# Patient Record
Sex: Male | Born: 1935 | Race: White | Hispanic: No | Smoking: Former smoker
Health system: Southern US, Community
[De-identification: ages and names within clinical notes are randomized; demographics above are authoritative.]

## PROBLEM LIST (undated history)

## (undated) DIAGNOSIS — I10 Essential (primary) hypertension: Secondary | ICD-10-CM

## (undated) DIAGNOSIS — J189 Pneumonia, unspecified organism: Secondary | ICD-10-CM

## (undated) DIAGNOSIS — M47816 Spondylosis without myelopathy or radiculopathy, lumbar region: Secondary | ICD-10-CM

## (undated) DIAGNOSIS — R32 Unspecified urinary incontinence: Secondary | ICD-10-CM

## (undated) DIAGNOSIS — I714 Abdominal aortic aneurysm, without rupture, unspecified: Secondary | ICD-10-CM

## (undated) DIAGNOSIS — M1990 Unspecified osteoarthritis, unspecified site: Secondary | ICD-10-CM

## (undated) DIAGNOSIS — I701 Atherosclerosis of renal artery: Secondary | ICD-10-CM

## (undated) DIAGNOSIS — E785 Hyperlipidemia, unspecified: Secondary | ICD-10-CM

## (undated) DIAGNOSIS — I219 Acute myocardial infarction, unspecified: Secondary | ICD-10-CM

## (undated) DIAGNOSIS — J449 Chronic obstructive pulmonary disease, unspecified: Secondary | ICD-10-CM

## (undated) DIAGNOSIS — K219 Gastro-esophageal reflux disease without esophagitis: Secondary | ICD-10-CM

## (undated) DIAGNOSIS — I739 Peripheral vascular disease, unspecified: Secondary | ICD-10-CM

## (undated) DIAGNOSIS — I350 Nonrheumatic aortic (valve) stenosis: Secondary | ICD-10-CM

## (undated) DIAGNOSIS — I2510 Atherosclerotic heart disease of native coronary artery without angina pectoris: Secondary | ICD-10-CM

## (undated) HISTORY — DX: Nonrheumatic aortic (valve) stenosis: I35.0

## (undated) HISTORY — DX: Essential (primary) hypertension: I10

## (undated) HISTORY — DX: Atherosclerosis of renal artery: I70.1

## (undated) HISTORY — DX: Acute myocardial infarction, unspecified: I21.9

## (undated) HISTORY — DX: Abdominal aortic aneurysm, without rupture, unspecified: I71.40

## (undated) HISTORY — DX: Gastro-esophageal reflux disease without esophagitis: K21.9

## (undated) HISTORY — DX: Hyperlipidemia, unspecified: E78.5

## (undated) HISTORY — DX: Chronic obstructive pulmonary disease, unspecified: J44.9

## (undated) HISTORY — DX: Atherosclerotic heart disease of native coronary artery without angina pectoris: I25.10

## (undated) HISTORY — DX: Spondylosis without myelopathy or radiculopathy, lumbar region: M47.816

## (undated) HISTORY — PX: CATARACT EXTRACTION W/ INTRAOCULAR LENS  IMPLANT, BILATERAL: SHX1307

## (undated) HISTORY — DX: Peripheral vascular disease, unspecified: I73.9

## (undated) HISTORY — DX: Abdominal aortic aneurysm, without rupture: I71.4

## (undated) HISTORY — DX: Unspecified urinary incontinence: R32

---
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
## 1966-04-10 HISTORY — PX: VASECTOMY: SHX75

## 1996-04-10 DIAGNOSIS — I219 Acute myocardial infarction, unspecified: Secondary | ICD-10-CM

## 1996-04-10 HISTORY — DX: Acute myocardial infarction, unspecified: I21.9

## 1996-05-11 HISTORY — PX: CORONARY ANGIOPLASTY WITH STENT PLACEMENT: SHX49

## 2002-04-11 ENCOUNTER — Ambulatory Visit (HOSPITAL_COMMUNITY): Admission: RE | Admit: 2002-04-11 | Discharge: 2002-04-11 | Payer: Self-pay | Admitting: Cardiology

## 2002-12-16 ENCOUNTER — Encounter: Payer: Self-pay | Admitting: Emergency Medicine

## 2002-12-16 ENCOUNTER — Encounter: Admission: RE | Admit: 2002-12-16 | Discharge: 2002-12-16 | Payer: Self-pay | Admitting: Emergency Medicine

## 2004-03-11 ENCOUNTER — Ambulatory Visit: Payer: Self-pay | Admitting: Cardiology

## 2004-05-03 ENCOUNTER — Ambulatory Visit: Payer: Self-pay | Admitting: Cardiology

## 2005-04-12 ENCOUNTER — Ambulatory Visit: Payer: Self-pay | Admitting: Cardiology

## 2005-04-18 ENCOUNTER — Ambulatory Visit: Payer: Self-pay

## 2005-04-18 ENCOUNTER — Ambulatory Visit: Payer: Self-pay | Admitting: Cardiology

## 2006-04-13 ENCOUNTER — Ambulatory Visit: Payer: Self-pay | Admitting: Cardiology

## 2006-05-02 ENCOUNTER — Encounter: Payer: Self-pay | Admitting: Cardiovascular Disease

## 2006-05-02 ENCOUNTER — Ambulatory Visit: Payer: Self-pay

## 2007-04-22 ENCOUNTER — Ambulatory Visit: Payer: Self-pay | Admitting: Cardiology

## 2007-05-10 ENCOUNTER — Ambulatory Visit: Payer: Self-pay | Admitting: Cardiology

## 2007-05-10 ENCOUNTER — Ambulatory Visit: Payer: Self-pay

## 2007-05-10 LAB — CONVERTED CEMR LAB
ALT: 24 units/L (ref 0–53)
AST: 26 units/L (ref 0–37)
Albumin: 3.8 g/dL (ref 3.5–5.2)
Alkaline Phosphatase: 67 units/L (ref 39–117)
BUN: 23 mg/dL (ref 6–23)
Bilirubin, Direct: 0.1 mg/dL (ref 0.0–0.3)
CO2: 28 meq/L (ref 19–32)
Calcium: 9.4 mg/dL (ref 8.4–10.5)
Chloride: 110 meq/L (ref 96–112)
Cholesterol: 118 mg/dL (ref 0–200)
Creatinine, Ser: 1 mg/dL (ref 0.4–1.5)
GFR calc Af Amer: 94 mL/min
GFR calc non Af Amer: 78 mL/min
Glucose, Bld: 106 mg/dL — ABNORMAL HIGH (ref 70–99)
HDL: 40 mg/dL (ref >39.0)
LDL Cholesterol: 66 mg/dL (ref 0–99)
Potassium: 4.6 meq/L (ref 3.5–5.1)
Sodium: 142 meq/L (ref 135–145)
Total Bilirubin: 0.8 mg/dL (ref 0.3–1.2)
Total CHOL/HDL Ratio: 3
Total Protein: 6.8 g/dL (ref 6.0–8.3)
Triglycerides: 61 mg/dL (ref 0–149)
VLDL: 12 mg/dL (ref 0–40)

## 2008-05-25 ENCOUNTER — Ambulatory Visit: Payer: Self-pay

## 2008-05-25 ENCOUNTER — Encounter: Payer: Self-pay | Admitting: Cardiology

## 2008-05-25 ENCOUNTER — Ambulatory Visit: Payer: Self-pay | Admitting: Cardiology

## 2008-05-25 LAB — CONVERTED CEMR LAB
ALT: 29 units/L (ref 0–53)
AST: 31 units/L (ref 0–37)
Albumin: 3.8 g/dL (ref 3.5–5.2)
Alkaline Phosphatase: 57 units/L (ref 39–117)
BUN: 20 mg/dL (ref 6–23)
Bilirubin, Direct: 0.1 mg/dL (ref 0.0–0.3)
CO2: 27 meq/L (ref 19–32)
Calcium: 9.3 mg/dL (ref 8.4–10.5)
Chloride: 110 meq/L (ref 96–112)
Cholesterol: 114 mg/dL (ref 0–200)
Creatinine, Ser: 1 mg/dL (ref 0.4–1.5)
GFR calc Af Amer: 94 mL/min
GFR calc non Af Amer: 77 mL/min
Glucose, Bld: 108 mg/dL — ABNORMAL HIGH (ref 70–99)
HDL: 31.5 mg/dL — ABNORMAL LOW (ref >39.0)
LDL Cholesterol: 64 mg/dL (ref 0–99)
Potassium: 4.2 meq/L (ref 3.5–5.1)
Sodium: 144 meq/L (ref 135–145)
Total Bilirubin: 0.9 mg/dL (ref 0.3–1.2)
Total CHOL/HDL Ratio: 3.6
Total Protein: 6.7 g/dL (ref 6.0–8.3)
Triglycerides: 94 mg/dL (ref 0–149)
VLDL: 19 mg/dL (ref 0–40)

## 2008-06-01 ENCOUNTER — Encounter: Payer: Self-pay | Admitting: Cardiology

## 2008-06-01 ENCOUNTER — Ambulatory Visit: Payer: Self-pay

## 2008-08-31 ENCOUNTER — Emergency Department (HOSPITAL_COMMUNITY): Admission: EM | Admit: 2008-08-31 | Discharge: 2008-09-01 | Payer: Self-pay | Admitting: Emergency Medicine

## 2008-10-07 ENCOUNTER — Telehealth (INDEPENDENT_AMBULATORY_CARE_PROVIDER_SITE_OTHER): Payer: Self-pay | Admitting: *Deleted

## 2009-02-24 ENCOUNTER — Encounter (INDEPENDENT_AMBULATORY_CARE_PROVIDER_SITE_OTHER): Payer: Self-pay | Admitting: *Deleted

## 2009-05-18 DIAGNOSIS — I10 Essential (primary) hypertension: Secondary | ICD-10-CM | POA: Insufficient documentation

## 2009-05-18 DIAGNOSIS — I701 Atherosclerosis of renal artery: Secondary | ICD-10-CM | POA: Insufficient documentation

## 2009-05-18 DIAGNOSIS — R0609 Other forms of dyspnea: Secondary | ICD-10-CM | POA: Insufficient documentation

## 2009-05-18 DIAGNOSIS — E785 Hyperlipidemia, unspecified: Secondary | ICD-10-CM | POA: Insufficient documentation

## 2009-05-18 DIAGNOSIS — J40 Bronchitis, not specified as acute or chronic: Secondary | ICD-10-CM | POA: Insufficient documentation

## 2009-05-18 DIAGNOSIS — R0600 Dyspnea, unspecified: Secondary | ICD-10-CM | POA: Insufficient documentation

## 2009-05-18 DIAGNOSIS — K219 Gastro-esophageal reflux disease without esophagitis: Secondary | ICD-10-CM | POA: Insufficient documentation

## 2009-05-18 DIAGNOSIS — I2510 Atherosclerotic heart disease of native coronary artery without angina pectoris: Secondary | ICD-10-CM | POA: Insufficient documentation

## 2009-05-18 DIAGNOSIS — R0602 Shortness of breath: Secondary | ICD-10-CM

## 2009-05-18 DIAGNOSIS — I359 Nonrheumatic aortic valve disorder, unspecified: Secondary | ICD-10-CM | POA: Insufficient documentation

## 2009-05-18 DIAGNOSIS — Z9861 Coronary angioplasty status: Secondary | ICD-10-CM

## 2009-05-18 DIAGNOSIS — E7800 Pure hypercholesterolemia, unspecified: Secondary | ICD-10-CM | POA: Insufficient documentation

## 2009-05-18 DIAGNOSIS — I219 Acute myocardial infarction, unspecified: Secondary | ICD-10-CM | POA: Insufficient documentation

## 2009-05-18 DIAGNOSIS — I739 Peripheral vascular disease, unspecified: Secondary | ICD-10-CM | POA: Insufficient documentation

## 2009-05-19 ENCOUNTER — Encounter: Payer: Self-pay | Admitting: Cardiology

## 2009-05-19 ENCOUNTER — Encounter (INDEPENDENT_AMBULATORY_CARE_PROVIDER_SITE_OTHER): Payer: Self-pay | Admitting: *Deleted

## 2009-05-19 ENCOUNTER — Ambulatory Visit: Payer: Self-pay | Admitting: Cardiology

## 2009-05-20 ENCOUNTER — Encounter (INDEPENDENT_AMBULATORY_CARE_PROVIDER_SITE_OTHER): Payer: Self-pay | Admitting: *Deleted

## 2009-05-21 ENCOUNTER — Ambulatory Visit: Payer: Self-pay | Admitting: Cardiology

## 2009-05-21 ENCOUNTER — Inpatient Hospital Stay (HOSPITAL_BASED_OUTPATIENT_CLINIC_OR_DEPARTMENT_OTHER): Admission: RE | Admit: 2009-05-21 | Discharge: 2009-05-21 | Payer: Self-pay | Admitting: Cardiology

## 2009-05-21 ENCOUNTER — Ambulatory Visit: Payer: Self-pay | Admitting: Cardiovascular Disease

## 2009-05-21 ENCOUNTER — Inpatient Hospital Stay (HOSPITAL_COMMUNITY): Admission: AD | Admit: 2009-05-21 | Discharge: 2009-05-22 | Payer: Self-pay | Admitting: Cardiology

## 2009-05-26 ENCOUNTER — Encounter: Payer: Self-pay | Admitting: Cardiology

## 2009-06-15 ENCOUNTER — Telehealth: Payer: Self-pay | Admitting: Cardiology

## 2009-07-02 ENCOUNTER — Ambulatory Visit: Payer: Self-pay | Admitting: Cardiology

## 2009-07-16 ENCOUNTER — Ambulatory Visit: Payer: Self-pay

## 2009-07-16 ENCOUNTER — Encounter: Payer: Self-pay | Admitting: Cardiology

## 2010-05-08 LAB — CONVERTED CEMR LAB
ALT: 23 units/L (ref 0–53)
AST: 26 units/L (ref 0–37)
Albumin: 4 g/dL (ref 3.5–5.2)
Alkaline Phosphatase: 62 units/L (ref 39–117)
BUN: 24 mg/dL — ABNORMAL HIGH (ref 6–23)
Basophils Absolute: 0.1 10*3/uL (ref 0.0–0.1)
Basophils Relative: 0.6 % (ref 0.0–3.0)
Bilirubin, Direct: 0 mg/dL (ref 0.0–0.3)
CO2: 27 meq/L (ref 19–32)
Calcium: 9.4 mg/dL (ref 8.4–10.5)
Chloride: 108 meq/L (ref 96–112)
Cholesterol: 122 mg/dL (ref 0–200)
Creatinine, Ser: 1.2 mg/dL (ref 0.4–1.5)
Eosinophils Absolute: 0.3 10*3/uL (ref 0.0–0.7)
Eosinophils Relative: 2.8 % (ref 0.0–5.0)
GFR calc non Af Amer: 62.46 mL/min (ref >60)
Glucose, Bld: 98 mg/dL (ref 70–99)
HCT: 43.4 % (ref 39.0–52.0)
HDL: 46.8 mg/dL (ref >39.00)
Hemoglobin: 14.4 g/dL (ref 13.0–17.0)
INR: 1 (ref 0.8–1.0)
LDL Cholesterol: 49 mg/dL (ref 0–99)
Lymphocytes Relative: 33.8 % (ref 12.0–46.0)
Lymphs Abs: 3.1 10*3/uL (ref 0.7–4.0)
MCHC: 33.1 g/dL (ref 30.0–36.0)
MCV: 91.7 fL (ref 78.0–100.0)
Monocytes Absolute: 0.9 10*3/uL (ref 0.1–1.0)
Monocytes Relative: 9.6 % (ref 3.0–12.0)
Neutro Abs: 4.8 10*3/uL (ref 1.4–7.7)
Neutrophils Relative %: 53.2 % (ref 43.0–77.0)
Platelets: 207 10*3/uL (ref 150.0–400.0)
Potassium: 4 meq/L (ref 3.5–5.1)
Pro B Natriuretic peptide (BNP): 56 pg/mL (ref 0.0–100.0)
Prothrombin Time: 10.5 s (ref 9.1–11.7)
RBC: 4.73 M/uL (ref 4.22–5.81)
RDW: 13.7 % (ref 11.5–14.6)
Sodium: 141 meq/L (ref 135–145)
Total Bilirubin: 0.5 mg/dL (ref 0.3–1.2)
Total CHOL/HDL Ratio: 3
Total Protein: 6.9 g/dL (ref 6.0–8.3)
Triglycerides: 129 mg/dL (ref 0.0–149.0)
VLDL: 25.8 mg/dL (ref 0.0–40.0)
WBC: 9.2 10*3/uL (ref 4.5–10.5)

## 2010-05-12 NOTE — Progress Notes (Signed)
Summary: REFILL FOR PLAVIX  Medications Added PLAVIX 75 MG TABS (CLOPIDOGREL BISULFATE) Take one tablet by mouth daily       Phone Note Refill Request Call back at Home Phone [phone] Message from:  Patient on June 15, 2009 11:21 AM  Refills Requested: Medication #1:  PLAVIX 75MG   Medication #2:  [facility] OMEPRAZOLE 20 MG TBEC 1 tab by mouth once daily [facility] [phone]  Initial call taken by: Judie Grieve,  June 15, 2009 11:22 AM Caller: Patient    New/Updated Medications: PLAVIX 75 MG TABS (CLOPIDOGREL BISULFATE) Take one tablet by mouth daily Prescriptions: [facility] OMEPRAZOLE 20 MG TBEC (OMEPRAZOLE) 1 tab by mouth once daily  #90 x 3   Entered by:   Kem Parkinson   Authorized by:   Ferman Hamming, MD, [facility]   Signed by:   Kem Parkinson on 06/15/2009   Method used:   Faxed to ...       [facility] Environmental education officer)       [address] [address]       [place], [place]  [address]       Ph: [phone]       Fax: [phone]   RxID:   [id number] PLAVIX 75 MG TABS (CLOPIDOGREL BISULFATE) Take one tablet by mouth daily  #90 x 3   Entered by:   Kem Parkinson   Authorized by:   Ferman Hamming, MD, [facility]   Signed by:   Kem Parkinson on 06/15/2009   Method used:   Faxed to ...       [facility] Environmental education officer)       [address] [address]       [place], [place]  [address]       Ph: [phone]       Fax: [phone]   RxID:   [id number]

## 2010-05-12 NOTE — Assessment & Plan Note (Signed)
Summary: Cardiology Office Visit     Vital Signs:  Patient Profile:   77 Years Old Male Weight:      169 pounds BP sitting:   153 / 77  (right arm)  Vitals Entered By: Kem Parkinson (May 25, 2008 9:18 AM)                  Medications Added to Medication List This Visit: 1)  Isosorbide Mononitrate Cr 30 Mg Xr24h-tab (Isosorbide mononitrate) .Marland Kitchen.. 1 tab by mouth once daily 2)  Crestor 40 Mg Tabs (Rosuvastatin calcium) .Marland Kitchen.. 1 tab by mouth once daily 3)  Altace 10 Mg Tabs (Ramipril) .Marland Kitchen.. 1 tab by mouth once daily 4)  Flomax 0.4 Mg Xr24h-cap (Tamsulosin hcl) .Marland Kitchen.. 1 tab by mouth once daily 5)  Multivitamins Tabs (Multiple vitamin) .Marland Kitchen.. 1 tab by mouth once daily 6)  Fish Oil Oil (Fish oil) .Marland Kitchen.. 1 tab by mouth once daily 7)  [facility] Omeprazole 20 Mg Tbec (Omeprazole) .Marland Kitchen.. 1 tab by mouth once daily 8)  Zetia 10 Mg Tabs (Ezetimibe) .Marland Kitchen.. 1 tab by mouth once daily

## 2010-05-12 NOTE — Letter (Signed)
Summary: Custom - Lipid  [facility], Main Office  [address]   [place], [place] [address]   Phone: [phone]  Fax: [phone]     May 20, 2009 MRN: [id number]   [facility] [place] [address] [place], [place]  [address]   Dear Mr. Canton,  We have reviewed your cholesterol results.  They are as follows:     Total Cholesterol:    122 (Desirable: less than 200)       HDL  Cholesterol:     46.80  (Desirable: greater than 40 for men and 50 for women)       LDL Cholesterol:       49  (Desirable: less than 100 for low risk and less than 70 for moderate to high risk)       Triglycerides:       129.0  (Desirable: less than 150)  Our recommendations include:These numbers look good. Continue on the same medicine. Sodium,. potassium, blood count, kidney and Liver function are normal. Take care, Dr. Darel Hong.    Call our office at the number listed above if you have any questions.  Lowering your LDL cholesterol is important, but it is only one of a large number of "risk factors" that may indicate that you are at risk for heart disease, stroke or other complications of hardening of the arteries.  Other risk factors include:   A.  Cigarette Smoking* B.  High Blood Pressure* C.  Obesity* D.   Low HDL Cholesterol (see yours above)* E.   Diabetes Mellitus (higher risk if your is uncontrolled) F.  Family history of premature heart disease G.  Previous history of stroke or cardiovascular disease    *These are risk factors YOU HAVE CONTROL OVER.  For more information, visit .  There is now evidence that lowering the TOTAL CHOLESTEROL AND LDL CHOLESTEROL can reduce the risk of heart disease.  The American Heart Association recommends the following guidelines for the treatment of elevated cholesterol:  1.  If there is now current heart disease and less than two risk factors, TOTAL CHOLESTEROL should be less than 200 and LDL CHOLESTEROL should be less than 100. 2.   If there is current heart disease or two or more risk factors, TOTAL CHOLESTEROL should be less than 200 and LDL CHOLESTEROL should be less than 70.  A diet low in cholesterol, saturated fat, and calories is the cornerstone of treatment for elevated cholesterol.  Cessation of smoking and exercise are also important in the management of elevated cholesterol and preventing vascular disease.  Studies have shown that 30 to 60 minutes of physical activity most days can help lower blood pressure, lower cholesterol, and keep your weight at a healthy level.  Drug therapy is used when cholesterol levels do not respond to therapeutic lifestyle changes (smoking cessation, diet, and exercise) and remains unacceptably high.  If medication is started, it is important to have you levels checked periodically to evaluate the need for further treatment options.  Thank you,    [facility] Team

## 2010-05-12 NOTE — Assessment & Plan Note (Signed)
Summary: 6wks f/u sl    CC:  follow up.  History of Present Illness: [name] is a pleasant  gentleman who has a history of coronary artery disease, status post PCI of his LAD performed in February 1998. Last Myoview was performed in February of 2010. His ejection fraction of 55%. There is a small prior inferior basal infarct but no ischemia.  An echocardiogram at that time revealed normal LV function. There was mild aortic stenosis by report with a mean gradient of 7 mm of mercury. Abdominal ultrasound in February of 2010 revealed an aneurysm of 2.5 x 2.5 cm. 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 He had normal renal arteries bilaterally status post stents. Followup was recommended in one year.I last saw him in Feb 2011.  He was complaining of dyspnea at that time and we scheduled a cardiac catheterization. Note a BNP was normal. The patient's cardiac catheterization was scheduled in February and showed an ejection fraction of 60% and no renal artery stenosis. He had a 90% right coronary artery but no obstructive disease in the left system. He had PCI of his right coronary artery with a drug-eluting stent at that time. Since then the patient denies any dyspnea on exertion, orthopnea, PND, pedal edema, palpitations, syncope or chest pain.   Current Medications (verified): 1)  Crestor 40 Mg Tabs (Rosuvastatin Calcium) .Marland Kitchen.. 1 Tab By Mouth Once Daily 2)  Altace 10 Mg Tabs (Ramipril) .Marland Kitchen.. 1 Tab By Mouth Once Daily 3)  Multivitamins  Tabs (Multiple Vitamin) .Marland Kitchen.. 1 Tab By Mouth Once Daily 4)  Fish Oil  Oil (Fish Oil) .Marland Kitchen.. 1 Tab By Mouth Once Daily 5)  [facility] Omeprazole 20 Mg Tbec (Omeprazole) .Marland Kitchen.. 1 Tab By Mouth Once Daily 6)  Zetia 10 Mg Tabs (Ezetimibe) .Marland Kitchen.. 1 Tab By Mouth Once Daily 7)  Aspirin Ec 325 Mg Tbec (Aspirin) .... Take One Tablet By Mouth Daily 8)  Vitamin C 500 Mg Tabs (Ascorbic Acid) .... 2 Tab By Mouth Once Daily 9)  Vitamin E 400 Unit Caps (Vitamin E) .Marland Kitchen.. 1  Tab By Mouth Once Daily 10)  Vitamin D3 1000 Unit Caps (Cholecalciferol) .Marland Kitchen.. 1 Tab By Mouth Once Daily 11)  Ginkoba 40 Mg Tabs (Ginkgo Biloba) .Marland Kitchen.. 1 Tab By Mouth Once Daily 12)  Co Q-10 30 Mg  Caps (Coenzyme Q10) .... Tab By Mouth Once Daily 13)  Plavix 75 Mg Tabs (Clopidogrel Bisulfate) .... Take One Tablet By Mouth Daily  Past History:  Past Medical History: Reviewed history from 05/19/2009 and no changes required. Current Problems:  MI (ICD-410.90) HYPERTENSION (ICD-401.9) CAD (ICD-414.00) AORTIC STENOSIS (ICD-424.1) HYPERLIPIDEMIA (ICD-272.4) PVD (ICD-443.9) RENAL ARTERY STENOSIS (ICD-440.1) HYPERCHOLESTEROLEMIA (ICD-272.0) GERD (ICD-530.81)  Social History: Reviewed history from 05/18/2009 and no changes required. non-smoker, non-drinker, no drug abuse   Review of Systems       no fevers or chills, productive cough, hemoptysis, dysphasia, odynophagia, melena, hematochezia, dysuria, hematuria, rash, seizure activity, orthopnea, PND, pedal edema, claudication. Remaining systems are negative.   Vital Signs:  Patient profile:   77 year old male Height:      68 inches Weight:      174 pounds BMI:     26.55 Pulse rate:   65 / minute Resp:     12 per minute BP sitting:   120 / 70  (left arm)  Vitals Entered By: Kem Parkinson (July 02, 2009 8:06 AM)  Physical Exam  General:  Well-developed well-nourished in no acute distress.  Skin is warm and dry.  HEENT is normal.  Neck is supple. No thyromegaly.  Chest is clear to auscultation with normal expansion.  Cardiovascular exam is regular rate and rhythm. 2/6 systolic murmur left sternal border. Abdominal exam nontender or distended. No masses palpated. right groin with positive bruit. Extremities show no edema. neuro grossly intact    Impression & Recommendations:  Problem # 1:  CAD (ICD-414.00) Status post drug-eluting stent to the right coronary artery. Continue aspirin, Plavix, ACE inhibitor and statin.  Patient with right femoral bruit. Check Dopplers. His updated medication list for this problem includes:    Altace 10 Mg Tabs (Ramipril) .Marland Kitchen... 1 tab by mouth once daily    Aspirin Ec 325 Mg Tbec (Aspirin) .Marland Kitchen... Take one tablet by mouth daily    Plavix 75 Mg Tabs (Clopidogrel bisulfate) .Marland Kitchen... Take one tablet by mouth daily  Orders: Arterial Duplex Lower Extremity (Arterial Duplex Low) Abdominal Aorta Duplex (Abd Aorta Duplex)  Problem # 2:  AORTIC STENOSIS (ICD-424.1) Followup echocardiogram when he returns in 6 months. His updated medication list for this problem includes:    Altace 10 Mg Tabs (Ramipril) .Marland Kitchen... 1 tab by mouth once daily  Problem # 3:  HYPERTENSION (ICD-401.9) Blood pressure controlled on present medications. Will continue. His updated medication list for this problem includes:    Altace 10 Mg Tabs (Ramipril) .Marland Kitchen... 1 tab by mouth once daily    Aspirin Ec 325 Mg Tbec (Aspirin) .Marland Kitchen... Take one tablet by mouth daily  Problem # 4:  HYPERLIPIDEMIA (ICD-272.4) Continue present medications. His updated medication list for this problem includes:    Crestor 40 Mg Tabs (Rosuvastatin calcium) .Marland Kitchen... 1 tab by mouth once daily    Zetia 10 Mg Tabs (Ezetimibe) .Marland Kitchen... 1 tab by mouth once daily  Problem # 5:  PVD (ICD-443.9) Repeat abdominal ultrasound for aneurysm. Recent catheterization showed no renal artery stenosis.  Problem # 6:  RENAL ARTERY STENOSIS (ICD-440.1)  Problem # 7:  GERD (ICD-530.81)  His updated medication list for this problem includes:    [facility] Omeprazole 20 Mg Tbec (Omeprazole) .Marland Kitchen... 1 tab by mouth once daily  Patient Instructions: 1)  Your physician recommends that you schedule a follow-up appointment in: 6 months 2)  Your physician has requested that you have an abdominal aorta duplex. During this test, an ultrasound is used to evaluate the aorta. Allow 30 minutes for this exam. Do not eat after midnight the day before and avoid carbonated beverages. There are  no restrictions or special instructions. 3)  Your physician has requested that you have a lower or upper extremity arterial duplex.  This test is an ultrasound of the arteries in the legs or arms.  It looks at arterial blood flow in the legs and arms.  Allow one hour for Lower and Upper Arterial scans. There are no restrictions or special instructions.

## 2010-05-12 NOTE — Assessment & Plan Note (Signed)
Summary: 1 YR F/U  Medications Added ASPIRIN EC 325 MG TBEC (ASPIRIN) Take one tablet by mouth daily VITAMIN C 500 MG TABS (ASCORBIC ACID) 2 tab by mouth once daily VITAMIN E 400 UNIT CAPS (VITAMIN E) 1 tab by mouth once daily VITAMIN D3 1000 UNIT CAPS (CHOLECALCIFEROL) 1 tab by mouth once daily GINKOBA 40 MG TABS (GINKGO BILOBA) 1 tab by mouth once daily CO Q-10 30 MG  CAPS (COENZYME Q10) tab by mouth once daily PROSTAMEN  CAPS (NUTRITIONAL SUPPLEMENTS) 2 daily        CC:  sob.  History of Present Illness: Kristopher [name] is a pleasant  gentleman who has a history of coronary artery disease, status post PCI of his LAD performed in February 1998. Last Myoview was performed in February of 2010. His ejection fraction of 55%. There is a small prior inferior basal infarct but no ischemia.  An echocardiogram at that time revealed normal LV function. There was mild aortic stenosis by report with a mean gradient of 7 mm of mercury. Abdominal ultrasound in February of 2010 revealed an aneurysm of 2.5 x 2.5 cm. 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 He had normal renal arteries bilaterally status post stents. Followup was recommended in one year.I last saw him in February of 2010.  Since that time, he describes progressive dyspnea on exertion relieved with rest. There is no orthopnea, PND or pedal edema. He also has a vague pain with exertion under the left breast. He finds it difficult to describe. There is associated diaphoresis. There is no nausea. Resolves with rest. He has had no rest symptoms. He states he thinks that the arteries are "closing up". There is no productive cough or hemoptysis.  Current Medications (verified): 1)  Isosorbide Mononitrate Cr 30 Mg Xr24h-Tab (Isosorbide Mononitrate) .Marland Kitchen.. 1 Tab By Mouth Once Daily 2)  Crestor 40 Mg Tabs (Rosuvastatin Calcium) .Marland Kitchen.. 1 Tab By Mouth Once Daily 3)  Altace 10 Mg Tabs (Ramipril) .Marland Kitchen.. 1 Tab By Mouth Once Daily 4)   Multivitamins  Tabs (Multiple Vitamin) .Marland Kitchen.. 1 Tab By Mouth Once Daily 5)  Fish Oil  Oil (Fish Oil) .Marland Kitchen.. 1 Tab By Mouth Once Daily 6)  [facility]vs Omeprazole 20 Mg Tbec (Omeprazole) .Marland Kitchen.. 1 Tab By Mouth Once Daily 7)  Zetia 10 Mg Tabs (Ezetimibe) .Marland Kitchen.. 1 Tab By Mouth Once Daily 8)  Aspirin Ec 325 Mg Tbec (Aspirin) .... Take One Tablet By Mouth Daily 9)  Vitamin C 500 Mg Tabs (Ascorbic Acid) .... 2 Tab By Mouth Once Daily 10)  Vitamin E 400 Unit Caps (Vitamin E) .Marland Kitchen.. 1 Tab By Mouth Once Daily 11)  Vitamin D3 1000 Unit Caps (Cholecalciferol) .Marland Kitchen.. 1 Tab By Mouth Once Daily 12)  Ginkoba 40 Mg Tabs (Ginkgo Biloba) .Marland Kitchen.. 1 Tab By Mouth Once Daily 13)  Co Q-10 30 Mg  Caps (Coenzyme Q10) .... Tab By Mouth Once Daily 14)  Prostamen  Caps (Nutritional Supplements) .... 2 Daily  Past History:  Past Medical History: Current Problems:  MI (ICD-410.90) HYPERTENSION (ICD-401.9) CAD (ICD-414.00) AORTIC STENOSIS (ICD-424.1) HYPERLIPIDEMIA (ICD-272.4) PVD (ICD-443.9) RENAL ARTERY STENOSIS (ICD-440.1) HYPERCHOLESTEROLEMIA (ICD-272.0) GERD (ICD-530.81)  Social History: Reviewed history from 05/18/2009 and no changes required. non-smoker, non-drinker, no drug abuse   Review of Systems       no fevers or chills, productive cough, hemoptysis, dysphasia, odynophagia, melena, hematochezia, dysuria, hematuria, rash, seizure activity, orthopnea, PND, pedal edema, claudication. Remaining systems are negative.   Vital Signs:  Patient profile:  77 year old male Height:      68 inches Weight:      173 pounds BMI:     26.40 Pulse rate:   66 / minute Resp:     12 per minute BP sitting:   105 / 66  (left arm)  Vitals Entered By: Kristopher [name] (May 19, 2009 9:51 AM)  Physical Exam  General:  Well-developed well-nourished in no acute distress.  Skin is warm and dry.  HEENT is normal.  Neck is supple. No thyromegaly.  Chest is diminished breath sounds throughout Cardiovascular exam is regular rate and  rhythm.  Abdominal exam nontender or distended. No masses palpated. 1+ femoral pulses bilaterally. Extremities show no edema. 2+ posterior tibial pulse on the right and 1+ on the left. neuro grossly intact    EKG  Procedure date:  05/19/2009  Findings:      Normal sinus rhythm at a rate of 66. No ST changes.  Impression & Recommendations:  Problem # 1:  DYSPNEA (ICD-786.05) Continues to complain of dyspnea. Also complains of vague chest pain with exertion relieved with rest. His symptoms are concerning. I will schedule a right and left heart catheterization. The risks and benefits have been discussed and he agrees to proceed. I am concerned about his symptoms being angina. I will also check a BNP and a chest x-ray. Certainly COPD could be contributing to the above as well. His updated medication list for this problem includes:    Altace 10 Mg Tabs (Ramipril) .Marland Kitchen... 1 tab by mouth once daily    Aspirin Ec 325 Mg Tbec (Aspirin) .Marland Kitchen... Take one tablet by mouth daily  Orders: TLB-BNP (B-Natriuretic Peptide) (83880-BNPR) TLB-CBC Platelet - w/Differential (85025-CBCD) T-2 View CXR (71020TC)  Problem # 2:  HYPERTENSION (ICD-401.9) Blood pressure controlled on present medications. Will continue. Check bmet. His updated medication list for this problem includes:    Altace 10 Mg Tabs (Ramipril) .Marland Kitchen... 1 tab by mouth once daily    Aspirin Ec 325 Mg Tbec (Aspirin) .Marland Kitchen... Take one tablet by mouth daily  Orders: TLB-BMP (Basic Metabolic Panel-BMET) (80048-METABOL)  Problem # 3:  CAD (ICD-414.00) Continue aspirin, ACE inhibitor and statin. His updated medication list for this problem includes:    Isosorbide Mononitrate Cr 30 Mg Xr24h-tab (Isosorbide mononitrate) .Marland Kitchen... 1 tab by mouth once daily    Altace 10 Mg Tabs (Ramipril) .Marland Kitchen... 1 tab by mouth once daily    Aspirin Ec 325 Mg Tbec (Aspirin) .Marland Kitchen... Take one tablet by mouth daily  Orders: EKG w/ Interpretation (93000) TLB-PT (Protime)  (85610-PTP)  Problem # 4:  AORTIC STENOSIS (ICD-424.1) His aortic stenosis and does not sound as though it has progressed on examination. We will plan followup echocardiogram in one year. His updated medication list for this problem includes:    Isosorbide Mononitrate Cr 30 Mg Xr24h-tab (Isosorbide mononitrate) .Marland Kitchen... 1 tab by mouth once daily    Altace 10 Mg Tabs (Ramipril) .Marland Kitchen... 1 tab by mouth once daily  Problem # 5:  PVD (ICD-443.9) No significant claudication. Continue aspirin and statin.  Problem # 6:  RENAL ARTERY STENOSIS (ICD-440.1) Status post bilateral stents. Will arrange followup abdominal ultrasound to assess this as well as a small abdominal aortic aneurysm.  Problem # 7:  HYPERLIPIDEMIA (ICD-272.4) Continue present medications. Check lipids and liver. His updated medication list for this problem includes:    Crestor 40 Mg Tabs (Rosuvastatin calcium) .Marland Kitchen... 1 tab by mouth once daily    Zetia 10 Mg  Tabs (Ezetimibe) .Marland Kitchen... 1 tab by mouth once daily  Orders: TLB-Lipid Panel (80061-LIPID) TLB-Hepatic/Liver Function Pnl (80076-HEPATIC)  Other Orders: Cardiac Catheterization (Cardiac Cath)  Patient Instructions: 1)  Your physician recommends that you schedule a follow-up appointment in: 6 WEEKS 2)  Your physician has requested that you have an abdominal aorta duplex. During this test, an ultrasound is used to evaluate the aorta. Allow 30 minutes for this exam. Do not eat after midnight the day before and avoid carbonated beverages. There are no restrictions or special instructions.05/31/09 @9 :30AM 3)  Your physician has requested that you have a cardiac catheterization.  Cardiac catheterization is used to diagnose and/or treat various heart conditions. Doctors may recommend this procedure for a number of different reasons. The most common reason is to evaluate chest pain. Chest pain can be a symptom of coronary artery disease (CAD), and cardiac catheterization can show whether  plaque is narrowing or blocking your heart's arteries. This procedure is also used to evaluate the valves, as well as measure the blood flow and oxygen levels in different parts of your heart.  For further information please visit [email or web address]  Please follow instruction sheet, as given. Prescriptions: ZETIA 10 MG TABS (EZETIMIBE) 1 tab by mouth once daily  #90 x 3   Entered by:   Kristopher [name]   Authorized by:   Ferman Hamming, MD, [facility]   Signed by:   Kristopher [name] on 05/19/2009   Method used:   Faxed to ...       [facility] Environmental education officer)       [address] [address]       [place], [place]  [address]       Ph: [phone]       Fax: [phone]   RxID:   [id number] ALTACE 10 MG TABS (RAMIPRIL) 1 tab by mouth once daily  #90 x 3   Entered by:   Kristopher [name]   Authorized by:   Ferman Hamming, MD, [facility]   Signed by:   Kristopher [name] on 05/19/2009   Method used:   Faxed to ...       [facility] Environmental education officer)       [address] [address]       [place], [place]  [address]       Ph: [phone]       Fax: [phone]   RxID:   [id number] CRESTOR 40 MG TABS (ROSUVASTATIN CALCIUM) 1 tab by mouth once daily  #90 x 3   Entered by:   Kristopher [name]   Authorized by:   Ferman Hamming, MD, [facility]   Signed by:   Kristopher [name] on 05/19/2009   Method used:   Faxed to ...       [facility] Environmental education officer)       [address] [address]       [place], [place]  [address]       Ph: [phone]       Fax: [phone]   RxID:   [id number] ISOSORBIDE MONONITRATE CR 30 MG XR24H-TAB (ISOSORBIDE MONONITRATE) 1 tab by mouth once daily  #90 x 3   Entered by:   Kristopher [name]   Authorized by:   Ferman Hamming, MD, [facility]   Signed by:   Kristopher [name] on 05/19/2009   Method used:   Faxed to ...       [facility] Environmental education officer)       [address] [address]       [place], [place]  [address]  Ph: [phone]       Fax: [phone]   RxID:   [id number]

## 2010-05-12 NOTE — Letter (Signed)
Summary: Cardiac Catheterization Instructions- [facility]  [facility], Main Office  [address]   [place], [place] [address]   Phone: [phone]  Fax: [phone]     05/19/2009 MRN: [id number]  Kristopher [name] [address] [place], [place]  [address]  Dear Mr. Gadbois,   You are scheduled for a Cardiac Catheterization on FRIDAY 05-21-09 with Dr.BENSIMHON  Please arrive to the 1st floor of the Heart and Vascular Center at [facility] at    10:30 am   on the day of your procedure. Please do not arrive before 6:30 a.m. Call the Heart and Vascular Center at [phone] if you are unable to make your appointmnet. The Code to get into the parking garage under the building is 0900. Take the elevators to the 1st floor. You must have someone to drive you home. Someone must be with you for the first 24 hours after you arrive home. Please wear clothes that are easy to get on and off and wear slip-on shoes. Do not eat or drink after midnight except water with your medications that morning. Bring all your medications and current insurance cards with you.  ___ DO NOT take these medications before your procedure: ________________________________________________________________  ___ Make sure you take your aspirin.  ___ You may take ALL of your medications with water that morning. ________________________________________________________________________________________________________________________________  ___ DO NOT take ANY medications before your procedure.  ___ Pre-med instructions:  ________________________________________________________________________________________________________________________________  The usual length of stay after your procedure is 2 to 3 hours. This can vary.  If you have any questions, please call the office at the number listed above.   Deliah Goody, RN

## 2010-05-12 NOTE — Cardiovascular Report (Signed)
Summary: Pre Cath Orders  Pre Cath Orders   Imported By: Roderic Ovens 05/21/2009 16:21:27  _____________________________________________________________________  External Attachment:    Type:   Image     Comment:   External Document

## 2010-05-12 NOTE — Miscellaneous (Signed)
Summary: [facility]  [facility]   Imported By: Roderic Ovens 06/03/2009 13:03:38  _____________________________________________________________________  External Attachment:    Type:   Image     Comment:   External Document

## 2010-05-12 NOTE — Letter (Signed)
Summary: Appointment - Reminder 2  [facility]     [place], [place]    Phone:   Fax:      February 24, 2009 MRN: [id number]   [address] [facility] [place] [address] [place], [place]  [address]   Dear Kristopher [name],  Our records indicate that it is time to schedule a follow-up appointment.  Dr.Crenshaw recommended that you follow up with [place] in Feb 2011. It is very important that we reach you to schedule this appointment. We look forward to participating in your health care needs. Please contact us at the number listed above at your earliest convenience to schedule your appointment.  If you are unable to make an appointment at this time, give [place] a call so we can update our records.     Sincerely,    Lorne Skeens  [facility] Scheduling Team

## 2010-05-12 NOTE — Progress Notes (Signed)
   Phone Note Outgoing Call   Call placed by: Deliah Goody, RN,  October 07, 2008 10:49 AM Summary of Call: PT [facility] [employer] FORM SIGNED AND PLACED AT FRONT DESK FOR PICKUP/DEBRA MATHIS RN

## 2010-06-13 ENCOUNTER — Encounter (INDEPENDENT_AMBULATORY_CARE_PROVIDER_SITE_OTHER): Payer: Medicare Other

## 2010-06-13 ENCOUNTER — Encounter: Payer: Self-pay | Admitting: Cardiology

## 2010-06-13 DIAGNOSIS — I714 Abdominal aortic aneurysm, without rupture, unspecified: Secondary | ICD-10-CM

## 2010-06-13 DIAGNOSIS — I700 Atherosclerosis of aorta: Secondary | ICD-10-CM

## 2010-06-21 NOTE — Miscellaneous (Signed)
Summary: Orders Update  Clinical Lists Changes  Problems: Added new problem of AAA (ICD-441.4) Orders: Added new Test order of Abdominal Aorta Duplex (Abd Aorta Duplex) - Signed 

## 2010-06-29 LAB — CBC
HCT: 37 % — ABNORMAL LOW (ref 39.0–52.0)
Hemoglobin: 12.9 g/dL — ABNORMAL LOW (ref 13.0–17.0)
MCHC: 34.9 g/dL (ref 30.0–36.0)
MCV: 91.8 fL (ref 78.0–100.0)
Platelets: 159 10*3/uL (ref 150–400)
RBC: 4.03 MIL/uL — ABNORMAL LOW (ref 4.22–5.81)
RDW: 14.4 % (ref 11.5–15.5)
WBC: 9 10*3/uL (ref 4.0–10.5)

## 2010-06-29 LAB — BASIC METABOLIC PANEL
BUN: 18 mg/dL (ref 6–23)
CO2: 25 mEq/L (ref 19–32)
Calcium: 8.8 mg/dL (ref 8.4–10.5)
Chloride: 112 mEq/L (ref 96–112)
Creatinine, Ser: 1.08 mg/dL (ref 0.4–1.5)
GFR calc Af Amer: >60 mL/min (ref >60)
GFR calc non Af Amer: >60 mL/min (ref >60)
Glucose, Bld: 97 mg/dL (ref 70–99)
Potassium: 4.1 mEq/L (ref 3.5–5.1)
Sodium: 141 mEq/L (ref 135–145)

## 2010-06-29 LAB — POCT I-STAT 3, VENOUS BLOOD GAS (G3P V)
Acid-base deficit: 3 mmol/L — ABNORMAL HIGH (ref 0.0–2.0)
Bicarbonate: 22.4 mEq/L (ref 20.0–24.0)
O2 Saturation: 80 %
TCO2: 24 mmol/L (ref 0–100)
pCO2, Ven: 39.5 mmHg — ABNORMAL LOW (ref 45.0–50.0)
pH, Ven: 7.361 — ABNORMAL HIGH (ref 7.250–7.300)
pO2, Ven: 46 mmHg — ABNORMAL HIGH (ref 30.0–45.0)

## 2010-06-29 LAB — POCT I-STAT 3, ART BLOOD GAS (G3+)
Acid-base deficit: 3 mmol/L — ABNORMAL HIGH (ref 0.0–2.0)
Bicarbonate: 20.7 mEq/L (ref 20.0–24.0)
O2 Saturation: 91 %
TCO2: 22 mmol/L (ref 0–100)
pCO2 arterial: 33 mmHg — ABNORMAL LOW (ref 35.0–45.0)
pH, Arterial: 7.407 (ref 7.350–7.450)
pO2, Arterial: 60 mmHg — ABNORMAL LOW (ref 80.0–100.0)

## 2010-07-10 ENCOUNTER — Encounter: Payer: Self-pay | Admitting: Cardiology

## 2010-07-19 LAB — DIFFERENTIAL
Basophils Absolute: 0.1 10*3/uL (ref 0.0–0.1)
Basophils Relative: 1 % (ref 0–1)
Eosinophils Absolute: 0.1 10*3/uL (ref 0.0–0.7)
Eosinophils Relative: 1 % (ref 0–5)
Lymphocytes Relative: 29 % (ref 12–46)
Lymphs Abs: 2.3 10*3/uL (ref 0.7–4.0)
Monocytes Absolute: 1.1 10*3/uL — ABNORMAL HIGH (ref 0.1–1.0)
Monocytes Relative: 14 % — ABNORMAL HIGH (ref 3–12)
Neutro Abs: 4.6 10*3/uL (ref 1.7–7.7)
Neutrophils Relative %: 56 % (ref 43–77)

## 2010-07-19 LAB — POCT I-STAT, CHEM 8
BUN: 24 mg/dL — ABNORMAL HIGH (ref 6–23)
Calcium, Ion: 1.12 mmol/L (ref 1.12–1.32)
Chloride: 109 mEq/L (ref 96–112)
Creatinine, Ser: 1.4 mg/dL (ref 0.4–1.5)
Glucose, Bld: 121 mg/dL — ABNORMAL HIGH (ref 70–99)
HCT: 46 % (ref 39.0–52.0)
Hemoglobin: 15.6 g/dL (ref 13.0–17.0)
Potassium: 5.5 mEq/L — ABNORMAL HIGH (ref 3.5–5.1)
Sodium: 141 mEq/L (ref 135–145)
TCO2: 23 mmol/L (ref 0–100)

## 2010-07-19 LAB — CBC
HCT: 44.2 % (ref 39.0–52.0)
Hemoglobin: 15 g/dL (ref 13.0–17.0)
MCHC: 34 g/dL (ref 30.0–36.0)
MCV: 90.2 fL (ref 78.0–100.0)
Platelets: 153 10*3/uL (ref 150–400)
RBC: 4.9 MIL/uL (ref 4.22–5.81)
RDW: 15.2 % (ref 11.5–15.5)
WBC: 8.1 10*3/uL (ref 4.0–10.5)

## 2010-07-19 LAB — POCT CARDIAC MARKERS
CKMB, poc: <1 ng/mL — ABNORMAL LOW (ref 1.0–8.0)
Myoglobin, poc: 74.7 ng/mL (ref 12–200)
Troponin i, poc: <0.05 ng/mL (ref 0.00–0.09)

## 2010-07-21 ENCOUNTER — Telehealth: Payer: Self-pay | Admitting: Cardiology

## 2010-07-21 ENCOUNTER — Ambulatory Visit (INDEPENDENT_AMBULATORY_CARE_PROVIDER_SITE_OTHER): Payer: Medicare Other | Admitting: Cardiology

## 2010-07-21 ENCOUNTER — Encounter: Payer: Self-pay | Admitting: Cardiology

## 2010-07-21 DIAGNOSIS — I714 Abdominal aortic aneurysm, without rupture: Secondary | ICD-10-CM

## 2010-07-21 DIAGNOSIS — I2510 Atherosclerotic heart disease of native coronary artery without angina pectoris: Secondary | ICD-10-CM

## 2010-07-21 DIAGNOSIS — I739 Peripheral vascular disease, unspecified: Secondary | ICD-10-CM

## 2010-07-21 DIAGNOSIS — I359 Nonrheumatic aortic valve disorder, unspecified: Secondary | ICD-10-CM

## 2010-07-21 DIAGNOSIS — E7800 Pure hypercholesterolemia, unspecified: Secondary | ICD-10-CM

## 2010-07-21 DIAGNOSIS — I701 Atherosclerosis of renal artery: Secondary | ICD-10-CM

## 2010-07-21 DIAGNOSIS — I10 Essential (primary) hypertension: Secondary | ICD-10-CM

## 2010-07-21 MED ORDER — OMEPRAZOLE 20 MG PO CPDR: 20.0000 mg | DELAYED_RELEASE_CAPSULE | Freq: Every day | ORAL | Status: DC

## 2010-07-21 MED ORDER — CLOPIDOGREL BISULFATE 75 MG PO TABS: 75.0000 mg | ORAL_TABLET | Freq: Every day | ORAL | Status: DC

## 2010-07-21 MED ORDER — EZETIMIBE 10 MG PO TABS: 10.0000 mg | ORAL_TABLET | Freq: Every day | ORAL | Status: DC

## 2010-07-21 MED ORDER — ROSUVASTATIN CALCIUM 40 MG PO TABS: 40.0000 mg | ORAL_TABLET | Freq: Every day | ORAL | Status: DC

## 2010-07-21 MED ORDER — RAMIPRIL 10 MG PO CAPS: 10.0000 mg | ORAL_CAPSULE | Freq: Every day | ORAL | Status: DC

## 2010-07-21 NOTE — Progress Notes (Signed)
HPI: Kristopher [name] is a pleasant  gentleman who has a history of coronary artery disease, status post PCI of his LAD performed in February 1998. A previous echocardiogram revealed normal LV function. There was mild aortic stenosis by report with a mean gradient of 7 mm of mercury. 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 Followup was recommended in one year. Cardiac catheterization in February 2011 and showed an ejection fraction of 60% and no renal artery stenosis. He had a 90% right coronary artery but no obstructive disease in the left system. He had PCI of his right coronary artery with a drug-eluting stent at that time. I last saw him in March of 2011. Since then the the patient has dyspnea with more extreme activities but not with routine activities. It is relieved with rest. It is not associated with chest pain. There is no orthopnea, PND or pedal edema. There is no syncope or palpitations. There is no exertional chest pain.   Current Outpatient Prescriptions  Medication Sig Dispense Refill  . Ascorbic Acid (VITAMIN C) 500 MG tablet Take 500 mg by mouth daily.        Marland Kitchen aspirin 325 MG EC tablet Take 325 mg by mouth daily.        . Cholecalciferol (VITAMIN D3) 1000 UNITS tablet Take 1,000 Units by mouth as directed.        . clopidogrel (PLAVIX) 75 MG tablet Take 75 mg by mouth daily.        Marland Kitchen co-enzyme Q-10 30 MG capsule Take 30 mg by mouth daily.        Marland Kitchen ezetimibe (ZETIA) 10 MG tablet Take 10 mg by mouth daily.        . fish oil-omega-3 fatty acids 1000 MG capsule Take 2 g by mouth daily.        . Flaxseed, Linseed, (FLAX SEED OIL PO) 1 tab po qd       . Ginkgo Biloba (GINKOBA) 40 MG TABS Take by mouth daily.        . Multiple Vitamin (MULTIVITAMIN) capsule Take 1 capsule by mouth daily.        Marland Kitchen omeprazole (PRILOSEC) 20 MG capsule Take 20 mg by mouth daily.        . ramipril (ALTACE)  10 MG capsule Take 10 mg by mouth daily.        . rosuvastatin (CRESTOR) 40 MG tablet Take 40 mg by mouth daily.        . TESTOSTERONE BU As directed       . vitamin B-12 (CYANOCOBALAMIN) 1000 MCG tablet Take 1,000 mcg by mouth daily.        . vitamin E 400 UNIT capsule Take 400 Units by mouth daily.           Past Medical History  Diagnosis Date  . Acute myocardial infarction, unspecified site, episode of care unspecified   . Unspecified essential hypertension   . Coronary atherosclerosis of unspecified type of vessel, native or graft   . Aortic stenosis   . Other and unspecified hyperlipidemia   . Peripheral vascular disease, unspecified   . Atherosclerosis of renal artery   . Pure hypercholesterolemia   . Esophageal reflux     Past Surgical History  Procedure Date  . Pci of lad 05/1996    History   Social History  . Marital Status: Married  Spouse Name: N/A    Number of Children: N/A  . Years of Education: N/A   Occupational History  . Not on file.   Social History Main Topics  . Smoking status: Never Smoker   . Smokeless tobacco: Not on file  . Alcohol Use: No  . Drug Use: No  . Sexually Active:    Other Topics Concern  . Not on file   Social History Narrative  . No narrative on file    ROS: Hip arthralgias but no fevers or chills, productive cough, hemoptysis, dysphasia, odynophagia, melena, hematochezia, dysuria, hematuria, rash, seizure activity, orthopnea, PND, pedal edema, claudication. Remaining systems are negative.  Physical Exam: Well-developed well-nourished in no acute distress.  Skin is warm and dry.  HEENT is normal.  Neck is supple. No thyromegaly.  Chest is clear to auscultation with normal expansion.  Cardiovascular exam is regular rate and rhythm.  Abdominal exam nontender or distended. No masses palpated. Extremities show no edema. neuro grossly intact  ECG Sinus rhythm, left axis deviation, no ST changes.

## 2010-07-21 NOTE — Telephone Encounter (Signed)
Pt has question re where debra sent refills

## 2010-07-21 NOTE — Assessment & Plan Note (Signed)
Repeat echocardiogram. 

## 2010-07-21 NOTE — Patient Instructions (Signed)
Your physician has requested that you have an echocardiogram. Echocardiography is a painless test that uses sound waves to create images of your heart. It provides your doctor with information about the size and shape of your heart and how well your heart's chambers and valves are working. This procedure takes approximately one hour. There are no restrictions for this procedure. Your physician wants you to follow-up in: 1 year. You will receive a reminder letter in the mail two months in advance. If you don't receive a letter, please call our office to schedule the follow-up appointment. Your physician recommends that you return for a FASTING lipid profile: lipid, liver and bmet with echo

## 2010-07-21 NOTE — Telephone Encounter (Signed)
Spoke with pt, he gave [place] the wrong pharm. [facility][phone]) Deliah Goody

## 2010-07-21 NOTE — Assessment & Plan Note (Signed)
Continue aspirin and statin. 

## 2010-07-21 NOTE — Assessment & Plan Note (Signed)
Blood pressure controlled. Continue present medications. Check potassium and renal function. 

## 2010-07-21 NOTE — Assessment & Plan Note (Signed)
Continue aspirin, Plavix and statin. 

## 2010-07-21 NOTE — Assessment & Plan Note (Signed)
Continue present medications. Check lipids and liver. 

## 2010-07-21 NOTE — Assessment & Plan Note (Signed)
Followup abdominal ultrasound March 2013.

## 2010-07-28 ENCOUNTER — Ambulatory Visit (HOSPITAL_COMMUNITY): Payer: Medicare Other | Attending: Cardiology | Admitting: Radiology

## 2010-07-28 ENCOUNTER — Encounter: Payer: Self-pay | Admitting: *Deleted

## 2010-07-28 ENCOUNTER — Other Ambulatory Visit (INDEPENDENT_AMBULATORY_CARE_PROVIDER_SITE_OTHER): Payer: Medicare Other | Admitting: *Deleted

## 2010-07-28 DIAGNOSIS — E7800 Pure hypercholesterolemia, unspecified: Secondary | ICD-10-CM | POA: Insufficient documentation

## 2010-07-28 DIAGNOSIS — I359 Nonrheumatic aortic valve disorder, unspecified: Secondary | ICD-10-CM

## 2010-07-28 DIAGNOSIS — I079 Rheumatic tricuspid valve disease, unspecified: Secondary | ICD-10-CM | POA: Insufficient documentation

## 2010-07-28 DIAGNOSIS — I10 Essential (primary) hypertension: Secondary | ICD-10-CM | POA: Insufficient documentation

## 2010-07-28 DIAGNOSIS — R0989 Other specified symptoms and signs involving the circulatory and respiratory systems: Secondary | ICD-10-CM | POA: Insufficient documentation

## 2010-07-28 DIAGNOSIS — I2510 Atherosclerotic heart disease of native coronary artery without angina pectoris: Secondary | ICD-10-CM

## 2010-07-28 DIAGNOSIS — R0609 Other forms of dyspnea: Secondary | ICD-10-CM | POA: Insufficient documentation

## 2010-07-28 LAB — LIPID PANEL
Cholesterol: 124 mg/dL (ref 0–200)
HDL: 45.9 mg/dL (ref >39.00)
LDL Cholesterol: 65 mg/dL (ref 0–99)
Total CHOL/HDL Ratio: 3
Triglycerides: 64 mg/dL (ref 0.0–149.0)
VLDL: 12.8 mg/dL (ref 0.0–40.0)

## 2010-07-28 LAB — HEPATIC FUNCTION PANEL
ALT: 19 U/L (ref 0–53)
AST: 25 U/L (ref 0–37)
Albumin: 3.9 g/dL (ref 3.5–5.2)
Alkaline Phosphatase: 56 U/L (ref 39–117)
Bilirubin, Direct: 0.1 mg/dL (ref 0.0–0.3)
Total Bilirubin: 0.6 mg/dL (ref 0.3–1.2)
Total Protein: 6.5 g/dL (ref 6.0–8.3)

## 2010-07-28 LAB — BASIC METABOLIC PANEL
BUN: 25 mg/dL — ABNORMAL HIGH (ref 6–23)
CO2: 27 mEq/L (ref 19–32)
Calcium: 9.5 mg/dL (ref 8.4–10.5)
Chloride: 107 mEq/L (ref 96–112)
Creatinine, Ser: 1.1 mg/dL (ref 0.4–1.5)
GFR: 67.42 mL/min (ref >60.00)
Glucose, Bld: 85 mg/dL (ref 70–99)
Potassium: 4.4 mEq/L (ref 3.5–5.1)
Sodium: 142 mEq/L (ref 135–145)

## 2010-08-23 NOTE — Assessment & Plan Note (Signed)
[facility]                            CARDIOLOGY OFFICE NOTE   JOSEDANIEL, HAYE                       MRN:          [id number]  DATE:04/22/2007                            DOB:          1930/07/08    Kristopher [name] is a very pleasant 77 year old gentleman who has a history of  coronary disease, status post PCI of his LAD.  He had this performed in  February 1998.  He also has a history of renal artery stenosis, status  post intervention.  His most recent nuclear study was performed on  April 18, 2005.  At that time, his ejection fraction was 55%.  There  was a prior inferior infarct, but no ischemia.  His most recent  echocardiogram on May 02, 2006 showed normal LV function.  There was  a very mild aortic stenosis, with a mean gradient of 5 mmHg, per Dr.  Eden Emms.  Since I last saw him, he is doing well.  There is some dyspnea  on exertion which has been a chronic issue, but there is no orthopnea,  PND, pedal edema, palpitations, presyncope, or exertional chest pain.  He has had a pain in the left chest area, but it increases with lying on  it, and it has been continuous for the past one week.  He thinks he may  have pulled a muscle.   MEDICATIONS:  1. Multivitamin daily.  2. Vitamin C.  3. Fish oil.  4. Coenzyme Q.  5. Omeprazole 20 mg daily.  6. Aspirin 325 mg p.o. daily.  7. Altace 10 mg p.o. daily.  8. Imdur 30 mg p.o. daily.  9. Crestor 40 mg p.o. daily.  10.Zetia 10 mg p.o. daily.  11.Flomax.  12.Vitamin E.  13.Vitamin D3.   PHYSICAL EXAMINATION:  VITAL SIGNS:  Blood pressure 127/81, pulse of 65.  He weighs 169 pounds.  HEENT:  Normal.  NECK:  Supple, with no bruits.  CHEST:  Clear.  CARDIOVASCULAR:  Regular rhythm.  I could appreciate a murmur today.  ABDOMEN:  Shows no tenderness.  EXTREMITIES:  Show no edema.   Electrocardiogram shows a sinus rhythm at a rate of 66.  There are no ST  changes noted.   DIAGNOSES:  1.  Coronary disease - Mr. Cumbie has had no chest pain.  We will      continue with medical therapy, including his ACE inhibitor,      aspirin, and statin.  2. History of renal artery stenosis - We will continue with medical      therapy, and we will also plan to repeat his renal Dopplers.  I      will also check a BMET and follow  his potassium and renal      function.  3. History of peripheral vascular disease.  4. History of hyperlipidemia - We will continue his statin.  I will      check lipids and liver and adjust as indicated.  5. History of mild aortic stenosis - We will plan to repeat his      echocardiogram in one year.  I will also most likely repeat his      Myoview at that point.   He will continue with risk factor modification, including diet and  exercise.  He does not smoke.  He will see [place] back in one year.     Madolyn Frieze Jens Som, MD, [facility]  Electronically Signed    BSC/MedQ  DD: 04/22/2007  DT: 04/23/2007  Job #: [id number]   cc:   Reuben Likes, M.D.

## 2010-08-23 NOTE — Assessment & Plan Note (Signed)
[facility]                            CARDIOLOGY OFFICE NOTE   ARYON, NHAM                       MRN:          [id number]  DATE:05/25/2008                            DOB:          1931/02/22    Mr. Gobert is a pleasant 77 year old gentleman who has a history of  coronary artery disease, status post PCI of his LAD performed in  February 1998.  I last saw him on April 22, 2007.  Since that time, he  does note dyspnea on exertion, which is worse compared to previous.  However, it is not associated with chest pain.  It is relieved promptly  with rest.  There is no orthopnea, PND, or pedal edema.  There is no  associated cough.  He has not had presyncope or syncope.   MEDICATIONS:  1. Multivitamin daily.  2. Vitamin C.  3. Fish oil.  4. Coenzyme Q.  5. Omeprazole 20 mg p.o. daily.  6. Aspirin 325 mg p.o. daily.  7. Altace 10 mg p.o. daily.  8. Imdur 30 mg p.o. daily.  9. Crestor 40 mg daily p.o. daily.  10.Zetia 10 mg p.o. daily.  11.Vitamin E.  12.Vitamin D3.  13.Ginkgo.   PHYSICAL EXAMINATION:  VITAL SIGNS:  Blood pressure 153/77.  His pulse  is 56.  He weighs 169 pounds.  HEENT:  Normal.  NECK:  Supple.  No bruits.  CHEST:  Clear.  CARDIOVASCULAR:  Regular rate and rhythm.  ABDOMEN:  No tenderness.  EXTREMITIES:  No edema.   His electrocardiogram shows a sinus rhythm at a rate of 55.  There are  no ST changes noted.   DIAGNOSES:  1. Coronary artery disease - Mr. Ruppe has had no chest pain, but he      does note increased dyspnea on exertion.  We will plan to risk      stratify with an adenosine Myoview.  If it shows no ischemia, then      we will continue medical therapy including his aspirin, angiotensin-      converting enzyme inhibitor, and statin.  2. Dyspnea - there may be a component of chronic obstructive pulmonary      disease contributing to his dyspnea.  I do not think his aortic      stenosis has progressed on  examination, but we will proceed with      repeat echocardiogram to reassess this.  We are also planning to      Myoview.  3. History of renal artery stenosis - he had followup renal Dopplers      today and we will await those records.  We will also check a BMET      to follow his potassium and renal function.  4. History of peripheral vascular disease.  5. Hyperlipidemia - he will continue on his statin and Zetia.  We will      check lipids and liver to adjust as indicated.  6. History of mild aortic stenosis - as per above.  7. Hypertension - his blood pressure is mildly elevated today, but he  states his systolic blood pressure typically runs in the 120s.  We      will track this and adjust his regimen in the future as indicated.   We will see him back in 12 months.     Madolyn Frieze Jens Som, MD, [facility]  Electronically Signed    BSC/MedQ  DD: 05/25/2008  DT: 05/25/2008  Job #: [id number]   cc:   Reuben Likes, M.D.

## 2010-08-26 NOTE — Assessment & Plan Note (Signed)
[facility]                            CARDIOLOGY OFFICE NOTE   Kristopher [name], Kristopher [name]                       MRN:          [id number]  DATE:04/13/2006                            DOB:          December 31, 1930    Kristopher [name] returns for followup today. Please refer to my previous notes  for details. Since I last saw him, he has mild dyspnea on exertion which  is unchanged. There is no orthopnea, PND, pedal edema, presyncope,  syncope or exertional chest pain. There is no claudication.   MEDICATIONS:  1. Multivitamin.  2. Fish oil 100 mg p.o. b.i.d.  3. Vitamin C.  4. Coenzyme Q.  5. Omeprazole.  6. Aspirin 325 mg p.o. daily.  7. Altace 10 mg p.o. daily.  8. Isosorbide 30 mg p.o. daily.  9. Crestor 40 mg p.o. daily.  10.Zetia 10 mg p.o. daily.   PHYSICAL EXAMINATION:  VITAL SIGNS:  Blood pressure 126/82 and his pulse  is 63.  NECK:  Supple with no bruits.  CHEST:  Clear.  CARDIOVASCULAR:  Reveals a regular rate and rhythm.  EXTREMITIES:  Show no edema.   His electrocardiogram  shows a sinus rhythm at a rate of 63. There are  no ST changes noted.   DIAGNOSES:  1. Coronary artery disease.  2. History of renal artery stenosis.  3. History of peripheral vascular disease.  4. History of hyperlipidemia.  5. History of mild aortic stenosis.   PLAN:  Kristopher [name] is doing well from a symptomatic standpoint. His most  recent nuclear study approximately 1 year ago showed a question of an  inferior infarct but there was no ischemia. His LV function was  preserved. We will plan to repeat his echocardiogram to reassess his  aortic valve. Will also replete his renal Dopplers. We will check a CMET  and lipids today and adjust his  medications as indicated. He will continue with risk factor modification  including diet and exercise. Note he does not smoke. He will return to  see [place] in one year.     Kristopher Frieze Jens Som, MD, [facility]  Electronically Signed    BSC/MedQ  DD: 04/13/2006  DT: 04/13/2006  Job #: [id number]   cc:   Kristopher [name], M.D.

## 2011-05-24 ENCOUNTER — Encounter (HOSPITAL_BASED_OUTPATIENT_CLINIC_OR_DEPARTMENT_OTHER): Payer: Self-pay | Admitting: Emergency Medicine

## 2011-05-24 ENCOUNTER — Other Ambulatory Visit: Payer: Self-pay

## 2011-05-24 ENCOUNTER — Inpatient Hospital Stay (HOSPITAL_BASED_OUTPATIENT_CLINIC_OR_DEPARTMENT_OTHER)
Admission: EM | Admit: 2011-05-24 | Discharge: 2011-05-26 | DRG: 689 | Disposition: A | Discharge status: Home / Self Care | Payer: Medicare Other | Attending: Internal Medicine | Admitting: Internal Medicine

## 2011-05-24 ENCOUNTER — Emergency Department (INDEPENDENT_AMBULATORY_CARE_PROVIDER_SITE_OTHER): Payer: Medicare Other

## 2011-05-24 DIAGNOSIS — K219 Gastro-esophageal reflux disease without esophagitis: Secondary | ICD-10-CM | POA: Diagnosis present

## 2011-05-24 DIAGNOSIS — Z79899 Other long term (current) drug therapy: Secondary | ICD-10-CM

## 2011-05-24 DIAGNOSIS — I739 Peripheral vascular disease, unspecified: Secondary | ICD-10-CM | POA: Diagnosis present

## 2011-05-24 DIAGNOSIS — Z882 Allergy status to sulfonamides status: Secondary | ICD-10-CM | POA: Diagnosis not present

## 2011-05-24 DIAGNOSIS — Z7902 Long term (current) use of antithrombotics/antiplatelets: Secondary | ICD-10-CM

## 2011-05-24 DIAGNOSIS — E785 Hyperlipidemia, unspecified: Secondary | ICD-10-CM | POA: Diagnosis present

## 2011-05-24 DIAGNOSIS — N390 Urinary tract infection, site not specified: Secondary | ICD-10-CM | POA: Diagnosis present

## 2011-05-24 DIAGNOSIS — I252 Old myocardial infarction: Secondary | ICD-10-CM

## 2011-05-24 DIAGNOSIS — N400 Enlarged prostate without lower urinary tract symptoms: Secondary | ICD-10-CM | POA: Diagnosis present

## 2011-05-24 DIAGNOSIS — I10 Essential (primary) hypertension: Secondary | ICD-10-CM | POA: Insufficient documentation

## 2011-05-24 DIAGNOSIS — I70229 Atherosclerosis of native arteries of extremities with rest pain, unspecified extremity: Secondary | ICD-10-CM | POA: Diagnosis not present

## 2011-05-24 DIAGNOSIS — Z7982 Long term (current) use of aspirin: Secondary | ICD-10-CM

## 2011-05-24 DIAGNOSIS — R109 Unspecified abdominal pain: Secondary | ICD-10-CM | POA: Diagnosis present

## 2011-05-24 DIAGNOSIS — I2510 Atherosclerotic heart disease of native coronary artery without angina pectoris: Secondary | ICD-10-CM | POA: Diagnosis not present

## 2011-05-24 DIAGNOSIS — R112 Nausea with vomiting, unspecified: Secondary | ICD-10-CM | POA: Diagnosis not present

## 2011-05-24 DIAGNOSIS — Z9861 Coronary angioplasty status: Secondary | ICD-10-CM

## 2011-05-24 DIAGNOSIS — K449 Diaphragmatic hernia without obstruction or gangrene: Secondary | ICD-10-CM

## 2011-05-24 DIAGNOSIS — E782 Mixed hyperlipidemia: Secondary | ICD-10-CM | POA: Diagnosis not present

## 2011-05-24 DIAGNOSIS — K859 Acute pancreatitis without necrosis or infection, unspecified: Secondary | ICD-10-CM | POA: Diagnosis not present

## 2011-05-24 DIAGNOSIS — I359 Nonrheumatic aortic valve disorder, unspecified: Secondary | ICD-10-CM | POA: Diagnosis present

## 2011-05-24 DIAGNOSIS — I701 Atherosclerosis of renal artery: Secondary | ICD-10-CM | POA: Diagnosis present

## 2011-05-24 LAB — CARDIAC PANEL(CRET KIN+CKTOT+MB+TROPI)
CK, MB: 2.9 ng/mL (ref 0.3–4.0)
Relative Index: 2.8 — ABNORMAL HIGH (ref 0.0–2.5)
Total CK: 104 U/L (ref 7–232)
Troponin I: <0.3 ng/mL (ref <0.30)

## 2011-05-24 LAB — LIPASE, BLOOD: Lipase: 80 U/L — ABNORMAL HIGH (ref 11–59)

## 2011-05-24 LAB — CBC
HCT: 44.1 % (ref 39.0–52.0)
Hemoglobin: 15.2 g/dL (ref 13.0–17.0)
MCH: 30.4 pg (ref 26.0–34.0)
MCHC: 34.5 g/dL (ref 30.0–36.0)
MCV: 88.2 fL (ref 78.0–100.0)
Platelets: 238 10*3/uL (ref 150–400)
RBC: 5 MIL/uL (ref 4.22–5.81)
RDW: 14.8 % (ref 11.5–15.5)
WBC: 14.9 10*3/uL — ABNORMAL HIGH (ref 4.0–10.5)

## 2011-05-24 LAB — COMPREHENSIVE METABOLIC PANEL
ALT: 28 U/L (ref 0–53)
AST: 40 U/L — ABNORMAL HIGH (ref 0–37)
Albumin: 4.6 g/dL (ref 3.5–5.2)
Alkaline Phosphatase: 72 U/L (ref 39–117)
BUN: 32 mg/dL — ABNORMAL HIGH (ref 6–23)
CO2: 24 mEq/L (ref 19–32)
Calcium: 10 mg/dL (ref 8.4–10.5)
Chloride: 104 mEq/L (ref 96–112)
Creatinine, Ser: 1.2 mg/dL (ref 0.50–1.35)
GFR calc Af Amer: 65 mL/min — ABNORMAL LOW (ref >90)
GFR calc non Af Amer: 56 mL/min — ABNORMAL LOW (ref >90)
Glucose, Bld: 161 mg/dL — ABNORMAL HIGH (ref 70–99)
Potassium: 4.7 mEq/L (ref 3.5–5.1)
Sodium: 141 mEq/L (ref 135–145)
Total Bilirubin: 0.6 mg/dL (ref 0.3–1.2)
Total Protein: 7.8 g/dL (ref 6.0–8.3)

## 2011-05-24 LAB — OCCULT BLOOD X 1 CARD TO LAB, STOOL: Fecal Occult Bld: NEGATIVE

## 2011-05-24 LAB — URINALYSIS, ROUTINE W REFLEX MICROSCOPIC
Bilirubin Urine: NEGATIVE
Glucose, UA: NEGATIVE mg/dL
Hgb urine dipstick: NEGATIVE
Ketones, ur: 15 mg/dL — AB
Leukocytes, UA: NEGATIVE
Nitrite: NEGATIVE
Protein, ur: 30 mg/dL — AB
Specific Gravity, Urine: 1.028 (ref 1.005–1.030)
Urobilinogen, UA: 1 mg/dL (ref 0.0–1.0)
pH: 5 (ref 5.0–8.0)

## 2011-05-24 LAB — DIFFERENTIAL
Basophils Absolute: 0 10*3/uL (ref 0.0–0.1)
Basophils Relative: 0 % (ref 0–1)
Eosinophils Absolute: 0.1 10*3/uL (ref 0.0–0.7)
Eosinophils Relative: 0 % (ref 0–5)
Lymphocytes Relative: 10 % — ABNORMAL LOW (ref 12–46)
Lymphs Abs: 1.5 10*3/uL (ref 0.7–4.0)
Monocytes Absolute: 0.7 10*3/uL (ref 0.1–1.0)
Monocytes Relative: 5 % (ref 3–12)
Neutro Abs: 12.2 10*3/uL — ABNORMAL HIGH (ref 1.7–7.7)
Neutrophils Relative %: 84 % — ABNORMAL HIGH (ref 43–77)

## 2011-05-24 LAB — URINE MICROSCOPIC-ADD ON: WBC, UA: NONE SEEN WBC/hpf (ref <3)

## 2011-05-24 MED ORDER — ONDANSETRON HCL 4 MG/2ML IJ SOLN
4.0000 mg | Freq: Once | INTRAMUSCULAR | Status: AC
  Administered 2011-05-24: 4 mg via INTRAVENOUS
  Filled 2011-05-24: qty 2

## 2011-05-24 MED ORDER — SODIUM CHLORIDE 0.9 % IV BOLUS (SEPSIS)
1000.0000 mL | Freq: Once | INTRAVENOUS | Status: AC
  Administered 2011-05-24: 1000 mL via INTRAVENOUS

## 2011-05-24 MED ORDER — IOHEXOL 300 MG/ML  SOLN
20.0000 mL | Freq: Once | INTRAMUSCULAR | Status: AC | PRN
  Administered 2011-05-24: 20 mL via ORAL

## 2011-05-24 MED ORDER — IOHEXOL 300 MG/ML  SOLN
100.0000 mL | Freq: Once | INTRAMUSCULAR | Status: AC | PRN
  Administered 2011-05-24: 100 mL via INTRAVENOUS

## 2011-05-24 MED ORDER — HYDROMORPHONE HCL PF 1 MG/ML IJ SOLN
1.0000 mg | Freq: Once | INTRAMUSCULAR | Status: AC
  Administered 2011-05-24: 1 mg via INTRAVENOUS
  Filled 2011-05-24: qty 1

## 2011-05-24 NOTE — ED Notes (Signed)
Attempt to call report 2nd time  to donna rn On [address], nurse still unavailable

## 2011-05-24 NOTE — ED Notes (Signed)
Attempt to call report to [phone], nurse not available

## 2011-05-24 NOTE — ED Notes (Signed)
Pt c/o NV with intermittent lower middle abd pain since this afternoon

## 2011-05-24 NOTE — ED Provider Notes (Signed)
History     CSN: [id number]  Arrival date & time 05/24/11  1825   First MD Initiated Contact with Patient 05/24/11 1933      Chief Complaint  Patient presents with  . Abdominal Pain  . Emesis    (Consider location/radiation/quality/duration/timing/severity/associated sxs/prior treatment) HPI Pain began at 0800 with suprapubic pressure comes and goes.  Patient gets increased pressure, vomits then pain resolves.  Pain returns after about an hours and lasts about an hour until vomits again.  No diarrhea, last bm today.  No similar symptoms in past.  No fever, cough, chest pain, increased frequency, or pain with urination.  PMD -none, cardiologist- Crenshaw.  Past Medical History  Diagnosis Date  . Acute myocardial infarction, unspecified site, episode of care unspecified   . Unspecified essential hypertension   . Coronary atherosclerosis of unspecified type of vessel, native or graft   . Aortic stenosis   . Other and unspecified hyperlipidemia   . Peripheral vascular disease, unspecified   . Atherosclerosis of renal artery   . Pure hypercholesterolemia   . Esophageal reflux     Past Surgical History  Procedure Date  . Pci of lad 05/1996    No family history on file.  History  Substance Use Topics  . Smoking status: Former Games developer  . Smokeless tobacco: Not on file  . Alcohol Use: No      Review of Systems  All other systems reviewed and are negative.    Allergies  Sulfa antibiotics  Home Medications   Current Outpatient Rx  Name Route Sig Dispense Refill  . VITAMIN C 500 MG PO TABS Oral Take 500 mg by mouth 2 (two) times daily.     . ASPIRIN 325 MG PO TBEC Oral Take 325 mg by mouth daily.      Marland Kitchen VITAMIN D3 1000 UNITS PO TABS Oral Take 1,000 Units by mouth daily.     Marland Kitchen CLOPIDOGREL BISULFATE 75 MG PO TABS Oral Take 1 tablet (75 mg total) by mouth daily. 30 tablet 1  . COENZYME Q10 30 MG PO CAPS Oral Take 30 mg by mouth daily.      Marland Kitchen EZETIMIBE 10 MG PO TABS Oral  Take 1 tablet (10 mg total) by mouth daily. 90 tablet 3  . OMEGA-3 FATTY ACIDS 1000 MG PO CAPS Oral Take 1 g by mouth 2 (two) times daily.     Marland Kitchen FLAX SEED OIL PO Oral Take 1 tablet by mouth daily.     Marland Kitchen GINKGO BILOBA 40 MG PO TABS Oral Take 1 tablet by mouth daily.     . MULTIVITAMINS PO CAPS Oral Take 1 capsule by mouth daily.      Marland Kitchen OMEPRAZOLE 20 MG PO CPDR Oral Take 1 capsule (20 mg total) by mouth daily. 90 capsule 3  . RAMIPRIL 10 MG PO CAPS Oral Take 1 capsule (10 mg total) by mouth daily. 90 capsule 3  . ROSUVASTATIN CALCIUM 40 MG PO TABS Oral Take 1 tablet (40 mg total) by mouth daily. 90 tablet 3  . VITAMIN E 400 UNITS PO CAPS Oral Take 400 Units by mouth daily.        BP 118/73  Pulse 101  Resp 16  SpO2 93%  Physical Exam  Nursing note and vitals reviewed. Constitutional: He is oriented to person, place, and time. He appears well-developed and well-nourished.  HENT:  Head: Normocephalic and atraumatic.  Eyes: Conjunctivae and EOM are normal. Pupils are equal, round, and reactive to  light.  Neck: Normal range of motion. Neck supple.  Cardiovascular: Normal rate and regular rhythm.   Murmur heard. Pulmonary/Chest: Effort normal and breath sounds normal.  Abdominal: Soft. There is tenderness. There is no rebound.       Tender epigastrium, ruq, and suprapubic  Genitourinary: Rectum normal, prostate normal and penis normal.  Musculoskeletal: Normal range of motion.  Neurological: He is alert and oriented to person, place, and time. He has normal reflexes.  Skin: Skin is warm and dry.  Psychiatric: He has a normal mood and affect.    ED Course  Procedures (including critical care time)  Labs Reviewed  CBC - Abnormal; Notable for the following:    WBC 14.9 (*)    All other components within normal limits  URINALYSIS, ROUTINE W REFLEX MICROSCOPIC  COMPREHENSIVE METABOLIC PANEL   No results found.   No diagnosis found.   Results for orders placed during the  hospital encounter of 05/24/11  URINALYSIS, ROUTINE W REFLEX MICROSCOPIC      Component Value Range   Color, Urine AMBER (*) YELLOW    APPearance CLEAR  CLEAR    Specific Gravity, Urine 1.028  1.005 - 1.030    pH 5.0  5.0 - 8.0    Glucose, UA NEGATIVE  NEGATIVE (mg/dL)   Hgb urine dipstick NEGATIVE  NEGATIVE    Bilirubin Urine NEGATIVE  NEGATIVE    Ketones, ur 15 (*) NEGATIVE (mg/dL)   Protein, ur 30 (*) NEGATIVE (mg/dL)   Urobilinogen, UA 1.0  0.0 - 1.0 (mg/dL)   Nitrite NEGATIVE  NEGATIVE    Leukocytes, UA NEGATIVE  NEGATIVE   CBC      Component Value Range   WBC 14.9 (*) 4.0 - 10.5 (K/uL)   RBC 5.00  4.22 - 5.81 (MIL/uL)   Hemoglobin 15.2  13.0 - 17.0 (g/dL)   HCT 04.5  40.9 - 81.1 (%)   MCV 88.2  78.0 - 100.0 (fL)   MCH 30.4  26.0 - 34.0 (pg)   MCHC 34.5  30.0 - 36.0 (g/dL)   RDW 91.4  78.2 - 95.6 (%)   Platelets 238  150 - 400 (K/uL)  COMPREHENSIVE METABOLIC PANEL      Component Value Range   Sodium 141  135 - 145 (mEq/L)   Potassium 4.7  3.5 - 5.1 (mEq/L)   Chloride 104  96 - 112 (mEq/L)   CO2 24  19 - 32 (mEq/L)   Glucose, Bld 161 (*) 70 - 99 (mg/dL)   BUN 32 (*) 6 - 23 (mg/dL)   Creatinine, Ser 2.13  0.50 - 1.35 (mg/dL)   Calcium 08.6  8.4 - 10.5 (mg/dL)   Total Protein 7.8  6.0 - 8.3 (g/dL)   Albumin 4.6  3.5 - 5.2 (g/dL)   AST 40 (*) 0 - 37 (U/L)   ALT 28  0 - 53 (U/L)   Alkaline Phosphatase 72  39 - 117 (U/L)   Total Bilirubin 0.6  0.3 - 1.2 (mg/dL)   GFR calc non Af Amer 56 (*) >90 (mL/min)   GFR calc Af Amer 65 (*) >90 (mL/min)  URINE MICROSCOPIC-ADD ON      Component Value Range   Squamous Epithelial / LPF RARE  RARE    WBC, UA    <3 (WBC/hpf)   Value: NO FORMED ELEMENTS SEEN ON URINE MICROSCOPIC EXAMINATION   RBC / HPF 0-2  <3 (RBC/hpf)   Bacteria, UA RARE  RARE   LIPASE, BLOOD  Component Value Range   Lipase 80 (*) 11 - 59 (U/L)  DIFFERENTIAL      Component Value Range   Neutrophils Relative 84 (*) 43 - 77 (%)   Neutro Abs 12.2 (*) 1.7 -  7.7 (K/uL)   Lymphocytes Relative 10 (*) 12 - 46 (%)   Lymphs Abs 1.5  0.7 - 4.0 (K/uL)   Monocytes Relative 5  3 - 12 (%)   Monocytes Absolute 0.7  0.1 - 1.0 (K/uL)   Eosinophils Relative 0  0 - 5 (%)   Eosinophils Absolute 0.1  0.0 - 0.7 (K/uL)   Basophils Relative 0  0 - 1 (%)   Basophils Absolute 0.0  0.0 - 0.1 (K/uL)  OCCULT BLOOD X 1 CARD TO LAB, STOOL      Component Value Range   Fecal Occult Bld NEGATIVE     Ct Abdomen Pelvis W Contrast  05/24/2011  *RADIOLOGY REPORT*  Clinical Data: Abdominal pain with nausea and vomiting  CT ABDOMEN AND PELVIS WITH CONTRAST  Technique:  Multidetector CT imaging of the abdomen and pelvis was performed following the standard protocol during bolus administration of intravenous contrast.  Contrast: OMNIPAQUE IOHEXOL 300 MG/ML IV SOLN, 20mL OMNIPAQUE IOHEXOL 300 MG/ML IV SOLN  Comparison: None.  Findings: Large hiatal hernia.  Lung bases are clear.  Calcified granulomata in the liver.  No focal liver mass. Gallbladder and bile ducts are normal.  Pancreatic duct is slightly prominent but probably within normal limits.  No pancreatic mass or edema is present.  Calcified granuloma in the spleen.  4 cm left renal cyst.  No hydronephrosis or solid renal mass.  Atherosclerotic aorta without aneurysm.  No mass or adenopathy.  Negative for bowel obstruction.  Appendix is normal.  Prostate enlargement.  Bladder wall thickening.  No acute bony abnormality.  IMPRESSION: Large hiatal hernia.  No acute abnormality in the abdomen.  Prostate enlargement.  Original Report Authenticated By: Camelia Phenes, M.D.   MDM  Patient with good pain relief with dilaudid 1 mg.  Results reviewed and elevated lipase,ast,  and wbc noted.  Patient will require ultrasound in a.m. To better assess gallbladder, continued iv fluids and pain medication.   Discussed with Dr. Adela Glimpse and patient to be transferred to tele bed.        Hilario Quarry, MD 05/24/11 2150

## 2011-05-25 ENCOUNTER — Inpatient Hospital Stay (HOSPITAL_COMMUNITY): Payer: Medicare Other

## 2011-05-25 ENCOUNTER — Encounter (HOSPITAL_COMMUNITY): Payer: Self-pay | Admitting: Internal Medicine

## 2011-05-25 DIAGNOSIS — R112 Nausea with vomiting, unspecified: Secondary | ICD-10-CM | POA: Diagnosis not present

## 2011-05-25 DIAGNOSIS — R109 Unspecified abdominal pain: Secondary | ICD-10-CM | POA: Diagnosis present

## 2011-05-25 DIAGNOSIS — I70229 Atherosclerosis of native arteries of extremities with rest pain, unspecified extremity: Secondary | ICD-10-CM | POA: Diagnosis not present

## 2011-05-25 DIAGNOSIS — K859 Acute pancreatitis without necrosis or infection, unspecified: Secondary | ICD-10-CM | POA: Diagnosis not present

## 2011-05-25 DIAGNOSIS — I2510 Atherosclerotic heart disease of native coronary artery without angina pectoris: Secondary | ICD-10-CM | POA: Diagnosis not present

## 2011-05-25 DIAGNOSIS — E782 Mixed hyperlipidemia: Secondary | ICD-10-CM | POA: Diagnosis not present

## 2011-05-25 LAB — GLUCOSE, CAPILLARY
Glucose-Capillary: 91 mg/dL (ref 70–99)
Glucose-Capillary: 97 mg/dL (ref 70–99)
Glucose-Capillary: 100 mg/dL — ABNORMAL HIGH (ref 70–99)

## 2011-05-25 LAB — LIPASE, BLOOD: Lipase: 31 U/L (ref 11–59)

## 2011-05-25 LAB — CARDIAC PANEL(CRET KIN+CKTOT+MB+TROPI)
CK, MB: 2.8 ng/mL (ref 0.3–4.0)
CK, MB: 2.9 ng/mL (ref 0.3–4.0)
CK, MB: 3.1 ng/mL (ref 0.3–4.0)
Relative Index: 2.7 — ABNORMAL HIGH (ref 0.0–2.5)
Relative Index: 2.4 (ref 0.0–2.5)
Relative Index: 1.7 (ref 0.0–2.5)
Total CK: 102 U/L (ref 7–232)
Total CK: 119 U/L (ref 7–232)
Total CK: 185 U/L (ref 7–232)
Troponin I: <0.3 ng/mL (ref <0.30)
Troponin I: <0.3 ng/mL (ref <0.30)
Troponin I: <0.3 ng/mL (ref <0.30)

## 2011-05-25 LAB — LIPID PANEL
Cholesterol: 115 mg/dL (ref 0–200)
HDL: 45 mg/dL (ref >39)
LDL Cholesterol: 53 mg/dL (ref 0–99)
Total CHOL/HDL Ratio: 2.6 RATIO
Triglycerides: 83 mg/dL (ref <150)
VLDL: 17 mg/dL (ref 0–40)

## 2011-05-25 LAB — COMPREHENSIVE METABOLIC PANEL
ALT: 47 U/L (ref 0–53)
AST: 80 U/L — ABNORMAL HIGH (ref 0–37)
Albumin: 3.5 g/dL (ref 3.5–5.2)
Alkaline Phosphatase: 57 U/L (ref 39–117)
BUN: 26 mg/dL — ABNORMAL HIGH (ref 6–23)
CO2: 26 mEq/L (ref 19–32)
Calcium: 9 mg/dL (ref 8.4–10.5)
Chloride: 110 mEq/L (ref 96–112)
Creatinine, Ser: 1.07 mg/dL (ref 0.50–1.35)
GFR calc Af Amer: 75 mL/min — ABNORMAL LOW (ref >90)
GFR calc non Af Amer: 64 mL/min — ABNORMAL LOW (ref >90)
Glucose, Bld: 103 mg/dL — ABNORMAL HIGH (ref 70–99)
Potassium: 4.7 mEq/L (ref 3.5–5.1)
Sodium: 142 mEq/L (ref 135–145)
Total Bilirubin: 0.6 mg/dL (ref 0.3–1.2)
Total Protein: 6.3 g/dL (ref 6.0–8.3)

## 2011-05-25 LAB — CBC
HCT: 38.9 % — ABNORMAL LOW (ref 39.0–52.0)
Hemoglobin: 13 g/dL (ref 13.0–17.0)
MCH: 30 pg (ref 26.0–34.0)
MCHC: 33.4 g/dL (ref 30.0–36.0)
MCV: 89.6 fL (ref 78.0–100.0)
Platelets: 203 10*3/uL (ref 150–400)
RBC: 4.34 MIL/uL (ref 4.22–5.81)
RDW: 14.8 % (ref 11.5–15.5)
WBC: 7.6 10*3/uL (ref 4.0–10.5)

## 2011-05-25 MED ORDER — EZETIMIBE 10 MG PO TABS
10.0000 mg | ORAL_TABLET | Freq: Every day | ORAL | Status: DC
  Administered 2011-05-25 – 2011-05-26 (×2): 10 mg via ORAL
  Filled 2011-05-25 (×2): qty 1

## 2011-05-25 MED ORDER — SODIUM CHLORIDE 0.9 % IJ SOLN
3.0000 mL | Freq: Two times a day (BID) | INTRAMUSCULAR | Status: DC
  Administered 2011-05-25 – 2011-05-26 (×3): 3 mL via INTRAVENOUS

## 2011-05-25 MED ORDER — ACETAMINOPHEN 325 MG PO TABS: 650.0000 mg | ORAL_TABLET | Freq: Four times a day (QID) | ORAL | Status: DC | PRN

## 2011-05-25 MED ORDER — CLOPIDOGREL BISULFATE 75 MG PO TABS
75.0000 mg | ORAL_TABLET | Freq: Every day | ORAL | Status: DC
  Administered 2011-05-25 – 2011-05-26 (×2): 75 mg via ORAL
  Filled 2011-05-25 (×3): qty 1

## 2011-05-25 MED ORDER — ONDANSETRON HCL 4 MG PO TABS: 4.0000 mg | ORAL_TABLET | Freq: Four times a day (QID) | ORAL | Status: DC | PRN

## 2011-05-25 MED ORDER — ROSUVASTATIN CALCIUM 40 MG PO TABS
40.0000 mg | ORAL_TABLET | Freq: Every day | ORAL | Status: DC
  Administered 2011-05-25 – 2011-05-26 (×2): 40 mg via ORAL
  Filled 2011-05-25 (×2): qty 1

## 2011-05-25 MED ORDER — HYDROCODONE-ACETAMINOPHEN 5-325 MG PO TABS: 1.0000 | ORAL_TABLET | Freq: Four times a day (QID) | ORAL | Status: DC | PRN

## 2011-05-25 MED ORDER — PANTOPRAZOLE SODIUM 40 MG PO TBEC
40.0000 mg | DELAYED_RELEASE_TABLET | Freq: Every day | ORAL | Status: DC
  Administered 2011-05-25 – 2011-05-26 (×2): 40 mg via ORAL
  Filled 2011-05-25 (×2): qty 1

## 2011-05-25 MED ORDER — OMEGA-3 FATTY ACIDS 1000 MG PO CAPS: 1.0000 g | ORAL_CAPSULE | Freq: Two times a day (BID) | ORAL | Status: DC

## 2011-05-25 MED ORDER — CIPROFLOXACIN HCL 500 MG PO TABS
500.0000 mg | ORAL_TABLET | Freq: Two times a day (BID) | ORAL | Status: DC
  Administered 2011-05-25 – 2011-05-26 (×3): 500 mg via ORAL
  Filled 2011-05-25 (×5): qty 1

## 2011-05-25 MED ORDER — ASPIRIN EC 325 MG PO TBEC
325.0000 mg | DELAYED_RELEASE_TABLET | Freq: Every day | ORAL | Status: DC
  Administered 2011-05-25 – 2011-05-26 (×2): 325 mg via ORAL
  Filled 2011-05-25 (×2): qty 1

## 2011-05-25 MED ORDER — ACETAMINOPHEN 650 MG RE SUPP: 650.0000 mg | Freq: Four times a day (QID) | RECTAL | Status: DC | PRN

## 2011-05-25 MED ORDER — TAMSULOSIN HCL 0.4 MG PO CAPS
0.4000 mg | ORAL_CAPSULE | Freq: Every day | ORAL | Status: DC
  Filled 2011-05-25 (×2): qty 1

## 2011-05-25 MED ORDER — SODIUM CHLORIDE 0.9 % IV SOLN
INTRAVENOUS | Status: DC
  Administered 2011-05-25: 03:00:00 via INTRAVENOUS

## 2011-05-25 MED ORDER — RAMIPRIL 10 MG PO CAPS
10.0000 mg | ORAL_CAPSULE | Freq: Every day | ORAL | Status: DC
  Administered 2011-05-25 – 2011-05-26 (×2): 10 mg via ORAL
  Filled 2011-05-25 (×2): qty 1

## 2011-05-25 MED ORDER — OMEGA-3-ACID ETHYL ESTERS 1 G PO CAPS
1.0000 g | ORAL_CAPSULE | Freq: Two times a day (BID) | ORAL | Status: DC
  Administered 2011-05-25 – 2011-05-26 (×3): 1 g via ORAL
  Filled 2011-05-25 (×5): qty 1

## 2011-05-25 MED ORDER — HYDROMORPHONE HCL PF 1 MG/ML IJ SOLN
0.5000 mg | INTRAMUSCULAR | Status: DC | PRN
  Administered 2011-05-25: 0.5 mg via INTRAVENOUS
  Filled 2011-05-25 (×2): qty 1

## 2011-05-25 MED ORDER — ONDANSETRON HCL 4 MG/2ML IJ SOLN: 4.0000 mg | Freq: Four times a day (QID) | INTRAMUSCULAR | Status: DC | PRN

## 2011-05-25 NOTE — Clinical Documentation Improvement (Signed)
GENERIC DOCUMENTATION CLARIFICATION QUERY  THIS DOCUMENT IS NOT A PERMANENT PART OF THE MEDICAL RECORD  TO RESPOND TO THE THIS QUERY, FOLLOW THE INSTRUCTIONS BELOW:  1. If needed, update documentation for the patient's encounter via the notes activity.  2. Access this query again and click edit on the In [employer].  3. After updating, or not, click F2 to complete all highlighted (required) fields concerning your review. Select "additional documentation in the medical record" OR "no additional documentation provided".  4. Click Sign note button.  5. The deficiency will fall out of your In Basket *Please let us know if you are not able to complete this workflow by phone or e-mail (listed below).  Please update your documentation within the medical record to reflect your response to this query.                                                                                         05/25/11   Dear Dr. [facility],  In a better effort to capture your patient's severity of illness, reflect appropriate length of stay and utilization of resources, a review of the patient medical record has revealed the following indicators.    Based on your clinical judgment, please clarify and document in a progress note and/or discharge summary the clinical condition associated with the following supporting information:  In responding to this query please exercise your independent judgment.  The fact that a query is asked, does not imply that any particular answer is desired or expected.  In a better effort to capture your patient's severity of illness,  please clarify and document the possible, probable or suspected conditon that is being treated with Cipro 500mg  po twice a day & Flomax po daily in the Progress Notes and Discharge Summary.    .   Possible Clinical Conditions   _______Other Condition  _______Cannot Clinically Determine    Supporting Information: Bladder on ct  with increase thickening per notes Prostate gland enlarge per notes  Signs & Symptoms: Suprapubic pain some urinary frequency   You may use possible, probable, or suspect with inpatient documentation. possible, probable, suspected diagnoses MUST be documented at the time of discharge   Reviewed: additional documentation in the medical record   Thank You,    Rossie Muskrat RN, BSN Clinical Documentation Specialist Pager:  [phone]  [email or web address]  Health Information Management [place]  I updated my note. Treating for presume UTI, BPH.

## 2011-05-25 NOTE — Progress Notes (Addendum)
Subjective: Feeling well. Relates suprapubic pain. On and off. No more nausea. Feels he can eat.   Objective: Filed Vitals:   05/24/11 1832 05/24/11 2224 05/24/11 2357 05/25/11 0450  BP: 118/73 132/88 113/73 122/69  Pulse: 101 89 74 72  Temp:  98.4 F (36.9 C) 97.6 F (36.4 C) 97.6 F (36.4 C)  TempSrc:   Oral Oral  Resp: 16  22 18   Weight:   72.4 kg (159 lb 9.8 oz) 72.3 kg (159 lb 6.3 oz)  SpO2: 93% 98% 93% 93%   Weight change:   Intake/Output Summary (Last 24 hours) at 05/25/11 1136 Last data filed at 05/25/11 0420  Gross per 24 hour  Intake      0 ml  Output    100 ml  Net   -100 ml    General: Alert, awake, oriented x3, in no acute distress.  HEENT: No bruits, no goiter.  Heart: Regular rate and rhythm, without murmurs, rubs, gallops.  Lungs: CTA, bilateral air movement.  Abdomen: Soft, nontender, nondistended, positive bowel sounds.  Neuro: Grossly intact, nonfocal. Extremities; no edema.   Lab Results:  [facility] 05/25/11 0557 05/24/11 1925  NA 142 141  K 4.7 4.7  CL 110 104  CO2 26 24  GLUCOSE 103* 161*  BUN 26* 32*  CREATININE 1.07 1.20  CALCIUM 9.0 10.0  MG -- --  PHOS -- --    Basename 05/25/11 0557 05/24/11 1925  AST 80* 40*  ALT 47 28  ALKPHOS 57 72  BILITOT 0.6 0.6  PROT 6.3 7.8  ALBUMIN 3.5 4.6    Basename 05/25/11 0557 05/24/11 1925  LIPASE 31 80*  AMYLASE -- --    Basename 05/25/11 0557 05/24/11 1925  WBC 7.6 14.9*  NEUTROABS -- 12.2*  HGB 13.0 15.2  HCT 38.9* 44.1  MCV 89.6 88.2  PLT 203 238    Basename 05/25/11 0912 05/25/11 0107 05/24/11 2159  CKTOTAL 119 102 104  CKMB 2.9 2.8 2.9  CKMBINDEX -- -- --  TROPONINI <0.30 <0.30 <0.30   Basename 05/25/11 0557  CHOL 115  HDL 45  LDLCALC 53  TRIG 83  CHOLHDL 2.6  LDLDIRECT --    Studies/Results: US Abdomen Complete  05/25/2011  *RADIOLOGY REPORT*  Clinical Data:  78 year old male with abdominal pain nausea and vomiting.  COMPLETE ABDOMINAL ULTRASOUND  Comparison:  CT  abdomen pelvis 05/24/2011.  Findings:  Gallbladder:  No gallstones, gallbladder wall thickening, or pericholecystic fluid. No sonographic Murphy's sign elicited.  Common bile duct:  Normal measuring 5 mm in diameter.  Liver:  Coarse calcification in the left lobe re-identified. Echotexture elsewhere within normal limits.  IVC:  Appears normal.  Pancreas:  No focal abnormality seen.  Spleen:  5 mm calcified granuloma re-identified, otherwise normal measuring 6.4 cm in length.  Right Kidney:  Normal measuring 10.9 cm in length.  Tiny lower pole cyst.  Left Kidney:  4-5 cm mid pole partially exophytic cyst re- identified and appears simple by ultrasound.  Otherwise normal measuring 11.8 cm in length.  Abdominal aorta:  No aneurysm identified.  IMPRESSION: 1.  Normal gallbladder. 2.  No acute findings in the abdomen.  Original Report Authenticated By: Harley Hallmark, M.D.   Ct Abdomen Pelvis W Contrast  05/24/2011  *RADIOLOGY REPORT*  Clinical Data: Abdominal pain with nausea and vomiting  CT ABDOMEN AND PELVIS WITH CONTRAST  Technique:  Multidetector CT imaging of the abdomen and pelvis was performed following the standard protocol during bolus administration of intravenous  contrast.  Contrast: OMNIPAQUE IOHEXOL 300 MG/ML IV SOLN, 20mL OMNIPAQUE IOHEXOL 300 MG/ML IV SOLN  Comparison: None.  Findings: Large hiatal hernia.  Lung bases are clear.  Calcified granulomata in the liver.  No focal liver mass. Gallbladder and bile ducts are normal.  Pancreatic duct is slightly prominent but probably within normal limits.  No pancreatic mass or edema is present.  Calcified granuloma in the spleen.  4 cm left renal cyst.  No hydronephrosis or solid renal mass.  Atherosclerotic aorta without aneurysm.  No mass or adenopathy.  Negative for bowel obstruction.  Appendix is normal.  Prostate enlargement.  Bladder wall thickening.  No acute bony abnormality.  IMPRESSION: Large hiatal hernia.  No acute abnormality in the  abdomen.  Prostate enlargement.  Original Report Authenticated By: Camelia Phenes, M.D.    Medications: I have reviewed the patient's current medications.   Patient Active Hospital Problem List:  Abdominal pain (05/25/2011) Lower quadrant, suprapubic. Bladder on ct with increase thickening. Prostate gland enlarge. He is able to urinate. I will start ciprofloxacin and Tamsulosin. Less likely pancreatitis. I will start clears diet and advance as tolerated. I called [facility] they will call him to arrange outpatient follow up. [place] negative.   HYPERLIPIDEMIA (05/18/2009) Continue with Zetia.   HYPERTENSION (05/18/2009)  Continue with home medications.  CAD (05/18/2009)  Pancreatitis (05/24/2011) Start clears diet , advance as tolerated.         LOS: 1 day   Django Nguyen M.D.  [facility] 05/25/2011, 11:36 AM  Presume UTI: Ciprofloxacin BID.  Presume BPH: I will start Tamsulosin.

## 2011-05-25 NOTE — H&P (Signed)
Kristopher [name] is an 78 y.o. male.   PCP - Usually follows cardiologist at [facility]. Chief Complaint: Abdominal pain. HPI: 78 year-old male with history of CAD status post stenting, mild aortic stenosis, peripheral vascular disease and hyperlipidemia has been experiencing suprapubic pain since yesterday morning. The pain slowly builds up and gets relieved only after he throws up. As it is been persistent so he came to the ER and blood work shows mildly elevated lipase but CAT scan does not show anything acute. Patient's pain is relieved by pain relief medications. Patient also and he has been having some urinary frequency but has been ongoing for last 5-6 years. Denies any dysphagia fever chills denies any chest pain shortness of breath cough or phlegm denies any focal deficits.  Past Medical History  Diagnosis Date  . Acute myocardial infarction, unspecified site, episode of care unspecified   . Unspecified essential hypertension   . Coronary atherosclerosis of unspecified type of vessel, native or graft   . Aortic stenosis   . Other and unspecified hyperlipidemia   . Peripheral vascular disease, unspecified   . Atherosclerosis of renal artery   . Pure hypercholesterolemia   . Esophageal reflux     Past Surgical History  Procedure Date  . Pci of lad 05/1996    History reviewed. No pertinent family history. Social History:  reports that he quit smoking about 20 years ago. He does not have any smokeless tobacco history on file. He reports that he does not drink alcohol or use illicit drugs.  Allergies:  Allergies  Allergen Reactions  . Sulfa Antibiotics Rash    Medications Prior to Admission  Medication Dose Route Frequency Provider Last Rate Last Dose  . 0.9 %  sodium chloride infusion   Intravenous Continuous Eduard Clos, MD      . acetaminophen (TYLENOL) tablet 650 mg  650 mg Oral Q6H PRN Eduard Clos, MD       Or  . acetaminophen (TYLENOL) suppository 650 mg   650 mg Rectal Q6H PRN Eduard Clos, MD      . aspirin EC tablet 325 mg  325 mg Oral Daily Eduard Clos, MD      . clopidogrel (PLAVIX) tablet 75 mg  75 mg Oral Q breakfast Eduard Clos, MD      . ezetimibe (ZETIA) tablet 10 mg  10 mg Oral Daily Eduard Clos, MD      . HYDROmorphone (DILAUDID) injection 0.5 mg  0.5 mg Intravenous Q4H PRN Eduard Clos, MD      . HYDROmorphone (DILAUDID) injection 1 mg  1 mg Intravenous Once Hilario Quarry, MD   1 mg at 05/24/11 2002  . iohexol (OMNIPAQUE) 300 MG/ML solution 100 mL  100 mL Intravenous Once PRN Medication Radiologist, MD   100 mL at 05/24/11 2056  . iohexol (OMNIPAQUE) 300 MG/ML solution 20 mL  20 mL Oral Once PRN Medication Radiologist, MD   20 mL at 05/24/11 2056  . omega-3 acid ethyl esters (LOVAZA) capsule 1 g  1 g Oral BID Belkys Regalado, MD      . ondansetron (ZOFRAN) injection 4 mg  4 mg Intravenous Once Hilario Quarry, MD   4 mg at 05/24/11 2002  . ondansetron (ZOFRAN) tablet 4 mg  4 mg Oral Q6H PRN Eduard Clos, MD       Or  . ondansetron [facility]) injection 4 mg  4 mg Intravenous Q6H PRN Eduard Clos, MD      .  pantoprazole (PROTONIX) EC tablet 40 mg  40 mg Oral Q1200 Eduard Clos, MD      . ramipril (ALTACE) capsule 10 mg  10 mg Oral Daily Eduard Clos, MD      . rosuvastatin (CRESTOR) tablet 40 mg  40 mg Oral Daily Eduard Clos, MD      . sodium chloride 0.9 % bolus 1,000 mL  1,000 mL Intravenous Once Hilario Quarry, MD   1,000 mL at 05/24/11 2003  . sodium chloride 0.9 % injection 3 mL  3 mL Intravenous Q12H Eduard Clos, MD      . DISCONTD: fish oil-omega-3 fatty acids capsule 1 g  1 g Oral BID Eduard Clos, MD       Medications Prior to Admission  Medication Sig Dispense Refill  . Ascorbic Acid (VITAMIN C) 500 MG tablet Take 500 mg by mouth 2 (two) times daily.       Marland Kitchen aspirin 325 MG EC tablet Take 325 mg by mouth daily.        .  Cholecalciferol (VITAMIN D3) 1000 UNITS tablet Take 1,000 Units by mouth daily.       . clopidogrel (PLAVIX) 75 MG tablet Take 1 tablet (75 mg total) by mouth daily.  30 tablet  1  . co-enzyme Q-10 30 MG capsule Take 30 mg by mouth daily.        Marland Kitchen ezetimibe (ZETIA) 10 MG tablet Take 1 tablet (10 mg total) by mouth daily.  90 tablet  3  . fish oil-omega-3 fatty acids 1000 MG capsule Take 1 g by mouth 2 (two) times daily.       . Flaxseed, Linseed, (FLAX SEED OIL PO) Take 1 tablet by mouth daily.       . Ginkgo Biloba (GINKOBA) 40 MG TABS Take 1 tablet by mouth daily.       . Multiple Vitamin (MULTIVITAMIN) capsule Take 1 capsule by mouth daily.        Marland Kitchen omeprazole (PRILOSEC) 20 MG capsule Take 1 capsule (20 mg total) by mouth daily.  90 capsule  3  . ramipril (ALTACE) 10 MG capsule Take 1 capsule (10 mg total) by mouth daily.  90 capsule  3  . rosuvastatin (CRESTOR) 40 MG tablet Take 1 tablet (40 mg total) by mouth daily.  90 tablet  3  . vitamin E 400 UNIT capsule Take 400 Units by mouth daily.          Results for orders placed during the hospital encounter of 05/24/11 (from the past 48 hour(s))  URINALYSIS, ROUTINE W REFLEX MICROSCOPIC     Status: Abnormal   Collection Time   05/24/11  7:23 PM      Component Value Range Comment   Color, Urine AMBER (*) YELLOW  BIOCHEMICALS MAY BE AFFECTED BY COLOR   APPearance CLEAR  CLEAR     Specific Gravity, Urine 1.028  1.005 - 1.030     pH 5.0  5.0 - 8.0     Glucose, UA NEGATIVE  NEGATIVE (mg/dL)    Hgb urine dipstick NEGATIVE  NEGATIVE     Bilirubin Urine NEGATIVE  NEGATIVE     Ketones, ur 15 (*) NEGATIVE (mg/dL)    Protein, ur 30 (*) NEGATIVE (mg/dL)    Urobilinogen, UA 1.0  0.0 - 1.0 (mg/dL)    Nitrite NEGATIVE  NEGATIVE     Leukocytes, UA NEGATIVE  NEGATIVE    URINE MICROSCOPIC-ADD ON     Status:  Normal   Collection Time   05/24/11  7:23 PM      Component Value Range Comment   Squamous Epithelial / LPF RARE  RARE     WBC, UA    <3  (WBC/hpf)    Value: NO FORMED ELEMENTS SEEN ON URINE MICROSCOPIC EXAMINATION   RBC / HPF 0-2  <3 (RBC/hpf)    Bacteria, UA RARE  RARE    CBC     Status: Abnormal   Collection Time   05/24/11  7:25 PM      Component Value Range Comment   WBC 14.9 (*) 4.0 - 10.5 (K/uL)    RBC 5.00  4.22 - 5.81 (MIL/uL)    Hemoglobin 15.2  13.0 - 17.0 (g/dL)    HCT 29.5  28.4 - 13.2 (%)    MCV 88.2  78.0 - 100.0 (fL)    MCH 30.4  26.0 - 34.0 (pg)    MCHC 34.5  30.0 - 36.0 (g/dL)    RDW 44.0  10.2 - 72.5 (%)    Platelets 238  150 - 400 (K/uL)   COMPREHENSIVE METABOLIC PANEL     Status: Abnormal   Collection Time   05/24/11  7:25 PM      Component Value Range Comment   Sodium 141  135 - 145 (mEq/L)    Potassium 4.7  3.5 - 5.1 (mEq/L)    Chloride 104  96 - 112 (mEq/L)    CO2 24  19 - 32 (mEq/L)    Glucose, Bld 161 (*) 70 - 99 (mg/dL)    BUN 32 (*) 6 - 23 (mg/dL)    Creatinine, Ser 3.66  0.50 - 1.35 (mg/dL)    Calcium 44.0  8.4 - 10.5 (mg/dL)    Total Protein 7.8  6.0 - 8.3 (g/dL)    Albumin 4.6  3.5 - 5.2 (g/dL)    AST 40 (*) 0 - 37 (U/L)    ALT 28  0 - 53 (U/L)    Alkaline Phosphatase 72  39 - 117 (U/L)    Total Bilirubin 0.6  0.3 - 1.2 (mg/dL)    GFR calc non Af Amer 56 (*) >90 (mL/min)    GFR calc Af Amer 65 (*) >90 (mL/min)   LIPASE, BLOOD     Status: Abnormal   Collection Time   05/24/11  7:25 PM      Component Value Range Comment   Lipase 80 (*) 11 - 59 (U/L)   DIFFERENTIAL     Status: Abnormal   Collection Time   05/24/11  7:25 PM      Component Value Range Comment   Neutrophils Relative 84 (*) 43 - 77 (%)    Neutro Abs 12.2 (*) 1.7 - 7.7 (K/uL)    Lymphocytes Relative 10 (*) 12 - 46 (%)    Lymphs Abs 1.5  0.7 - 4.0 (K/uL)    Monocytes Relative 5  3 - 12 (%)    Monocytes Absolute 0.7  0.1 - 1.0 (K/uL)    Eosinophils Relative 0  0 - 5 (%)    Eosinophils Absolute 0.1  0.0 - 0.7 (K/uL)    Basophils Relative 0  0 - 1 (%)    Basophils Absolute 0.0  0.0 - 0.1 (K/uL)   OCCULT BLOOD X 1  CARD TO LAB, STOOL     Status: Normal   Collection Time   05/24/11  8:02 PM      Component Value Range Comment   Fecal Occult  Bld NEGATIVE     CARDIAC PANEL(CRET KIN+CKTOT+MB+TROPI)     Status: Abnormal   Collection Time   05/24/11  9:59 PM      Component Value Range Comment   Total CK 104  7 - 232 (U/L)    CK, MB 2.9  0.3 - 4.0 (ng/mL)    Troponin I <0.30  <0.30 (ng/mL)    Relative Index 2.8 (*) 0.0 - 2.5     Ct Abdomen Pelvis W Contrast  05/24/2011  *RADIOLOGY REPORT*  Clinical Data: Abdominal pain with nausea and vomiting  CT ABDOMEN AND PELVIS WITH CONTRAST  Technique:  Multidetector CT imaging of the abdomen and pelvis was performed following the standard protocol during bolus administration of intravenous contrast.  Contrast: OMNIPAQUE IOHEXOL 300 MG/ML IV SOLN, 20mL OMNIPAQUE IOHEXOL 300 MG/ML IV SOLN  Comparison: None.  Findings: Large hiatal hernia.  Lung bases are clear.  Calcified granulomata in the liver.  No focal liver mass. Gallbladder and bile ducts are normal.  Pancreatic duct is slightly prominent but probably within normal limits.  No pancreatic mass or edema is present.  Calcified granuloma in the spleen.  4 cm left renal cyst.  No hydronephrosis or solid renal mass.  Atherosclerotic aorta without aneurysm.  No mass or adenopathy.  Negative for bowel obstruction.  Appendix is normal.  Prostate enlargement.  Bladder wall thickening.  No acute bony abnormality.  IMPRESSION: Large hiatal hernia.  No acute abnormality in the abdomen.  Prostate enlargement.  Original Report Authenticated By: Camelia Phenes, M.D.    Review of Systems  Constitutional: Negative.   HENT: Negative.   Eyes: Negative.   Respiratory: Negative.   Cardiovascular: Negative.   Gastrointestinal: Positive for nausea, vomiting and abdominal pain.  Genitourinary: Negative.   Musculoskeletal: Negative.   Skin: Negative.   Neurological: Negative.   Endo/Heme/Allergies: Negative.     Psychiatric/Behavioral: Negative.     Blood pressure 113/73, pulse 74, temperature 97.6 F (36.4 C), temperature source Oral, resp. rate 22, weight 72.4 kg (159 lb 9.8 oz), SpO2 93.00%. Physical Exam  Constitutional: He is oriented to person, place, and time. He appears well-developed and well-nourished. No distress.  HENT:  Head: Normocephalic and atraumatic.  Right Ear: External ear normal.  Left Ear: External ear normal.  Nose: Nose normal.  Mouth/Throat: Oropharynx is clear and moist. No oropharyngeal exudate.  Eyes: Conjunctivae and EOM are normal. Pupils are equal, round, and reactive to light. Right eye exhibits no discharge. Left eye exhibits no discharge. No scleral icterus.  Neck: Normal range of motion. Neck supple.  Cardiovascular: Normal rate and regular rhythm.   Respiratory: Effort normal and breath sounds normal. No respiratory distress. He has no wheezes. He has no rales.  GI: Soft. Bowel sounds are normal. He exhibits no distension. There is no tenderness. There is no rebound and no guarding.  Musculoskeletal: Normal range of motion. He exhibits no edema and no tenderness.  Neurological: He is alert and oriented to person, place, and time.       Moves upper and lower extremities.  Skin: Skin is warm and dry. No rash noted. He is not diaphoretic. No erythema.  Psychiatric: His behavior is normal.     Assessment/Plan #1. Abdominal pain with mildly elevated lipase most likely could be mild pancreatitis - location of patient's pain is not very characteristic for pancreatitis. At this time we will keep him n.p.o. as patient still had some nausea and feels like vomiting. We'll recheck his  lipase again in a.m. along with LFTs. He does not drink alcohol. We'll get a sonogram of the abdomen to check for gallbladder and CBD and also check fasting lipid panel. At this time patient will be placed on pain relief medications and also gentle hydration. #2. History of CAD status post  stenting - patient denies any chest pain, his EKG does not show any acute. We will cycle cardiac markers. #3. History of hyperlipidemia and peripheral vascular disease and mild aortic stenosis - continue present medications. #4. There was mention of abdominal aortic aneurysm and previous cardiac followup notes. Tonight's CAT scan does not show anything acute in the aorta.  CODE STATUS - full code.   Vir Whetstine N. 05/25/2011, 1:00 AM

## 2011-05-26 DIAGNOSIS — K859 Acute pancreatitis without necrosis or infection, unspecified: Secondary | ICD-10-CM | POA: Diagnosis not present

## 2011-05-26 DIAGNOSIS — I2510 Atherosclerotic heart disease of native coronary artery without angina pectoris: Secondary | ICD-10-CM | POA: Diagnosis not present

## 2011-05-26 DIAGNOSIS — E782 Mixed hyperlipidemia: Secondary | ICD-10-CM | POA: Diagnosis not present

## 2011-05-26 DIAGNOSIS — I70229 Atherosclerosis of native arteries of extremities with rest pain, unspecified extremity: Secondary | ICD-10-CM | POA: Diagnosis not present

## 2011-05-26 LAB — GLUCOSE, CAPILLARY
Glucose-Capillary: 100 mg/dL — ABNORMAL HIGH (ref 70–99)
Glucose-Capillary: 90 mg/dL (ref 70–99)
Glucose-Capillary: 110 mg/dL — ABNORMAL HIGH (ref 70–99)
Glucose-Capillary: 100 mg/dL — ABNORMAL HIGH (ref 70–99)
Glucose-Capillary: 99 mg/dL (ref 70–99)

## 2011-05-26 MED ORDER — HYDROCODONE-ACETAMINOPHEN 5-325 MG PO TABS: 1.0000 | ORAL_TABLET | Freq: Three times a day (TID) | ORAL | Status: AC | PRN

## 2011-05-26 MED ORDER — CIPROFLOXACIN HCL 500 MG PO TABS: 500.0000 mg | ORAL_TABLET | Freq: Two times a day (BID) | ORAL | Status: AC

## 2011-05-26 MED ORDER — TAMSULOSIN HCL 0.4 MG PO CAPS: 0.4000 mg | ORAL_CAPSULE | Freq: Every day | ORAL | Status: DC

## 2011-05-26 NOTE — Discharge Summary (Signed)
Admit date: 05/24/2011 Discharge date: 05/26/2011  Primary Care Physician:  No primary provider on file.   Discharge Diagnoses:   No resolved problems to display.  Active Hospital Problems  Diagnoses Date Noted   . Abdominal pain, presume UTI.  05/25/2011    Prostate gland Enlargement 05/24/2011   . HYPERLIPIDEMIA 05/18/2009   . HYPERTENSION 05/18/2009   . CAD 05/18/2009              DISCHARGE MEDICATION: Medication List  As of 05/26/2011  9:29 AM   STOP taking these medications         GINKOBA 40 MG Tabs         TAKE these medications         aspirin 325 MG EC tablet   Take 325 mg by mouth daily.      cholecalciferol 1000 UNITS tablet   Commonly known as: VITAMIN D   Take 1,000 Units by mouth daily.      ciprofloxacin 500 MG tablet   Commonly known as: CIPRO   Take 1 tablet (500 mg total) by mouth 2 (two) times daily.      clopidogrel 75 MG tablet   Commonly known as: PLAVIX   Take 1 tablet (75 mg total) by mouth daily.      co-enzyme Q-10 30 MG capsule   Take 30 mg by mouth daily.      ezetimibe 10 MG tablet   Commonly known as: ZETIA   Take 1 tablet (10 mg total) by mouth daily.      fish oil-omega-3 fatty acids 1000 MG capsule   Take 1 g by mouth 2 (two) times daily.      FLAX SEED OIL PO   Take 1 tablet by mouth daily.      HYDROcodone-acetaminophen 5-325 MG per tablet   Commonly known as: NORCO   Take 1 tablet by mouth every 8 (eight) hours as needed.      multivitamin capsule   Take 1 capsule by mouth daily.      omeprazole 20 MG capsule   Commonly known as: PRILOSEC   Take 1 capsule (20 mg total) by mouth daily.      ramipril 10 MG capsule   Commonly known as: ALTACE   Take 1 capsule (10 mg total) by mouth daily.      rosuvastatin 40 MG tablet   Commonly known as: CRESTOR   Take 1 tablet (40 mg total) by mouth daily.      Tamsulosin HCl 0.4 MG Caps   Commonly known as: FLOMAX   Take 1 capsule (0.4 mg total) by mouth daily after  supper.      vitamin C 500 MG tablet   Commonly known as: ASCORBIC ACID   Take 500 mg by mouth 2 (two) times daily.      vitamin E 400 UNIT capsule   Take 400 Units by mouth daily.              Consults:     SIGNIFICANT DIAGNOSTIC STUDIES:  US Abdomen Complete  05/25/2011  *RADIOLOGY REPORT*  Clinical Data:  78 year old male with abdominal pain nausea and vomiting.  COMPLETE ABDOMINAL ULTRASOUND  Comparison:  CT abdomen pelvis 05/24/2011.  Findings:  Gallbladder:  No gallstones, gallbladder wall thickening, or pericholecystic fluid. No sonographic Murphy's sign elicited.  Common bile duct:  Normal measuring 5 mm in diameter.  Liver:  Coarse calcification in the left lobe re-identified. Echotexture elsewhere within normal limits.  IVC:  Appears normal.  Pancreas:  No focal abnormality seen.  Spleen:  5 mm calcified granuloma re-identified, otherwise normal measuring 6.4 cm in length.  Right Kidney:  Normal measuring 10.9 cm in length.  Tiny lower pole cyst.  Left Kidney:  4-5 cm mid pole partially exophytic cyst re- identified and appears simple by ultrasound.  Otherwise normal measuring 11.8 cm in length.  Abdominal aorta:  No aneurysm identified.  IMPRESSION: 1.  Normal gallbladder. 2.  No acute findings in the abdomen.  Original Report Authenticated By: Harley Hallmark, M.D.   Ct Abdomen Pelvis W Contrast  05/24/2011  *RADIOLOGY REPORT*  Clinical Data: Abdominal pain with nausea and vomiting  CT ABDOMEN AND PELVIS WITH CONTRAST  Technique:  Multidetector CT imaging of the abdomen and pelvis was performed following the standard protocol during bolus administration of intravenous contrast.  Contrast: OMNIPAQUE IOHEXOL 300 MG/ML IV SOLN, 20mL OMNIPAQUE IOHEXOL 300 MG/ML IV SOLN  Comparison: None.  Findings: Large hiatal hernia.  Lung bases are clear.  Calcified granulomata in the liver.  No focal liver mass. Gallbladder and bile ducts are normal.  Pancreatic duct is slightly prominent but  probably within normal limits.  No pancreatic mass or edema is present.  Calcified granuloma in the spleen.  4 cm left renal cyst.  No hydronephrosis or solid renal mass.  Atherosclerotic aorta without aneurysm.  No mass or adenopathy.  Negative for bowel obstruction.  Appendix is normal.  Prostate enlargement.  Bladder wall thickening.  No acute bony abnormality.  IMPRESSION: Large hiatal hernia.  No acute abnormality in the abdomen.  Prostate enlargement.  Original Report Authenticated By: Camelia Phenes, M.D.     BRIEF ADMITTING H & P:  78 year-old male with history of CAD status post stenting, mild aortic stenosis, peripheral vascular disease and hyperlipidemia has been experiencing suprapubic pain since yesterday morning. The pain slowly builds up and gets relieved only after he throws up. As it is been persistent so he came to the ER and blood work shows mildly elevated lipase but CAT scan does not show anything acute. Patient's pain is relieved by pain relief medications. Patient also and he has been having some urinary frequency but has been ongoing for last 5-6 years. Denies any dysphagia fever chills denies any chest pain shortness of breath cough or phlegm denies any focal deficits.   Hospital course   Abdominal pain (05/25/2011) Lower quadrant, suprapubic. Bladder on ct with increase thickening. Prostate gland enlarge. He is able to urinate. I will start ciprofloxacin and Tamsulosin. Less likely pancreatitis. I will start clears diet and advance as tolerated. I called [facility] they will call him to arrange outpatient follow up. [place] negative. I will treat for presume UTI and BPH.Less likely pancreatitis. Patient tolerating diet.   Presume UTI/ BPH: Patient with bladder thickening, suprapubic pain, Prostate enlargement by ct, urinary urgency. I started him on ciprofloxacin and Tamsulosin. He will need to follow with urologist.   HYPERLIPIDEMIA (05/18/2009) Continue with Zetia.    HYPERTENSION (05/18/2009) Continue with home medications.   CAD (05/18/2009) Continue with plavix and aspirin.      Disposition and Follow-up:  Discharge Orders    Future Orders Please Complete By Expires   Diet - low sodium heart healthy      Increase activity slowly        Follow-up Information    Please follow up. (Please follow with PCP.)       Please follow up. (Please call  [facility] to arrange appointment,  phone:  [phone])           DISCHARGE EXAM:  General: Alert, awake, oriented x3, in no acute distress.  HEENT: No bruits, no goiter.  Heart: Regular rate and rhythm, without murmurs, rubs, gallops.  Lungs: CTA, bilateral air movement.  Abdomen: Soft, nontender, nondistended, positive bowel sounds.  Neuro: Grossly intact, nonfocal.  Extremities; no edema.    Blood pressure 140/87, pulse 72, temperature 98.9 F (37.2 C), temperature source Oral, resp. rate 20, height 5\' 9"  (1.753 m), weight 72.2 kg (159 lb 2.8 oz), SpO2 94.00%.   Basename 05/25/11 0557 05/24/11 1925  NA 142 141  K 4.7 4.7  CL 110 104  CO2 26 24  GLUCOSE 103* 161*  BUN 26* 32*  CREATININE 1.07 1.20  CALCIUM 9.0 10.0  MG -- --  PHOS -- --    Basename 05/25/11 0557 05/24/11 1925  AST 80* 40*  ALT 47 28  ALKPHOS 57 72  BILITOT 0.6 0.6  PROT 6.3 7.8  ALBUMIN 3.5 4.6    Basename 05/25/11 0557 05/24/11 1925  LIPASE 31 80*  AMYLASE -- --    Basename 05/25/11 0557 05/24/11 1925  WBC 7.6 14.9*  NEUTROABS -- 12.2*  HGB 13.0 15.2  HCT 38.9* 44.1  MCV 89.6 88.2  PLT 203 238    Signed: Amiaya Mcneeley M.D. 05/26/2011, 9:29 AM

## 2011-05-26 NOTE — Progress Notes (Signed)
05/26/11 NSG 1415 Patient discharged to home with family, discharged instructions given and reviewed with patient.  Patient verbalized understanding, care notes given for new meds and pertinent education. Skin intact at discharge, IV discharged and intact. Patient ambulated down to car with nursing students.  Forbes Cellar, RN

## 2011-06-28 DIAGNOSIS — R3915 Urgency of urination: Secondary | ICD-10-CM | POA: Diagnosis not present

## 2011-06-28 DIAGNOSIS — R351 Nocturia: Secondary | ICD-10-CM | POA: Diagnosis not present

## 2011-07-06 ENCOUNTER — Encounter: Payer: Self-pay | Admitting: Internal Medicine

## 2011-07-06 DIAGNOSIS — M47816 Spondylosis without myelopathy or radiculopathy, lumbar region: Secondary | ICD-10-CM

## 2011-07-06 DIAGNOSIS — Z0001 Encounter for general adult medical examination with abnormal findings: Secondary | ICD-10-CM | POA: Insufficient documentation

## 2011-07-06 DIAGNOSIS — Z0000 Encounter for general adult medical examination without abnormal findings: Secondary | ICD-10-CM | POA: Insufficient documentation

## 2011-07-06 DIAGNOSIS — J449 Chronic obstructive pulmonary disease, unspecified: Secondary | ICD-10-CM | POA: Insufficient documentation

## 2011-07-06 HISTORY — DX: Spondylosis without myelopathy or radiculopathy, lumbar region: M47.816

## 2011-07-11 ENCOUNTER — Encounter: Payer: Self-pay | Admitting: Internal Medicine

## 2011-07-11 ENCOUNTER — Ambulatory Visit (INDEPENDENT_AMBULATORY_CARE_PROVIDER_SITE_OTHER): Payer: Medicare Other | Admitting: Internal Medicine

## 2011-07-11 VITALS — BP 138/70 | HR 66 | Temp 97.0°F | Ht 68.5 in | Wt 163.4 lb

## 2011-07-11 DIAGNOSIS — K219 Gastro-esophageal reflux disease without esophagitis: Secondary | ICD-10-CM | POA: Diagnosis not present

## 2011-07-11 DIAGNOSIS — M5137 Other intervertebral disc degeneration, lumbosacral region: Secondary | ICD-10-CM | POA: Diagnosis not present

## 2011-07-11 DIAGNOSIS — E785 Hyperlipidemia, unspecified: Secondary | ICD-10-CM | POA: Diagnosis not present

## 2011-07-11 DIAGNOSIS — M47816 Spondylosis without myelopathy or radiculopathy, lumbar region: Secondary | ICD-10-CM

## 2011-07-11 DIAGNOSIS — I10 Essential (primary) hypertension: Secondary | ICD-10-CM | POA: Diagnosis not present

## 2011-07-11 MED ORDER — OMEPRAZOLE 40 MG PO CPDR: 40.0000 mg | DELAYED_RELEASE_CAPSULE | Freq: Every day | ORAL | Status: DC

## 2011-07-11 MED ORDER — TRAMADOL HCL 50 MG PO TABS: 50.0000 mg | ORAL_TABLET | Freq: Four times a day (QID) | ORAL | Status: DC | PRN

## 2011-07-11 NOTE — Progress Notes (Signed)
Subjective:    Patient ID: Kristopher [name], male    DOB: 1930/04/13, 78 y.o.   MRN: [id number]  HPI  Here to establish as new pt;  C/o some right knee pain and stiffness worse in the AM, better to keep straight leg during the day, but also with recurring back pain, only really worse to mow the grass and bending at the waist frequently such as pulling weeds, across the low mid back, but no  bowel or bladder change, fever, wt loss,  worsening LE pain/numbness/weakness, gait change or falls.  Rest makes it better. Sitting in a chair too long such as a game on the computer approx 30 min lead to LBP .  Legs will go numb is sits in a car to drive 2-3 hrs.   Has seen urology, flomax not helping for urianry difficulty, trying now the [place] Likely for cystoscopy soon per pt.  Also to see card later this month - apr 16.  Also for Apr 11 vascular u/s.  Denies worsening reflux, dysphagia, abd pain, n/v, bowel change or blood, but needs med refill.  Pt denies chest pain, increased sob or doe, wheezing, orthopnea, PND, increased LE swelling, palpitations, dizziness or syncope.   Pt denies polydipsia, polyuria.  Recent feb 2013 labs reivewed with pt - cbc, lipids,  Cmet, lipase Past Medical History  Diagnosis Date  . Acute myocardial infarction, unspecified site, episode of care unspecified   . Unspecified essential hypertension   . Coronary atherosclerosis of unspecified type of vessel, native or graft   . Aortic stenosis   . Other and unspecified hyperlipidemia   . Peripheral vascular disease, unspecified   . Atherosclerosis of renal artery   . Pure hypercholesterolemia   . Esophageal reflux   . COPD (chronic obstructive pulmonary disease) 07/06/2011    By cxr  . DJD (degenerative joint disease), lumbar 07/06/2011  . Urine incontinence    Past Surgical History  Procedure Date  . Pci of lad 05/1996  . Hemorrhoid surgery 1954    reports that he quit smoking about 20 years ago. He has never used smokeless  tobacco. He reports that he does not drink alcohol or use illicit drugs. family history includes Heart disease in his father and mother. Allergies  Allergen Reactions  . Sulfa Antibiotics Rash   Current Outpatient Prescriptions on File Prior to Visit  Medication Sig Dispense Refill  . Ascorbic Acid (VITAMIN C) 500 MG tablet Take 500 mg by mouth 2 (two) times daily.       Marland Kitchen aspirin 325 MG EC tablet Take 325 mg by mouth daily.        . Cholecalciferol (VITAMIN D3) 1000 UNITS tablet Take 1,000 Units by mouth daily.       . clopidogrel (PLAVIX) 75 MG tablet Take 1 tablet (75 mg total) by mouth daily.  30 tablet  1  . co-enzyme Q-10 30 MG capsule Take 30 mg by mouth daily.        Marland Kitchen ezetimibe (ZETIA) 10 MG tablet Take 1 tablet (10 mg total) by mouth daily.  90 tablet  3  . fesoterodine (TOVIAZ) 4 MG TB24 Take 4 mg by mouth daily.      . fish oil-omega-3 fatty acids 1000 MG capsule Take 1 g by mouth 2 (two) times daily.       . Multiple Vitamin (MULTIVITAMIN) capsule Take 1 capsule by mouth daily.        Marland Kitchen omeprazole (PRILOSEC) 20 MG capsule Take  1 capsule (20 mg total) by mouth daily.  90 capsule  3  . ramipril (ALTACE) 10 MG capsule Take 1 capsule (10 mg total) by mouth daily.  90 capsule  3  . rosuvastatin (CRESTOR) 40 MG tablet Take 1 tablet (40 mg total) by mouth daily.  90 tablet  3  . vitamin E 400 UNIT capsule Take 400 Units by mouth daily.        . Flaxseed, Linseed, (FLAX SEED OIL PO) Take 1 tablet by mouth daily.       . Tamsulosin HCl (FLOMAX) 0.4 MG CAPS Take 1 capsule (0.4 mg total) by mouth daily after supper.  30 capsule  0   Review of Systems Review of Systems  Constitutional: Negative for diaphoresis, activity change, appetite change and unexpected weight change.  HENT: Negative for hearing loss, ear pain, facial swelling, mouth sores and neck stiffness.   Eyes: Negative for pain, redness and visual disturbance.  Respiratory: Negative for shortness of breath and wheezing.     Cardiovascular: Negative for chest pain and palpitations.  Gastrointestinal: Negative for diarrhea, blood in stool, abdominal distention and rectal pain.  Genitourinary: Negative for hematuria, flank pain and decreased urine volume.  Musculoskeletal: Negative for myalgias and joint swelling. other than above Skin: Negative for color change and wound.  Neurological: Negative for syncope and numbness.  Hematological: Negative for adenopathy.  Psychiatric/Behavioral: Negative for hallucinations, self-injury, decreased concentration and agitation.      Objective:   Physical Exam BP 138/70  Pulse 66  Temp(Src) 97 F (36.1 C) (Oral)  Ht 5' 8.5" (1.74 m)  Wt 163 lb 6 oz (74.106 kg)  BMI 24.48 kg/m2  SpO2 96% Physical Exam  VS noted Constitutional: Pt is oriented to person, place, and time. Appears well-developed and well-nourished.  HENT:  Head: Normocephalic and atraumatic.  Right Ear: External ear normal.  Left Ear: External ear normal.  Nose: Nose normal.  Mouth/Throat: Oropharynx is clear and moist.  Eyes: Conjunctivae and EOM are normal. Pupils are equal, round, and reactive to light.  Neck: Normal range of motion. Neck supple. No JVD present. No tracheal deviation present.  Cardiovascular: Normal rate, regular rhythm, normal heart sounds and intact distal pulses.   Pulmonary/Chest: Effort normal and breath sounds normal.  Abdominal: Soft. Bowel sounds are normal. There is no tenderness.  Musculoskeletal: Normal range of motion. Exhibits no edema.  Lymphadenopathy:  Has no cervical adenopathy.  Neurological: Pt is alert and oriented to person, place, and time. Pt has normal reflexes. No cranial nerve deficit.  Skin: Skin is warm and dry. No rash noted.  Psychiatric:  Has  normal mood and affect. Behavior is normal.     Assessment & Plan:

## 2011-07-11 NOTE — Assessment & Plan Note (Signed)
Current ongoing pain stable, tylenol no longer working, exam benign, will hold on further imaging, for tramadol prn,  to f/u any worsening symptoms or concerns

## 2011-07-11 NOTE — Assessment & Plan Note (Signed)
stable overall by hx and exam, most recent data reviewed with pt, and pt to continue medical treatment as before  Lab Results  Component Value Date   LDLCALC 53 05/25/2011

## 2011-07-11 NOTE — Patient Instructions (Signed)
Take all new medications as prescribed - the tramadol for pain as needed only You are given the omeprazole refill as you requested Your recent lab work and prevention measures are up to date Continue all other medications as before Please have the pharmacy call with any refills you may need. Please keep your appointments with your specialists as you have planned - ultrasound, cardiology and urology Please return in 6 months, or sooner if needed

## 2011-07-11 NOTE — Assessment & Plan Note (Signed)
stable overall by hx and exam, , and pt to continue medical treatment as before, for med refill  

## 2011-07-11 NOTE — Assessment & Plan Note (Signed)
stable overall by hx and exam, most recent data reviewed with pt, and pt to continue medical treatment as before  BP Readings from Last 3 Encounters:  07/11/11 138/70  05/26/11 110/64  07/21/10 110/52

## 2011-07-19 ENCOUNTER — Other Ambulatory Visit: Payer: Self-pay | Admitting: Cardiology

## 2011-07-19 DIAGNOSIS — I714 Abdominal aortic aneurysm, without rupture: Secondary | ICD-10-CM

## 2011-07-20 ENCOUNTER — Encounter (INDEPENDENT_AMBULATORY_CARE_PROVIDER_SITE_OTHER): Payer: Medicare Other

## 2011-07-20 DIAGNOSIS — I700 Atherosclerosis of aorta: Secondary | ICD-10-CM

## 2011-07-20 DIAGNOSIS — I714 Abdominal aortic aneurysm, without rupture: Secondary | ICD-10-CM | POA: Diagnosis not present

## 2011-07-25 ENCOUNTER — Encounter: Payer: Self-pay | Admitting: Cardiology

## 2011-07-25 ENCOUNTER — Ambulatory Visit (INDEPENDENT_AMBULATORY_CARE_PROVIDER_SITE_OTHER): Payer: Medicare Other | Admitting: Cardiology

## 2011-07-25 VITALS — BP 110/50 | HR 73 | Ht 68.5 in | Wt 163.4 lb

## 2011-07-25 DIAGNOSIS — E785 Hyperlipidemia, unspecified: Secondary | ICD-10-CM

## 2011-07-25 DIAGNOSIS — I701 Atherosclerosis of renal artery: Secondary | ICD-10-CM

## 2011-07-25 DIAGNOSIS — I10 Essential (primary) hypertension: Secondary | ICD-10-CM

## 2011-07-25 DIAGNOSIS — E7800 Pure hypercholesterolemia, unspecified: Secondary | ICD-10-CM

## 2011-07-25 DIAGNOSIS — I2510 Atherosclerotic heart disease of native coronary artery without angina pectoris: Secondary | ICD-10-CM

## 2011-07-25 DIAGNOSIS — I219 Acute myocardial infarction, unspecified: Secondary | ICD-10-CM

## 2011-07-25 DIAGNOSIS — I739 Peripheral vascular disease, unspecified: Secondary | ICD-10-CM

## 2011-07-25 MED ORDER — ROSUVASTATIN CALCIUM 40 MG PO TABS: 40.0000 mg | ORAL_TABLET | Freq: Every day | ORAL | Status: DC

## 2011-07-25 MED ORDER — EZETIMIBE 10 MG PO TABS: 10.0000 mg | ORAL_TABLET | Freq: Every day | ORAL | Status: DC

## 2011-07-25 MED ORDER — RAMIPRIL 10 MG PO CAPS: 10.0000 mg | ORAL_CAPSULE | Freq: Every day | ORAL | Status: DC

## 2011-07-25 NOTE — Patient Instructions (Signed)
Your physician wants you to follow-up in: 6 MONTHS WITH DR Jens Som You will receive a reminder letter in the mail two months in advance. If you don't receive a letter, please call our office to schedule the follow-up appointment.   Your physician has requested that you have a lexiscan myoview. For further information please visit [email or web address] Please follow instruction sheet, as given.   Your physician recommends that you return for lab work WITH STRESS TEST  STOP PLAVIX  REFERRAL TO DR Kirke Corin FOR PAD

## 2011-07-25 NOTE — Progress Notes (Signed)
HPI: Mr. Borman is a pleasant gentleman who has a history of coronary artery disease, status post PCI of his LAD performed in February 1998. A previous echocardiogram in April of 2012 revealed normal LV function. There was mild LAE. No significant AS. 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 Followup was recommended in one year. FU CT in Feb 2013 showed no aneurysm. Cardiac catheterization in February 2011 and showed an ejection fraction of 60% and no renal artery stenosis. He had a 90% right coronary artery but no obstructive disease in the left system. He had PCI of his right coronary artery with a drug-eluting stent at that time. I last saw him in April of 2012. Since then he has dyspnea on exertion but no orthopnea, PND, pedal edema. He does note some chest tightness with pushing his lawnmower but not routine activities. He also has bilateral lower extremity claudication with walking to the mailbox. This is worse compared to previous.  Current Outpatient Prescriptions  Medication Sig Dispense Refill  . Ascorbic Acid (VITAMIN C) 500 MG tablet Take 500 mg by mouth 2 (two) times daily.       Marland Kitchen aspirin 325 MG EC tablet Take 325 mg by mouth daily.        . clopidogrel (PLAVIX) 75 MG tablet Take 1 tablet (75 mg total) by mouth daily.  30 tablet  1  . co-enzyme Q-10 30 MG capsule Take 30 mg by mouth daily.        Marland Kitchen ezetimibe (ZETIA) 10 MG tablet Take 1 tablet (10 mg total) by mouth daily.  90 tablet  3  . fesoterodine (TOVIAZ) 4 MG TB24 Take 4 mg by mouth daily.      . fish oil-omega-3 fatty acids 1000 MG capsule Take 1 g by mouth 2 (two) times daily.       . Multiple Vitamin (MULTIVITAMIN) capsule Take 1 capsule by mouth daily.        Marland Kitchen omeprazole (PRILOSEC) 40 MG capsule Take 1 capsule (40 mg total) by mouth daily.  90 capsule  3  . ramipril (ALTACE) 10 MG capsule Take 1 capsule (10 mg  total) by mouth daily.  90 capsule  3  . rosuvastatin (CRESTOR) 40 MG tablet Take 1 tablet (40 mg total) by mouth daily.  90 tablet  3  . vitamin E 400 UNIT capsule Take 400 Units by mouth daily.           Past Medical History  Diagnosis Date  . Acute myocardial infarction, unspecified site, episode of care unspecified   . Unspecified essential hypertension   . Coronary atherosclerosis of unspecified type of vessel, native or graft   . Aortic stenosis   . Other and unspecified hyperlipidemia   . Peripheral vascular disease, unspecified   . Atherosclerosis of renal artery   . Pure hypercholesterolemia   . Esophageal reflux   . COPD (chronic obstructive pulmonary disease) 07/06/2011    By cxr  . DJD (degenerative joint disease), lumbar 07/06/2011  . Urine incontinence     Past Surgical History  Procedure Date  . Pci of lad 05/1996  . Hemorrhoid surgery 1954    History   Social History  . Marital Status: Married    Spouse Name: N/A    Number of Children: N/A  . Years of Education: GED   Occupational History  . retired  Social History Main Topics  . Smoking status: Former Smoker    Quit date: 04/11/1991  . Smokeless tobacco: Never Used  . Alcohol Use: No  . Drug Use: No  . Sexually Active: Not on file   Other Topics Concern  . Not on file   Social History Narrative  . No narrative on file    ROS: no fevers or chills, productive cough, hemoptysis, dysphasia, odynophagia, melena, hematochezia, dysuria, hematuria, rash, seizure activity, orthopnea, PND, pedal edema. Remaining systems are negative.  Physical Exam: Well-developed well-nourished in no acute distress.  Skin is warm and dry.  HEENT is normal.  Neck is supple. No thyromegaly.  Chest is clear to auscultation with normal expansion.  Cardiovascular exam is regular rate and rhythm.  Abdominal exam nontender or distended. No masses palpated. Extremities show no edema. neuro grossly intact  ECG NSR  with no ST changes.

## 2011-07-25 NOTE — Assessment & Plan Note (Signed)
Most recent echocardiogram showed no aortic stenosis.

## 2011-07-25 NOTE — Assessment & Plan Note (Signed)
Blood pressure controlled. Continue present medications. Check potassium and renal function. 

## 2011-07-25 NOTE — Assessment & Plan Note (Signed)
Recent CT showed no aneurysm.

## 2011-07-25 NOTE — Assessment & Plan Note (Signed)
Await followup renal Dopplers.

## 2011-07-25 NOTE — Assessment & Plan Note (Signed)
Continue aspirin and statin. Discontinue Plavix. He is complaining of some chest tightness with pushing lawnmower. Plan lexiscan Myoview for risk stratification.

## 2011-07-25 NOTE — Assessment & Plan Note (Signed)
Continue statin. Check lipids and liver. 

## 2011-07-25 NOTE — Assessment & Plan Note (Signed)
Continue aspirin and statin. He has known peripheral vascular disease. Will refer to Dr. Kirke Corin. He may require peripheral arteriogram and intervention.

## 2011-07-28 DIAGNOSIS — D4959 Neoplasm of unspecified behavior of other genitourinary organ: Secondary | ICD-10-CM | POA: Diagnosis not present

## 2011-07-28 DIAGNOSIS — R351 Nocturia: Secondary | ICD-10-CM | POA: Diagnosis not present

## 2011-07-28 DIAGNOSIS — R350 Frequency of micturition: Secondary | ICD-10-CM | POA: Diagnosis not present

## 2011-07-28 DIAGNOSIS — R3915 Urgency of urination: Secondary | ICD-10-CM | POA: Diagnosis not present

## 2011-07-31 ENCOUNTER — Other Ambulatory Visit (INDEPENDENT_AMBULATORY_CARE_PROVIDER_SITE_OTHER): Payer: Medicare Other

## 2011-07-31 ENCOUNTER — Ambulatory Visit (HOSPITAL_COMMUNITY): Payer: Medicare Other | Attending: Internal Medicine | Admitting: Radiology

## 2011-07-31 VITALS — BP 113/66 | Ht 68.0 in | Wt 157.0 lb

## 2011-07-31 DIAGNOSIS — I2510 Atherosclerotic heart disease of native coronary artery without angina pectoris: Secondary | ICD-10-CM | POA: Diagnosis not present

## 2011-07-31 DIAGNOSIS — E785 Hyperlipidemia, unspecified: Secondary | ICD-10-CM | POA: Insufficient documentation

## 2011-07-31 DIAGNOSIS — I10 Essential (primary) hypertension: Secondary | ICD-10-CM | POA: Diagnosis not present

## 2011-07-31 DIAGNOSIS — R079 Chest pain, unspecified: Secondary | ICD-10-CM | POA: Diagnosis not present

## 2011-07-31 DIAGNOSIS — E7800 Pure hypercholesterolemia, unspecified: Secondary | ICD-10-CM

## 2011-07-31 DIAGNOSIS — Z87891 Personal history of nicotine dependence: Secondary | ICD-10-CM | POA: Diagnosis not present

## 2011-07-31 DIAGNOSIS — I252 Old myocardial infarction: Secondary | ICD-10-CM | POA: Insufficient documentation

## 2011-07-31 DIAGNOSIS — R0989 Other specified symptoms and signs involving the circulatory and respiratory systems: Secondary | ICD-10-CM | POA: Insufficient documentation

## 2011-07-31 DIAGNOSIS — J449 Chronic obstructive pulmonary disease, unspecified: Secondary | ICD-10-CM | POA: Diagnosis not present

## 2011-07-31 DIAGNOSIS — R0609 Other forms of dyspnea: Secondary | ICD-10-CM | POA: Insufficient documentation

## 2011-07-31 DIAGNOSIS — I739 Peripheral vascular disease, unspecified: Secondary | ICD-10-CM | POA: Diagnosis not present

## 2011-07-31 DIAGNOSIS — R0789 Other chest pain: Secondary | ICD-10-CM | POA: Insufficient documentation

## 2011-07-31 DIAGNOSIS — J4489 Other specified chronic obstructive pulmonary disease: Secondary | ICD-10-CM | POA: Insufficient documentation

## 2011-07-31 MED ORDER — TECHNETIUM TC 99M TETROFOSMIN IV KIT
33.0000 | PACK | Freq: Once | INTRAVENOUS | Status: AC | PRN
  Administered 2011-07-31: 33 via INTRAVENOUS

## 2011-07-31 MED ORDER — TECHNETIUM TC 99M TETROFOSMIN IV KIT
11.0000 | PACK | Freq: Once | INTRAVENOUS | Status: AC | PRN
  Administered 2011-07-31: 11 via INTRAVENOUS

## 2011-07-31 MED ORDER — REGADENOSON 0.4 MG/5ML IV SOLN
0.4000 mg | Freq: Once | INTRAVENOUS | Status: AC
  Administered 2011-07-31: 0.4 mg via INTRAVENOUS

## 2011-07-31 NOTE — Progress Notes (Signed)
[facility] NUCLEAR MED [address] [place] [address] [phone]  Cardiology Nuclear Med Study  Kristopher [name] is a 78 y.o. male     MRN : [id number]     DOB: 27-Jun-1930  Procedure Date: 07/31/2011  Nuclear Med Background Indication for Stress Test:  Evaluation for Ischemia and Stent Patency History:  COPD,CT/MRI,4/12' ECHO EF:55-60%,2/11' Heart Catheterization RCA 90%-stent, non obs.dz EF 60%,Myocardial Infarction, 07' Myocardial Perfusion Study infer infart no ischemia EF:55%,98' Stents LAD-2011 RCA , CAD,AAA,AS Cardiac Risk Factors: Claudication, History of Smoking, Hypertension, Lipids and PVD  Symptoms:  Chest Pain with Exertion (last date of chest discomfort weeks ago), Chest Tightness with Exertion (last date of chest discomfort weeks ago) and DOE   Nuclear Pre-Procedure Caffeine/Decaff Intake:  7:30pm NPO After: 7:00pm   Lungs:  clear O2 Sat: 97% on room air. IV 0.9% NS with Angio Cath:  20g  IV Site: R Hand  IV Started by:  Cathlyn Parsons, RN  Chest Size (in):  40 Cup Size: n/a  Height: 5\' 8"  (1.727 m)  Weight:  157 lb (71.215 kg)  BMI:  Body mass index is 23.87 kg/(m^2). Tech Comments:  n/a    Nuclear Med Study 1 or 2 day study: 1 day  Stress Test Type:  Lexiscan  Reading MD: Dietrich Pates, MD  Order Authorizing Provider:  Ripley Fraise  Resting Radionuclide: Technetium 45m Tetrofosmin  Resting Radionuclide Dose: 11.0 mCi   Stress Radionuclide:  Technetium 46m Tetrofosmin  Stress Radionuclide Dose: 33.0 mCi           Stress Protocol Rest HR: 57 Stress HR: 81  Rest BP: 113/66 Stress BP: 128/70  Exercise Time (min): n/a METS: n/a   Predicted Max HR: 142 bpm % Max HR: 57.04 bpm Rate Pressure Product: 21308   Dose of Adenosine (mg):  n/a Dose of Lexiscan: 0.4 mg  Dose of Atropine (mg): n/a Dose of Dobutamine: n/a mcg/kg/min (at max HR)  Stress Test Technologist: Frederick Peers, EMT-P  Nuclear Technologist:  Domenic Polite,  CNMT     Rest Procedure:  Myocardial perfusion imaging was performed at rest 45 minutes following the intravenous administration of Technetium 84m Tetrofosmin. Rest ECG: SB  Stress Procedure:  The patient received IV Lexiscan 0.4 mg over 15-seconds.  Technetium 39m Tetrofosmin injected at 30-seconds.  There were no significant changes with Lexiscan.  Quantitative spect images were obtained after a 45 minute delay. Stress ECG: No significant change from baseline ECG  QPS Raw Data Images:  Images were motion corrected.  Soft tissue (diaphragm, bowel activity) surround heart. Stress Images:  Inferior thinning (base, mid)  Otherwise normal perfusion. Rest Images:  Comparison with the stress images reveals no significant change. Subtraction (SDS):  No evidence of ischemia. Transient Ischemic Dilatation (Normal <1.22): 0.91 Lung/Heart Ratio (Normal <0.45):  0.27  Quantitative Gated Spect Images QGS EDV:  75 ml QGS ESV:  33 ml  Impression Exercise Capacity:  Lexiscan with no exercise. BP Response:  Normal blood pressure response. Clinical Symptoms:  No chest pain. ECG Impression:  No significant ST segment change suggestive of ischemia. Comparison with Prior Nuclear Study: Report of inferior infarct not seen. Images not available to compare.  Overall Impression:  Probable normal perfusion with minimal soft tissue attenuation (diaphragm)  Overall low risk scan.  LV Ejection Fraction: 56%.  LV Wall Motion:  NL LV Function; NL Wall Motion  Dietrich Pates

## 2011-08-01 ENCOUNTER — Encounter: Payer: Self-pay | Admitting: *Deleted

## 2011-08-01 LAB — LIPID PANEL
Cholesterol: 114 mg/dL (ref 0–200)
HDL: 51.5 mg/dL (ref >39.00)
LDL Cholesterol: 46 mg/dL (ref 0–99)
Total CHOL/HDL Ratio: 2
Triglycerides: 82 mg/dL (ref 0.0–149.0)
VLDL: 16.4 mg/dL (ref 0.0–40.0)

## 2011-08-01 LAB — HEPATIC FUNCTION PANEL
ALT: 25 U/L (ref 0–53)
AST: 34 U/L (ref 0–37)
Albumin: 4.2 g/dL (ref 3.5–5.2)
Alkaline Phosphatase: 63 U/L (ref 39–117)
Bilirubin, Direct: 0 mg/dL (ref 0.0–0.3)
Total Bilirubin: 0.5 mg/dL (ref 0.3–1.2)
Total Protein: 7.2 g/dL (ref 6.0–8.3)

## 2011-08-01 LAB — BASIC METABOLIC PANEL
BUN: 22 mg/dL (ref 6–23)
CO2: 25 mEq/L (ref 19–32)
Calcium: 9.4 mg/dL (ref 8.4–10.5)
Chloride: 109 mEq/L (ref 96–112)
Creatinine, Ser: 1.3 mg/dL (ref 0.4–1.5)
GFR: 58.7 mL/min — ABNORMAL LOW (ref >60.00)
Glucose, Bld: 111 mg/dL — ABNORMAL HIGH (ref 70–99)
Potassium: 4.6 mEq/L (ref 3.5–5.1)
Sodium: 142 mEq/L (ref 135–145)

## 2011-08-02 ENCOUNTER — Encounter: Payer: Self-pay | Admitting: *Deleted

## 2011-08-03 ENCOUNTER — Other Ambulatory Visit: Payer: Self-pay | Admitting: Urology

## 2011-08-07 ENCOUNTER — Other Ambulatory Visit: Payer: Self-pay | Admitting: Urology

## 2011-08-07 ENCOUNTER — Encounter (HOSPITAL_COMMUNITY): Payer: Self-pay | Admitting: Pharmacy Technician

## 2011-08-09 ENCOUNTER — Encounter (HOSPITAL_COMMUNITY): Payer: Self-pay

## 2011-08-09 ENCOUNTER — Ambulatory Visit (HOSPITAL_COMMUNITY)
Admission: RE | Admit: 2011-08-09 | Discharge: 2011-08-09 | Disposition: A | Discharge status: Home / Self Care | Payer: Medicare Other | Source: Ambulatory Visit | Attending: Urology | Admitting: Urology

## 2011-08-09 ENCOUNTER — Encounter (HOSPITAL_COMMUNITY)
Admission: RE | Admit: 2011-08-09 | Discharge: 2011-08-09 | Disposition: A | Discharge status: Home / Self Care | Payer: Medicare Other | Source: Ambulatory Visit | Attending: Urology | Admitting: Urology

## 2011-08-09 DIAGNOSIS — J4489 Other specified chronic obstructive pulmonary disease: Secondary | ICD-10-CM | POA: Insufficient documentation

## 2011-08-09 DIAGNOSIS — I2510 Atherosclerotic heart disease of native coronary artery without angina pectoris: Secondary | ICD-10-CM | POA: Diagnosis not present

## 2011-08-09 DIAGNOSIS — K219 Gastro-esophageal reflux disease without esophagitis: Secondary | ICD-10-CM | POA: Diagnosis not present

## 2011-08-09 DIAGNOSIS — Z01812 Encounter for preprocedural laboratory examination: Secondary | ICD-10-CM | POA: Insufficient documentation

## 2011-08-09 DIAGNOSIS — N138 Other obstructive and reflux uropathy: Secondary | ICD-10-CM | POA: Diagnosis not present

## 2011-08-09 DIAGNOSIS — J449 Chronic obstructive pulmonary disease, unspecified: Secondary | ICD-10-CM | POA: Diagnosis not present

## 2011-08-09 DIAGNOSIS — R918 Other nonspecific abnormal finding of lung field: Secondary | ICD-10-CM | POA: Diagnosis not present

## 2011-08-09 DIAGNOSIS — R3911 Hesitancy of micturition: Secondary | ICD-10-CM | POA: Diagnosis not present

## 2011-08-09 DIAGNOSIS — Z01818 Encounter for other preprocedural examination: Secondary | ICD-10-CM | POA: Insufficient documentation

## 2011-08-09 DIAGNOSIS — N401 Enlarged prostate with lower urinary tract symptoms: Secondary | ICD-10-CM | POA: Insufficient documentation

## 2011-08-09 DIAGNOSIS — I10 Essential (primary) hypertension: Secondary | ICD-10-CM | POA: Insufficient documentation

## 2011-08-09 DIAGNOSIS — I252 Old myocardial infarction: Secondary | ICD-10-CM | POA: Insufficient documentation

## 2011-08-09 DIAGNOSIS — Z01811 Encounter for preprocedural respiratory examination: Secondary | ICD-10-CM | POA: Diagnosis not present

## 2011-08-09 HISTORY — DX: Pneumonia, unspecified organism: J18.9

## 2011-08-09 LAB — CBC
HCT: 42.4 % (ref 39.0–52.0)
Hemoglobin: 14.1 g/dL (ref 13.0–17.0)
MCH: 30 pg (ref 26.0–34.0)
MCHC: 33.3 g/dL (ref 30.0–36.0)
MCV: 90.2 fL (ref 78.0–100.0)
Platelets: 222 10*3/uL (ref 150–400)
RBC: 4.7 MIL/uL (ref 4.22–5.81)
RDW: 14.6 % (ref 11.5–15.5)
WBC: 7.3 10*3/uL (ref 4.0–10.5)

## 2011-08-09 LAB — SURGICAL PCR SCREEN
MRSA, PCR: NEGATIVE
Staphylococcus aureus: NEGATIVE

## 2011-08-09 NOTE — Patient Instructions (Addendum)
22 Kristopher [name]  08/09/2011   Your procedure is scheduled on:  08-11-11 FRIDAY  Report to [facility] at  0630 AM.  Call this number if you have problems the morning of surgery: [phone]   Remember:   Do not eat food or drink liquids:After Midnight.  .  Take these medicines the morning of surgery with A SIP OF WATER: OMEPRAZOLE, ZETIA, CRESTOR   Do not wear jewelry or make up.  Do not wear lotions, powders, or perfumes.Do not wear deodorant.    Do not bring valuables to the hospital.  Contacts, dentures or bridgework may not be worn into surgery.  Leave suitcase in the car. After surgery it may be brought to your room.  For patients admitted to the hospital, checkout time is 11:00 AM the day of discharge.     Special Instructions: bETASEPT Shower Use Special Wash: 1/2 bottle night before surgery and 1/2 bottle morning of surgery.neck down avoid private area   Please read over the following fact sheets that you were given: MRSA Information  [name] pre op nurse phone number [phone], call if needed

## 2011-08-09 NOTE — Pre-Procedure Instructions (Addendum)
BMET AND HEPATIC FUNCTION PANEL 07-31-2011 EPIC STRESS TEST 07-31-2011 EPIC EKG 07-25-2011 EPIC CARDIAC CATH 05-22-2009 EPIC 07-25-2011 LOV NOTE DR [facility] CARDIAC CLEARANCE NOTE 08-02-2011 DR CRENSHAW ON CHART

## 2011-08-11 ENCOUNTER — Encounter (HOSPITAL_COMMUNITY): Payer: Self-pay | Admitting: *Deleted

## 2011-08-11 ENCOUNTER — Encounter (HOSPITAL_COMMUNITY): Payer: Self-pay | Admitting: Anesthesiology

## 2011-08-11 ENCOUNTER — Encounter (HOSPITAL_COMMUNITY): Payer: Self-pay

## 2011-08-11 ENCOUNTER — Ambulatory Visit (HOSPITAL_COMMUNITY): Payer: Medicare Other | Admitting: Anesthesiology

## 2011-08-11 ENCOUNTER — Ambulatory Visit (HOSPITAL_COMMUNITY)
Admission: RE | Admit: 2011-08-11 | Discharge: 2011-08-12 | Disposition: A | Discharge status: Home / Self Care | Payer: Medicare Other | Source: Ambulatory Visit | Attending: Urology | Admitting: Urology

## 2011-08-11 ENCOUNTER — Encounter (HOSPITAL_COMMUNITY)
Admission: RE | Disposition: A | Discharge status: Home / Self Care | Payer: Self-pay | Source: Ambulatory Visit | Attending: Urology

## 2011-08-11 DIAGNOSIS — K219 Gastro-esophageal reflux disease without esophagitis: Secondary | ICD-10-CM | POA: Insufficient documentation

## 2011-08-11 DIAGNOSIS — Z79899 Other long term (current) drug therapy: Secondary | ICD-10-CM | POA: Insufficient documentation

## 2011-08-11 DIAGNOSIS — R3911 Hesitancy of micturition: Secondary | ICD-10-CM | POA: Insufficient documentation

## 2011-08-11 DIAGNOSIS — I252 Old myocardial infarction: Secondary | ICD-10-CM | POA: Diagnosis not present

## 2011-08-11 DIAGNOSIS — D4959 Neoplasm of unspecified behavior of other genitourinary organ: Secondary | ICD-10-CM | POA: Diagnosis not present

## 2011-08-11 DIAGNOSIS — Z7982 Long term (current) use of aspirin: Secondary | ICD-10-CM | POA: Insufficient documentation

## 2011-08-11 DIAGNOSIS — R39198 Other difficulties with micturition: Secondary | ICD-10-CM | POA: Insufficient documentation

## 2011-08-11 DIAGNOSIS — E7800 Pure hypercholesterolemia, unspecified: Secondary | ICD-10-CM | POA: Insufficient documentation

## 2011-08-11 DIAGNOSIS — N401 Enlarged prostate with lower urinary tract symptoms: Secondary | ICD-10-CM | POA: Diagnosis not present

## 2011-08-11 DIAGNOSIS — N400 Enlarged prostate without lower urinary tract symptoms: Secondary | ICD-10-CM | POA: Diagnosis not present

## 2011-08-11 DIAGNOSIS — I10 Essential (primary) hypertension: Secondary | ICD-10-CM | POA: Insufficient documentation

## 2011-08-11 DIAGNOSIS — N138 Other obstructive and reflux uropathy: Secondary | ICD-10-CM | POA: Diagnosis not present

## 2011-08-11 DIAGNOSIS — N3289 Other specified disorders of bladder: Secondary | ICD-10-CM | POA: Diagnosis not present

## 2011-08-11 HISTORY — PX: TRANSURETHRAL RESECTION OF PROSTATE: SHX73

## 2011-08-11 SURGERY — TRANSURETHRAL RESECTION OF THE PROSTATE WITH GYRUS INSTRUMENTS
Anesthesia: General | Wound class: Clean Contaminated

## 2011-08-11 MED ORDER — KETAMINE HCL 50 MG/ML IJ SOLN
INTRAMUSCULAR | Status: DC | PRN
  Administered 2011-08-11 (×3): 10 mg via INTRAMUSCULAR

## 2011-08-11 MED ORDER — SODIUM CHLORIDE 0.9 % IV SOLN: 250.0000 mL | INTRAVENOUS | Status: DC

## 2011-08-11 MED ORDER — LACTATED RINGERS IV SOLN
INTRAVENOUS | Status: DC | PRN
  Administered 2011-08-11 (×2): via INTRAVENOUS

## 2011-08-11 MED ORDER — GLYCOPYRROLATE 0.2 MG/ML IJ SOLN
INTRAMUSCULAR | Status: DC | PRN
  Administered 2011-08-11: .05 mg via INTRAVENOUS

## 2011-08-11 MED ORDER — MORPHINE SULFATE 2 MG/ML IJ SOLN: 1.0000 mg | INTRAMUSCULAR | Status: DC | PRN

## 2011-08-11 MED ORDER — PANTOPRAZOLE SODIUM 40 MG PO TBEC
40.0000 mg | DELAYED_RELEASE_TABLET | Freq: Every day | ORAL | Status: DC
  Filled 2011-08-11 (×2): qty 1

## 2011-08-11 MED ORDER — TERAZOSIN HCL 2 MG PO CAPS: 2.0000 mg | ORAL_CAPSULE | Freq: Every day | ORAL | Status: DC

## 2011-08-11 MED ORDER — HYDROCODONE-ACETAMINOPHEN 5-325 MG PO TABS: 1.0000 | ORAL_TABLET | Freq: Four times a day (QID) | ORAL | Status: AC | PRN

## 2011-08-11 MED ORDER — PROMETHAZINE HCL 25 MG/ML IJ SOLN: 6.2500 mg | INTRAMUSCULAR | Status: DC | PRN

## 2011-08-11 MED ORDER — DOCUSATE SODIUM 100 MG PO CAPS
100.0000 mg | ORAL_CAPSULE | Freq: Two times a day (BID) | ORAL | Status: DC
  Administered 2011-08-11 – 2011-08-12 (×2): 100 mg via ORAL
  Filled 2011-08-11 (×4): qty 1

## 2011-08-11 MED ORDER — BELLADONNA ALKALOIDS-OPIUM 16.2-60 MG RE SUPP
RECTAL | Status: AC
  Filled 2011-08-11: qty 1

## 2011-08-11 MED ORDER — PHENYLEPHRINE HCL 10 MG/ML IJ SOLN
INTRAMUSCULAR | Status: DC | PRN
  Administered 2011-08-11: 40 ug via INTRAVENOUS
  Administered 2011-08-11: 80 ug via INTRAVENOUS
  Administered 2011-08-11: 40 ug via INTRAVENOUS

## 2011-08-11 MED ORDER — DEXAMETHASONE SODIUM PHOSPHATE 10 MG/ML IJ SOLN
INTRAMUSCULAR | Status: DC | PRN
  Administered 2011-08-11: 10 mg via INTRAVENOUS

## 2011-08-11 MED ORDER — CEFAZOLIN SODIUM 1-5 GM-% IV SOLN
INTRAVENOUS | Status: AC
  Filled 2011-08-11: qty 50

## 2011-08-11 MED ORDER — CIPROFLOXACIN HCL 500 MG PO TABS
500.0000 mg | ORAL_TABLET | Freq: Two times a day (BID) | ORAL | Status: DC
  Administered 2011-08-11 – 2011-08-12 (×2): 500 mg via ORAL
  Filled 2011-08-11 (×4): qty 1

## 2011-08-11 MED ORDER — HYOSCYAMINE SULFATE 0.125 MG SL SUBL
0.1250 mg | SUBLINGUAL_TABLET | SUBLINGUAL | Status: DC | PRN
  Filled 2011-08-11: qty 1

## 2011-08-11 MED ORDER — LACTATED RINGERS IV SOLN: INTRAVENOUS | Status: DC

## 2011-08-11 MED ORDER — BACITRACIN-NEOMYCIN-POLYMYXIN 400-5-5000 EX OINT: 1.0000 "application " | TOPICAL_OINTMENT | Freq: Three times a day (TID) | CUTANEOUS | Status: DC | PRN

## 2011-08-11 MED ORDER — CIPROFLOXACIN HCL 250 MG PO TABS: 250.0000 mg | ORAL_TABLET | Freq: Two times a day (BID) | ORAL | Status: AC

## 2011-08-11 MED ORDER — DIPHENHYDRAMINE HCL 50 MG/ML IJ SOLN
INTRAMUSCULAR | Status: DC | PRN
  Administered 2011-08-11: 12.5 mg via INTRAVENOUS

## 2011-08-11 MED ORDER — HYDROCODONE-ACETAMINOPHEN 5-325 MG PO TABS
1.0000 | ORAL_TABLET | Freq: Four times a day (QID) | ORAL | Status: DC | PRN
  Administered 2011-08-11: 1 via ORAL
  Filled 2011-08-11: qty 1

## 2011-08-11 MED ORDER — BELLADONNA ALKALOIDS-OPIUM 16.2-60 MG RE SUPP
RECTAL | Status: DC | PRN
  Administered 2011-08-11: 1 via RECTAL

## 2011-08-11 MED ORDER — PROPOFOL 10 MG/ML IV BOLUS
INTRAVENOUS | Status: DC | PRN
  Administered 2011-08-11: 200 mg via INTRAVENOUS

## 2011-08-11 MED ORDER — SODIUM CHLORIDE 0.9 % IJ SOLN: 3.0000 mL | INTRAMUSCULAR | Status: DC | PRN

## 2011-08-11 MED ORDER — SODIUM CHLORIDE 0.9 % IR SOLN
Status: DC | PRN
  Administered 2011-08-11: 15000 mL

## 2011-08-11 MED ORDER — ATORVASTATIN CALCIUM 80 MG PO TABS
80.0000 mg | ORAL_TABLET | Freq: Every day | ORAL | Status: DC
  Filled 2011-08-11: qty 1

## 2011-08-11 MED ORDER — FENTANYL CITRATE 0.05 MG/ML IJ SOLN
INTRAMUSCULAR | Status: DC | PRN
  Administered 2011-08-11: 50 ug via INTRAVENOUS
  Administered 2011-08-11: 25 ug via INTRAVENOUS
  Administered 2011-08-11: 50 ug via INTRAVENOUS
  Administered 2011-08-11: 25 ug via INTRAVENOUS
  Administered 2011-08-11: 50 ug via INTRAVENOUS

## 2011-08-11 MED ORDER — ACETAMINOPHEN 10 MG/ML IV SOLN
INTRAVENOUS | Status: DC | PRN
  Administered 2011-08-11: 1000 mg via INTRAVENOUS

## 2011-08-11 MED ORDER — ONDANSETRON HCL 4 MG/2ML IJ SOLN: 4.0000 mg | INTRAMUSCULAR | Status: DC | PRN

## 2011-08-11 MED ORDER — SODIUM CHLORIDE 0.9 % IJ SOLN: 3.0000 mL | Freq: Two times a day (BID) | INTRAMUSCULAR | Status: DC

## 2011-08-11 MED ORDER — EPHEDRINE SULFATE 50 MG/ML IJ SOLN
INTRAMUSCULAR | Status: DC | PRN
  Administered 2011-08-11: 5 mg via INTRAVENOUS
  Administered 2011-08-11: 10 mg via INTRAVENOUS

## 2011-08-11 MED ORDER — ACETAMINOPHEN 325 MG PO TABS: 650.0000 mg | ORAL_TABLET | ORAL | Status: DC | PRN

## 2011-08-11 MED ORDER — FENTANYL CITRATE 0.05 MG/ML IJ SOLN: 25.0000 ug | INTRAMUSCULAR | Status: DC | PRN

## 2011-08-11 MED ORDER — LACTATED RINGERS IV SOLN
INTRAVENOUS | Status: DC
  Administered 2011-08-11: 1000 mL via INTRAVENOUS

## 2011-08-11 MED ORDER — LIDOCAINE HCL 2 % EX GEL
CUTANEOUS | Status: AC
  Filled 2011-08-11: qty 10

## 2011-08-11 MED ORDER — METOCLOPRAMIDE HCL 5 MG/ML IJ SOLN
INTRAMUSCULAR | Status: DC | PRN
  Administered 2011-08-11: 10 mg via INTRAVENOUS

## 2011-08-11 MED ORDER — LIDOCAINE HCL 2 % EX GEL
CUTANEOUS | Status: DC | PRN
  Administered 2011-08-11: 1 via URETHRAL

## 2011-08-11 MED ORDER — ESMOLOL HCL 10 MG/ML IV SOLN
INTRAVENOUS | Status: DC | PRN
  Administered 2011-08-11 (×4): 10 mg via INTRAVENOUS

## 2011-08-11 MED ORDER — LIDOCAINE HCL (CARDIAC) 20 MG/ML IV SOLN
INTRAVENOUS | Status: DC | PRN
  Administered 2011-08-11: 50 mg via INTRAVENOUS

## 2011-08-11 MED ORDER — DOCUSATE SODIUM 100 MG PO CAPS: 100.0000 mg | ORAL_CAPSULE | Freq: Two times a day (BID) | ORAL | Status: AC

## 2011-08-11 MED ORDER — EZETIMIBE 10 MG PO TABS
10.0000 mg | ORAL_TABLET | Freq: Every day | ORAL | Status: DC
  Administered 2011-08-12: 10 mg via ORAL
  Filled 2011-08-11 (×2): qty 1

## 2011-08-11 MED ORDER — CEFAZOLIN SODIUM 1-5 GM-% IV SOLN
1.0000 g | INTRAVENOUS | Status: AC
  Administered 2011-08-11: 1 g via INTRAVENOUS

## 2011-08-11 MED ORDER — SODIUM CHLORIDE 0.9 % IR SOLN
3000.0000 mL | Status: DC
  Administered 2011-08-11: 3000 mL

## 2011-08-11 MED ORDER — RAMIPRIL 10 MG PO CAPS
10.0000 mg | ORAL_CAPSULE | Freq: Every day | ORAL | Status: DC
  Administered 2011-08-12: 10 mg via ORAL
  Filled 2011-08-11 (×2): qty 1

## 2011-08-11 MED ORDER — ACETAMINOPHEN 10 MG/ML IV SOLN
INTRAVENOUS | Status: AC
  Filled 2011-08-11: qty 100

## 2011-08-11 MED ORDER — ONDANSETRON HCL 4 MG/2ML IJ SOLN
INTRAMUSCULAR | Status: DC | PRN
  Administered 2011-08-11: 4 mg via INTRAVENOUS

## 2011-08-11 SURGICAL SUPPLY — 24 items
BAG URINE DRAINAGE (UROLOGICAL SUPPLIES) ×2 IMPLANT
BAG URO CATCHER STRL LF (DRAPE) ×2 IMPLANT
BLADE SURG 15 STRL LF DISP TIS (BLADE) IMPLANT
BLADE SURG 15 STRL SS (BLADE)
CATH FOLEY 3WAY 30CC 22FR (CATHETERS) ×3 IMPLANT
CLOTH BEACON ORANGE TIMEOUT ST (SAFETY) ×2 IMPLANT
DRAPE CAMERA CLOSED 9X96 (DRAPES) ×2 IMPLANT
ELECT BUTTON HF 24-28F 2 30DE (ELECTRODE) ×1 IMPLANT
ELECT HF RESECT BIPO 24F 45 ND (CUTTING LOOP) ×1 IMPLANT
ELECT LOOP MED HF 24F 12D (CUTTING LOOP) ×2 IMPLANT
ELECT RESECT VAPORIZE 12D CBL (ELECTRODE) ×1 IMPLANT
EVACUATOR MICROVAS BLADDER (UROLOGICAL SUPPLIES) ×2 IMPLANT
GLOVE BIOGEL M STRL SZ7.5 (GLOVE) ×2 IMPLANT
GOWN STRL NON-REIN LRG LVL3 (GOWN DISPOSABLE) ×2 IMPLANT
GOWN STRL REIN XL XLG (GOWN DISPOSABLE) ×2 IMPLANT
HOLDER FOLEY CATH W/STRAP (MISCELLANEOUS) IMPLANT
IV NS IRRIG 3000ML ARTHROMATIC (IV SOLUTION) ×8 IMPLANT
KIT ASPIRATION TUBING (SET/KITS/TRAYS/PACK) ×2 IMPLANT
MANIFOLD NEPTUNE II (INSTRUMENTS) ×2 IMPLANT
PACK CYSTO (CUSTOM PROCEDURE TRAY) ×2 IMPLANT
SUT ETHILON 3 0 PS 1 (SUTURE) IMPLANT
SYR 30ML LL (SYRINGE) IMPLANT
SYRINGE IRR TOOMEY STRL 70CC (SYRINGE) ×1 IMPLANT
TUBING CONNECTING 10 (TUBING) ×2 IMPLANT

## 2011-08-11 NOTE — Preoperative (Signed)
Beta Blockers   Reason not to administer Beta Blockers:Not Applicable 

## 2011-08-11 NOTE — Anesthesia Postprocedure Evaluation (Signed)
Anesthesia Post Note  Patient: Kristopher [name]  Procedure(s) Performed: Procedure(s) (LRB): TRANSURETHRAL RESECTION OF THE PROSTATE WITH GYRUS INSTRUMENTS (N/A)  Anesthesia type: General  Patient location: PACU  Post pain: Pain level controlled  Post assessment: Post-op Vital signs reviewed  Last Vitals:  Filed Vitals:   08/11/11 1115  BP: 152/66  Pulse: 88  Temp: 36.1 C  Resp: 12    Post vital signs: Reviewed  Level of consciousness: sedated  Complications: No apparent anesthesia complications

## 2011-08-11 NOTE — H&P (Signed)
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 Patient has had severe frequency and urgency associated with straining and weak stream for several years.   Cardiac clearance given as Low Risk by Dr. Jens Som.   Patient was put on Rapaflo and he took it for 7 days. He reports better flow and less frequency and urgency.    Past Medical History Problems  1. History of  Acute Myocardial Infarction V12.59 2. History of  Esophageal Reflux 530.81 3. History of  Hypercholesterolemia 272.0 4. History of  Hypertension 401.9  Surgical History Problems  1. History of  Heart Surgery 2. History of  Hemorrhoidectomy 3. History of  Surgery Vas Deferens Vasovasostomy  Current Meds 1. Altace 10 MG Oral Capsule; Therapy: (Recorded:20Mar2013) to 2. Aspirin 325 MG Oral Tablet; Therapy: (Recorded:20Mar2013) to 3. Co Q 10 CAPS; Therapy: (Recorded:20Mar2013) to 4. Crestor 40 MG Oral Tablet; Therapy: (Recorded:20Mar2013) to 5. Fish Oil CAPS; Therapy: (Recorded:20Mar2013) to 6. Multi-Vitamin TABS; Therapy: (Recorded:20Mar2013) to 7. Omeprazole 20 MG Oral Tablet Delayed Release; Therapy: (Recorded:20Mar2013) to 8. Toviaz 4 MG Oral Tablet Extended Release 24 Hour; TAKE 1 TABLET DAILY; Therapy: 08Apr2013  to (Evaluate:03Apr2014)  Requested for: 08Apr2013; Last Rx:08Apr2013 9. Vitamin C TABS; Therapy: (Recorded:20Mar2013) to 10. Vitamin E TABS; Therapy: (Recorded:20Mar2013) to 11. Zetia TABS; Therapy: (Recorded:20Mar2013) to  Allergies Medication  1. Sulfa Drugs  Family History Problems  1. Family history of  Death In The Family Father 2. Family history of  Death In The Family Mother 3. Family history of  Family Health Status Number Of Children 2 sons 2 daughters  Social History Problems  1. Caffeine Use 5 coffee per day 2. Former Smoker V15.82 smoked 2ppd for 14yrs quit 46yrs ago 3. Marital History - Widowed 4. Retired From Work Denied  5.  History of  Alcohol Use  Review of Systems Constitutional, skin, eye, otolaryngeal, hematologic/lymphatic, cardiovascular, pulmonary, endocrine, musculoskeletal, gastrointestinal, neurological and psychiatric system(s) were reviewed and pertinent findings if present are noted.  Constitutional: feeling tired (fatigue).  Cardiovascular: chest pain.  Respiratory: shortness of breath.  Musculoskeletal: back pain and joint pain.        Results/Data Urine [Data Includes: Last 1 Day]   19Apr2013  COLOR YELLOW   APPEARANCE CLEAR   SPECIFIC GRAVITY 1.020   pH 6.0   GLUCOSE NEG mg/dL  BILIRUBIN NEG   KETONE NEG mg/dL  BLOOD NEG   PROTEIN NEG mg/dL  UROBILINOGEN 1 mg/dL  NITRITE NEG   LEUKOCYTE ESTERASE TRACE   SQUAMOUS EPITHELIAL/HPF NONE SEEN   WBC 4-6 WBC/hpf  RBC NONE SEEN RBC/hpf  BACTERIA NONE SEEN   CRYSTALS NONE SEEN   CASTS NONE SEEN        Assessment Assessed  1. 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 Feelings Of Urinary Urgency 788.63  Plan Health Maintenance (V70.0)  1. UA With REFLEX  Done: 19Apr2013 02:08PM  Discussion/Summary Labs, vitals reviewed.   PE: NAD, A&O x 3 CV - RRR Resp - nl effort, depth Abd - soft, NT Ext - no edema       I discussed the cystoscopic findings with the patient and his wife. There is concern for prostatic or bladder neoplasm/cancer although we discussed he may have BPH with reactive urothelium (benign process).   Using the understanding TURP handout we went over the nature risks benefits and alternatives to transurethral resection of the prostate. Discussed doing nothing, medication such as blocker and 5 for about 2 cm. Also  discussed alternative procedures such as microwave or green light laser. All questions answered he elects to proceed with transurethral resection of the prostate to fluid give him some relief from his lower urinary tract symptoms as he does have bladder outlet obstruction. I discussed my main concern with  him as worsening urgency and frequency and possible incontinence given his irritative symptoms now. We discussed we may need to continue to address these if they continue or worsen after TURP. We also discussed risks such as bleeding and retrograde ejaculation among others. All questions answered and he elects to proceed.

## 2011-08-11 NOTE — Brief Op Note (Signed)
08/11/2011  10:21 AM  PATIENT:  Kristopher [name]  78 y.o. male  PRE-OPERATIVE DIAGNOSIS:  Benign prostatic hypertrophy, urinary hesitancy/weak stream  POST-OPERATIVE DIAGNOSIS:  Prostate neoplasm, bladder neoplasm,  urinary hesitancy/weak stream  PROCEDURE:  Procedure(s) (LRB): TRANSURETHRAL RESECTION OF THE PROSTATE and bladder tumor WITH GYRUS INSTRUMENTS (N/A)  SURGEON:  Surgeon(s) and Role:    * Antony Haste, MD - Primary  ANESTHESIA:   general  Findings - EUA,    EBL: minimal  DRAINS: Urinary Catheter (Foley)  - 22Fr 3 way foley with CBI for wake up/transfer  LOCAL MEDICATIONS USED:  B&O PR  SPECIMEN:  Biopsy / Limited Resection  DISPOSITION OF SPECIMEN:  PATHOLOGY  DICTATION: [id number]  PLAN OF CARE: Admit for overnight observation  PATIENT DISPOSITION:  PACU - hemodynamically stable.   Delay start of Pharmacological VTE agent (>24hrs) due to surgical blood loss or risk of bleeding: SCD's , high bleed risk

## 2011-08-11 NOTE — Anesthesia Preprocedure Evaluation (Addendum)
Anesthesia Evaluation  Patient identified by MRN, date of birth, ID band Patient awake    Reviewed: Allergy & Precautions, H&P , NPO status , Patient's Chart, lab work & pertinent test results  History of Anesthesia Complications Negative for: history of anesthetic complications  Airway Mallampati: I TM Distance: >3 FB Neck ROM: Full    Dental  (+) Edentulous Upper, Edentulous Lower and Dental Advisory Given   Pulmonary shortness of breath and with exertion, pneumonia , COPDformer smoker (114 pac years) breath sounds clear to auscultation  Pulmonary exam normal       Cardiovascular hypertension, - angina+ CAD, + Past MI (Approx. 10 years ago) and + Cardiac Stents (PCI of LAD 1998: caridac stent right CA 05/2009) Rhythm:Regular Rate:Normal  Aortic stenosis; ASPVD with renal artery stenosis   Neuro/Psych Peripheral neuropathy negative psych ROS   GI/Hepatic Neg liver ROS, GERD-  Medicated,  Endo/Other  negative endocrine ROS  Renal/GU Renal artery stenosis  negative genitourinary   Musculoskeletal negative musculoskeletal ROS (+)   Abdominal (+) + scaphoid   Peds negative pediatric ROS (+)  Hematology negative hematology ROS (+)   Anesthesia Other Findings   Reproductive/Obstetrics negative OB ROS                         Anesthesia Physical Anesthesia Plan  ASA: III  Anesthesia Plan: General   Post-op Pain Management:    Induction:   Airway Management Planned: LMA  Additional Equipment:   Intra-op Plan:   Post-operative Plan: Extubation in OR  Informed Consent: I have reviewed the patients History and Physical, chart, labs and discussed the procedure including the risks, benefits and alternatives for the proposed anesthesia with the patient or authorized representative who has indicated his/her understanding and acceptance.   Dental advisory given  Plan Discussed with:  CRNA  Anesthesia Plan Comments:         Anesthesia Quick Evaluation

## 2011-08-11 NOTE — Discharge Instructions (Signed)
Transurethral Resection of the Prostate/Bladder Care After Refer to this sheet in the next few weeks. These discharge instructions provide you with general information on caring for yourself after you leave the hospital. Your caregiver may also give you specific instructions. Your treatment has been planned according to the most current medical practices available, but unavoidable complications sometimes occur. If you have any problems or questions after discharge, please call your caregiver. HOME CARE INSTRUCTIONS  Medications  You will receive medicine for pain management. As your level of discomfort decreases, adjustments in your pain medicines may be made.   Since it is important that you do deep breathing and coughing exercises and increase your activity, it may be helpful to take pain medicines prior to these activities. Take all medicines as directed.   You may be given a medicine (antibiotic) to kill germs following surgery. Finish all medicines. Let your caregiver know if you have any side effects or problems from the medicine.  Hygiene  You can take a shower after surgery.   You should not take a bath while you still have the urethral catheter.  Activity  You will be encouraged to get out of bed as much as possible and increase your activity level as tolerated.   Spend the first week in and around your home. For 10 days, avoid the following:   Straining.   Running.   Strenuous work.   Walks longer than a few blocks.   Riding for extended periods.   Sexual relations.   Do not lift heavy objects (more than 5 pounds/2.25 kilograms) for at least 1 month. When lifting, use your arms instead of your abdominal muscles.   You will be encouraged to walk as tolerated. Do not exert yourself. Increase your activity level slowly. Remember that it is important to keep moving after an operation of any type. This cuts down on the possibility of developing blood clots.   Your caregiver  will tell you when you can resume driving and light housework. Discuss this at your first office visit after discharge.  Diet  No special diet is ordered after a TURP. However, if you are on a special diet for another medical problem, it should be continued.   Normal fluid intake is usually recommended.   Avoid alcohol and caffeinated drinks for 2 weeks. They irritate the bladder. Decaffeinated drinks are okay.   Avoid spicy foods.  Bladder Function  For the first 10 days, empty the bladder whenever you feel a definite desire. Do not try to hold the urine for long periods of time.   Urinating once or twice a night even after you are healed is not uncommon.   You may see some recurrence of blood in the urine after discharge from the hospital. If this occurs, force fluids again as you did in the hospital and reduce your activity.  Bowel Function  You may experience some constipation after surgery. This can be minimized by increasing fluids and fiber in your diet. Drink enough water and fluids to keep your urine clear or pale yellow.   A stool softener may be prescribed for use at home. Do not strain to move your bowels.   If you are requiring increased pain medicine, it is important that you take stool softeners to prevent constipation. This will help to promote proper healing by reducing the need to strain to move your bowels.  Sexual Activity  Semen movement in the opposite direction and into the bladder (retrograde ejaculation) may  occur. Since the semen passes into the bladder, cloudy urine can occur the first time you urinate after intercourse. Or, you may not have an ejaculation during erection. Ask your caregiver when you can resume sexual activity. Retrograde ejaculation and reduced semen discharge should not reduce one's pleasure of intercourse.  Postoperative Visit  Arrange the date and time of your after surgery visit with your caregiver.  Return to Work  After your recovery  is complete, you will be able to return to work and resume all activities. Your caregiver will inform you when you can return to work.  Foley Catheter Care A soft, flexible tube (Foley catheter) has been placed in your bladder to drain urine and fluid. Follow these instructions: Taking Care of the Catheter  Keep the area where the catheter leaves your body clean.   Attach the catheter to the leg so there is no tension on the catheter.   Keep the drainage bag below the level of the bladder, but keep it OFF the floor.   Do not take long soaking baths. Your caregiver will give instructions about showering.   Wash your hands before touching ANYTHING related to the catheter or bag.   Using mild soap and warm water on a washcloth:   Clean the area closest to the catheter insertion site using a circular motion around the catheter.   Clean the catheter itself by wiping AWAY from the insertion site for several inches down the tube.   NEVER wipe upward as this could sweep bacteria up into the urethra (tube in your body that normally drains the bladder) and cause infection.   Place a small amount of sterile lubricant at the tip of the penis where the catheter is entering.  Taking Care of the Drainage Bags  Two drainage bags will be taken home: a large overnight drainage bag, and a smaller leg bag which fits underneath clothing.   It is okay to wear the overnight bag at any time, but NEVER wear the smaller leg bag at night.   Keep the drainage bag well below the level of your bladder. This prevents backflow of urine into the bladder and allows the urine to drain freely.   Anchor the tubing to your leg to prevent pulling or tension on the catheter. Use tape or a leg strap provided by the hospital.   Empty the drainage bag when it is 1/2 to 3/4 full. Wash your hands before and after touching the bag.   Periodically check the tubing for kinks to make sure there is no pressure on the tubing which  could restrict the flow of urine.  Changing the Drainage Bags  Cleanse both ends of the clean bag with alcohol before changing.   Pinch off the rubber catheter to avoid urine spillage during the disconnection.   Disconnect the dirty bag and connect the clean one.   Empty the dirty bag carefully to avoid a urine spill.   Attach the new bag to the leg with tape or a leg strap.  Cleaning the Drainage Bags  Whenever a drainage bag is disconnected, it must be cleaned quickly so it is ready for the next use.   Wash the bag in warm, soapy water.   Rinse the bag thoroughly with warm water.   Soak the bag for 30 minutes in a solution of white vinegar and water (1 cup vinegar to 1 quart warm water).   Rinse with warm water.  SEEK MEDICAL CARE IF:  You have chills or night sweats.   You are leaking around your catheter or have problems with your catheter. It is not uncommon to have sporadic leakage around your catheter as a result of bladder spasms. If the leakage stops, there is not much need for concern. If you are uncertain, call your caregiver.   You develop side effects that you think are coming from your medicines.  SEEK IMMEDIATE MEDICAL CARE IF:   You are suddenly unable to urinate. Check to see if there are any kinks in the drainage tubing that may cause this. If you cannot find any kinks, call your caregiver immediately. This is an emergency.   You develop shortness of breath or chest pains.   Bleeding persists or clots develop in your urine.   You have a fever.   You develop pain in your back or over your lower belly (abdomen).   You develop pain or swelling in your legs.   Any problems you are having get worse rather than better.  MAKE SURE YOU:   Understand these instructions.   Will watch your condition.   Will get help right away if you are not doing well or get worse.  Document Released: 03/27/2005 Document Revised: 03/16/2011 Document Reviewed:  11/18/2008 [employer] Patient Information 2012 [employer]. 

## 2011-08-11 NOTE — Transfer of Care (Signed)
Immediate Anesthesia Transfer of Care Note  Patient: Kristopher [name]  Procedure(s) Performed: Procedure(s) (LRB): TRANSURETHRAL RESECTION OF THE PROSTATE WITH GYRUS INSTRUMENTS (N/A)  Patient Location: PACU  Anesthesia Type: General  Level of Consciousness: sedated, patient cooperative and responds to stimulation  Airway & Oxygen Therapy: Patient Spontanous Breathing and Patient connected to face mask oxygen  Post-op Assessment: Report given to PACU RN, Post -op Vital signs reviewed and stable and Patient moving all extremities  Post vital signs: Reviewed and stable  Complications: No apparent anesthesia complications

## 2011-08-11 NOTE — Anesthesia Procedure Notes (Signed)
Procedure Name: LMA Insertion Date/Time: 08/11/2011 8:50 AM Performed by: Randon Goldsmith Kristopher [name] Pre-anesthesia Checklist: Patient identified, Emergency Drugs available, Suction available and Patient being monitored Patient Re-evaluated:Patient Re-evaluated prior to inductionOxygen Delivery Method: Circle system utilized Preoxygenation: Pre-oxygenation with 100% oxygen Intubation Type: IV induction Ventilation: Mask ventilation without difficulty LMA: LMA inserted LMA Size: 5.0 Tube type: Oral Number of attempts: 1 Placement Confirmation: positive ETCO2,  CO2 detector and breath sounds checked- equal and bilateral Tube secured with: Tape Dental Injury: Teeth and Oropharynx as per pre-operative assessment

## 2011-08-12 MED ORDER — HYDROCODONE-ACETAMINOPHEN 5-325 MG PO TABS: 1.0000 | ORAL_TABLET | Freq: Four times a day (QID) | ORAL | Status: DC | PRN

## 2011-08-12 MED ORDER — CIPROFLOXACIN HCL 500 MG PO TABS: 500.0000 mg | ORAL_TABLET | Freq: Two times a day (BID) | ORAL | Status: AC

## 2011-08-12 MED ORDER — SODIUM CHLORIDE 0.9 % IV SOLN
INTRAVENOUS | Status: DC
  Administered 2011-08-12: 01:00:00 via INTRAVENOUS

## 2011-08-12 MED ORDER — HYOSCYAMINE SULFATE 0.125 MG SL SUBL: 0.1250 mg | SUBLINGUAL_TABLET | SUBLINGUAL | Status: DC | PRN

## 2011-08-12 NOTE — Progress Notes (Signed)
D/C'd pt's foley catheter and instructed to call us when he has urge to urinate. Three bottles placed in pt's room to evaluate urine.

## 2011-08-12 NOTE — Op Note (Signed)
NAMESPENSER, HARREN NO.:  [id number]  MEDICAL RECORD NO.:  [id number]  LOCATION:  1435                         FACILITY:  [facility]  PHYSICIAN:  Jerilee Field, MD   DATE OF BIRTH:  09-10-1930  DATE OF PROCEDURE: DATE OF DISCHARGE:                              OPERATIVE REPORT   PREOPERATIVE DIAGNOSES: 1. Urinary hesitancy and weak stream. 2. Benign prostatic hypertrophy.  POSTOPERATIVE DIAGNOSES: 1. Prostate neoplasm. 2. Bladder neoplasm. 3. Urinary hesitancy and weak stream.  PROCEDURE:  Transurethral resection of prostate and bladder tumor with Gyrus bipolar.  SURGEON:  Jerilee Field, MD  TYPE OF ANESTHESIA:  General.  INDICATION FOR PROCEDURE:  Mr. Hermann is a 78 year old male who has had severe lower urinary tract symptoms consisting of urinary hesitancy, intermittent slow weak stream, frequency, and urgency.  On cystoscopy in the office, there was a large tumor at the anterior bladder neck with papillary extension into the bladder.  It was difficult to tell if this was bladder or prostatic in origin.  I discussed these findings with the patient.  We discussed the nature, risks, and benefits of transurethral resection.  He was having quite a bit of frequency and urgency, and we discussed this could persist or could be worse after surgery as well as he could have incontinence among other risks.  All questions were answered, and he elected to proceed with transurethral resection.  FINDINGS:  On exam under anesthesia, the abdomen was soft, nontender, without masses.  The penis and testicles are normal without mass or lesion.  On digital rectal exam and bimanual exam, the prostate was not enlarged, but was hard and indurated.  It was difficult to tell if the palpable abnormality was in the peripheral zone or the anterior tumor being palpable through the rectum.  I suspect that it was anterior tumor because on cystoscopy, this nodule was quite  defined as it was palpably and it was very mobile.  The mass was mobile both cystoscopically and on bimanual exam.  On cystoscopy the verumontanum was normal.  The median lobe and bladder neck did not appear enlarged.  The right lateral lobe did not appear enlarged and visibly was a large mass extending from the 11 o'clock on the bladder neck down to the 5 o'clock on the patient's left side.  Initially, I had trouble even getting the scope around this mass defined its bladder neck and this could only be done along the right side of the mass due to its filling and obstructing the prostatic urethra.  Photographs were taken.  As the mass extended up toward the bladder neck, there were papillary extensions going into the bladder, either reactive or neoplastic.  The bladder itself appeared normal apart from severe trabeculation, and there were no bladder masses, stones, or other lesions.  The trigone and ureteral orifices were normal.  The bladder was inspected with the 12 and 70-degree lens.  The mass extending from the bladder neck into the prostate was quite defined and easy to trace, although very large.  It was resected to create a good channel through the prostatic urethra with careful attention around the left side of the mass toward  the apex.  Resection and visualization were excellent.  The mass was quite firm and mobile and well defined nodular mass.  I felt rather hard and solid as it did on the bimanual exam and was not a soft and fluffy as typical BPH tissue.  DESCRIPTION OF PROCEDURE:  After consent was obtained, the patient was brought to the operating room, time-out was performed to confirm the patient and procedure.  After adequate anesthesia, he was placed in lithotomy position.  Exam under anesthesia was performed.  He was then prepped and draped in the usual fashion.  A cystoscope was passed per urethra, and the bladder and urethra were examined with 12 and  70-degree lenses and several photographs were taken.  After cystoscopic inspection, the scope was removed.  The urethra was carefully dilated to 28-French and the 26-French resectoscope was passed with the visual obturator.  The resectoscope handles then placed using the loop and began to resect the mass at the bladder neck on the right side at the 11 o'clock position.  The initial resection revealed a very solid well- defined mass.  I took the mass down from top to bottom, bladder neck down to verumontanum.  The prostatic urethra and bladder neck were completely spared on the floor and right side of the prostate and bladder neck.  These areas also appeared normal.  The mass extended again from 11 down around to 5 o'clock complete from bladder neck to verumontanum.  The mass was carefully resected and once adequate resection was performed to provide an unobstructed bladder outlet, resection was terminated.  All the chips were removed.  Adequate hemostasis was ensured.  There were some residual extensions into the bladder neck at 2 o'clock and these were resected without difficulty, and appeared shallow.  Again adequate hemostasis was ensured.  The mass extended around down toward the apex from 12 o'clock around the 5 o'clock on the patient's left side, and again, this area was very careful to resect tissue for diagnosis and to create an open channel, but not remove all the tissue in this area.  After adequate hemostasis was ensured, the bladder was inspected and noted to be free of chips. The trigone and ureteral orifices were noted to be normal.  Again, the bladder neck itself was not resected and a typical curved fashion as it did not need to be.  It was widely patent.  The scope was removed and a 22-French catheter was placed without difficulty, draining clear urine. The balloon was inflated and seated at the bladder neck.  He was connected to CBI just for wake up and  transfer.  COMPLICATIONS:  None.  DRAINS:  A 22-French 3-way Foley.  SPECIMENS:  TURP and TURBT chips.  DISPOSITION:  The patient stable to PACU.          ______________________________ Jerilee Field, MD     ME/MEDQ  D:  08/11/2011  T:  08/12/2011  Job:  [id number]

## 2011-08-12 NOTE — Discharge Summary (Signed)
Physician Discharge Summary  Patient ID: Kristopher [name] MRN: [id number] DOB/AGE: December 17, 1930 78 y.o.  Admit date: 08/11/2011 Discharge date: 08/12/2011  Admission Diagnoses: BPH Discharge Diagnoses:  Active Problems:  * No active hospital problems. *    Discharged Condition: good Stable Hospital Course: 23 hr OBS status post TURP/TURBT by Dr. Jerilee Field 08/11/11. Pain has been controlled with po hydrocodone. VSS. Foley removed this am @ 0600. Has now voided X 3 with hematuria w/o clots.   Consults: None  Significant Diagnostic Studies: labs:   Treatments: surgery: TURP/TURBT 08/11/11  Discharge Exam: Blood pressure 167/4, pulse 56, temperature 97.6 F (36.4 C), temperature source Oral, resp. rate 20, height 5\' 8"  (1.727 m), weight 73.029 kg (161 lb), SpO2 99.00%. Abdomen soft NT. No flank pain. Lungs clear to PA. Heart RRR.  Disposition: 01-Home or Self Care  Discharge Orders    Future Appointments: Provider: Department: Dept Phone: Center:   08/23/2011 1:30 PM Iran Ouch, MD [facility] [phone] [facility]   01/10/2012 1:30 PM Corwin Levins, MD [facility] [phone] [facility]     Medication List  As of 08/12/2011  8:16 AM   TAKE these medications         ciprofloxacin 250 MG tablet   Commonly known as: CIPRO   Take 1 tablet (250 mg total) by mouth 2 (two) times daily.      docusate sodium 100 MG capsule   Commonly known as: COLACE   Take 1 capsule (100 mg total) by mouth 2 (two) times daily.      HYDROcodone-acetaminophen 5-325 MG per tablet   Commonly known as: NORCO   Take 1 tablet by mouth every 6 (six) hours as needed for pain.      terazosin 2 MG capsule   Commonly known as: HYTRIN   Take 1 capsule (2 mg total) by mouth at bedtime.         ASK your doctor about these medications         aspirin 325 MG EC tablet   Take 325 mg by mouth daily with breakfast.      co-enzyme Q-10 30 MG capsule   Take 30 mg by mouth daily.      ezetimibe 10 MG  tablet   Commonly known as: ZETIA   Take 10 mg by mouth daily with breakfast.      fish oil-omega-3 fatty acids 1000 MG capsule   Take 1 g by mouth 2 (two) times daily.      multivitamin capsule   Take 1 capsule by mouth daily.      omeprazole 40 MG capsule   Commonly known as: PRILOSEC   Take 40 mg by mouth daily with breakfast.      ramipril 10 MG capsule   Commonly known as: ALTACE   Take 10 mg by mouth daily with breakfast.      rosuvastatin 40 MG tablet   Commonly known as: CRESTOR   Take 40 mg by mouth daily with breakfast.      vitamin C 500 MG tablet   Commonly known as: ASCORBIC ACID   Take 500 mg by mouth 2 (two) times daily.      vitamin E 400 UNIT capsule   Take 400 Units by mouth daily.           Follow-up Information    Follow up with Mena Goes Lowella Petties, MD in 2 weeks.   Contact information:   [address] [facility] [place] [address] [phone]          Signed: Jetta Lout 08/12/2011, 8:16 AM

## 2011-08-14 ENCOUNTER — Encounter (HOSPITAL_COMMUNITY): Payer: Self-pay | Admitting: Urology

## 2011-08-23 ENCOUNTER — Encounter: Payer: Self-pay | Admitting: Cardiovascular Disease

## 2011-08-23 ENCOUNTER — Ambulatory Visit (INDEPENDENT_AMBULATORY_CARE_PROVIDER_SITE_OTHER): Payer: Medicare Other | Admitting: Cardiovascular Disease

## 2011-08-23 VITALS — BP 100/60 | HR 66 | Ht 68.0 in | Wt 158.0 lb

## 2011-08-23 DIAGNOSIS — Z01812 Encounter for preprocedural laboratory examination: Secondary | ICD-10-CM | POA: Diagnosis not present

## 2011-08-23 DIAGNOSIS — I739 Peripheral vascular disease, unspecified: Secondary | ICD-10-CM | POA: Diagnosis not present

## 2011-08-23 DIAGNOSIS — I2510 Atherosclerotic heart disease of native coronary artery without angina pectoris: Secondary | ICD-10-CM

## 2011-08-23 MED ORDER — HYDROCODONE-ACETAMINOPHEN 5-325 MG PO TABS: 1.0000 | ORAL_TABLET | Freq: Four times a day (QID) | ORAL | Status: AC | PRN

## 2011-08-23 NOTE — Assessment & Plan Note (Addendum)
The patient has significant lifestyle limiting claudication in the hip and thigh area bilaterally. Based on physical exam, and his noninvasive imaging , he has evidence of significant aortoiliac disease. Ultrasound duplex imaging showed heavy calcifications in both common iliac arteries with severe distal stenosis bilaterally and severe left proximal external iliac stenosis. Due to severity of his symptoms and noninvasive findings, I recommend proceeding with abdominal aortogram, lower extremity runoff and possible PTA. Risks, benefits and alternatives were discussed with the patient. He does seem to have other neuropathic discomfort which is not expected to improve with the procedure. This was explained to the patient. Continue other medical therapy. Due to his significant discomfort, I gave him a prescription for hydrocodone for 2 weeks. The patient is still having hematuria from recent prostate surgery. He is off aspirin and Plavix. I will schedule the procedure in one month from now to ensure resolution of his hematuria. I also asked him to check with his urologist if it's safe to be on an antiplatelet agent. He has an appointment later this week.

## 2011-08-23 NOTE — Progress Notes (Signed)
  HPI  [name] is a pleasant gentleman who has a history of coronary artery disease, status post PCI of his LAD performed in February 1998. A previous echocardiogram in April of 2012 revealed normal LV function. There was mild LAE. No significant AS. 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 Followup was recommended in one year. FU CT in Feb 2013 showed no aneurysm. Cardiac catheterization in February 2011 and showed an ejection fraction of 60% and no renal artery stenosis. He had a 90% right coronary artery but no obstructive disease in the left system. He had PCI of his right coronary artery with a drug-eluting stent at that time. He is referred by Dr. Crenshaw for evaluation and management of peripheral arterial disease. The patient has known history of a small infrarenal aortic aneurysm which has been monitored and has been stable in size. He had an ultrasound evaluation done recently which showed severe calcifications in the common iliac arteries bilaterally with severe stenosis in the distal common iliac arteries on both sides and severe ostial left external iliac stenosis. The patient complains of significant lower extremity discomfort as well as back and hip pain with minimal walking even going to his mailbox. He does describe another kind of discomfort which is an aching sensation in the sitting position which worsens with certain movements and improves with standing up and leaning forward. He had a recent prostate surgery done recently and he is still recovering from this. He was taken off all antiplatelet agents. He continues to have mild hematuria. He is going to see his urologist later this week.  Allergies  Allergen Reactions  . Sulfa Antibiotics Rash     Current Outpatient Prescriptions on File Prior to Visit  Medication Sig Dispense Refill  . Ascorbic Acid (VITAMIN C) 500  MG tablet Take 500 mg by mouth daily.       . co-enzyme Q-10 30 MG capsule Take 400 mg by mouth daily.       . ezetimibe (ZETIA) 10 MG tablet Take 10 mg by mouth daily with breakfast.      . fish oil-omega-3 fatty acids 1000 MG capsule Take 1 g by mouth daily.       . Multiple Vitamin (MULTIVITAMIN) capsule Take 1 capsule by mouth daily.       . omeprazole (PRILOSEC) 40 MG capsule Take 40 mg by mouth daily with breakfast.      . ramipril (ALTACE) 10 MG capsule Take 10 mg by mouth daily with breakfast.      . rosuvastatin (CRESTOR) 40 MG tablet Take 40 mg by mouth daily with breakfast.      . vitamin E 400 UNIT capsule Take 400 Units by mouth daily.        . ciprofloxacin (CIPRO) 500 MG tablet Take 1 tablet (500 mg total) by mouth every 12 (twelve) hours.  10 tablet  0  . HYDROcodone-acetaminophen (NORCO) 5-325 MG per tablet Take 1 tablet by mouth every 6 (six) hours as needed.  30 tablet  0  . hyoscyamine (LEVSIN SL) 0.125 MG SL tablet Take 1 tablet (0.125 mg total) by mouth every 4 (four) hours as needed.  30 tablet  0     Past Medical History  Diagnosis Date  . Unspecified essential hypertension   . Coronary atherosclerosis of unspecified type of vessel, native or graft   . Aortic   stenosis   . Other and unspecified hyperlipidemia   . Peripheral vascular disease, unspecified   . Atherosclerosis of renal artery   . Pure hypercholesterolemia   . Esophageal reflux   . DJD (degenerative joint disease), lumbar 07/06/2011  . Urine incontinence   . Acute myocardial infarction, unspecified site, episode of care unspecified 10 YRS AGO  . COPD (chronic obstructive pulmonary disease)   . Shortness of breath   . Pneumonia YRS AGO  . Numbness and tingling     FEET AND LEGS  . Frequent urination at night      Past Surgical History  Procedure Date  . Pci of lad 05/1996    STENT X 4 TOTAL  . Hemorrhoid surgery 1954  . Cataract extraction w/ intraocular lens  implant, bilateral YRS AGO  .  Vasectomy 1968  . Transurethral resection of prostate 08/11/2011    Procedure: TRANSURETHRAL RESECTION OF THE PROSTATE WITH GYRUS INSTRUMENTS;  Surgeon: Matthew Ramsey Eskridge, MD;  Location: [facility];  Service: Urology;  Laterality: N/A;        Family History  Problem Relation Age of Onset  . Heart disease Mother   . Heart disease Father      History   Social History  . Marital Status: Widowed    Spouse Name: N/A    Number of Children: N/A  . Years of Education: GED   Occupational History  . retired    Social History Main Topics  . Smoking status: Former Smoker -- 3.0 packs/day for 38 years    Quit date: 04/10/1988  . Smokeless tobacco: Never Used  . Alcohol Use: No  . Drug Use: No  . Sexually Active: Not on file   Other Topics Concern  . Not on file   Social History Narrative  . No narrative on file     ROS Constitutional: Negative for fever, chills, diaphoresis, activity change, appetite change and fatigue.  HENT: Negative for hearing loss, nosebleeds, congestion, sore throat, facial swelling, drooling, trouble swallowing, neck pain, voice change, sinus pressure and tinnitus.  Eyes: Negative for photophobia, pain, discharge and visual disturbance.  Respiratory: Negative for apnea, cough, chest tightness, shortness of breath and wheezing.  Cardiovascular: Negative for chest pain, palpitations and leg swelling.  Gastrointestinal: Negative for nausea, vomiting, abdominal pain, diarrhea, constipation, blood in stool and abdominal distention.  Genitourinary: Negative for dysuria, urgency, frequency, and decreased urine volume. Positive for hematuria. Skin: Negative for color change, pallor, rash and wound.  Neurological: Negative for dizziness, tremors, seizures, syncope, speech difficulty, weakness, light-headedness, numbness and headaches.  Psychiatric/Behavioral: Negative for suicidal ideas, hallucinations, behavioral problems and agitation. The patient is not  nervous/anxious.     PHYSICAL EXAM   BP 100/60  Pulse 66  Ht 5' 8" (1.727 m)  Wt 158 lb (71.668 kg)  BMI 24.02 kg/m2  Constitutional: He is oriented to person, place, and time. He appears well-developed and well-nourished. No distress.  HENT: No nasal discharge.  Head: Normocephalic and atraumatic.  Eyes: Pupils are equal and round. Right eye exhibits no discharge. Left eye exhibits no discharge.  Neck: Normal range of motion. Neck supple. No JVD present. No thyromegaly present.  Cardiovascular: Normal rate, regular rhythm, normal heart sounds and. Exam reveals no gallop and no friction rub. No murmur heard.  Pulmonary/Chest: Effort normal and breath sounds normal. No stridor. No respiratory distress. He has no wheezes. He has no rales. He exhibits no tenderness.  Abdominal: Soft. Bowel sounds are normal. He exhibits   no distension. There is no tenderness. There is no rebound and no guarding.  Musculoskeletal: Normal range of motion. He exhibits no edema and no tenderness.  Neurological: He is alert and oriented to person, place, and time. Coordination normal.  Skin: Skin is warm and dry. No rash noted. He is not diaphoretic. No erythema. No pallor.  Psychiatric: He has a normal mood and affect. His behavior is normal. Judgment and thought content normal.  Vascular: Femoral pulses are +1 on both sides.PT: Normal on both sides.      ASSESSMENT AND PLAN   

## 2011-08-23 NOTE — Patient Instructions (Signed)
Discuss with Urologist use of Plavix and Aspirin  For further hydrocodone refills, contact original ordering doctor

## 2011-08-25 ENCOUNTER — Telehealth: Payer: Self-pay | Admitting: *Deleted

## 2011-08-25 MED ORDER — CLOPIDOGREL BISULFATE 75 MG PO TABS: 75.0000 mg | ORAL_TABLET | Freq: Every day | ORAL | Status: DC

## 2011-08-25 NOTE — Telephone Encounter (Signed)
Pt comes in today regarding restarting Plavix. Plavix was stopped prior to prostate surgery. Pt saw Dr. Kirke Corin on 08/23/11.  According to note restart plavix when okay with Urologist.  Pt has seen urologist and cleared to restart Plavix. Prescription called in for 30 day supply of Plavix. Pt also needs 90 day sent in to [place]. He says Carletta Feasel Dr. Ludwig Clarks nurse knows where to send this. I will forward to [facility] for the 90 day refill.

## 2011-09-08 ENCOUNTER — Encounter (HOSPITAL_COMMUNITY): Payer: Self-pay | Admitting: Pharmacy Technician

## 2011-09-15 ENCOUNTER — Other Ambulatory Visit (INDEPENDENT_AMBULATORY_CARE_PROVIDER_SITE_OTHER): Payer: Medicare Other

## 2011-09-15 DIAGNOSIS — I739 Peripheral vascular disease, unspecified: Secondary | ICD-10-CM | POA: Diagnosis not present

## 2011-09-15 DIAGNOSIS — I2510 Atherosclerotic heart disease of native coronary artery without angina pectoris: Secondary | ICD-10-CM

## 2011-09-15 DIAGNOSIS — Z01812 Encounter for preprocedural laboratory examination: Secondary | ICD-10-CM

## 2011-09-15 LAB — BASIC METABOLIC PANEL
BUN: 26 mg/dL — ABNORMAL HIGH (ref 6–23)
CO2: 26 mEq/L (ref 19–32)
Calcium: 9.1 mg/dL (ref 8.4–10.5)
Chloride: 111 mEq/L (ref 96–112)
Creatinine, Ser: 1.2 mg/dL (ref 0.4–1.5)
GFR: 63.92 mL/min (ref >60.00)
Glucose, Bld: 114 mg/dL — ABNORMAL HIGH (ref 70–99)
Potassium: 5.3 mEq/L — ABNORMAL HIGH (ref 3.5–5.1)
Sodium: 141 mEq/L (ref 135–145)

## 2011-09-15 LAB — CBC WITH DIFFERENTIAL/PLATELET
Basophils Absolute: 0.1 10*3/uL (ref 0.0–0.1)
Basophils Relative: 0.7 % (ref 0.0–3.0)
Eosinophils Absolute: 0.4 10*3/uL (ref 0.0–0.7)
Eosinophils Relative: 4.6 % (ref 0.0–5.0)
HCT: 38.1 % — ABNORMAL LOW (ref 39.0–52.0)
Hemoglobin: 12.5 g/dL — ABNORMAL LOW (ref 13.0–17.0)
Lymphocytes Relative: 23.7 % (ref 12.0–46.0)
Lymphs Abs: 2 10*3/uL (ref 0.7–4.0)
MCHC: 32.8 g/dL (ref 30.0–36.0)
MCV: 91.3 fl (ref 78.0–100.0)
Monocytes Absolute: 0.6 10*3/uL (ref 0.1–1.0)
Monocytes Relative: 7.2 % (ref 3.0–12.0)
Neutro Abs: 5.3 10*3/uL (ref 1.4–7.7)
Neutrophils Relative %: 63.8 % (ref 43.0–77.0)
Platelets: 185 10*3/uL (ref 150.0–400.0)
RBC: 4.18 Mil/uL — ABNORMAL LOW (ref 4.22–5.81)
RDW: 15.3 % — ABNORMAL HIGH (ref 11.5–14.6)
WBC: 8.3 10*3/uL (ref 4.5–10.5)

## 2011-09-15 LAB — PROTIME-INR
INR: 1 ratio (ref 0.8–1.0)
Prothrombin Time: 11 s (ref 10.2–12.4)

## 2011-09-18 ENCOUNTER — Telehealth: Payer: Self-pay | Admitting: Cardiology

## 2011-09-18 ENCOUNTER — Other Ambulatory Visit: Payer: Self-pay | Admitting: Cardiovascular Disease

## 2011-09-18 DIAGNOSIS — I739 Peripheral vascular disease, unspecified: Secondary | ICD-10-CM

## 2011-09-18 NOTE — Telephone Encounter (Signed)
F/u   Patient calling back for status update for issue, he can be reached at [phone]

## 2011-09-18 NOTE — Telephone Encounter (Signed)
New Problem: ° ° ° °Patient called in wanting to speak with you about his medications.  Please call back. °

## 2011-09-19 MED ORDER — RAMIPRIL 10 MG PO CAPS: 10.0000 mg | ORAL_CAPSULE | Freq: Every day | ORAL | Status: DC

## 2011-09-19 MED ORDER — CLOPIDOGREL BISULFATE 75 MG PO TABS: 75.0000 mg | ORAL_TABLET | Freq: Every day | ORAL | Status: DC

## 2011-09-19 MED ORDER — EZETIMIBE 10 MG PO TABS: 10.0000 mg | ORAL_TABLET | Freq: Every day | ORAL | Status: DC

## 2011-09-19 MED ORDER — ROSUVASTATIN CALCIUM 40 MG PO TABS: 40.0000 mg | ORAL_TABLET | Freq: Every day | ORAL | Status: DC

## 2011-09-19 NOTE — Telephone Encounter (Signed)
Spoke with pt, scripts faxed to [facility] @ [phone]

## 2011-09-19 NOTE — Telephone Encounter (Signed)
Follow-up:    Patient returned your call.  Please call back. 

## 2011-09-20 ENCOUNTER — Encounter (HOSPITAL_COMMUNITY)
Admission: RE | Disposition: A | Discharge status: Home / Self Care | Payer: Self-pay | Source: Ambulatory Visit | Attending: Cardiovascular Disease

## 2011-09-20 ENCOUNTER — Ambulatory Visit (HOSPITAL_COMMUNITY)
Admission: RE | Admit: 2011-09-20 | Discharge: 2011-09-20 | Disposition: A | Discharge status: Home / Self Care | Payer: Medicare Other | Source: Ambulatory Visit | Attending: Cardiovascular Disease | Admitting: Cardiovascular Disease

## 2011-09-20 DIAGNOSIS — J4489 Other specified chronic obstructive pulmonary disease: Secondary | ICD-10-CM | POA: Insufficient documentation

## 2011-09-20 DIAGNOSIS — M51379 Other intervertebral disc degeneration, lumbosacral region without mention of lumbar back pain or lower extremity pain: Secondary | ICD-10-CM | POA: Insufficient documentation

## 2011-09-20 DIAGNOSIS — I359 Nonrheumatic aortic valve disorder, unspecified: Secondary | ICD-10-CM | POA: Diagnosis not present

## 2011-09-20 DIAGNOSIS — Z9861 Coronary angioplasty status: Secondary | ICD-10-CM | POA: Diagnosis not present

## 2011-09-20 DIAGNOSIS — J449 Chronic obstructive pulmonary disease, unspecified: Secondary | ICD-10-CM | POA: Insufficient documentation

## 2011-09-20 DIAGNOSIS — I2510 Atherosclerotic heart disease of native coronary artery without angina pectoris: Secondary | ICD-10-CM | POA: Insufficient documentation

## 2011-09-20 DIAGNOSIS — I714 Abdominal aortic aneurysm, without rupture, unspecified: Secondary | ICD-10-CM | POA: Diagnosis not present

## 2011-09-20 DIAGNOSIS — M5137 Other intervertebral disc degeneration, lumbosacral region: Secondary | ICD-10-CM | POA: Diagnosis not present

## 2011-09-20 DIAGNOSIS — I739 Peripheral vascular disease, unspecified: Secondary | ICD-10-CM

## 2011-09-20 DIAGNOSIS — I252 Old myocardial infarction: Secondary | ICD-10-CM | POA: Insufficient documentation

## 2011-09-20 DIAGNOSIS — I708 Atherosclerosis of other arteries: Secondary | ICD-10-CM | POA: Diagnosis not present

## 2011-09-20 DIAGNOSIS — E785 Hyperlipidemia, unspecified: Secondary | ICD-10-CM | POA: Diagnosis not present

## 2011-09-20 DIAGNOSIS — I10 Essential (primary) hypertension: Secondary | ICD-10-CM | POA: Insufficient documentation

## 2011-09-20 DIAGNOSIS — I70219 Atherosclerosis of native arteries of extremities with intermittent claudication, unspecified extremity: Secondary | ICD-10-CM | POA: Diagnosis not present

## 2011-09-20 HISTORY — PX: LOWER EXTREMITY ANGIOGRAM: SHX5508

## 2011-09-20 LAB — POCT ACTIVATED CLOTTING TIME
Activated Clotting Time: 199 seconds
Activated Clotting Time: 215 seconds

## 2011-09-20 SURGERY — Surgical Case
Anesthesia: *Unknown

## 2011-09-20 SURGERY — ANGIOGRAM, LOWER EXTREMITY
Anesthesia: LOCAL | Laterality: Bilateral

## 2011-09-20 MED ORDER — FENTANYL CITRATE 0.05 MG/ML IJ SOLN
INTRAMUSCULAR | Status: AC
  Filled 2011-09-20: qty 2

## 2011-09-20 MED ORDER — MIDAZOLAM HCL 2 MG/2ML IJ SOLN
INTRAMUSCULAR | Status: AC
  Filled 2011-09-20: qty 2

## 2011-09-20 MED ORDER — LIDOCAINE HCL (PF) 1 % IJ SOLN
INTRAMUSCULAR | Status: AC
  Filled 2011-09-20: qty 30

## 2011-09-20 MED ORDER — HEPARIN (PORCINE) IN NACL 2-0.9 UNIT/ML-% IJ SOLN
INTRAMUSCULAR | Status: AC
  Filled 2011-09-20: qty 1000

## 2011-09-20 MED ORDER — NITROGLYCERIN 0.2 MG/ML ON CALL CATH LAB
INTRAVENOUS | Status: AC
  Filled 2011-09-20: qty 1

## 2011-09-20 MED ORDER — HEPARIN SODIUM (PORCINE) 1000 UNIT/ML IJ SOLN
INTRAMUSCULAR | Status: AC
  Filled 2011-09-20: qty 1

## 2011-09-20 MED ORDER — SODIUM CHLORIDE 0.9 % IV SOLN
INTRAVENOUS | Status: DC
  Administered 2011-09-20: 08:00:00 via INTRAVENOUS

## 2011-09-20 NOTE — CV Procedure (Addendum)
PERIPHERAL VASCULAR PROCEDURE  NAME:  Kristopher [name]   MRN: [id number] DOB:  Jun 19, 1930   ADMIT DATE: 09/20/2011  Performing Cardiologist: Lorine Bears Primary Physician: Oliver Barre, MD   Procedures Performed:  Abdominal Aortic Angiogram with Bi-Iliofemoral Runoff  Bilateral Lower Extremity Angiography with Runoff  Left common iliac artery angioplasty.  Left common iliac artery stent placement.   Indication(s):   Claudication   Consent: The procedure with Risks/Benefits/Alternatives and Indications was reviewed with the patient and family).  All questions were answered.  Medications:  Sedation:  1 mg IV Versed, 25 mcg IV Fentanyl  Contrast:  175 mL Omnipaque  Procedural details: The right groin was prepped, draped, and anesthetized with 1% lidocaine. Using modified Seldinger technique, a 5 French sheath was introduced into the right common femoral artery. A 5 Fr Short Pigtail Catheter was advanced of over a  Versicore wire into the descending Aorta to a level just above the renal arteries. A power injection of 57ml/sec contrast over 1 sec was performed for Abdominal Aortic Angiography.  The catheter was then pulled back to a level just above the Aortic bifurcation, and a second power injection was performed to evaluate bi-ileiofemoral arteries with runnoff.  Interventional Procedure:  The pigtail catheter was a change over the versicolor wire for A crossover catheter which was then pulled back the aortic bifurcation and the wire was advanced down the contralateral left common iliac artery. The lesion was crossed with a Glidewire. A straight tip catheter was then advanced to the left external iliac artery. The versicolor wire was used but did not provide enough support to exchange the sheath into a 7 [place] crossover sheath. Thus, a straight tip catheter was advanced again and a Rosen wire was advanced. A 7 French sheath was then advanced and placed in the left common iliac  artery.  5000 units of unfractionated heparin was given and during the procedure an additional 2000 mg were also given. ACT was therapeutic. The lesion in the left common iliac artery was predilated with a power flex balloon 6 x 40 mm to 8 atmospheres. Angiography showed a significant dissection at the lesion site as well as residual stenosis. I thus, decided to place a stent. Self-expanding Smart stent 8 x 40 was placed. This was post dilated with a 7 mm balloon to 8 atmospheres distally. In the midsegment, I had to go up to 14 atmospheres in order to resolve the residual stenosis. Proximally I went up to 8 atmospheres. Angiography showed excellent results with residual dissection just proximal to the stent which is partially covered by the stent and is definitely not flow-limiting.  The sheath was then carefully pulled back to the right external iliac artery. I decided to do a gradient check on the stenosis in the right common iliac artery. 200 mcg of nitroglycerin was given. A pullback with a straight tip catheter was performed. The peak to peak gradient was only 7 mm of mercury. Thus, the lesion was not treated. The sheath was removed and a Perclose device was used to close the side. The patient tolerated the procedure well with no immediate complications.   Hemodynamics:  Central Aortic Pressure / Mean Aortic Pressure: 154/59  Findings:  Abdominal aorta: There is mild atherosclerosis and small infrarenal localized aortic aneurysm.  Left renal artery: Patent without significant stenosis.  Right renal artery: Patent with mild atherosclerosis.  Celiac artery: Patent  Superior mesenteric artery: Patent  Right common iliac artery: 60% lesion close  to the ostium. The peak to peak gradient was 7 mm mercury even after given nitroglycerin.  Right internal iliac artery: Mild atherosclerosis.  Right external iliac artery: Mild atherosclerosis without obstructive disease.  Right common femoral  artery: Minor irregularities.  Right profunda femoral artery: Normal  Right superficial femoral artery: Minor irregularities without obstructive disease.  Right popliteal artery: Minor irregularities without obstructive disease.  Right tibial peroneal trunk: Normal  Right anterior tibial artery: Normal  Right peroneal artery: Normal  Right posterior tibial artery: Normal  Left common iliac artery:  Severely calcified in the proximal segment. In the midsegment, there is an 80-90 percent eccentric stenosis.  Left internal iliac artery: Appears to be occluded and fills via collaterals.  Left external iliac artery: Mild atherosclerosis.  Left common femoral artery: Minor irregularities.  Left profunda femoral artery: Normal  Left superficial femoral artery:  Minor irregularities.  Left popliteal artery: Minor irregularities  Left tibial peroneal trunk: Minor irregularities  Left anterior tibial artery: Patent  Left peroneal artery:  patent  Left posterior tibial artery: Patent  Conclusions: 1. Small infrarenal aortic aneurysm. No significant renal artery stenosis. 2. Significant left common iliac artery stenosis (80-90%) extending into the left external iliac artery. 3. Moderate right common iliac artery stenosis with no significant gradient by pullback. 4. No significant infrainguinal disease. 5. Successful angioplasty and self-expanding stent placement to the left common iliac artery.  Recommendations:  Recommend aggressive medical therapy. Continue dual antiplatelet therapy.   Lorine Bears, MD, [facility] 09/20/2011 11:57 AM

## 2011-09-20 NOTE — Discharge Instructions (Signed)

## 2011-09-20 NOTE — H&P (View-Only) (Signed)
HPI  Kristopher [name] is a pleasant gentleman who has a history of coronary artery disease, status post PCI of his LAD performed in February 1998. A previous echocardiogram in April of 2012 revealed normal LV function. There was mild LAE. No significant AS. 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 Followup was recommended in one year. FU CT in Feb 2013 showed no aneurysm. Cardiac catheterization in February 2011 and showed an ejection fraction of 60% and no renal artery stenosis. He had a 90% right coronary artery but no obstructive disease in the left system. He had PCI of his right coronary artery with a drug-eluting stent at that time. He is referred by Dr. Jens Som for evaluation and management of peripheral arterial disease. The patient has known history of a small infrarenal aortic aneurysm which has been monitored and has been stable in size. He had an ultrasound evaluation done recently which showed severe calcifications in the common iliac arteries bilaterally with severe stenosis in the distal common iliac arteries on both sides and severe ostial left external iliac stenosis. The patient complains of significant lower extremity discomfort as well as back and hip pain with minimal walking even going to his mailbox. He does describe another kind of discomfort which is an aching sensation in the sitting position which worsens with certain movements and improves with standing up and leaning forward. He had a recent prostate surgery done recently and he is still recovering from this. He was taken off all antiplatelet agents. He continues to have mild hematuria. He is going to see his urologist later this week.  Allergies  Allergen Reactions  . Sulfa Antibiotics Rash     Current Outpatient Prescriptions on File Prior to Visit  Medication Sig Dispense Refill  . Ascorbic Acid (VITAMIN C) 500  MG tablet Take 500 mg by mouth daily.       Marland Kitchen co-enzyme Q-10 30 MG capsule Take 400 mg by mouth daily.       Marland Kitchen ezetimibe (ZETIA) 10 MG tablet Take 10 mg by mouth daily with breakfast.      . fish oil-omega-3 fatty acids 1000 MG capsule Take 1 g by mouth daily.       . Multiple Vitamin (MULTIVITAMIN) capsule Take 1 capsule by mouth daily.       Marland Kitchen omeprazole (PRILOSEC) 40 MG capsule Take 40 mg by mouth daily with breakfast.      . ramipril (ALTACE) 10 MG capsule Take 10 mg by mouth daily with breakfast.      . rosuvastatin (CRESTOR) 40 MG tablet Take 40 mg by mouth daily with breakfast.      . vitamin E 400 UNIT capsule Take 400 Units by mouth daily.        . ciprofloxacin (CIPRO) 500 MG tablet Take 1 tablet (500 mg total) by mouth every 12 (twelve) hours.  10 tablet  0  . HYDROcodone-acetaminophen (NORCO) 5-325 MG per tablet Take 1 tablet by mouth every 6 (six) hours as needed.  30 tablet  0  . hyoscyamine (LEVSIN SL) 0.125 MG SL tablet Take 1 tablet (0.125 mg total) by mouth every 4 (four) hours as needed.  30 tablet  0     Past Medical History  Diagnosis Date  . Unspecified essential hypertension   . Coronary atherosclerosis of unspecified type of vessel, native or graft   . Aortic  stenosis   . Other and unspecified hyperlipidemia   . Peripheral vascular disease, unspecified   . Atherosclerosis of renal artery   . Pure hypercholesterolemia   . Esophageal reflux   . DJD (degenerative joint disease), lumbar 07/06/2011  . Urine incontinence   . Acute myocardial infarction, unspecified site, episode of care unspecified 10 YRS AGO  . COPD (chronic obstructive pulmonary disease)   . Shortness of breath   . Pneumonia YRS AGO  . Numbness and tingling     FEET AND LEGS  . Frequent urination at night      Past Surgical History  Procedure Date  . Pci of lad 05/1996    STENT X 4 TOTAL  . Hemorrhoid surgery 1954  . Cataract extraction w/ intraocular lens  implant, bilateral YRS AGO  .  Vasectomy 1968  . Transurethral resection of prostate 08/11/2011    Procedure: TRANSURETHRAL RESECTION OF THE PROSTATE WITH GYRUS INSTRUMENTS;  Surgeon: Antony Haste, MD;  Location: [facility];  Service: Urology;  Laterality: N/A;        Family History  Problem Relation Age of Onset  . Heart disease Mother   . Heart disease Father      History   Social History  . Marital Status: Widowed    Spouse Name: N/A    Number of Children: N/A  . Years of Education: GED   Occupational History  . retired    Social History Main Topics  . Smoking status: Former Smoker -- 3.0 packs/day for 38 years    Quit date: 04/10/1988  . Smokeless tobacco: Never Used  . Alcohol Use: No  . Drug Use: No  . Sexually Active: Not on file   Other Topics Concern  . Not on file   Social History Narrative  . No narrative on file     ROS Constitutional: Negative for fever, chills, diaphoresis, activity change, appetite change and fatigue.  HENT: Negative for hearing loss, nosebleeds, congestion, sore throat, facial swelling, drooling, trouble swallowing, neck pain, voice change, sinus pressure and tinnitus.  Eyes: Negative for photophobia, pain, discharge and visual disturbance.  Respiratory: Negative for apnea, cough, chest tightness, shortness of breath and wheezing.  Cardiovascular: Negative for chest pain, palpitations and leg swelling.  Gastrointestinal: Negative for nausea, vomiting, abdominal pain, diarrhea, constipation, blood in stool and abdominal distention.  Genitourinary: Negative for dysuria, urgency, frequency, and decreased urine volume. Positive for hematuria. Skin: Negative for color change, pallor, rash and wound.  Neurological: Negative for dizziness, tremors, seizures, syncope, speech difficulty, weakness, light-headedness, numbness and headaches.  Psychiatric/Behavioral: Negative for suicidal ideas, hallucinations, behavioral problems and agitation. The patient is not  nervous/anxious.     PHYSICAL EXAM   BP 100/60  Pulse 66  Ht 5\' 8"  (1.727 m)  Wt 158 lb (71.668 kg)  BMI 24.02 kg/m2  Constitutional: He is oriented to person, place, and time. He appears well-developed and well-nourished. No distress.  HENT: No nasal discharge.  Head: Normocephalic and atraumatic.  Eyes: Pupils are equal and round. Right eye exhibits no discharge. Left eye exhibits no discharge.  Neck: Normal range of motion. Neck supple. No JVD present. No thyromegaly present.  Cardiovascular: Normal rate, regular rhythm, normal heart sounds and. Exam reveals no gallop and no friction rub. No murmur heard.  Pulmonary/Chest: Effort normal and breath sounds normal. No stridor. No respiratory distress. He has no wheezes. He has no rales. He exhibits no tenderness.  Abdominal: Soft. Bowel sounds are normal. He exhibits  no distension. There is no tenderness. There is no rebound and no guarding.  Musculoskeletal: Normal range of motion. He exhibits no edema and no tenderness.  Neurological: He is alert and oriented to person, place, and time. Coordination normal.  Skin: Skin is warm and dry. No rash noted. He is not diaphoretic. No erythema. No pallor.  Psychiatric: He has a normal mood and affect. His behavior is normal. Judgment and thought content normal.  Vascular: Femoral pulses are +1 on both sides.PT: Normal on both sides.      ASSESSMENT AND PLAN

## 2011-09-20 NOTE — Interval H&P Note (Signed)
History and Physical Interval Note:  09/20/2011 10:20 AM  Kristopher [name]  has presented today for surgery, with the diagnosis of pad  The various methods of treatment have been discussed with the patient and family. After consideration of risks, benefits and other options for treatment, the patient has consented to  Procedure(s) (LRB): LOWER EXTREMITY ANGIOGRAM (Bilateral) as a surgical intervention .  The patients' history has been reviewed, patient examined, no change in status, stable for surgery.  I have reviewed the patients' chart and labs.  Questions were answered to the patient's satisfaction.     Lorine Bears

## 2011-10-02 ENCOUNTER — Other Ambulatory Visit: Payer: Self-pay | Admitting: *Deleted

## 2011-10-02 MED ORDER — EZETIMIBE 10 MG PO TABS: 10.0000 mg | ORAL_TABLET | Freq: Every day | ORAL | Status: DC

## 2011-10-02 MED ORDER — ROSUVASTATIN CALCIUM 40 MG PO TABS: 40.0000 mg | ORAL_TABLET | Freq: Every day | ORAL | Status: DC

## 2011-10-02 MED ORDER — RAMIPRIL 10 MG PO CAPS: 10.0000 mg | ORAL_CAPSULE | Freq: Every day | ORAL | Status: DC

## 2011-10-04 ENCOUNTER — Encounter: Payer: Self-pay | Admitting: Physician Assistant

## 2011-10-04 ENCOUNTER — Ambulatory Visit (INDEPENDENT_AMBULATORY_CARE_PROVIDER_SITE_OTHER): Payer: Medicare Other | Admitting: Physician Assistant

## 2011-10-04 VITALS — BP 100/56 | HR 60 | Ht 68.5 in | Wt 154.0 lb

## 2011-10-04 DIAGNOSIS — I2510 Atherosclerotic heart disease of native coronary artery without angina pectoris: Secondary | ICD-10-CM

## 2011-10-04 DIAGNOSIS — I10 Essential (primary) hypertension: Secondary | ICD-10-CM | POA: Diagnosis not present

## 2011-10-04 DIAGNOSIS — I739 Peripheral vascular disease, unspecified: Secondary | ICD-10-CM

## 2011-10-04 DIAGNOSIS — R1031 Right lower quadrant pain: Secondary | ICD-10-CM

## 2011-10-04 NOTE — Patient Instructions (Addendum)
Your physician has requested that you have a lower extremity arterial duplex WITH ABI'S DX F/U ON POST ANGIO WITH STENT; . This test is an ultrasound of the arteries in the legs or arms. It looks at arterial blood flow in the legs and arms. Allow one hour for Lower and Upper Arterial scans. There are no restrictions or special instructions   Your physician has requested that you have a lower extremity arterial duplex FOR THE RIGHT GROIN DX RIGHT GROIN PAIN AND BRUIT AND TO R/O PSEUDOANEURYSM PER SCOTT WEAVER, PAC THIS NEEDS TO BE DONE WITHIN THE NEXT WEEK . This test is an ultrasound of the arteries in the legs or arms. It looks at arterial blood flow in the legs and arms. Allow one hour for Lower and Upper Arterial scans. There are no restrictions or special instructions   Your physician wants you to follow-up in: 6 MONTHS WITH DR. Kirke Corin. You will receive a reminder letter in the mail two months in advance. If you don't receive a letter, please call our office to schedule the follow-up appointment.   KEEP UPCOMING APPOINTMENT WITH DR. CRENSHAW AS ALREADY SCHEDULED 01/2012

## 2011-10-04 NOTE — Progress Notes (Signed)
[address] [place], [place]  [address] Phone: [phone] Fax:  [phone]  Date:  10/04/2011   Name:  Kristopher [name]   DOB:  11-Apr-1930   MRN:  [id number]  PCP:  Oliver Barre, MD  Primary Cardiologist:  Dr. Olga Millers  PV Physician:  Dr. Lorine Bears    History of Present Illness: Kristopher [name] is a 78 y.o. male who returns for follow up.  He has a h/o CAD, s/p PCI of his LAD performed in February 1998.  He also has PVD and has had prior bilateral renal artery stenting.  Echocardiogram in April of 2012 revealed normal LV function, mild LAE, no significant AS.  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  Followup was recommended in one year.  FU CT in Feb 2013 showed no aneurysm.  Cardiac catheterization in February 2011:  EF 60% and no renal artery stenosis, RCA 90% but no obstructive disease in the left system.  He had PCI of his right coronary artery with a drug-eluting stent at that time.  Seen by Dr. Olga Millers 07/2011.  He complained of chest pain.  Myoview 07/31/11: low risk, no ischemia, EF 56%.  He also noted intermittent claudication.  He saw Dr. Lorine Bears 5/15.  Patient had recent hematuria from prostate surgery.  Procedure was planned one month later.  LE angiogram 09/20/11:  Small infrarenal aortic aneurysm, ostial right CIA 60%, proximal left CIA severely calcified with 80-90%, left EIA occluded and filled by collats.  He underwent intervention with angioplasty and stenting to the left CIA.  Continued dual antiplatelet Rx was recommended.  He is doing well.  He denies any significant lower extremity pain with exertion.  He denies any nonhealing ulcers.  He has occasional chest pains.  This is fairly stable without change.  He denies significant dyspnea.  He denies syncope.  Wt Readings from Last 3 Encounters:  10/04/11 154 lb (69.854  kg)  09/20/11 155 lb (70.308 kg)  09/20/11 155 lb (70.308 kg)     Potassium  Date/Time Value Range Status  09/15/2011  9:41 AM 5.3* 3.5 - 5.1 mEq/L Final     Creatinine, Ser  Date/Time Value Range Status  09/15/2011  9:41 AM 1.2  0.4 - 1.5 mg/dL Final     ALT  Date/Time Value Range Status  07/31/2011  8:43 AM 25  0 - 53 U/L Final     Hemoglobin  Date/Time Value Range Status  09/15/2011  9:41 AM 12.5* 13.0 - 17.0 g/dL Final    Past Medical History  Diagnosis Date  . HTN (hypertension)   . CAD (coronary artery disease)     s/p PCI of LAD 1998;  s/p DES to RCA 05/2009;  Myoview 4/13: low risk, no ischemia, EF 56%  . Aortic stenosis     echo 4/12: normal LVF, mild LAE, no significant AS  . HLD (hyperlipidemia)   . PAD (peripheral artery disease)     s/p L CIA stent 09/2011 ([facility])  . Renal artery stenosis     s/p bilat RA stents  . Esophageal reflux   . DJD (degenerative joint disease), lumbar 07/06/2011  . Urine incontinence   . Acute myocardial infarction, unspecified site, episode of care unspecified 1998  . COPD (chronic obstructive pulmonary disease)   . AAA (abdominal aortic aneurysm)  ultrasound 07/2011:  2.7 x 2.8 cm    Current Outpatient Prescriptions  Medication Sig Dispense Refill  . Ascorbic Acid (VITAMIN C) 500 MG tablet Take 500 mg by mouth daily.       Marland Kitchen aspirin 325 MG EC tablet Take 325 mg by mouth daily.      . clopidogrel (PLAVIX) 75 MG tablet Take 1 tablet (75 mg total) by mouth daily.  90 tablet  4  . Co-Enzyme Q-10 100 MG CAPS Take 1 capsule by mouth daily.      Marland Kitchen ezetimibe (ZETIA) 10 MG tablet Take 1 tablet (10 mg total) by mouth daily with breakfast.  90 tablet  4  . fish oil-omega-3 fatty acids 1000 MG capsule Take 1 g by mouth daily.       . hyoscyamine (LEVSIN SL) 0.125 MG SL tablet Take 1 tablet (0.125 mg total) by mouth every 4 (four) hours as needed.  30 tablet  0  . Multiple Vitamin (MULTIVITAMIN) capsule Take 1 capsule by mouth daily.       .  Omega-3 Fatty Acids (FISH OIL) 600 MG CAPS Take 1 capsule by mouth daily.      Marland Kitchen omeprazole (PRILOSEC) 40 MG capsule Take 40 mg by mouth daily with breakfast.      . ramipril (ALTACE) 10 MG capsule Take 1 capsule (10 mg total) by mouth daily with breakfast.  90 capsule  4  . rosuvastatin (CRESTOR) 40 MG tablet Take 1 tablet (40 mg total) by mouth daily with breakfast.  90 tablet  4  . vitamin E 400 UNIT capsule Take 400 Units by mouth daily.          Allergies: Allergies  Allergen Reactions  . Sulfa Antibiotics Rash    History  Substance Use Topics  . Smoking status: Former Smoker -- 3.0 packs/day for 38 years    Quit date: 04/10/1988  . Smokeless tobacco: Never Used  . Alcohol Use: No     ROS:  Please see the history of present illness.   Denies hematuria.  All other systems reviewed and negative.   PHYSICAL EXAM: VS:  BP 100/56  Pulse 60  Ht 5' 8.5" (1.74 m)  Wt 154 lb (69.854 kg)  BMI 23.08 kg/m2 Well nourished, well developed, in no acute distress HEENT: normal Neck: no JVD Cardiac:  normal S1, S2; RRR; no murmur Lungs:  clear to auscultation bilaterally, no wheezing, rhonchi or rales Abd: soft, nontender, no hepatomegaly Ext: no edema; Right groin with positive bruit over FA, small pulsatile mass that is tender to palpation Vascular: DP/PT 2+ bilaterally Skin: warm and dry Neuro:  CNs 2-12 intact, no focal abnormalities noted  EKG:  Sinus rhythm, rate 60, normal axis, no acute changes   ASSESSMENT AND PLAN:   1.  Peripheral Arterial Disease Doing well post left common iliac artery stenting.  Distal pulses are intact.  Continue dual antiplatelet therapy.  Arrange routine follow up post procedure ABIs.  Follow up with Dr. Lorine Bears 6 mos.   2.  Right Groin Pain His bruit is likely from his known PAD.  However, there is a very small pulsatile mass that is painful with palpation.  Obtain right groin ultrasound to rule out pseudoaneurysm.  3.  Coronary Artery  Disease Stable.  Continue current Rx. Follow up with Dr. Olga Millers as planned.  4.  Hypertension Controlled.  Continue current therapy.   Luna Glasgow, PA-C  11:33 AM 10/04/2011

## 2011-10-09 ENCOUNTER — Telehealth: Payer: Self-pay | Admitting: Cardiology

## 2011-10-09 DIAGNOSIS — I2510 Atherosclerotic heart disease of native coronary artery without angina pectoris: Secondary | ICD-10-CM

## 2011-10-09 MED ORDER — ATORVASTATIN CALCIUM 80 MG PO TABS: 80.0000 mg | ORAL_TABLET | Freq: Every day | ORAL | Status: DC

## 2011-10-09 NOTE — Telephone Encounter (Signed)
Spoke with pt, aware of med change and need for repeat labs 

## 2011-10-09 NOTE — Telephone Encounter (Signed)
Spoke with pt, he needs prior auth to use crestor or he can change to lovastatin, pravastatin, simvastatin or lipitor. He is currently taking crestor 40 mg once daily. Will forward for dr Jens Som review

## 2011-10-09 NOTE — Telephone Encounter (Signed)
DC crestor; lipitor 80 mg po daily; lipids and liver in six weeks Olga Millers

## 2011-10-09 NOTE — Telephone Encounter (Signed)
Please return call to patient at [phone] to discuss medications

## 2011-10-10 ENCOUNTER — Encounter (INDEPENDENT_AMBULATORY_CARE_PROVIDER_SITE_OTHER): Payer: Medicare Other

## 2011-10-10 DIAGNOSIS — I739 Peripheral vascular disease, unspecified: Secondary | ICD-10-CM

## 2011-10-10 DIAGNOSIS — I10 Essential (primary) hypertension: Secondary | ICD-10-CM

## 2011-10-10 DIAGNOSIS — I2510 Atherosclerotic heart disease of native coronary artery without angina pectoris: Secondary | ICD-10-CM

## 2011-10-10 DIAGNOSIS — R1031 Right lower quadrant pain: Secondary | ICD-10-CM

## 2011-10-18 ENCOUNTER — Other Ambulatory Visit: Payer: Self-pay

## 2011-10-18 ENCOUNTER — Telehealth: Payer: Self-pay

## 2011-10-18 DIAGNOSIS — I739 Peripheral vascular disease, unspecified: Secondary | ICD-10-CM

## 2011-10-18 NOTE — Telephone Encounter (Signed)
Message received from Dr Kirke Corin: "Normal ABI. He needs to be scheduled for Aortoiliac Duplex ultrasound and repeat ABI in 6 months before his visit with me."  No answer at either number.  No voicemail.  Aortoiliac duplex was ordered.  Message sent to scheduling dept to schedule.

## 2011-10-19 NOTE — Telephone Encounter (Signed)
NA. No voicemail.  

## 2011-11-21 NOTE — Telephone Encounter (Signed)
Pt was notified.  

## 2011-11-22 ENCOUNTER — Other Ambulatory Visit (INDEPENDENT_AMBULATORY_CARE_PROVIDER_SITE_OTHER): Payer: Medicare Other

## 2011-11-22 ENCOUNTER — Other Ambulatory Visit: Payer: Self-pay

## 2011-11-22 DIAGNOSIS — I2510 Atherosclerotic heart disease of native coronary artery without angina pectoris: Secondary | ICD-10-CM

## 2011-11-22 LAB — HEPATIC FUNCTION PANEL
ALT: 22 U/L (ref 0–53)
AST: 28 U/L (ref 0–37)
Albumin: 3.9 g/dL (ref 3.5–5.2)
Alkaline Phosphatase: 68 U/L (ref 39–117)
Bilirubin, Direct: 0 mg/dL (ref 0.0–0.3)
Total Bilirubin: 0.5 mg/dL (ref 0.3–1.2)
Total Protein: 6.7 g/dL (ref 6.0–8.3)

## 2011-11-22 LAB — LIPID PANEL
Cholesterol: 112 mg/dL (ref 0–200)
HDL: 49.5 mg/dL (ref >39.00)
LDL Cholesterol: 52 mg/dL (ref 0–99)
Total CHOL/HDL Ratio: 2
Triglycerides: 53 mg/dL (ref 0.0–149.0)
VLDL: 10.6 mg/dL (ref 0.0–40.0)

## 2011-11-22 MED ORDER — TRAMADOL HCL 50 MG PO TABS: 50.0000 mg | ORAL_TABLET | Freq: Four times a day (QID) | ORAL | Status: AC | PRN

## 2011-11-22 NOTE — Telephone Encounter (Signed)
Done erx 

## 2011-11-24 ENCOUNTER — Telehealth: Payer: Self-pay | Admitting: *Deleted

## 2011-11-24 NOTE — Telephone Encounter (Signed)
Message copied by Burnell Blanks on Fri Nov 24, 2011 12:38 PM ------      Message from: Lewayne Bunting      Created: Wed Nov 22, 2011  4:51 PM       Leonidas Romberg Leon

## 2011-11-24 NOTE — Telephone Encounter (Signed)
Unable to reach, mailed copy

## 2011-12-04 DIAGNOSIS — R3915 Urgency of urination: Secondary | ICD-10-CM | POA: Diagnosis not present

## 2011-12-04 DIAGNOSIS — R351 Nocturia: Secondary | ICD-10-CM | POA: Diagnosis not present

## 2012-01-10 ENCOUNTER — Ambulatory Visit (INDEPENDENT_AMBULATORY_CARE_PROVIDER_SITE_OTHER): Payer: Medicare Other | Admitting: Internal Medicine

## 2012-01-10 ENCOUNTER — Encounter: Payer: Self-pay | Admitting: Internal Medicine

## 2012-01-10 VITALS — BP 112/70 | HR 72 | Temp 97.0°F | Ht 68.5 in | Wt 151.2 lb

## 2012-01-10 DIAGNOSIS — I10 Essential (primary) hypertension: Secondary | ICD-10-CM

## 2012-01-10 DIAGNOSIS — M47816 Spondylosis without myelopathy or radiculopathy, lumbar region: Secondary | ICD-10-CM

## 2012-01-10 DIAGNOSIS — M5137 Other intervertebral disc degeneration, lumbosacral region: Secondary | ICD-10-CM | POA: Diagnosis not present

## 2012-01-10 DIAGNOSIS — E785 Hyperlipidemia, unspecified: Secondary | ICD-10-CM | POA: Diagnosis not present

## 2012-01-10 MED ORDER — HYDROCODONE-ACETAMINOPHEN 5-500 MG PO TABS: 1.0000 | ORAL_TABLET | Freq: Every evening | ORAL | Status: DC | PRN

## 2012-01-10 MED ORDER — CYCLOBENZAPRINE HCL 10 MG PO TABS: 10.0000 mg | ORAL_TABLET | Freq: Every evening | ORAL | Status: DC | PRN

## 2012-01-10 NOTE — Patient Instructions (Addendum)
Take all new medications as prescribed  - the bedtime pain medication and muscle relaxer Please return for a Nurse Visit if you change your mind about the flu shot, and other shot like the pneumonia shot Continue all other medications as before Please have the pharmacy call with any refills you may need. Please remember to sign up for My Chart at your earliest convenience, as this will be important to you in the future with finding out test results. Please return in 6 months, or sooner if needed

## 2012-01-10 NOTE — Progress Notes (Signed)
Subjective:    Patient ID: Kristopher [name], male    DOB: 05-Jun-1930, 78 y.o.   MRN: [id number]  HPI  Here to f/u; overall doing ok,  Pt denies chest pain, increased sob or doe, wheezing, orthopnea, PND, increased LE swelling, palpitations, dizziness or syncope.  Pt denies new neurological symptoms such as new headache, or facial or extremity weakness or numbness   Pt denies polydipsia, polyuria,   Pt states overall good compliance with meds, trying to follow lower cholesterol diet, wt overall stable but little exercise however.  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 Tramadol not working well, only needs a pain med at night before bed, has not tried flexeril, also with nighttime leg cramps, though only ocasional problem with getting to sleep.   Past Medical History  Diagnosis Date  . HTN (hypertension)   . CAD (coronary artery disease)     s/p PCI of LAD 1998;  s/p DES to RCA 05/2009;  Myoview 4/13: low risk, no ischemia, EF 56%  . Aortic stenosis     echo 4/12: normal LVF, mild LAE, no significant AS  . HLD (hyperlipidemia)   . PAD (peripheral artery disease)     s/p L CIA stent 09/2011 ([facility])  . Renal artery stenosis     s/p bilat RA stents  . Esophageal reflux   . DJD (degenerative joint disease), lumbar 07/06/2011  . Urine incontinence   . Acute myocardial infarction, unspecified site, episode of care unspecified 1998  . COPD (chronic obstructive pulmonary disease)   . AAA (abdominal aortic aneurysm)     ultrasound 07/2011:  2.7 x 2.8 cm   Past Surgical History  Procedure Date  . Pci of lad 05/1996    STENT X 4 TOTAL  . Hemorrhoid surgery 1954  . Cataract extraction w/ intraocular lens  implant, bilateral YRS AGO    . Vasectomy 1968  . Transurethral resection of prostate 08/11/2011    Procedure: TRANSURETHRAL RESECTION OF THE PROSTATE WITH GYRUS INSTRUMENTS;  Surgeon: Antony Haste, MD;  Location: [facility];  Service: Urology;  Laterality: N/A;       reports that he quit smoking about 23 years ago. He has never used smokeless tobacco. He reports that he does not drink alcohol or use illicit drugs. family history includes Heart disease in his father and mother. Allergies  Allergen Reactions  . Sulfa Antibiotics Rash   Current Outpatient Prescriptions on File Prior to Visit  Medication Sig Dispense Refill  . Ascorbic Acid (VITAMIN C) 500 MG tablet Take 500 mg by mouth daily.       Marland Kitchen aspirin 325 MG EC tablet Take 325 mg by mouth daily.      Marland Kitchen atorvastatin (LIPITOR) 80 MG tablet Take 1 tablet (80 mg total) by mouth daily.  90 tablet  3  . clopidogrel (PLAVIX) 75 MG tablet Take 1 tablet (75 mg total) by mouth daily.  90 tablet  4  . Co-Enzyme Q-10 100 MG CAPS Take 1 capsule by mouth daily.      Marland Kitchen ezetimibe (ZETIA) 10 MG tablet Take 1 tablet (10 mg total) by mouth daily with breakfast.  90 tablet  4  . fish oil-omega-3 fatty acids 1000 MG  capsule Take 1 g by mouth daily.       . Multiple Vitamin (MULTIVITAMIN) capsule Take 1 capsule by mouth daily.       . Omega-3 Fatty Acids (FISH OIL) 600 MG CAPS Take 1 capsule by mouth daily.      Marland Kitchen omeprazole (PRILOSEC) 40 MG capsule Take 40 mg by mouth daily with breakfast.      . ramipril (ALTACE) 10 MG capsule Take 1 capsule (10 mg total) by mouth daily with breakfast.  90 capsule  4  . vitamin E 400 UNIT capsule Take 400 Units by mouth daily.        . hyoscyamine (LEVSIN SL) 0.125 MG SL tablet Take 1 tablet (0.125 mg total) by mouth every 4 (four) hours as needed.  30 tablet  0   Review of Systems  Constitutional: Negative for diaphoresis and unexpected weight change.  HENT: Negative for tinnitus.   Eyes: Negative for photophobia and visual disturbance.   Respiratory: Negative for choking and stridor.   Gastrointestinal: Negative for vomiting and blood in stool.  Genitourinary: Negative for hematuria and decreased urine volume.  Musculoskeletal: Negative for gait problem.  Skin: Negative for color change and wound.  Neurological: Negative for tremors and numbness.  Psychiatric/Behavioral: Negative for decreased concentration. The patient is not hyperactive.       Objective:   Physical Exam BP 112/70  Pulse 72  Temp 97 F (36.1 C) (Oral)  Ht 5' 8.5" (1.74 m)  Wt 151 lb 4 oz (68.607 kg)  BMI 22.66 kg/m2  SpO2 97% Physical Exam  VS noted Constitutional: Pt appears well-developed and well-nourished.  HENT: Head: Normocephalic.  Right Ear: External ear normal.  Left Ear: External ear normal.  Eyes: Conjunctivae and EOM are normal. Pupils are equal, round, and reactive to light.  Neck: Normal range of motion. Neck supple.  Cardiovascular: Normal rate and regular rhythm.   Pulmonary/Chest: Effort normal and breath sounds decreased bilat,  Spine nontender, no swelling/ red/rash Neurological: Pt is alert. Not confused  Skin: Skin is warm. No erythema.  Psychiatric: Pt behavior is normal. Thought content normal.     Assessment & Plan:

## 2012-01-13 ENCOUNTER — Encounter: Payer: Self-pay | Admitting: Internal Medicine

## 2012-01-13 NOTE — Assessment & Plan Note (Signed)
stable overall by hx and exam, most recent data reviewed with pt, and pt to continue medical treatment as before BP Readings from Last 3 Encounters:  01/10/12 112/70  10/04/11 100/56  09/20/11 136/72

## 2012-01-13 NOTE — Assessment & Plan Note (Signed)
For pain med/muscle relaxer qhs prn,  to f/u any worsening symptoms or concerns

## 2012-01-13 NOTE — Assessment & Plan Note (Signed)
stable overall by hx and exam, most recent data reviewed with pt, and pt to continue medical treatment as before Lab Results  Component Value Date   LDLCALC 52 11/22/2011

## 2012-05-30 ENCOUNTER — Other Ambulatory Visit: Payer: Self-pay | Admitting: Internal Medicine

## 2012-07-11 ENCOUNTER — Encounter: Payer: Self-pay | Admitting: Internal Medicine

## 2012-07-11 ENCOUNTER — Other Ambulatory Visit (INDEPENDENT_AMBULATORY_CARE_PROVIDER_SITE_OTHER): Payer: Medicare Other

## 2012-07-11 ENCOUNTER — Ambulatory Visit (INDEPENDENT_AMBULATORY_CARE_PROVIDER_SITE_OTHER): Payer: Medicare Other | Admitting: Internal Medicine

## 2012-07-11 VITALS — BP 120/70 | HR 70 | Temp 97.4°F | Ht 68.5 in | Wt 160.0 lb

## 2012-07-11 DIAGNOSIS — M79641 Pain in right hand: Secondary | ICD-10-CM

## 2012-07-11 DIAGNOSIS — G8929 Other chronic pain: Secondary | ICD-10-CM

## 2012-07-11 DIAGNOSIS — E785 Hyperlipidemia, unspecified: Secondary | ICD-10-CM

## 2012-07-11 DIAGNOSIS — I10 Essential (primary) hypertension: Secondary | ICD-10-CM | POA: Diagnosis not present

## 2012-07-11 DIAGNOSIS — M79609 Pain in unspecified limb: Secondary | ICD-10-CM

## 2012-07-11 DIAGNOSIS — K219 Gastro-esophageal reflux disease without esophagitis: Secondary | ICD-10-CM

## 2012-07-11 LAB — CBC WITH DIFFERENTIAL/PLATELET
Basophils Absolute: 0 10*3/uL (ref 0.0–0.1)
Basophils Relative: 0.6 % (ref 0.0–3.0)
Eosinophils Absolute: 0.5 10*3/uL (ref 0.0–0.7)
Eosinophils Relative: 6.7 % — ABNORMAL HIGH (ref 0.0–5.0)
HCT: 41.5 % (ref 39.0–52.0)
Hemoglobin: 14 g/dL (ref 13.0–17.0)
Lymphocytes Relative: 34.4 % (ref 12.0–46.0)
Lymphs Abs: 2.7 10*3/uL (ref 0.7–4.0)
MCHC: 33.7 g/dL (ref 30.0–36.0)
MCV: 90.3 fl (ref 78.0–100.0)
Monocytes Absolute: 0.8 10*3/uL (ref 0.1–1.0)
Monocytes Relative: 10.8 % (ref 3.0–12.0)
Neutro Abs: 3.7 10*3/uL (ref 1.4–7.7)
Neutrophils Relative %: 47.5 % (ref 43.0–77.0)
Platelets: 219 10*3/uL (ref 150.0–400.0)
RBC: 4.6 Mil/uL (ref 4.22–5.81)
RDW: 16.4 % — ABNORMAL HIGH (ref 11.5–14.6)
WBC: 7.8 10*3/uL (ref 4.5–10.5)

## 2012-07-11 LAB — URINALYSIS, ROUTINE W REFLEX MICROSCOPIC
Bilirubin Urine: NEGATIVE
Hgb urine dipstick: NEGATIVE
Ketones, ur: NEGATIVE
Leukocytes, UA: NEGATIVE
Nitrite: NEGATIVE
Specific Gravity, Urine: <=1.005 (ref 1.000–1.030)
Total Protein, Urine: NEGATIVE
Urine Glucose: NEGATIVE
Urobilinogen, UA: 0.2 (ref 0.0–1.0)
pH: 6 (ref 5.0–8.0)

## 2012-07-11 LAB — BASIC METABOLIC PANEL
BUN: 26 mg/dL — ABNORMAL HIGH (ref 6–23)
CO2: 25 mEq/L (ref 19–32)
Calcium: 9 mg/dL (ref 8.4–10.5)
Chloride: 108 mEq/L (ref 96–112)
Creatinine, Ser: 1.2 mg/dL (ref 0.4–1.5)
GFR: 60.21 mL/min (ref >60.00)
Glucose, Bld: 87 mg/dL (ref 70–99)
Potassium: 4.4 mEq/L (ref 3.5–5.1)
Sodium: 139 mEq/L (ref 135–145)

## 2012-07-11 LAB — HEPATIC FUNCTION PANEL
ALT: 20 U/L (ref 0–53)
AST: 28 U/L (ref 0–37)
Albumin: 3.9 g/dL (ref 3.5–5.2)
Alkaline Phosphatase: 77 U/L (ref 39–117)
Bilirubin, Direct: 0.1 mg/dL (ref 0.0–0.3)
Total Bilirubin: 0.7 mg/dL (ref 0.3–1.2)
Total Protein: 6.8 g/dL (ref 6.0–8.3)

## 2012-07-11 LAB — LIPID PANEL
Cholesterol: 115 mg/dL (ref 0–200)
HDL: 42.7 mg/dL (ref >39.00)
LDL Cholesterol: 53 mg/dL (ref 0–99)
Total CHOL/HDL Ratio: 3
Triglycerides: 98 mg/dL (ref 0.0–149.0)
VLDL: 19.6 mg/dL (ref 0.0–40.0)

## 2012-07-11 LAB — TSH: TSH: 3.08 u[IU]/mL (ref 0.35–5.50)

## 2012-07-11 MED ORDER — OMEPRAZOLE 40 MG PO CPDR: DELAYED_RELEASE_CAPSULE | ORAL | Status: DC

## 2012-07-11 MED ORDER — CYCLOBENZAPRINE HCL 10 MG PO TABS: 10.0000 mg | ORAL_TABLET | Freq: Two times a day (BID) | ORAL | Status: DC | PRN

## 2012-07-11 MED ORDER — HYDROCODONE-ACETAMINOPHEN 5-325 MG PO TABS: 1.0000 | ORAL_TABLET | Freq: Two times a day (BID) | ORAL | Status: DC | PRN

## 2012-07-11 NOTE — Progress Notes (Signed)
Subjective:    Patient ID: Kristopher [name], male    DOB: July 27, 1930, 79 y.o.   MRN: [id number]  HPI  Here to f/u; overall doing ok,  Pt denies chest pain, increased sob or doe, wheezing, orthopnea, PND, increased LE swelling, palpitations, dizziness or syncope.  Pt denies polydipsia, polyuria, or low sugar symptoms such as weakness or confusion improved with po intake.  Pt denies new neurological symptoms such as new headache, or facial or extremity weakness or numbness.   Pt states overall good compliance with meds, has been trying to follow lower cholesterol, diabetic diet, with wt overall stable,  but little exercise however.  Denies worsening reflux, abd pain, dysphagia, n/v, bowel change or blood, but needs PPI refill.  Chronic pain ok on current meds.  Saw urology per pt last in aug 2014 (no record on chart here) with advice to come back prn.  Does have recurring unilateral right hand MCP pain and swelling that correllates with use of the hand, worse recently after putting in a stone walkway.  Past Medical History  Diagnosis Date  . HTN (hypertension)   . CAD (coronary artery disease)     s/p PCI of LAD 1998;  s/p DES to RCA 05/2009;  Myoview 4/13: low risk, no ischemia, EF 56%  . Aortic stenosis     echo 4/12: normal LVF, mild LAE, no significant AS  . HLD (hyperlipidemia)   . PAD (peripheral artery disease)     s/p L CIA stent 09/2011 ([facility])  . Renal artery stenosis     s/p bilat RA stents  . Esophageal reflux   . DJD (degenerative joint disease), lumbar 07/06/2011  . Urine incontinence   . Acute myocardial infarction, unspecified site, episode of care unspecified 1998  . COPD (chronic obstructive pulmonary disease)   . AAA (abdominal aortic aneurysm)     ultrasound 07/2011:  2.7 x 2.8 cm   Past Surgical History  Procedure Laterality Date  . Pci of lad  05/1996    STENT X 4 TOTAL  . Hemorrhoid surgery  1954  . Cataract extraction w/ intraocular lens  implant, bilateral  YRS AGO  .  Vasectomy  1968  . Transurethral resection of prostate  08/11/2011    Procedure: TRANSURETHRAL RESECTION OF THE PROSTATE WITH GYRUS INSTRUMENTS;  Surgeon: Antony Haste, MD;  Location: [facility];  Service: Urology;  Laterality: N/A;       reports that he quit smoking about 24 years ago. He has never used smokeless tobacco. He reports that he does not drink alcohol or use illicit drugs. family history includes Heart disease in his father and mother. Allergies  Allergen Reactions  . Sulfa Antibiotics Rash   Current Outpatient Prescriptions on File Prior to Visit  Medication Sig Dispense Refill  . Ascorbic Acid (VITAMIN C) 500 MG tablet Take 500 mg by mouth daily.       Marland Kitchen aspirin 325 MG EC tablet Take 325 mg by mouth daily.      Marland Kitchen atorvastatin (LIPITOR) 80 MG tablet Take 1 tablet (80 mg total) by mouth daily.  90 tablet  3  . clopidogrel (PLAVIX) 75 MG tablet Take 1 tablet (75 mg total) by mouth daily.  90 tablet  4  . Co-Enzyme Q-10 100 MG CAPS Take 1 capsule by mouth daily.      Marland Kitchen ezetimibe (ZETIA) 10 MG tablet Take 1 tablet (10 mg total) by mouth daily with breakfast.  90 tablet  4  .  fish oil-omega-3 fatty acids 1000 MG capsule Take 1 g by mouth daily.       . Multiple Vitamin (MULTIVITAMIN) capsule Take 1 capsule by mouth daily.       . Omega-3 Fatty Acids (FISH OIL) 600 MG CAPS Take 1 capsule by mouth daily.      . ramipril (ALTACE) 10 MG capsule Take 1 capsule (10 mg total) by mouth daily with breakfast.  90 capsule  4  . vitamin E 400 UNIT capsule Take 400 Units by mouth daily.         No current facility-administered medications on file prior to visit.   Review of Systems  Constitutional: Negative for unexpected weight change, or unusual diaphoresis  HENT: Negative for tinnitus.   Eyes: Negative for photophobia and visual disturbance.  Respiratory: Negative for choking and stridor.   Gastrointestinal: Negative for vomiting and blood in stool.  Genitourinary: Negative for  hematuria and decreased urine volume.  Musculoskeletal: Negative for acute joint swelling Skin: Negative for color change and wound.  Neurological: Negative for tremors and numbness other than noted  Psychiatric/Behavioral: Negative for decreased concentration or  hyperactivity.       Objective:   Physical Exam BP 120/70  Pulse 70  Temp(Src) 97.4 F (36.3 C) (Oral)  Ht 5' 8.5" (1.74 m)  Wt 160 lb (72.576 kg)  BMI 23.97 kg/m2  SpO2 94% VS noted,  Constitutional: Pt appears well-developed and well-nourished.  HENT: Head: NCAT.  Right Ear: External ear normal.  Left Ear: External ear normal.  Eyes: Conjunctivae and EOM are normal. Pupils are equal, round, and reactive to light.  Neck: Normal range of motion. Neck supple.  Cardiovascular: Normal rate and regular rhythm.   Pulmonary/Chest: Effort normal and breath sounds normal.  Abd:  Soft, NT, non-distended, + BS Neurological: Pt is alert. Not confused  Skin: Skin is warm. No erythema.  Psychiatric: Pt behavior is normal. Thought content normal.  Has mild diffuse tender swelling at palmar MCP's only    Assessment & Plan:

## 2012-07-11 NOTE — Assessment & Plan Note (Signed)
stable overall by history and exam, recent data reviewed with pt, and pt to continue medical treatment as before,  to f/u any worsening symptoms or concerns BP Readings from Last 3 Encounters:  07/11/12 120/70  01/10/12 112/70  10/04/11 100/56

## 2012-07-11 NOTE — Assessment & Plan Note (Signed)
stable overall by history and exam, recent data reviewed with pt, and pt to continue medical treatment as before,  to f/u any worsening symptoms or concerns Lab Results  Component Value Date   LDLCALC 53 07/11/2012

## 2012-07-11 NOTE — Assessment & Plan Note (Signed)
stable overall by history and exam, recent data reviewed with pt, and pt to continue medical treatment as before,  to f/u any worsening symptoms or concerns Lab Results  Component Value Date   WBC 7.8 07/11/2012   HGB 14.0 07/11/2012   HCT 41.5 07/11/2012   PLT 219.0 07/11/2012   GLUCOSE 87 07/11/2012   CHOL 115 07/11/2012   TRIG 98.0 07/11/2012   HDL 42.70 07/11/2012   LDLCALC 53 07/11/2012   ALT 20 07/11/2012   AST 28 07/11/2012   NA 139 07/11/2012   K 4.4 07/11/2012   CL 108 07/11/2012   CREATININE 1.2 07/11/2012   BUN 26* 07/11/2012   CO2 25 07/11/2012   TSH 3.08 07/11/2012   INR 1.0 09/15/2011

## 2012-07-11 NOTE — Patient Instructions (Addendum)
Please continue all other medications as before, and refills have been done if requested, including the pain meds and prilosec. Please go to the LAB in the Basement (turn left off the elevator) for the tests to be done today You will be contacted by phone if any changes need to be made immediately.  Otherwise, you will receive a letter about your results with an explanation Please remember to sign up for My Chart if you have not done so, as this will be important to you in the future with finding out test results, communicating by private email, and scheduling acute appointments online when needed. I think you are due for Dr Crenshaw/card f/u in August 2014 Please return in 6 months, or sooner if needed, with Lab testing done 3-5 days before

## 2012-07-11 NOTE — Assessment & Plan Note (Signed)
Hx and exam most c/w overuse,  to f/u any worsening symptoms or concerns

## 2012-07-11 NOTE — Assessment & Plan Note (Signed)
stable overall by history and exam, and pt to continue medical treatment as before,  to f/u any worsening symptoms or concerns 

## 2012-08-10 ENCOUNTER — Other Ambulatory Visit: Payer: Self-pay | Admitting: Cardiology

## 2012-10-08 ENCOUNTER — Telehealth: Payer: Self-pay | Admitting: Cardiology

## 2012-10-08 NOTE — Telephone Encounter (Signed)
New Problem:    Patient called in because he claims that he took two nitro tablets within the past two days and would like to be seen this week.  Please call back.

## 2012-10-08 NOTE — Telephone Encounter (Signed)
Spoke with pt, last week he had an episode after working outside in the heat of chest pain. He took one NTG and got relief. He has done fine until yesterday when again after working outside in the heat he got nauseated, developed stomach pain and sl chest pain. He again took one NTG and got relief. It has been one year since his follow up appt. He reports being SOB from the heat when walking from the mailbox to the house. He does not think this is similar to the symptoms prior to previous stenting. He thinks it is from getting over heated. He was offered the next opening with the pa but he declined to wait to see dr Jens Som. He will call back with increase in symptoms or change.

## 2012-10-16 ENCOUNTER — Encounter: Payer: Self-pay | Admitting: Cardiology

## 2012-10-31 ENCOUNTER — Ambulatory Visit: Payer: Medicare Other | Admitting: Cardiology

## 2012-12-19 ENCOUNTER — Encounter: Payer: Self-pay | Admitting: Cardiology

## 2012-12-19 ENCOUNTER — Ambulatory Visit (INDEPENDENT_AMBULATORY_CARE_PROVIDER_SITE_OTHER): Payer: Medicare Other | Admitting: Cardiology

## 2012-12-19 VITALS — BP 110/70 | HR 60 | Wt 153.0 lb

## 2012-12-19 DIAGNOSIS — I714 Abdominal aortic aneurysm, without rupture: Secondary | ICD-10-CM | POA: Diagnosis not present

## 2012-12-19 DIAGNOSIS — I2510 Atherosclerotic heart disease of native coronary artery without angina pectoris: Secondary | ICD-10-CM | POA: Diagnosis not present

## 2012-12-19 MED ORDER — NITROGLYCERIN 0.4 MG SL SUBL: 0.4000 mg | SUBLINGUAL_TABLET | SUBLINGUAL | Status: DC | PRN

## 2012-12-19 MED ORDER — RAMIPRIL 10 MG PO CAPS: 10.0000 mg | ORAL_CAPSULE | Freq: Every day | ORAL | Status: DC

## 2012-12-19 MED ORDER — ATORVASTATIN CALCIUM 80 MG PO TABS: 80.0000 mg | ORAL_TABLET | Freq: Every day | ORAL | Status: DC

## 2012-12-19 NOTE — Assessment & Plan Note (Signed)
Continue statin. Discontinue zetia.

## 2012-12-19 NOTE — Assessment & Plan Note (Signed)
Continue aspirin and statin. 

## 2012-12-19 NOTE — Assessment & Plan Note (Signed)
Plan repeat ultrasound.

## 2012-12-19 NOTE — Patient Instructions (Addendum)
Your physician has requested that you have an abdominal aorta duplex. During this test, an ultrasound is used to evaluate the aorta. Allow 30 minutes for this exam. Do not eat after midnight the day before and avoid carbonated beverages  Your physician has recommended you make the following change in your medication: stop taking Plavix and Zetia and start using Nitroglycerin for chest pain. Please follow directions given to you in office and on medication bottle.  Your physician wants you to follow-up in: 1 year. You will receive a reminder letter in the mail two months in advance. If you don't receive a letter, please call our office to schedule the follow-up appointment.

## 2012-12-19 NOTE — Assessment & Plan Note (Signed)
Continue aspirin and statin. Discontinue Plavix. 

## 2012-12-19 NOTE — Assessment & Plan Note (Signed)
Blood pressure controlled. Continue present medications. 

## 2012-12-19 NOTE — Progress Notes (Signed)
HPI: FU CAD, s/p PCI of his LAD performed in February 1998. He also has PVD and has had prior bilateral renal artery stenting. Cardiac catheterization in February 2011: EF 60% and no renal artery stenosis, RCA 90% but no obstructive disease in the left system. He had PCI of his right coronary artery with a drug-eluting stent at that time. Echocardiogram in April of 2012 revealed normal LV function, mild LAE, no significant AS. Myoview 07/31/11: low risk, no ischemia, EF 56%. LE angiogram 09/20/11: Small infrarenal aortic aneurysm, no renal artery stenosis, ostial right CIA 60%, proximal left CIA severely calcified with 80-90%, left EIA occluded and filled by collats. He underwent intervention with angioplasty and stenting to the left CIA. Since he was last seen, he has mild dyspnea on exertion which is unchanged. He has mild chest tightness if he pushes his lawnmower but otherwise no chest pain. This is also unchanged. No pedal edema or syncope.   Current Outpatient Prescriptions  Medication Sig Dispense Refill  . Ascorbic Acid (VITAMIN C) 500 MG tablet Take 500 mg by mouth daily.       Marland Kitchen aspirin 325 MG EC tablet Take 325 mg by mouth daily.      Marland Kitchen atorvastatin (LIPITOR) 80 MG tablet TAKE 1 TABLET BY MOUTH DAILY  90 tablet  1  . BEE POLLEN PO Take by mouth.      . clopidogrel (PLAVIX) 75 MG tablet Take 1 tablet (75 mg total) by mouth daily.  90 tablet  4  . Co-Enzyme Q-10 100 MG CAPS Take 1 capsule by mouth daily.      . cyclobenzaprine (FLEXERIL) 10 MG tablet Take 1 tablet (10 mg total) by mouth 2 (two) times daily as needed for muscle spasms.  60 tablet  5  . ezetimibe (ZETIA) 10 MG tablet Take 1 tablet (10 mg total) by mouth daily with breakfast.  90 tablet  4  . fish oil-omega-3 fatty acids 1000 MG capsule Take 1 g by mouth daily.       Marland Kitchen HYDROcodone-acetaminophen (NORCO/VICODIN) 5-325 MG per tablet Take 1 tablet by mouth 2 (two) times daily as needed for pain.  60 tablet  5  . Multiple Vitamin  (MULTIVITAMIN) capsule Take 1 capsule by mouth daily.       . Omega-3 Fatty Acids (FISH OIL) 600 MG CAPS Take 1 capsule by mouth daily.      Marland Kitchen omeprazole (PRILOSEC) 40 MG capsule TAKE 1 CAPSULE BY MOUTH DAILY  90 capsule  3  . ramipril (ALTACE) 10 MG capsule Take 1 capsule (10 mg total) by mouth daily with breakfast.  90 capsule  4  . vitamin E 400 UNIT capsule Take 400 Units by mouth daily.         No current facility-administered medications for this visit.     Past Medical History  Diagnosis Date  . HTN (hypertension)   . CAD (coronary artery disease)     s/p PCI of LAD 1998;  s/p DES to RCA 05/2009;  Myoview 4/13: low risk, no ischemia, EF 56%  . Aortic stenosis     echo 4/12: normal LVF, mild LAE, no significant AS  . HLD (hyperlipidemia)   . PAD (peripheral artery disease)     s/p L CIA stent 09/2011 ([facility])  . Renal artery stenosis     s/p bilat RA stents  . Esophageal reflux   . DJD (degenerative joint disease), lumbar 07/06/2011  . Urine incontinence   . Acute myocardial  infarction, unspecified site, episode of care unspecified 1998  . COPD (chronic obstructive pulmonary disease)   . AAA (abdominal aortic aneurysm)     ultrasound 07/2011:  2.7 x 2.8 cm    Past Surgical History  Procedure Laterality Date  . Pci of lad  05/1996    STENT X 4 TOTAL  . Hemorrhoid surgery  1954  . Cataract extraction w/ intraocular lens  implant, bilateral  YRS AGO  . Vasectomy  1968  . Transurethral resection of prostate  08/11/2011    Procedure: TRANSURETHRAL RESECTION OF THE PROSTATE WITH GYRUS INSTRUMENTS;  Surgeon: Antony Haste, MD;  Location: [facility];  Service: Urology;  Laterality: N/A;       History   Social History  . Marital Status: Widowed    Spouse Name: N/A    Number of Children: N/A  . Years of Education: GED   Occupational History  . retired    Social History Main Topics  . Smoking status: Former Smoker -- 3.00 packs/day for 38 years    Quit date:  04/10/1988  . Smokeless tobacco: Never Used  . Alcohol Use: No  . Drug Use: No  . Sexual Activity: Not on file   Other Topics Concern  . Not on file   Social History Narrative  . No narrative on file    ROS: no fevers or chills, productive cough, hemoptysis, dysphasia, odynophagia, melena, hematochezia, dysuria, hematuria, rash, seizure activity, orthopnea, PND, pedal edema, claudication. Remaining systems are negative.  Physical Exam: Well-developed well-nourished in no acute distress.  Skin is warm and dry.  HEENT is normal.  Neck is supple.  Chest is clear to auscultation with normal expansion.  Cardiovascular exam is regular rate and rhythm.  Abdominal exam nontender or distended. No masses palpated. Extremities show no edema. neuro grossly intact  ECG sinus rhythm at a rate of 60. Right bundle branch block.

## 2012-12-26 ENCOUNTER — Encounter (INDEPENDENT_AMBULATORY_CARE_PROVIDER_SITE_OTHER): Payer: Medicare Other

## 2012-12-26 DIAGNOSIS — I739 Peripheral vascular disease, unspecified: Secondary | ICD-10-CM

## 2012-12-26 DIAGNOSIS — I714 Abdominal aortic aneurysm, without rupture, unspecified: Secondary | ICD-10-CM

## 2013-01-16 ENCOUNTER — Encounter: Payer: Self-pay | Admitting: Internal Medicine

## 2013-01-16 ENCOUNTER — Ambulatory Visit (INDEPENDENT_AMBULATORY_CARE_PROVIDER_SITE_OTHER): Payer: Medicare Other | Admitting: Internal Medicine

## 2013-01-16 VITALS — BP 108/80 | HR 66 | Temp 96.6°F | Ht 68.5 in | Wt 153.5 lb

## 2013-01-16 DIAGNOSIS — G8929 Other chronic pain: Secondary | ICD-10-CM | POA: Diagnosis not present

## 2013-01-16 DIAGNOSIS — I10 Essential (primary) hypertension: Secondary | ICD-10-CM

## 2013-01-16 DIAGNOSIS — E785 Hyperlipidemia, unspecified: Secondary | ICD-10-CM

## 2013-01-16 MED ORDER — HYDROCODONE-ACETAMINOPHEN 5-325 MG PO TABS: 1.0000 | ORAL_TABLET | Freq: Two times a day (BID) | ORAL | Status: DC | PRN

## 2013-01-16 MED ORDER — OMEPRAZOLE 40 MG PO CPDR: DELAYED_RELEASE_CAPSULE | ORAL | Status: DC

## 2013-01-16 MED ORDER — CYCLOBENZAPRINE HCL 10 MG PO TABS: 10.0000 mg | ORAL_TABLET | Freq: Two times a day (BID) | ORAL | Status: DC | PRN

## 2013-01-16 NOTE — Assessment & Plan Note (Signed)
stable overall by history and exam, recent data reviewed with pt, and pt to continue medical treatment as before,  to f/u any worsening symptoms or concerns Lab Results  Component Value Date   LDLCALC 53 07/11/2012

## 2013-01-16 NOTE — Assessment & Plan Note (Signed)
stable overall by history and exam, , and pt to continue medical treatment as before,  to f/u any worsening symptoms or concerns  

## 2013-01-16 NOTE — Progress Notes (Addendum)
Subjective:    Patient ID: Kristopher [name], male    DOB: [birth year], 79 y.o.   MRN: [id number]  HPI  Here to f/u; overall doing ok,  Pt denies chest pain, increased sob or doe, wheezing, orthopnea, PND, increased LE swelling, palpitations, dizziness or syncope.  Pt denies polydipsia, polyuria, or low sugar symptoms such as weakness or confusion improved with po intake.  Pt denies new neurological symptoms such as new headache, or facial or extremity weakness or numbness.   Pt states overall good compliance with meds, has been trying to follow lower cholesterol diet, with wt overall stable. Also with 3-4 days onset bilat ankle soreness and effusions with more standing recetnly.  His 52yo girlfriend has several health issues and pacemaker, with more stress recently for him.  They cant marry since she would lose her disability income. Chornic pain ok, has mutli-joint DJD.  Past Medical History  Diagnosis Date  . HTN (hypertension)   . CAD (coronary artery disease)     s/p PCI of LAD 1998;  s/p DES to RCA 05/2009;  Myoview 4/13: low risk, no ischemia, EF 56%  . Aortic stenosis     echo 4/12: normal LVF, mild LAE, no significant AS  . HLD (hyperlipidemia)   . PAD (peripheral artery disease)     s/p L CIA stent 09/2011 ([facility])  . Renal artery stenosis     s/p bilat RA stents  . Esophageal reflux   . DJD (degenerative joint disease), lumbar 07/06/2011  . Urine incontinence   . Acute myocardial infarction, unspecified site, episode of care unspecified 1998  . COPD (chronic obstructive pulmonary disease)   . AAA (abdominal aortic aneurysm)     ultrasound 07/2011:  2.7 x 2.8 cm   Past Surgical History  Procedure Laterality Date  . Pci of lad  05/1996    STENT X 4 TOTAL  . Hemorrhoid surgery  1954  . Cataract extraction w/ intraocular lens  implant, bilateral  YRS AGO  . Vasectomy  1968  . Transurethral resection of prostate  08/11/2011    Procedure: TRANSURETHRAL RESECTION OF THE PROSTATE WITH GYRUS  INSTRUMENTS;  Surgeon: Kristopher Haste, MD;  Location: [facility];  Service: Urology;  Laterality: N/A;       reports that he quit smoking about 24 years ago. He has never used smokeless tobacco. He reports that he does not drink alcohol or use illicit drugs. family history includes Heart disease in his father and mother. Allergies  Allergen Reactions  . Sulfa Antibiotics Rash   Current Outpatient Prescriptions on File Prior to Visit  Medication Sig Dispense Refill  . Ascorbic Acid (VITAMIN C) 500 MG tablet Take 500 mg by mouth daily.       Marland Kitchen aspirin 325 MG EC tablet Take 325 mg by mouth daily.      Marland Kitchen atorvastatin (LIPITOR) 80 MG tablet Take 1 tablet (80 mg total) by mouth daily.  90 tablet  3  . BEE POLLEN PO Take by mouth.      Marland Kitchen Co-Enzyme Q-10 100 MG CAPS Take 1 capsule by mouth daily.      . fish oil-omega-3 fatty acids 1000 MG capsule Take 1 g by mouth daily.       . Multiple Vitamin (MULTIVITAMIN) capsule Take 1 capsule by mouth daily.       . nitroGLYCERIN (NITROSTAT) 0.4 MG SL tablet Place 1 tablet (0.4 mg total) under the tongue every 5 (five) minutes as needed for chest  pain.  25 tablet  3  . Omega-3 Fatty Acids (FISH OIL) 600 MG CAPS Take 1 capsule by mouth daily.      . ramipril (ALTACE) 10 MG capsule Take 1 capsule (10 mg total) by mouth daily with breakfast.  90 capsule  3  . vitamin E 400 UNIT capsule Take 400 Units by mouth daily.         No current facility-administered medications on file prior to visit.   Review of Systems  Constitutional: Negative for unexpected weight change, or unusual diaphoresis  HENT: Negative for tinnitus.   Eyes: Negative for photophobia and visual disturbance.  Respiratory: Negative for choking and stridor.   Gastrointestinal: Negative for vomiting and blood in stool.  Genitourinary: Negative for hematuria and decreased urine volume.  Musculoskeletal: Negative for acute joint swelling Skin: Negative for color change and wound.   Neurological: Negative for tremors and numbness other than noted  Psychiatric/Behavioral: Negative for decreased concentration or  hyperactivity.       Objective:   Physical Exam BP 108/80  Pulse 66  Temp(Src) 96.6 F (35.9 C) (Oral)  Ht 5' 8.5" (1.74 m)  Wt 153 lb 8 oz (69.627 kg)  BMI 23 kg/m2  SpO2 95% VS noted, not il lapperaing Constitutional: Pt appears well-developed and well-nourished.  HENT: Head: NCAT.  Right Ear: External ear normal.  Left Ear: External ear normal.  Eyes: Conjunctivae and EOM are normal. Pupils are equal, round, and reactive to light.  Neck: Normal range of motion. Neck supple.  Cardiovascular: Normal rate and regular rhythm.   Pulmonary/Chest: Effort normal and breath sounds normal.  Abd:  Soft, NT, non-distended, + BS Neurological: Pt is alert. Not confused  Skin: Skin is warm. No erythema.  Bilat ankle effusions, mild tender to palpate to both ankles Psychiatric: Pt behavior is normal. Thought content normal.     Assessment & Plan:  Quality Measures addressed:  Pneumonia Vaccine: pt declines, may self-refer to local pharmacy

## 2013-01-16 NOTE — Patient Instructions (Signed)
Please continue all other medications as before, and refills have been done if requested. Please have the pharmacy call with any other refills you may need. No change today or lab work needed  Please remember to sign up for My Chart if you have not done so, as this will be important to you in the future with finding out test results, communicating by private email, and scheduling acute appointments online when needed.  Please return in 3 months, or sooner if needed

## 2013-03-26 ENCOUNTER — Telehealth: Payer: Self-pay | Admitting: Cardiology

## 2013-03-26 NOTE — Telephone Encounter (Signed)
New problem    Mr Kristopher [name] @ [facility]  Called ref # [id number] .   They would like to cancel the CRESTOR.    ATORVASTATIN is back in stock.   Please call if any question.

## 2013-03-27 ENCOUNTER — Other Ambulatory Visit: Payer: Self-pay | Admitting: *Deleted

## 2013-03-27 MED ORDER — ATORVASTATIN CALCIUM 80 MG PO TABS: 80.0000 mg | ORAL_TABLET | Freq: Every day | ORAL | Status: DC

## 2013-04-01 ENCOUNTER — Telehealth: Payer: Self-pay

## 2013-04-01 MED ORDER — OMEPRAZOLE 20 MG PO CPDR: DELAYED_RELEASE_CAPSULE | ORAL | Status: DC

## 2013-04-01 NOTE — Telephone Encounter (Signed)
Omeprazole 40 mg caps is temporarily out of stock.  Alternative is Omeprazole caps 20 mg.  Advise

## 2013-04-01 NOTE — Telephone Encounter (Signed)
Done erx 

## 2013-04-17 ENCOUNTER — Encounter: Payer: Self-pay | Admitting: Internal Medicine

## 2013-04-17 ENCOUNTER — Ambulatory Visit (INDEPENDENT_AMBULATORY_CARE_PROVIDER_SITE_OTHER): Payer: Medicare Other | Admitting: Internal Medicine

## 2013-04-17 VITALS — BP 110/68 | HR 83 | Temp 97.0°F | Wt 156.0 lb

## 2013-04-17 DIAGNOSIS — J449 Chronic obstructive pulmonary disease, unspecified: Secondary | ICD-10-CM | POA: Diagnosis not present

## 2013-04-17 DIAGNOSIS — I10 Essential (primary) hypertension: Secondary | ICD-10-CM

## 2013-04-17 DIAGNOSIS — J019 Acute sinusitis, unspecified: Secondary | ICD-10-CM

## 2013-04-17 DIAGNOSIS — G894 Chronic pain syndrome: Secondary | ICD-10-CM | POA: Diagnosis not present

## 2013-04-17 MED ORDER — LEVOFLOXACIN 250 MG PO TABS: 250.0000 mg | ORAL_TABLET | Freq: Every day | ORAL | Status: DC

## 2013-04-17 MED ORDER — HYDROCODONE-ACETAMINOPHEN 5-325 MG PO TABS: 1.0000 | ORAL_TABLET | Freq: Two times a day (BID) | ORAL | Status: DC | PRN

## 2013-04-17 MED ORDER — BENZONATATE 100 MG PO CAPS: ORAL_CAPSULE | ORAL | Status: DC

## 2013-04-17 NOTE — Progress Notes (Signed)
Subjective:    Patient ID: Kristopher [name], male    DOB: 1930-10-03, 80 y.o.   MRN: [id number]  HPI   Here with 2-3 wks acute onset fever, facial pain, pressure, headache, general weakness and malaise, and greenish d/c, with mild ST and cough, but pt denies chest pain, wheezing, increased sob or doe, orthopnea, PND, increased LE swelling, palpitations, dizziness or syncope.  Chronic pain stable, needs med refills.  Pt denies new neurological symptoms such as new headache, or facial or extremity weakness or numbness   Pt denies polydipsia, polyuria, Past Medical History  Diagnosis Date  . HTN (hypertension)   . CAD (coronary artery disease)     s/p PCI of LAD 1998;  s/p DES to RCA 05/2009;  Myoview 4/13: low risk, no ischemia, EF 56%  . Aortic stenosis     echo 4/12: normal LVF, mild LAE, no significant AS  . HLD (hyperlipidemia)   . PAD (peripheral artery disease)     s/p L CIA stent 09/2011 ([facility])  . Renal artery stenosis     s/p bilat RA stents  . Esophageal reflux   . DJD (degenerative joint disease), lumbar 07/06/2011  . Urine incontinence   . Acute myocardial infarction, unspecified site, episode of care unspecified 1998  . COPD (chronic obstructive pulmonary disease)   . AAA (abdominal aortic aneurysm)     ultrasound 07/2011:  2.7 x 2.8 cm   Past Surgical History  Procedure Laterality Date  . Pci of lad  05/1996    STENT X 4 TOTAL  . Hemorrhoid surgery  1954  . Cataract extraction w/ intraocular lens  implant, bilateral  YRS AGO  . Vasectomy  1968  . Transurethral resection of prostate  08/11/2011    Procedure: TRANSURETHRAL RESECTION OF THE PROSTATE WITH GYRUS INSTRUMENTS;  Surgeon: Fredricka Bonine, MD;  Location: [facility];  Service: Urology;  Laterality: N/A;       reports that he quit smoking about 25 years ago. He has never used smokeless tobacco. He reports that he does not drink alcohol or use illicit drugs. family history includes Heart disease in his father and  mother. Allergies  Allergen Reactions  . Influenza Vaccines   . Sulfa Antibiotics Rash   Current Outpatient Prescriptions on File Prior to Visit  Medication Sig Dispense Refill  . Ascorbic Acid (VITAMIN C) 500 MG tablet Take 500 mg by mouth daily.       Marland Kitchen aspirin 325 MG EC tablet Take 325 mg by mouth daily.      Marland Kitchen atorvastatin (LIPITOR) 80 MG tablet Take 1 tablet (80 mg total) by mouth daily.  90 tablet  3  . BEE POLLEN PO Take by mouth.      Marland Kitchen Co-Enzyme Q-10 100 MG CAPS Take 1 capsule by mouth daily.      . cyclobenzaprine (FLEXERIL) 10 MG tablet Take 1 tablet (10 mg total) by mouth 2 (two) times daily as needed for muscle spasms.  60 tablet  5  . fish oil-omega-3 fatty acids 1000 MG capsule Take 1 g by mouth daily.       Marland Kitchen HYDROcodone-acetaminophen (NORCO/VICODIN) 5-325 MG per tablet Take 1 tablet by mouth 2 (two) times daily as needed for pain. To fill Mar 17, 2013  60 tablet  0  . Multiple Vitamin (MULTIVITAMIN) capsule Take 1 capsule by mouth daily.       . nitroGLYCERIN (NITROSTAT) 0.4 MG SL tablet Place 1 tablet (0.4 mg total) under  the tongue every 5 (five) minutes as needed for chest pain.  25 tablet  3  . Omega-3 Fatty Acids (FISH OIL) 600 MG CAPS Take 1 capsule by mouth daily.      Marland Kitchen omeprazole (PRILOSEC) 20 MG capsule 2 tabs by mouth per day  180 capsule  3  . ramipril (ALTACE) 10 MG capsule Take 1 capsule (10 mg total) by mouth daily with breakfast.  90 capsule  3  . vitamin E 400 UNIT capsule Take 400 Units by mouth daily.         No current facility-administered medications on file prior to visit.    Review of Systems  Constitutional: Negative for unexpected weight change, or unusual diaphoresis  HENT: Negative for tinnitus.   Eyes: Negative for photophobia and visual disturbance.  Respiratory: Negative for choking and stridor.   Gastrointestinal: Negative for vomiting and blood in stool.  Genitourinary: Negative for hematuria and decreased urine volume.    Musculoskeletal: Negative for acute joint swelling Skin: Negative for color change and wound.  Neurological: Negative for tremors and numbness other than noted  Psychiatric/Behavioral: Negative for decreased concentration or  hyperactivity.       Objective:   Physical Exam BP 110/68  Pulse 83  Temp(Src) 97 F (36.1 C) (Oral)  Wt 156 lb (70.761 kg)  SpO2 95% VS noted, .mild ill Constitutional: Pt appears well-developed and well-nourished.  HENT: Head: NCAT.  Right Ear: External ear normal.  Left Ear: External ear normal.  Bilat tm's with mild erythema.  Max sinus areas mild tender.  Pharynx with mild erythema, no exudate Eyes: Conjunctivae and EOM are normal. Pupils are equal, round, and reactive to light.  Neck: Normal range of motion. Neck supple.  Cardiovascular: Normal rate and regular rhythm.   Pulmonary/Chest: Effort normal and breath sounds decrreasesd, no rales or wheezing.  Neurological: Pt is alert. Not confused  Skin: Skin is warm. No erythema.  Psychiatric: Pt behavior is normal. Thought content normal.      Assessment & Plan:

## 2013-04-17 NOTE — Assessment & Plan Note (Signed)
Mild to mod, for antibx course,  to f/u any worsening symptoms or concerns 

## 2013-04-17 NOTE — Progress Notes (Signed)
Pre-visit discussion using our clinic review tool. No additional management support is needed unless otherwise documented below in the visit note.  

## 2013-04-17 NOTE — Patient Instructions (Signed)
Please take all new medication as prescribed - the antibiotic, and pills for cough You can also take Delsym OTC for cough, and/or Mucinex (or it's generic off brand) for congestion, and tylenol as needed for pain. Please continue all other medications as before, and refills have been done if requested - the pain medication You are given the letter today explaining the transitional pain medication refill policy Please be aware that I will no longer be able to offer monthly refills of any Schedule II or higher medication starting May 11, 2013 Please have the pharmacy call with any other refills you may need.  You will be contacted regarding the referral for: pain management  Please return in 3 months, or sooner if needed

## 2013-04-17 NOTE — Assessment & Plan Note (Signed)
For med refill, also refer pain clinic, letter given regarding pain med refill policy change

## 2013-04-17 NOTE — Assessment & Plan Note (Signed)
stable overall by history and exam, recent data reviewed with pt, and pt to continue medical treatment as before,  to f/u any worsening symptoms or concerns BP Readings from Last 3 Encounters:  04/17/13 110/68  01/16/13 108/80  12/19/12 110/70

## 2013-04-17 NOTE — Assessment & Plan Note (Signed)
stable overall by history and exam, recent data reviewed with pt, and pt to continue medical treatment as before,  to f/u any worsening symptoms or concerns SpO2 Readings from Last 3 Encounters:  04/17/13 95%  01/16/13 95%  07/11/12 94%

## 2013-04-18 ENCOUNTER — Telehealth: Payer: Self-pay | Admitting: Internal Medicine

## 2013-04-18 NOTE — Telephone Encounter (Signed)
Relevant patient education mailed to patient.  

## 2013-07-16 ENCOUNTER — Encounter: Payer: Self-pay | Admitting: Internal Medicine

## 2013-07-16 ENCOUNTER — Other Ambulatory Visit (INDEPENDENT_AMBULATORY_CARE_PROVIDER_SITE_OTHER): Payer: Medicare Other

## 2013-07-16 ENCOUNTER — Ambulatory Visit (INDEPENDENT_AMBULATORY_CARE_PROVIDER_SITE_OTHER): Payer: Medicare Other | Admitting: Internal Medicine

## 2013-07-16 VITALS — BP 110/82 | HR 79 | Temp 96.7°F | Wt 150.2 lb

## 2013-07-16 DIAGNOSIS — J449 Chronic obstructive pulmonary disease, unspecified: Secondary | ICD-10-CM | POA: Diagnosis not present

## 2013-07-16 DIAGNOSIS — G894 Chronic pain syndrome: Secondary | ICD-10-CM | POA: Diagnosis not present

## 2013-07-16 DIAGNOSIS — E785 Hyperlipidemia, unspecified: Secondary | ICD-10-CM | POA: Diagnosis not present

## 2013-07-16 DIAGNOSIS — Z79899 Other long term (current) drug therapy: Secondary | ICD-10-CM | POA: Diagnosis not present

## 2013-07-16 DIAGNOSIS — I10 Essential (primary) hypertension: Secondary | ICD-10-CM

## 2013-07-16 LAB — BASIC METABOLIC PANEL
BUN: 31 mg/dL — ABNORMAL HIGH (ref 6–23)
CO2: 26 mEq/L (ref 19–32)
Calcium: 9.4 mg/dL (ref 8.4–10.5)
Chloride: 106 mEq/L (ref 96–112)
Creatinine, Ser: 1.5 mg/dL (ref 0.4–1.5)
GFR: 49.28 mL/min — ABNORMAL LOW (ref >60.00)
Glucose, Bld: 85 mg/dL (ref 70–99)
Potassium: 5.7 mEq/L — ABNORMAL HIGH (ref 3.5–5.1)
Sodium: 139 mEq/L (ref 135–145)

## 2013-07-16 LAB — CBC WITH DIFFERENTIAL/PLATELET
Basophils Absolute: 0.1 10*3/uL (ref 0.0–0.1)
Basophils Relative: 0.8 % (ref 0.0–3.0)
Eosinophils Absolute: 0.4 10*3/uL (ref 0.0–0.7)
Eosinophils Relative: 5 % (ref 0.0–5.0)
HCT: 43.2 % (ref 39.0–52.0)
Hemoglobin: 14.5 g/dL (ref 13.0–17.0)
Lymphocytes Relative: 32 % (ref 12.0–46.0)
Lymphs Abs: 2.7 10*3/uL (ref 0.7–4.0)
MCHC: 33.5 g/dL (ref 30.0–36.0)
MCV: 91.6 fl (ref 78.0–100.0)
Monocytes Absolute: 0.7 10*3/uL (ref 0.1–1.0)
Monocytes Relative: 8.5 % (ref 3.0–12.0)
Neutro Abs: 4.6 10*3/uL (ref 1.4–7.7)
Neutrophils Relative %: 53.7 % (ref 43.0–77.0)
Platelets: 245 10*3/uL (ref 150.0–400.0)
RBC: 4.71 Mil/uL (ref 4.22–5.81)
RDW: 15.4 % — ABNORMAL HIGH (ref 11.5–14.6)
WBC: 8.5 10*3/uL (ref 4.5–10.5)

## 2013-07-16 LAB — HEPATIC FUNCTION PANEL
ALT: 23 U/L (ref 0–53)
AST: 28 U/L (ref 0–37)
Albumin: 3.9 g/dL (ref 3.5–5.2)
Alkaline Phosphatase: 76 U/L (ref 39–117)
Bilirubin, Direct: 0.1 mg/dL (ref 0.0–0.3)
Total Bilirubin: 0.7 mg/dL (ref 0.3–1.2)
Total Protein: 7 g/dL (ref 6.0–8.3)

## 2013-07-16 LAB — LIPID PANEL
Cholesterol: 129 mg/dL (ref 0–200)
HDL: 45.1 mg/dL (ref >39.00)
LDL Cholesterol: 67 mg/dL (ref 0–99)
Total CHOL/HDL Ratio: 3
Triglycerides: 86 mg/dL (ref 0.0–149.0)
VLDL: 17.2 mg/dL (ref 0.0–40.0)

## 2013-07-16 LAB — TSH: TSH: 2.13 u[IU]/mL (ref 0.35–5.50)

## 2013-07-16 MED ORDER — HYDROCODONE-ACETAMINOPHEN 5-325 MG PO TABS: 1.0000 | ORAL_TABLET | Freq: Two times a day (BID) | ORAL | Status: DC | PRN

## 2013-07-16 NOTE — Progress Notes (Signed)
Subjective:    Patient ID: Kristopher [name], male    DOB: 1930-05-11, 80 y.o.   MRN: [id number]  HPI  Here for wellness and f/u;  Overall doing ok;  Pt denies CP, worsening SOB, DOE, wheezing, orthopnea, PND, worsening LE edema, palpitations, dizziness or syncope.  Pt denies neurological change such as new headache, facial or extremity weakness.  Pt denies polydipsia, polyuria, or low sugar symptoms. Pt states overall good compliance with treatment and medications, good tolerability, and has been trying to follow lower cholesterol diet.  Pt denies worsening depressive symptoms, suicidal ideation or panic. No fever, night sweats, wt loss, loss of appetite, or other constitutional symptoms.  Pt states good ability with ADL's, has low fall risk, home safety reviewed and adequate, no other significant changes in hearing or vision, and only occasionally active with exercise.  Overall pain ok, needs pain med refill, and pain clinic referral.  Girlfriend sees pain management as well.  Declines all immunizations today - fearful of getting sick from them.  Quit smoking x 25 yrs Past Medical History  Diagnosis Date  . HTN (hypertension)   . CAD (coronary artery disease)     s/p PCI of LAD 1998;  s/p DES to RCA 05/2009;  Myoview 4/13: low risk, no ischemia, EF 56%  . Aortic stenosis     echo 4/12: normal LVF, mild LAE, no significant AS  . HLD (hyperlipidemia)   . PAD (peripheral artery disease)     s/p L CIA stent 09/2011 ([facility])  . Renal artery stenosis     s/p bilat RA stents  . Esophageal reflux   . DJD (degenerative joint disease), lumbar 07/06/2011  . Urine incontinence   . Acute myocardial infarction, unspecified site, episode of care unspecified 1998  . COPD (chronic obstructive pulmonary disease)   . AAA (abdominal aortic aneurysm)     ultrasound 07/2011:  2.7 x 2.8 cm   Past Surgical History  Procedure Laterality Date  . Pci of lad  05/1996    STENT X 4 TOTAL  . Hemorrhoid surgery  1954  .  Cataract extraction w/ intraocular lens  implant, bilateral  YRS AGO  . Vasectomy  1968  . Transurethral resection of prostate  08/11/2011    Procedure: TRANSURETHRAL RESECTION OF THE PROSTATE WITH GYRUS INSTRUMENTS;  Surgeon: Fredricka Bonine, MD;  Location: [facility];  Service: Urology;  Laterality: N/A;       reports that he quit smoking about 25 years ago. He has never used smokeless tobacco. He reports that he does not drink alcohol or use illicit drugs. family history includes Heart disease in his father and mother. Allergies  Allergen Reactions  . Influenza Vaccines   . Sulfa Antibiotics Rash   Current Outpatient Prescriptions on File Prior to Visit  Medication Sig Dispense Refill  . Ascorbic Acid (VITAMIN C) 500 MG tablet Take 500 mg by mouth daily.       Marland Kitchen aspirin 325 MG EC tablet Take 325 mg by mouth daily.      Marland Kitchen atorvastatin (LIPITOR) 80 MG tablet Take 1 tablet (80 mg total) by mouth daily.  90 tablet  3  . BEE POLLEN PO Take by mouth.      . benzonatate (TESSALON) 100 MG capsule 1-2 tab by mouth every 6 hrs as needed for cough  60 capsule  1  . Co-Enzyme Q-10 100 MG CAPS Take 1 capsule by mouth daily.      . cyclobenzaprine (FLEXERIL)  10 MG tablet Take 1 tablet (10 mg total) by mouth 2 (two) times daily as needed for muscle spasms.  60 tablet  5  . fish oil-omega-3 fatty acids 1000 MG capsule Take 1 g by mouth daily.       Marland Kitchen HYDROcodone-acetaminophen (NORCO/VICODIN) 5-325 MG per tablet Take 1 tablet by mouth 2 (two) times daily as needed. To fill Jun 15, 2013  60 tablet  0  . Multiple Vitamin (MULTIVITAMIN) capsule Take 1 capsule by mouth daily.       . nitroGLYCERIN (NITROSTAT) 0.4 MG SL tablet Place 1 tablet (0.4 mg total) under the tongue every 5 (five) minutes as needed for chest pain.  25 tablet  3  . omeprazole (PRILOSEC) 20 MG capsule 2 tabs by mouth per day  180 capsule  3  . ramipril (ALTACE) 10 MG capsule Take 1 capsule (10 mg total) by mouth daily with breakfast.   90 capsule  3  . vitamin E 400 UNIT capsule Take 400 Units by mouth daily.         No current facility-administered medications on file prior to visit.   Review of Systems Constitutional: Negative for diaphoresis, activity change, appetite change or unexpected weight change.  HENT: Negative for hearing loss, ear pain, facial swelling, mouth sores and neck stiffness.   Eyes: Negative for pain, redness and visual disturbance.  Respiratory: Negative for shortness of breath and wheezing.   Cardiovascular: Negative for chest pain and palpitations.  Gastrointestinal: Negative for diarrhea, blood in stool, abdominal distention or other pain Genitourinary: Negative for hematuria, flank pain or change in urine volume.  Musculoskeletal: Negative for myalgias and joint swelling.  Skin: Negative for color change and wound.  Neurological: Negative for syncope and numbness. other than noted Hematological: Negative for adenopathy.  Psychiatric/Behavioral: Negative for hallucinations, self-injury, decreased concentration and agitation.      Objective:   Physical Exam BP 110/82  Pulse 79  Temp(Src) 96.7 F (35.9 C) (Oral)  Wt 150 lb 4 oz (68.153 kg)  SpO2 97% VS noted,  Constitutional: Pt is oriented to person, place, and time. Appears well-developed and well-nourished.  Head: Normocephalic and atraumatic.  Right Ear: External ear normal.  Left Ear: External ear normal.  Nose: Nose normal.  Mouth/Throat: Oropharynx is clear and moist.  Eyes: Conjunctivae and EOM are normal. Pupils are equal, round, and reactive to light.  Neck: Normal range of motion. Neck supple. No JVD present. No tracheal deviation present.  Cardiovascular: Normal rate, regular rhythm, normal heart sounds and intact distal pulses.   Pulmonary/Chest: Effort normal and breath sounds decreased, no rales or wheezing.  Abdominal: Soft. Bowel sounds are normal. There is no tenderness. No HSM  Musculoskeletal: Normal range of  motion. Exhibits no edema.  Lymphadenopathy:  Has no cervical adenopathy.  Neurological: Pt is alert and oriented to person, place, and time. Pt has normal reflexes. No cranial nerve deficit.  Skin: Skin is warm and dry. No rash noted.  Psychiatric:  Has  normal mood and affect. Behavior is normal. mild nervous    Assessment & Plan:

## 2013-07-16 NOTE — Assessment & Plan Note (Signed)
stable overall by history and exam, recent data reviewed with pt, and pt to continue medical treatment as before,  to f/u any worsening symptoms or concerns Lab Results  Component Value Date   LDLCALC 53 07/11/2012   For f/u labs todya

## 2013-07-16 NOTE — Patient Instructions (Addendum)
Please continue all other medications as before, and refills have been done if requested - the pain medication Please have the pharmacy call with any other refills you may need.  Please continue your efforts at being more active, low cholesterol diet, and weight control. You are otherwise up to date with prevention measures today.  You will be contacted regarding the referral for: pain clinic Remember I will no longer be able to do long term pain management after your prescriptions today  Please go to the LAB in the Basement (turn left off the elevator) for the tests to be done today You will be contacted by phone if any changes need to be made immediately.  Otherwise, you will receive a letter about your results with an explanation, but please check with MyChart first.  Please return in 6 months, or sooner if needed

## 2013-07-16 NOTE — Assessment & Plan Note (Signed)
stable overall by history and exam, recent data reviewed with pt, and pt to continue medical treatment as before,  to f/u any worsening symptoms or concerns SpO2 Readings from Last 3 Encounters:  07/16/13 97%  04/17/13 95%  01/16/13 95%

## 2013-07-16 NOTE — Progress Notes (Signed)
Pre visit review using our clinic review tool, if applicable. No additional management support is needed unless otherwise documented below in the visit note. 

## 2013-07-16 NOTE — Assessment & Plan Note (Signed)
[place] for 3 mo refills, refer pain management

## 2013-07-16 NOTE — Assessment & Plan Note (Signed)
stable overall by history and exam, recent data reviewed with pt, and pt to continue medical treatment as before,  to f/u any worsening symptoms or concerns  BP Readings from Last 3 Encounters:  07/16/13 110/82  04/17/13 110/68  01/16/13 108/80

## 2013-08-11 ENCOUNTER — Encounter: Payer: Self-pay | Admitting: Internal Medicine

## 2013-08-12 ENCOUNTER — Encounter: Payer: Self-pay | Admitting: Physical Medicine & Rehabilitation

## 2013-08-31 ENCOUNTER — Inpatient Hospital Stay (HOSPITAL_COMMUNITY)
Admission: EM | Admit: 2013-08-31 | Discharge: 2013-09-06 | DRG: 552 | Disposition: A | Discharge status: Home Health | Payer: Medicare Other | Attending: Internal Medicine | Admitting: Internal Medicine

## 2013-08-31 ENCOUNTER — Inpatient Hospital Stay (HOSPITAL_COMMUNITY): Payer: Medicare Other

## 2013-08-31 ENCOUNTER — Encounter (HOSPITAL_COMMUNITY): Payer: Self-pay | Admitting: Emergency Medicine

## 2013-08-31 ENCOUNTER — Emergency Department (HOSPITAL_COMMUNITY): Payer: Medicare Other

## 2013-08-31 DIAGNOSIS — I701 Atherosclerosis of renal artery: Secondary | ICD-10-CM

## 2013-08-31 DIAGNOSIS — E785 Hyperlipidemia, unspecified: Secondary | ICD-10-CM

## 2013-08-31 DIAGNOSIS — M47816 Spondylosis without myelopathy or radiculopathy, lumbar region: Secondary | ICD-10-CM

## 2013-08-31 DIAGNOSIS — K219 Gastro-esophageal reflux disease without esophagitis: Secondary | ICD-10-CM

## 2013-08-31 DIAGNOSIS — IMO0002 Reserved for concepts with insufficient information to code with codable children: Secondary | ICD-10-CM | POA: Diagnosis not present

## 2013-08-31 DIAGNOSIS — M545 Low back pain, unspecified: Secondary | ICD-10-CM | POA: Diagnosis not present

## 2013-08-31 DIAGNOSIS — I739 Peripheral vascular disease, unspecified: Secondary | ICD-10-CM | POA: Diagnosis present

## 2013-08-31 DIAGNOSIS — I359 Nonrheumatic aortic valve disorder, unspecified: Secondary | ICD-10-CM | POA: Diagnosis not present

## 2013-08-31 DIAGNOSIS — Z9861 Coronary angioplasty status: Secondary | ICD-10-CM | POA: Diagnosis not present

## 2013-08-31 DIAGNOSIS — R0602 Shortness of breath: Secondary | ICD-10-CM

## 2013-08-31 DIAGNOSIS — Z87891 Personal history of nicotine dependence: Secondary | ICD-10-CM

## 2013-08-31 DIAGNOSIS — I714 Abdominal aortic aneurysm, without rupture, unspecified: Secondary | ICD-10-CM | POA: Diagnosis not present

## 2013-08-31 DIAGNOSIS — I252 Old myocardial infarction: Secondary | ICD-10-CM

## 2013-08-31 DIAGNOSIS — K859 Acute pancreatitis without necrosis or infection, unspecified: Secondary | ICD-10-CM

## 2013-08-31 DIAGNOSIS — J4489 Other specified chronic obstructive pulmonary disease: Secondary | ICD-10-CM | POA: Diagnosis present

## 2013-08-31 DIAGNOSIS — M546 Pain in thoracic spine: Secondary | ICD-10-CM | POA: Diagnosis not present

## 2013-08-31 DIAGNOSIS — I441 Atrioventricular block, second degree: Secondary | ICD-10-CM | POA: Diagnosis present

## 2013-08-31 DIAGNOSIS — G894 Chronic pain syndrome: Secondary | ICD-10-CM | POA: Diagnosis not present

## 2013-08-31 DIAGNOSIS — Z7982 Long term (current) use of aspirin: Secondary | ICD-10-CM | POA: Diagnosis not present

## 2013-08-31 DIAGNOSIS — I2510 Atherosclerotic heart disease of native coronary artery without angina pectoris: Secondary | ICD-10-CM | POA: Diagnosis present

## 2013-08-31 DIAGNOSIS — M25569 Pain in unspecified knee: Secondary | ICD-10-CM | POA: Diagnosis not present

## 2013-08-31 DIAGNOSIS — I10 Essential (primary) hypertension: Secondary | ICD-10-CM | POA: Diagnosis present

## 2013-08-31 DIAGNOSIS — M1710 Unilateral primary osteoarthritis, unspecified knee: Secondary | ICD-10-CM | POA: Diagnosis not present

## 2013-08-31 DIAGNOSIS — J449 Chronic obstructive pulmonary disease, unspecified: Secondary | ICD-10-CM | POA: Diagnosis present

## 2013-08-31 DIAGNOSIS — M549 Dorsalgia, unspecified: Secondary | ICD-10-CM

## 2013-08-31 DIAGNOSIS — Z0000 Encounter for general adult medical examination without abnormal findings: Secondary | ICD-10-CM

## 2013-08-31 DIAGNOSIS — I219 Acute myocardial infarction, unspecified: Secondary | ICD-10-CM

## 2013-08-31 DIAGNOSIS — I77811 Abdominal aortic ectasia: Secondary | ICD-10-CM | POA: Diagnosis not present

## 2013-08-31 DIAGNOSIS — M79641 Pain in right hand: Secondary | ICD-10-CM

## 2013-08-31 DIAGNOSIS — M25559 Pain in unspecified hip: Secondary | ICD-10-CM | POA: Diagnosis not present

## 2013-08-31 LAB — BASIC METABOLIC PANEL
BUN: 24 mg/dL — ABNORMAL HIGH (ref 6–23)
CO2: 24 mEq/L (ref 19–32)
Calcium: 9.5 mg/dL (ref 8.4–10.5)
Chloride: 106 mEq/L (ref 96–112)
Creatinine, Ser: 1.01 mg/dL (ref 0.50–1.35)
GFR calc Af Amer: 79 mL/min — ABNORMAL LOW (ref >90)
GFR calc non Af Amer: 68 mL/min — ABNORMAL LOW (ref >90)
Glucose, Bld: 96 mg/dL (ref 70–99)
Potassium: 4.4 mEq/L (ref 3.7–5.3)
Sodium: 142 mEq/L (ref 137–147)

## 2013-08-31 LAB — CBC WITH DIFFERENTIAL/PLATELET
Basophils Absolute: 0 K/uL (ref 0.0–0.1)
Basophils Relative: 1 % (ref 0–1)
Eosinophils Absolute: 0.4 K/uL (ref 0.0–0.7)
Eosinophils Relative: 5 % (ref 0–5)
HCT: 37.4 % — ABNORMAL LOW (ref 39.0–52.0)
Hemoglobin: 12.3 g/dL — ABNORMAL LOW (ref 13.0–17.0)
Lymphocytes Relative: 27 % (ref 12–46)
Lymphs Abs: 2.2 K/uL (ref 0.7–4.0)
MCH: 29.6 pg (ref 26.0–34.0)
MCHC: 32.9 g/dL (ref 30.0–36.0)
MCV: 90.1 fL (ref 78.0–100.0)
Monocytes Absolute: 0.8 K/uL (ref 0.1–1.0)
Monocytes Relative: 10 % (ref 3–12)
Neutro Abs: 4.7 K/uL (ref 1.7–7.7)
Neutrophils Relative %: 57 % (ref 43–77)
Platelets: 229 K/uL (ref 150–400)
RBC: 4.15 MIL/uL — ABNORMAL LOW (ref 4.22–5.81)
RDW: 15.1 % (ref 11.5–15.5)
WBC: 8.1 K/uL (ref 4.0–10.5)

## 2013-08-31 LAB — PROTIME-INR
INR: 1.01 (ref 0.00–1.49)
Prothrombin Time: 13.1 seconds (ref 11.6–15.2)

## 2013-08-31 LAB — APTT: aPTT: 30 s (ref 24–37)

## 2013-08-31 LAB — LACTIC ACID, PLASMA: Lactic Acid, Venous: 2.1 mmol/L (ref 0.5–2.2)

## 2013-08-31 MED ORDER — ASPIRIN EC 325 MG PO TBEC
325.0000 mg | DELAYED_RELEASE_TABLET | Freq: Every day | ORAL | Status: DC
  Administered 2013-09-01 – 2013-09-06 (×6): 325 mg via ORAL
  Filled 2013-08-31 (×6): qty 1

## 2013-08-31 MED ORDER — OMEGA-3 FATTY ACIDS 1000 MG PO CAPS: 1.0000 g | ORAL_CAPSULE | Freq: Every day | ORAL | Status: DC

## 2013-08-31 MED ORDER — MULTIVITAMINS PO CAPS: 1.0000 | ORAL_CAPSULE | Freq: Every day | ORAL | Status: DC

## 2013-08-31 MED ORDER — HYDROMORPHONE HCL PF 1 MG/ML IJ SOLN
1.0000 mg | Freq: Once | INTRAMUSCULAR | Status: AC
  Administered 2013-08-31: 1 mg via INTRAVENOUS
  Filled 2013-08-31: qty 1

## 2013-08-31 MED ORDER — ACETAMINOPHEN 325 MG PO TABS: 650.0000 mg | ORAL_TABLET | Freq: Four times a day (QID) | ORAL | Status: DC | PRN

## 2013-08-31 MED ORDER — PANTOPRAZOLE SODIUM 40 MG PO TBEC
40.0000 mg | DELAYED_RELEASE_TABLET | Freq: Every day | ORAL | Status: DC
  Administered 2013-09-01 – 2013-09-06 (×6): 40 mg via ORAL
  Filled 2013-08-31 (×5): qty 1

## 2013-08-31 MED ORDER — KETOROLAC TROMETHAMINE 30 MG/ML IJ SOLN
15.0000 mg | Freq: Once | INTRAMUSCULAR | Status: AC
  Administered 2013-08-31: 15 mg via INTRAVENOUS
  Filled 2013-08-31: qty 1

## 2013-08-31 MED ORDER — DEXAMETHASONE SODIUM PHOSPHATE 10 MG/ML IJ SOLN
10.0000 mg | Freq: Once | INTRAMUSCULAR | Status: AC
  Administered 2013-08-31: 10 mg via INTRAVENOUS
  Filled 2013-08-31: qty 1

## 2013-08-31 MED ORDER — NITROGLYCERIN 0.4 MG SL SUBL: 0.4000 mg | SUBLINGUAL_TABLET | SUBLINGUAL | Status: DC | PRN

## 2013-08-31 MED ORDER — HYDROMORPHONE HCL PF 1 MG/ML IJ SOLN
1.0000 mg | INTRAMUSCULAR | Status: DC | PRN
  Administered 2013-09-01 (×2): 1 mg via INTRAVENOUS
  Filled 2013-08-31 (×2): qty 1

## 2013-08-31 MED ORDER — FENTANYL CITRATE 0.05 MG/ML IJ SOLN
100.0000 ug | Freq: Once | INTRAMUSCULAR | Status: AC
  Administered 2013-08-31: 100 ug via INTRAVENOUS
  Filled 2013-08-31: qty 2

## 2013-08-31 MED ORDER — ADULT MULTIVITAMIN W/MINERALS CH
1.0000 | ORAL_TABLET | Freq: Every day | ORAL | Status: DC
  Administered 2013-09-01 – 2013-09-06 (×6): 1 via ORAL
  Filled 2013-08-31 (×6): qty 1

## 2013-08-31 MED ORDER — ATORVASTATIN CALCIUM 80 MG PO TABS
80.0000 mg | ORAL_TABLET | Freq: Every day | ORAL | Status: DC
  Administered 2013-09-01 – 2013-09-06 (×6): 80 mg via ORAL
  Filled 2013-08-31 (×6): qty 1

## 2013-08-31 MED ORDER — DIAZEPAM 5 MG/ML IJ SOLN
2.5000 mg | Freq: Once | INTRAMUSCULAR | Status: AC
  Administered 2013-08-31: 2.5 mg via INTRAVENOUS
  Filled 2013-08-31: qty 2

## 2013-08-31 MED ORDER — BENZONATATE 100 MG PO CAPS
100.0000 mg | ORAL_CAPSULE | Freq: Three times a day (TID) | ORAL | Status: DC | PRN
  Filled 2013-08-31: qty 1

## 2013-08-31 MED ORDER — OMEGA-3-ACID ETHYL ESTERS 1 G PO CAPS
1.0000 g | ORAL_CAPSULE | Freq: Every day | ORAL | Status: DC
  Administered 2013-09-01 – 2013-09-06 (×6): 1 g via ORAL
  Filled 2013-08-31 (×6): qty 1

## 2013-08-31 MED ORDER — ONDANSETRON HCL 4 MG PO TABS
4.0000 mg | ORAL_TABLET | Freq: Four times a day (QID) | ORAL | Status: DC | PRN
  Administered 2013-09-05: 4 mg via ORAL
  Filled 2013-08-31: qty 1

## 2013-08-31 MED ORDER — SODIUM CHLORIDE 0.9 % IV SOLN
INTRAVENOUS | Status: DC
  Administered 2013-08-31: via INTRAVENOUS

## 2013-08-31 MED ORDER — ONDANSETRON HCL 4 MG/2ML IJ SOLN
4.0000 mg | Freq: Four times a day (QID) | INTRAMUSCULAR | Status: DC | PRN
  Administered 2013-09-06: 4 mg via INTRAVENOUS
  Filled 2013-08-31: qty 2

## 2013-08-31 MED ORDER — CO-ENZYME Q-10 100 MG PO CAPS: 1.0000 | ORAL_CAPSULE | Freq: Every day | ORAL | Status: DC

## 2013-08-31 MED ORDER — RAMIPRIL 10 MG PO CAPS
10.0000 mg | ORAL_CAPSULE | Freq: Every day | ORAL | Status: DC
  Administered 2013-09-01 – 2013-09-06 (×6): 10 mg via ORAL
  Filled 2013-08-31 (×9): qty 1

## 2013-08-31 MED ORDER — CYCLOBENZAPRINE HCL 10 MG PO TABS
10.0000 mg | ORAL_TABLET | Freq: Two times a day (BID) | ORAL | Status: DC | PRN
  Administered 2013-09-03 – 2013-09-06 (×4): 10 mg via ORAL
  Filled 2013-08-31 (×4): qty 1

## 2013-08-31 MED ORDER — ACETAMINOPHEN 650 MG RE SUPP: 650.0000 mg | Freq: Four times a day (QID) | RECTAL | Status: DC | PRN

## 2013-08-31 MED ORDER — VITAMIN C 500 MG PO TABS
500.0000 mg | ORAL_TABLET | Freq: Two times a day (BID) | ORAL | Status: DC
  Administered 2013-09-01 – 2013-09-06 (×11): 500 mg via ORAL
  Filled 2013-08-31 (×13): qty 1

## 2013-08-31 MED ORDER — LORAZEPAM 2 MG/ML IJ SOLN
1.0000 mg | Freq: Once | INTRAMUSCULAR | Status: AC
Start: 2013-08-31 — End: 2013-08-31
  Administered 2013-08-31: 1 mg via INTRAVENOUS
  Filled 2013-08-31: qty 1

## 2013-08-31 NOTE — ED Notes (Signed)
After receiving valium and dilaudid to help patient with ct scan, pt became bradycardic and decreased oxygenation.  2L Oxygen [place] started, and ED provider notified and in room for assessment.

## 2013-08-31 NOTE — ED Notes (Signed)
Kristopher [name] (SO) and Vance Peper (Daughter) would like to be called if anything is going to be done on Pt. Tonight. [phone] Dorothy"s home # [phone] Ron"s cell phone#

## 2013-08-31 NOTE — ED Notes (Signed)
Reports doing heavy lifting and now having lower back pain that radiates down back of right leg since wed. Difficulty ambulating and bearing weight on right leg.

## 2013-08-31 NOTE — ED Notes (Signed)
Admit MD at bedside

## 2013-08-31 NOTE — H&P (Signed)
[facility] History and Physical  Kristopher [name] DVV:[id number] DOB: [birth year] DOA: 08/31/2013  Referring physician: ER physician. PCP: [name], MD   Chief Complaint: Low back pain.  History obtained from patient's friend as patient is sedated.  HPI: Kristopher [name] is a 80 y.o. male history of CAD status post stenting, peripheral arterial disease status post stenting, renal artery stenosis status post stenting, hypertension, COPD, abdominal aortic aneurysm was brought to the ER as patient had increasing low back pain over the last one week. As per patient's friend patient has had low back pain last Sunday week ago after he was trying to get into a truck. 3 days later his pain worsened after he carried heavy weight. Pain is mostly in his lower back radiating to the right ankle. Pain has become severe. In the ER patient was given multiple doses of pain and the medications for by sedation with Ativan. On my exam presently patient is sedated and only and contributes very less to the history. Patient did not have any fall or loss of consciousness as per patient's friend. Did not have any incontinence of urine or bowel. Did not have any chest pain shortness of breath nausea vomiting abdominal pain fever chills. On exam patient has good peripheral pulses. In the ER patient was found to have second-degree AV block.  Review of Systems: As presented in the history of presenting illness, rest negative.  Past Medical History  Diagnosis Date  . HTN (hypertension)   . CAD (coronary artery disease)     s/p PCI of LAD 1998;  s/p DES to RCA 05/2009;  Myoview 4/13: low risk, no ischemia, EF 56%  . Aortic stenosis     echo 4/12: normal LVF, mild LAE, no significant AS  . HLD (hyperlipidemia)   . PAD (peripheral artery disease)     s/p L CIA stent 09/2011 ([facility])  . Renal artery stenosis     s/p bilat RA stents  . Esophageal reflux   . DJD (degenerative joint disease), lumbar 07/06/2011  . Urine  incontinence   . Acute myocardial infarction, unspecified site, episode of care unspecified 1998  . COPD (chronic obstructive pulmonary disease)   . AAA (abdominal aortic aneurysm)     ultrasound 07/2011:  2.7 x 2.8 cm   Past Surgical History  Procedure Laterality Date  . Pci of lad  05/1996    STENT X 4 TOTAL  . Hemorrhoid surgery  1954  . Cataract extraction w/ intraocular lens  implant, bilateral  YRS AGO  . Vasectomy  1968  . Transurethral resection of prostate  08/11/2011    Procedure: TRANSURETHRAL RESECTION OF THE PROSTATE WITH GYRUS INSTRUMENTS;  Surgeon: Fredricka Bonine, MD;  Location: [facility];  Service: Urology;  Laterality: N/A;     . Coronary angioplasty with stent placement     Social History:  reports that he quit smoking about 25 years ago. He has never used smokeless tobacco. He reports that he does not drink alcohol or use illicit drugs. Where does patient live home. Can patient participate in ADLs? Yes.  Allergies  Allergen Reactions  . Influenza Vaccines Other (See Comments)    unknown  . Sulfa Antibiotics Rash    Family History:  Family History  Problem Relation Age of Onset  . Heart disease Mother   . Heart disease Father       Prior to Admission medications   Medication Sig Start Date End Date Taking? Authorizing Provider  Ascorbic  Acid (VITAMIN C) 500 MG tablet Take 500 mg by mouth 2 (two) times daily.    Yes Historical Provider, MD  aspirin 325 MG EC tablet Take 325 mg by mouth daily.   Yes Historical Provider, MD  atorvastatin (LIPITOR) 80 MG tablet Take 1 tablet (80 mg total) by mouth daily. 03/27/13  Yes Lelon Perla, MD  BEE POLLEN PO Take by mouth.   Yes Historical Provider, MD  benzonatate (TESSALON) 100 MG capsule 1-2 tab by mouth every 6 hrs as needed for cough 04/17/13  Yes [name], MD  Co-Enzyme Q-10 100 MG CAPS Take 1 capsule by mouth daily.   Yes Historical Provider, MD  cyclobenzaprine (FLEXERIL) 10 MG tablet Take 1 tablet (10  mg total) by mouth 2 (two) times daily as needed for muscle spasms. 01/16/13  Yes [name], MD  fish oil-omega-3 fatty acids 1000 MG capsule Take 1 g by mouth daily.    Yes Historical Provider, MD  HYDROcodone-acetaminophen (NORCO/VICODIN) 5-325 MG per tablet Take 1 tablet by mouth 2 (two) times daily as needed. To fill September 14, 2013 07/16/13  Yes [name], MD  Krill Oil 300 MG CAPS Take by mouth.   Yes Historical Provider, MD  Multiple Vitamin (MULTIVITAMIN) capsule Take 1 capsule by mouth daily.    Yes Historical Provider, MD  nitroGLYCERIN (NITROSTAT) 0.4 MG SL tablet Place 1 tablet (0.4 mg total) under the tongue every 5 (five) minutes as needed for chest pain. 12/19/12  Yes Lelon Perla, MD  omeprazole (PRILOSEC) 20 MG capsule Take 20 mg by mouth 2 (two) times daily before a meal. 2 tabs by mouth per day 04/01/13  Yes [name], MD  ramipril (ALTACE) 10 MG capsule Take 1 capsule (10 mg total) by mouth daily with breakfast. 12/19/12  Yes Lelon Perla, MD  vitamin E 400 UNIT capsule Take 400 Units by mouth daily.     Yes Historical Provider, MD    Physical Exam: Filed Vitals:   08/31/13 2015 08/31/13 2030 08/31/13 2045 08/31/13 2100  BP: 139/57 133/56 132/57 157/73  Pulse: 40 63 54 31  Temp:      TempSrc:      Resp: 12 15 10 16   Height:      Weight:      SpO2: 97% 96% 96% 96%     General:  Well-developed and moderately nourished.  Eyes: Anicteric no pallor.  ENT: No discharge from the ears eyes nose mouth.  Neck: No mass felt.  Cardiovascular: S1-S2 heard.  Respiratory: No rhonchi or crepitations.  Abdomen: Soft nontender bowel sounds present. No guarding or rigidity.  Skin: Has mild erythema in the toes.  Musculoskeletal: No localized effusion or erythema or ecchymosis. On passive motion there is no pain. Has chronic skin changes in the lower extremities. Patient has good pulses.  Psychiatric: Patient is lethargic and sedated.  Neurologic: Patient is  lethargic and sedated.  Labs on Admission:  Basic Metabolic Panel:  Recent Labs Lab 08/31/13 1645  NA 142  K 4.4  CL 106  CO2 24  GLUCOSE 96  BUN 24*  CREATININE 1.01  CALCIUM 9.5   Liver Function Tests: No results found for this basename: AST, ALT, ALKPHOS, BILITOT, PROT, ALBUMIN,  in the last 168 hours No results found for this basename: LIPASE, AMYLASE,  in the last 168 hours No results found for this basename: AMMONIA,  in the last 168 hours CBC:  Recent Labs Lab 08/31/13  1645  WBC 8.1  NEUTROABS 4.7  HGB 12.3*  HCT 37.4*  MCV 90.1  PLT 229   Cardiac Enzymes: No results found for this basename: CKTOTAL, CKMB, CKMBINDEX, TROPONINI,  in the last 168 hours  BNP (last 3 results) No results found for this basename: PROBNP,  in the last 8760 hours CBG: No results found for this basename: GLUCAP,  in the last 168 hours  Radiological Exams on Admission: No results found.  EKG: Independently reviewed. Second degree AV block2 mobitz.  Assessment/Plan Principal Problem:   Low back pain Active Problems:   HYPERTENSION   CAD   COPD (chronic obstructive pulmonary disease)   AV block, 2nd degree   1. Intractable low back pain - given the patient's radiation of pain I suspect patient's pain is most likely radiculopathy. X-rays are pending of the pelvis lumbar spine T-spine and knee. MRI L-spine has been ordered. Since patient also has abdominal aortic aneurysm CT abdomen and pelvis has been ordered. Continue with pain relief medications. 2. Second degree AV block - cardiology has been consulted. For now we will observe in step down. Patient is not on any rate limiting medications. 3. CAD - as per patient's friend patient did not have any chest pain. 4. COPD - presently not wheezing. 5. Hypertension. 6. History of renal artery stenosis. 7. Hyperlipidemia - continue statins.    Code Status: Full code.  Family Communication: Patient's friend with whom patient  lives.  Disposition Plan: Admit to inpatient.    [facility] Pager [phone].  If 7PM-7AM, please contact night-coverage [email or web address] Password [facility] 08/31/2013, 9:21 PM

## 2013-08-31 NOTE — ED Provider Notes (Signed)
CSN: [id number]     Arrival date & time 08/31/13  1323 History   First MD Initiated Contact with Patient 08/31/13 1457     Chief Complaint  Patient presents with  . Back Pain     (Consider location/radiation/quality/duration/timing/severity/associated sxs/prior Treatment) HPI Comments: Patient is an 80 year old male past medical history significant for hypertension, CAD, aortic stenosis, hyperlipidemia renal artery stenosis, history of MI, COPD, abdominal aortic aneurysm presenting to the emergency department for low back pain with radiation down the right leg that began Sunday. Patient states that he twisted probably getting into his car on Sunday and felt to nonradiating low back pain describes as a spasm. Patient states that seem to be getting better on Wednesday was doing heavy lifting and the pain recurred more severely with radiation down right leg. He states constant burning pain in sharp spasms. Since he is unable to straighten his back out due to pain. He is having difficulty ambulating and bearing weight on his right leg. He has tried his at home Norco and Flexeril w/o improvement. Patient also noted his right foot is discolored and cold compared to his left He denies any falls, fevers, chills, chest pain, shortness of breath, numbness or weakness, bladder or bowel incontinence, urinary symptoms, history of cancer or IVDA. No history of back surgeries.   Patient is a 80 y.o. male presenting with back pain.  Back Pain Associated symptoms: no fever, no numbness and no weakness     Past Medical History  Diagnosis Date  . HTN (hypertension)   . CAD (coronary artery disease)     s/p PCI of LAD 1998;  s/p DES to RCA 05/2009;  Myoview 4/13: low risk, no ischemia, EF 56%  . Aortic stenosis     echo 4/12: normal LVF, mild LAE, no significant AS  . HLD (hyperlipidemia)   . PAD (peripheral artery disease)     s/p L CIA stent 09/2011 ([facility])  . Renal artery stenosis     s/p bilat RA stents    . Esophageal reflux   . DJD (degenerative joint disease), lumbar 07/06/2011  . Urine incontinence   . Acute myocardial infarction, unspecified site, episode of care unspecified 1998  . COPD (chronic obstructive pulmonary disease)   . AAA (abdominal aortic aneurysm)     ultrasound 07/2011:  2.7 x 2.8 cm   Past Surgical History  Procedure Laterality Date  . Pci of lad  05/1996    STENT X 4 TOTAL  . Hemorrhoid surgery  1954  . Cataract extraction w/ intraocular lens  implant, bilateral  YRS AGO  . Vasectomy  1968  . Transurethral resection of prostate  08/11/2011    Procedure: TRANSURETHRAL RESECTION OF THE PROSTATE WITH GYRUS INSTRUMENTS;  Surgeon: Fredricka Bonine, MD;  Location: [facility];  Service: Urology;  Laterality: N/A;     . Coronary angioplasty with stent placement     Family History  Problem Relation Age of Onset  . Heart disease Mother   . Heart disease Father    History  Substance Use Topics  . Smoking status: Former Smoker -- 3.00 packs/day for 38 years    Quit date: 04/10/1988  . Smokeless tobacco: Never Used  . Alcohol Use: No    Review of Systems  Constitutional: Negative for fever and chills.  Musculoskeletal: Positive for back pain and myalgias.  Skin: Positive for color change.  Neurological: Negative for weakness and numbness.  All other systems reviewed and are negative.  Allergies  Influenza vaccines and Sulfa antibiotics  Home Medications   Prior to Admission medications   Medication Sig Start Date End Date Taking? Authorizing Provider  Ascorbic Acid (VITAMIN C) 500 MG tablet Take 500 mg by mouth 2 (two) times daily.    Yes Historical Provider, MD  aspirin 325 MG EC tablet Take 325 mg by mouth daily.   Yes Historical Provider, MD  atorvastatin (LIPITOR) 80 MG tablet Take 1 tablet (80 mg total) by mouth daily. 03/27/13  Yes Lelon Perla, MD  BEE POLLEN PO Take by mouth.   Yes Historical Provider, MD  benzonatate (TESSALON) 100 MG  capsule 1-2 tab by mouth every 6 hrs as needed for cough 04/17/13  Yes [name], MD  Co-Enzyme Q-10 100 MG CAPS Take 1 capsule by mouth daily.   Yes Historical Provider, MD  cyclobenzaprine (FLEXERIL) 10 MG tablet Take 1 tablet (10 mg total) by mouth 2 (two) times daily as needed for muscle spasms. 01/16/13  Yes [name], MD  fish oil-omega-3 fatty acids 1000 MG capsule Take 1 g by mouth daily.    Yes Historical Provider, MD  HYDROcodone-acetaminophen (NORCO/VICODIN) 5-325 MG per tablet Take 1 tablet by mouth 2 (two) times daily as needed. To fill September 14, 2013 07/16/13  Yes [name], MD  Krill Oil 300 MG CAPS Take by mouth.   Yes Historical Provider, MD  Multiple Vitamin (MULTIVITAMIN) capsule Take 1 capsule by mouth daily.    Yes Historical Provider, MD  nitroGLYCERIN (NITROSTAT) 0.4 MG SL tablet Place 1 tablet (0.4 mg total) under the tongue every 5 (five) minutes as needed for chest pain. 12/19/12  Yes Lelon Perla, MD  omeprazole (PRILOSEC) 20 MG capsule Take 20 mg by mouth 2 (two) times daily before a meal. 2 tabs by mouth per day 04/01/13  Yes [name], MD  ramipril (ALTACE) 10 MG capsule Take 1 capsule (10 mg total) by mouth daily with breakfast. 12/19/12  Yes Lelon Perla, MD  vitamin E 400 UNIT capsule Take 400 Units by mouth daily.     Yes Historical Provider, MD   BP 139/57  Pulse 40  Temp(Src) 98.3 F (36.8 C) (Oral)  Resp 12  Ht 5\' 9"  (1.753 m)  Wt 150 lb (68.04 kg)  BMI 22.14 kg/m2  SpO2 97% Physical Exam  Nursing note and vitals reviewed. Constitutional: He is oriented to person, place, and time. He appears well-developed and well-nourished. No distress.  HENT:  Head: Normocephalic and atraumatic.  Right Ear: External ear normal.  Left Ear: External ear normal.  Nose: Nose normal.  Mouth/Throat: Oropharynx is clear and moist.  Eyes: Conjunctivae and EOM are normal. Pupils are equal, round, and reactive to light.  Neck: Normal range of motion. Neck  supple.  Cardiovascular: Normal rate, regular rhythm, normal heart sounds and intact distal pulses.   Pulses:      Dorsalis pedis pulses are 2+ on the right side, and 2+ on the left side.       Posterior tibial pulses are 2+ on the right side, and 2+ on the left side.  Pulmonary/Chest: Effort normal and breath sounds normal. No respiratory distress.  Abdominal: Soft.  Musculoskeletal: He exhibits no edema.       Right hip: He exhibits decreased range of motion and decreased strength. He exhibits no tenderness, no bony tenderness and no deformity.       Left hip: Normal.  Right knee: He exhibits decreased range of motion. He exhibits no swelling, no effusion, no ecchymosis and no deformity. Tenderness found.       Left knee: Normal.       Right ankle: He exhibits normal range of motion and no swelling. No tenderness.       Left ankle: Normal.       Cervical back: Normal.       Thoracic back: Normal.       Lumbar back: He exhibits decreased range of motion, tenderness, bony tenderness and pain. He exhibits no swelling, no edema, no deformity and no spasm.       Legs:      Right foot: He exhibits normal range of motion, no tenderness, no bony tenderness and normal capillary refill.       Left foot: Normal.  Neurological: He is alert and oriented to person, place, and time.  Skin: Skin is warm and dry. He is not diaphoretic.     Psychiatric: He has a normal mood and affect.    ED Course  Procedures (including critical care time) Medications  fentaNYL (SUBLIMAZE) injection 100 mcg (100 mcg Intravenous Given 08/31/13 1646)  HYDROmorphone (DILAUDID) injection 1 mg (1 mg Intravenous Given 08/31/13 1726)  diazepam (VALIUM) injection 2.5 mg (2.5 mg Intravenous Given 08/31/13 1822)  HYDROmorphone (DILAUDID) injection 1 mg (1 mg Intravenous Given 08/31/13 1822)  ketorolac (TORADOL) 30 MG/ML injection 15 mg (15 mg Intravenous Given 08/31/13 1918)  dexamethasone (DECADRON) injection 10 mg (10 mg  Intravenous Given 08/31/13 1918)  LORazepam (ATIVAN) injection 1 mg (1 mg Intravenous Given 08/31/13 1958)    Labs Review Labs Reviewed  BASIC METABOLIC PANEL - Abnormal; Notable for the following:    BUN 24 (*)    GFR calc non Af Amer 68 (*)    GFR calc Af Amer 79 (*)    All other components within normal limits  CBC WITH DIFFERENTIAL - Abnormal; Notable for the following:    RBC 4.15 (*)    Hemoglobin 12.3 (*)    HCT 37.4 (*)    All other components within normal limits  PROTIME-INR  LACTIC ACID, PLASMA  APTT    Imaging Review No results found.   EKG Interpretation None      MDM   Final diagnoses:  Back pain    Filed Vitals:   08/31/13 2015  BP: 139/57  Pulse: 40  Temp:   Resp: 12   Afebrile, NAD, non-toxic appearing, AAOx4. Intact distal pulses. LS TTP. No obvious deformity. No gross sensory deficit.   Labs reviewed.  On re-evaluation, patient's right foot is now pink, with continued good DP and PT pulses and warm to the touch. Discoloration is likely d/t position. Since patient is having difficulty laying flat, plus improvement of foot discoloration will d/c CT angio and order MR of lumbar spine for disk evaluation.   Patient was still in considerable pain after dilaudid prior to going to CT scan, another dose of Dilaudid along with Valium given. Patient became drowsy and developed bradycardia and bradypnea. Placed on oxygen (2L) with good response. He was with good strong pulse and even respirations.  Patient's heart rate improved. With time patient became AAOx4.   Patient was given Ativan, Toradol and Decadron again with no improvement of symptoms. He is unable to lay down for MRI. Patient will likely require procedural sedation he to obtain further imaging. He'll be admitted to the hospital for further evaluation, pain management and eventual  imaging evaluation. At that time and the appropriate consultation can be made regarding his back pain. Patient d/w with  Dr. Wilson Singer, agrees with plan.    Harlow Mares, PA-C 08/31/13 2033

## 2013-09-01 ENCOUNTER — Observation Stay (HOSPITAL_COMMUNITY): Payer: Medicare Other

## 2013-09-01 DIAGNOSIS — M549 Dorsalgia, unspecified: Secondary | ICD-10-CM | POA: Diagnosis not present

## 2013-09-01 DIAGNOSIS — I441 Atrioventricular block, second degree: Secondary | ICD-10-CM | POA: Diagnosis not present

## 2013-09-01 DIAGNOSIS — I77811 Abdominal aortic ectasia: Secondary | ICD-10-CM | POA: Diagnosis not present

## 2013-09-01 LAB — CBC WITH DIFFERENTIAL/PLATELET
Basophils Absolute: 0 10*3/uL (ref 0.0–0.1)
Basophils Relative: 0 % (ref 0–1)
Eosinophils Absolute: 0 10*3/uL (ref 0.0–0.7)
Eosinophils Relative: 0 % (ref 0–5)
HCT: 39.9 % (ref 39.0–52.0)
Hemoglobin: 13.3 g/dL (ref 13.0–17.0)
Lymphocytes Relative: 13 % (ref 12–46)
Lymphs Abs: 0.6 10*3/uL — ABNORMAL LOW (ref 0.7–4.0)
MCH: 30.5 pg (ref 26.0–34.0)
MCHC: 33.3 g/dL (ref 30.0–36.0)
MCV: 91.5 fL (ref 78.0–100.0)
Monocytes Absolute: 0 10*3/uL — ABNORMAL LOW (ref 0.1–1.0)
Monocytes Relative: 1 % — ABNORMAL LOW (ref 3–12)
Neutro Abs: 4.1 10*3/uL (ref 1.7–7.7)
Neutrophils Relative %: 86 % — ABNORMAL HIGH (ref 43–77)
Platelets: 222 10*3/uL (ref 150–400)
RBC: 4.36 MIL/uL (ref 4.22–5.81)
RDW: 15.2 % (ref 11.5–15.5)
WBC: 4.8 10*3/uL (ref 4.0–10.5)

## 2013-09-01 LAB — COMPREHENSIVE METABOLIC PANEL
ALT: 21 U/L (ref 0–53)
AST: 25 U/L (ref 0–37)
Albumin: 3.4 g/dL — ABNORMAL LOW (ref 3.5–5.2)
Alkaline Phosphatase: 83 U/L (ref 39–117)
BUN: 27 mg/dL — ABNORMAL HIGH (ref 6–23)
CO2: 22 mEq/L (ref 19–32)
Calcium: 9 mg/dL (ref 8.4–10.5)
Chloride: 106 mEq/L (ref 96–112)
Creatinine, Ser: 1.13 mg/dL (ref 0.50–1.35)
GFR calc Af Amer: 69 mL/min — ABNORMAL LOW (ref >90)
GFR calc non Af Amer: 59 mL/min — ABNORMAL LOW (ref >90)
Glucose, Bld: 158 mg/dL — ABNORMAL HIGH (ref 70–99)
Potassium: 4.6 mEq/L (ref 3.7–5.3)
Sodium: 141 mEq/L (ref 137–147)
Total Bilirubin: 0.4 mg/dL (ref 0.3–1.2)
Total Protein: 6.3 g/dL (ref 6.0–8.3)

## 2013-09-01 LAB — MAGNESIUM: Magnesium: 1.9 mg/dL (ref 1.5–2.5)

## 2013-09-01 LAB — MRSA PCR SCREENING: MRSA by PCR: NEGATIVE

## 2013-09-01 LAB — GLUCOSE, CAPILLARY
Glucose-Capillary: 96 mg/dL (ref 70–99)
Glucose-Capillary: 113 mg/dL — ABNORMAL HIGH (ref 70–99)

## 2013-09-01 LAB — TSH: TSH: 1.66 u[IU]/mL (ref 0.350–4.500)

## 2013-09-01 LAB — TROPONIN I: Troponin I: <0.3 ng/mL (ref <0.30)

## 2013-09-01 MED ORDER — HEPARIN SODIUM (PORCINE) 5000 UNIT/ML IJ SOLN
5000.0000 [IU] | Freq: Three times a day (TID) | INTRAMUSCULAR | Status: DC
  Administered 2013-09-01 – 2013-09-06 (×14): 5000 [IU] via SUBCUTANEOUS
  Filled 2013-09-01 (×19): qty 1

## 2013-09-01 MED ORDER — OXYCODONE-ACETAMINOPHEN 5-325 MG PO TABS
2.0000 | ORAL_TABLET | Freq: Once | ORAL | Status: AC
  Administered 2013-09-01: 2 via ORAL
  Filled 2013-09-01: qty 2

## 2013-09-01 MED ORDER — AMLODIPINE BESYLATE 10 MG PO TABS
10.0000 mg | ORAL_TABLET | Freq: Every day | ORAL | Status: DC
  Administered 2013-09-01 – 2013-09-06 (×6): 10 mg via ORAL
  Filled 2013-09-01 (×7): qty 1

## 2013-09-01 MED ORDER — HYDROCODONE-ACETAMINOPHEN 5-325 MG PO TABS
1.0000 | ORAL_TABLET | Freq: Two times a day (BID) | ORAL | Status: DC | PRN
  Administered 2013-09-01: 1 via ORAL
  Filled 2013-09-01: qty 2

## 2013-09-01 MED ORDER — IOHEXOL 350 MG/ML SOLN
80.0000 mL | Freq: Once | INTRAVENOUS | Status: AC | PRN
  Administered 2013-09-01: 80 mL via INTRAVENOUS

## 2013-09-01 NOTE — Progress Notes (Addendum)
TEAM 1 - Stepdown/ICU TEAM Progress Note  Kristopher [name] [id number] DOB: Nov 12, 1930 DOA: 08/31/2013 PCP: [name], MD  Admit HPI / Brief Narrative: 80 y.o. male w/ a history of CAD status post stenting, peripheral arterial disease status post stenting, renal artery stenosis status post stenting, hypertension, COPD, and abdominal aortic aneurysm who was brought to the ER with increasing low back pain over one week. Had developed low back pain 7 days prior after he was trying to get into a truck. 3 days later his pain worsened after he carried heavy weight. Pain mostly in lower back radiating to the right ankle.  In the ER the patient was given multiple doses of pain medications and Ativan. Patient did not have any incontinence of urine or bowel. He was incidentally found to have second-degree AV block.  HPI/Subjective: Pt now vehemently denies having any back pain.  He states his pain is associated with the R knee, with shooting pains spreading down in to the foot and up into the hip.  He also says his 3rd-5th toes "turned black" when the pain started, but have since returned to their normal color.  He denies cp, sob, f/c n/v, or abdom pain.    Assessment/Plan:  Severe R knee pain - ?back pain  Xrays of pelvis, thoracic spine, R knee, and lumbar spine are all w/o acute findings - will check MRI of knee to r/o cartilage/ligament injury - no clear effusion on exam - no clinical findings to suggest septic arthritis - PT/OT to begin after MRI if appropriate - pt has 2+ DP and TP pulses, and no exam findings to suggest vascular issue to explain reported toe discoloration   Bradycardia - Second degree AV block  As per Cardiology - felt to be due to IV narcotics - follow on tele w/ use of mild oral narcotic (which patient has taken BID prn at home x 1year)  HTN Poorly controlled - likely exacerbated by pain - adjust tx and follow   Vague nonspecific 1.5 cm focus of contrast blush at  the medial right hepatic lobe An incidental finding - per Radiolgoy "Dynamic liver protocol MRI or CT could be considered for further evaluation, when the patient is able to hold his breath for the study."  CAD s/p PCI w/ stents x4 1998 Asymptomatic   AoS  Vascular disease  DJD lumbar spine  COPD Well compensated  Reported hx of AAA CTA of abdom states "no evidence of AAA"  Code Status: FULL Family Communication: no family present at time of exam Disposition Plan: transfer to tele bed - possible home tomorrow to complete outpt knee pain w/u if no more brady on tele   Consultants: Cardiology [facility])  Procedures: None  Antibiotics: none  DVT prophylaxis: SQ heparin  Objective: Blood pressure 172/100, pulse 97, temperature 97 F (36.1 C), temperature source Oral, resp. rate 19, height 5\' 9"  (1.753 m), weight 68.04 kg (150 lb), SpO2 94.00%.  Intake/Output Summary (Last 24 hours) at 09/01/13 1058 Last data filed at 09/01/13 0900  Gross per 24 hour  Intake      0 ml  Output    800 ml  Net   -800 ml   Exam: General: No acute respiratory distress Lungs: Clear to auscultation bilaterally without wheezes or crackles Cardiovascular: Regular rate and rhythm without gallop or rub  Abdomen: Nontender, nondistended, soft, bowel sounds positive, no rebound, no ascites, no appreciable mass Extremities: No significant cyanosis, clubbing, or edema bilateral lower  extremities - pain to palpation of R knee but no appreciable effusion or erythema - pain w/ passive or active ROM - ~1/2 inch diameter fatty type subcutaneous deposit to medial aspect of R knee   Data Reviewed: Basic Metabolic Panel:  Recent Labs Lab 08/31/13 1645 09/01/13 0230  NA 142 141  K 4.4 4.6  CL 106 106  CO2 24 22  GLUCOSE 96 158*  BUN 24* 27*  CREATININE 1.01 1.13  CALCIUM 9.5 9.0  MG  --  1.9   Liver Function Tests:  Recent Labs Lab 09/01/13 0230  AST 25  ALT 21  ALKPHOS 83  BILITOT 0.4   PROT 6.3  ALBUMIN 3.4*   CBC:  Recent Labs Lab 08/31/13 1645 09/01/13 0230  WBC 8.1 4.8  NEUTROABS 4.7 4.1  HGB 12.3* 13.3  HCT 37.4* 39.9  MCV 90.1 91.5  PLT 229 222   Cardiac Enzymes:  Recent Labs Lab 09/01/13 0230  TROPONINI <0.30    Recent Results (from the past 240 hour(s))  MRSA PCR SCREENING     Status: None   Collection Time    09/01/13  4:38 AM      Result Value Ref Range Status   MRSA by PCR NEGATIVE  NEGATIVE Final   Comment:            The GeneXpert MRSA Assay (FDA     approved for NASAL specimens     only), is one component of a     comprehensive MRSA colonization     surveillance program. It is not     intended to diagnose MRSA     infection nor to guide or     monitor treatment for     MRSA infections.     Studies:  Recent x-ray studies have been reviewed in detail by the Attending Physician  Scheduled Meds:  Scheduled Meds: . aspirin  325 mg Oral Daily  . atorvastatin  80 mg Oral Daily  . multivitamin with minerals  1 tablet Oral Daily  . omega-3 acid ethyl esters  1 g Oral Daily  . pantoprazole  40 mg Oral Daily  . ramipril  10 mg Oral Q breakfast  . vitamin C  500 mg Oral BID    Time spent on care of this patient: 35 mins  Cherene Altes , MD   [facility] Office  [phone] Pager - Text Page per Amion as per below:  On-Call/Text Page:      [email or web address]      password [email or web address]  If 7PM-7AM, please contact night-coverage [email or web address] Password [email or web address] 09/01/2013, 10:58 AM   LOS: 1 day

## 2013-09-01 NOTE — Progress Notes (Signed)
    Subjective:  Denies CP or dyspnea; no syncope; complains of right knee pain   Objective:  Filed Vitals:   09/01/13 0200 09/01/13 0300 09/01/13 0400 09/01/13 0500  BP: 176/48 139/72 128/76 141/71  Pulse:  108 102 94  Temp:   98.2 F (36.8 C)   TempSrc:   Oral   Resp: 22 10 15 17   Height:      Weight:      SpO2:  95% 97% 96%    Intake/Output from previous day: No intake or output data in the 24 hours ending 09/01/13 0750  Physical Exam: Physical exam: Well-developed well-nourished in no acute distress.  Skin is warm and dry.  HEENT is normal.  Neck is supple.  Chest is clear to auscultation with normal expansion.  Cardiovascular exam is regular rate and rhythm.  Abdominal exam nontender or distended. No masses palpated. Extremities show no edema. 2+ right PT neuro grossly intact    Lab Results: Basic Metabolic Panel:  Recent Labs  08/31/13 1645 09/01/13 0230  NA 142 141  K 4.4 4.6  CL 106 106  CO2 24 22  GLUCOSE 96 158*  BUN 24* 27*  CREATININE 1.01 1.13  CALCIUM 9.5 9.0  MG  --  1.9   CBC:  Recent Labs  08/31/13 1645 09/01/13 0230  WBC 8.1 4.8  NEUTROABS 4.7 4.1  HGB 12.3* 13.3  HCT 37.4* 39.9  MCV 90.1 91.5  PLT 229 222   Cardiac Enzymes:  Recent Labs  09/01/13 0230  TROPONINI <0.30     Assessment/Plan:  1 Bradycardia-now in sinus; telemetry reviewed; Mobitz 1 and 2:1 AV block now resolved; continue off AV nodal blocking agents. 2 Back pain- sounds muskuloskeletal; management per primary care 3 CAD-continue ASA and statin. 4 Hypertension-continue altace   Lelon Perla 09/01/2013, 7:50 AM

## 2013-09-01 NOTE — Progress Notes (Signed)
Report called to Jeneen Rinks on 2W. Will transfer after pt finishes eating. Susie Cassette RN

## 2013-09-01 NOTE — Progress Notes (Signed)
Cardiology Consultation Note  Patient ID: Kristopher [name], MRN: [id number], DOB/AGE: 1931-02-24 80 y.o. Admit date: 08/31/2013   Date of Consult: 09/01/2013 Primary Physician: Oliver Barre, MD Primary Cardiologist: Olga Millers  Chief Complaint: back pain  Reason for Consult: heart block    80 yr old male with hx of CAD s/p PCI of LAD in 59 and RCA in 2011, myoview in 2011 with no ischemia, nl EF, PAD ( bilateral renal stent , left CIA stenting ) admitted with back pain and being seen for heart block   HPI: pt states that he has right leg pain that brought him into the hospital.  He describes soreness / swelling in his right thigh &  knee and discoloration of his right toes that has been ongoing for a while but worse these past few day .  While being monitored on Telemetry ( when he was sleeping and s/p pain medication )  pt  was noted to have intermittent second degree 3:1 AV block  .  Pt has chronic mild orthostatic symptoms but otherwise denies any dizziness, pre-syncope, syncope symptoms. He reports chronic angina ( brought predictably if he exerts himself heavily while doing yard work) that is stable in intensity , frequency and duration. Pt denies any SOB , orthopnea, PND ,,claudcation , focal weakness, or bleeding diathesis .    Past Medical History  Diagnosis Date  . HTN (hypertension)   . CAD (coronary artery disease)     s/p PCI of LAD 1998;  s/p DES to RCA 05/2009;  Myoview 4/13: low risk, no ischemia, EF 56%  . Aortic stenosis     echo 4/12: normal LVF, mild LAE, no significant AS  . HLD (hyperlipidemia)   . PAD (peripheral artery disease)     s/p L CIA stent 09/2011 ([facility])  . Renal artery stenosis     s/p bilat RA stents  . Esophageal reflux   . DJD (degenerative joint disease), lumbar 07/06/2011  . Urine incontinence   . Acute myocardial infarction, unspecified site, episode of care unspecified 1998  . COPD (chronic obstructive pulmonary disease)   . AAA (abdominal aortic  aneurysm)     ultrasound 07/2011:  2.7 x 2.8 cm      Most Recent Cardiac Studies: Echo 07/2010  Left ventricle: The cavity size was normal. Wall thickness was normal. Systolic function was normal. The estimated ejection fraction was in the range of 55% to 60%. Wall motion was normal; there were no regional wall motion abnormalities. Doppler parameters are consistent with abnormal left ventricular relaxation (grade 1 diastolic dysfunction). - Left atrium: The atrium was mildly dilated.  EKG ; NSR, second degree 3:1 AV block( likely  Mobitz I )  , RBBB    Surgical History:  Past Surgical History  Procedure Laterality Date  . Pci of lad  05/1996    STENT X 4 TOTAL  . Hemorrhoid surgery  1954  . Cataract extraction w/ intraocular lens  implant, bilateral  YRS AGO  . Vasectomy  1968  . Transurethral resection of prostate  08/11/2011    Procedure: TRANSURETHRAL RESECTION OF THE PROSTATE WITH GYRUS INSTRUMENTS;  Surgeon: Antony Haste, MD;  Location: [facility];  Service: Urology;  Laterality: N/A;     . Coronary angioplasty with stent placement       Home Meds: Prior to Admission medications   Medication Sig Start Date End Date Taking? Authorizing Provider  Ascorbic Acid (VITAMIN C) 500 MG tablet Take 500 mg  by mouth 2 (two) times daily.    Yes Historical Provider, MD  aspirin 325 MG EC tablet Take 325 mg by mouth daily.   Yes Historical Provider, MD  atorvastatin (LIPITOR) 80 MG tablet Take 1 tablet (80 mg total) by mouth daily. 03/27/13  Yes Lelon Perla, MD  BEE POLLEN PO Take by mouth.   Yes Historical Provider, MD  benzonatate (TESSALON) 100 MG capsule 1-2 tab by mouth every 6 hrs as needed for cough 04/17/13  Yes [name], MD  Co-Enzyme Q-10 100 MG CAPS Take 1 capsule by mouth daily.   Yes Historical Provider, MD  cyclobenzaprine (FLEXERIL) 10 MG tablet Take 1 tablet (10 mg total) by mouth 2 (two) times daily as needed for muscle spasms. 01/16/13  Yes [name], MD    fish oil-omega-3 fatty acids 1000 MG capsule Take 1 g by mouth daily.    Yes Historical Provider, MD  HYDROcodone-acetaminophen (NORCO/VICODIN) 5-325 MG per tablet Take 1 tablet by mouth 2 (two) times daily as needed. To fill September 14, 2013 07/16/13  Yes [name], MD  Krill Oil 300 MG CAPS Take by mouth.   Yes Historical Provider, MD  Multiple Vitamin (MULTIVITAMIN) capsule Take 1 capsule by mouth daily.    Yes Historical Provider, MD  nitroGLYCERIN (NITROSTAT) 0.4 MG SL tablet Place 1 tablet (0.4 mg total) under the tongue every 5 (five) minutes as needed for chest pain. 12/19/12  Yes Lelon Perla, MD  omeprazole (PRILOSEC) 20 MG capsule Take 20 mg by mouth 2 (two) times daily before a meal. 2 tabs by mouth per day 04/01/13  Yes [name], MD  ramipril (ALTACE) 10 MG capsule Take 1 capsule (10 mg total) by mouth daily with breakfast. 12/19/12  Yes Lelon Perla, MD  vitamin E 400 UNIT capsule Take 400 Units by mouth daily.     Yes Historical Provider, MD    Inpatient Medications:  . aspirin  325 mg Oral Daily  . atorvastatin  80 mg Oral Daily  . multivitamin with minerals  1 tablet Oral Daily  . omega-3 acid ethyl esters  1 g Oral Daily  . pantoprazole  40 mg Oral Daily  . ramipril  10 mg Oral Q breakfast  . vitamin C  500 mg Oral BID   . sodium chloride      Allergies:  Allergies  Allergen Reactions  . Influenza Vaccines Other (See Comments)    unknown  . Sulfa Antibiotics Rash    History   Social History  . Marital Status: Widowed    Spouse Name: N/A    Number of Children: N/A  . Years of Education: GED   Occupational History  . retired    Social History Main Topics  . Smoking status: Former Smoker -- 3.00 packs/day for 38 years    Quit date: 04/10/1988  . Smokeless tobacco: Never Used  . Alcohol Use: No  . Drug Use: No  . Sexual Activity: Not on file   Other Topics Concern  . Not on file   Social History Narrative  . No narrative on file      Family History  Problem Relation Age of Onset  . Heart disease Mother   . Heart disease Father      Review of Systems  General: negative for chills, fever, night sweats or weight changes.  Cardiovascular: per HPI  Dermatological: negative for rash Respiratory: negative for cough or wheezing Urologic: negative for  hematuria Abdominal: negative for nausea, vomiting, diarrhea, bright red blood per rectum, melena, or hematemesis Neurologic: negative for visual changes, syncope, or dizziness All other systems reviewed and are otherwise negative except as noted above.  Labs: No results found for this basename: CKTOTAL, CKMB, TROPONINI,  in the last 72 hours Lab Results  Component Value Date   WBC 8.1 08/31/2013   HGB 12.3* 08/31/2013   HCT 37.4* 08/31/2013   MCV 90.1 08/31/2013   PLT 229 08/31/2013    Recent Labs Lab 08/31/13 1645  NA 142  K 4.4  CL 106  CO2 24  BUN 24*  CREATININE 1.01  CALCIUM 9.5  GLUCOSE 96   Lab Results  Component Value Date   CHOL 129 07/16/2013   HDL 45.10 07/16/2013   LDLCALC 67 07/16/2013   TRIG 86.0 07/16/2013   No results found for this basename: DDIMER    Radiology/Studies:  Dg Thoracic Spine 2 View  08/31/2013   CLINICAL DATA:  Thoracic pain for 5 days question twist injury  EXAM: THORACIC SPINE - 2 VIEW  COMPARISON:  Chest radiographs 08/09/2011  FINDINGS: Diffuse osseous demineralization.  12 pairs of ribs.  Vertebral body and disk space heights maintained.  No definite fracture, subluxation or bone destruction.  Visualized posterior ribs intact.  Streaky atelectasis medial LEFT lower lobe.  IMPRESSION: Osseous demineralization.  No definite acute thoracic spine abnormalities.   Electronically Signed   By: Lavonia Dana M.D.   On: 08/31/2013 23:00   Dg Lumbar Spine 2-3 Views  08/31/2013   CLINICAL DATA:  Low back pain for 5 days, question twist injury  EXAM: LUMBAR SPINE - 2-3 VIEW  COMPARISON:  None  FINDINGS: Five non-rib-bearing lumbar vertebrae.   Diffuse osseous demineralization.  Vertebral body and disk space heights maintained.  No acute fracture, subluxation or bone destruction.  Atherosclerotic calcifications aorta with LEFT iliac stent noted.  SI joints symmetric.  Numerous EKG leads project over abdomen.  IMPRESSION: Osseous demineralization.  No acute bony findings.   Electronically Signed   By: Lavonia Dana M.D.   On: 08/31/2013 22:57   Dg Pelvis 1-2 Views  08/31/2013   CLINICAL DATA:  RIGHT hip pain for 5 days  EXAM: PELVIS - 1-2 VIEW  COMPARISON:  None  FINDINGS: Osseous demineralization.  Hip and SI joint spaces symmetric and preserved.  No acute fracture, dislocation or bone destruction.  LEFT pelvic phleboliths and LEFT iliac stent noted.  IMPRESSION: Osseous demineralization.  No acute bony findings.   Electronically Signed   By: Lavonia Dana M.D.   On: 08/31/2013 23:00   Dg Knee Complete 4 Views Right  08/31/2013   CLINICAL DATA:  RIGHT anterior knee pain for 5 days  EXAM: RIGHT KNEE - COMPLETE 4+ VIEW  COMPARISON:  None  FINDINGS: Osseous demineralization.  Minimal medial compartment joint space narrowing.  No acute fracture, dislocation, or bone destruction.  No knee joint effusion.  Small patellar spur at quadriceps tendon insertion.  Scattered atherosclerotic calcification at popliteal artery.  IMPRESSION: Osseous demineralization with minimal degenerative changes.  No acute abnormalities.   Electronically Signed   By: Lavonia Dana M.D.   On: 08/31/2013 22:58    EKG: see above   Physical Exam: Blood pressure 126/42, pulse 40, temperature 98.3 F (36.8 C), temperature source Oral, resp. rate 15, height 5\' 9"  (1.753 m), weight 68.04 kg (150 lb), SpO2 94.00%. General: thin body habitus  Head: Normocephalic, atraumatic, sclera non-icteric, no xanthomas, nares are without discharge.  Neck: Negative for carotid bruits. JVD not elevated. Lungs: Clear bilaterally to auscultation without wheezes, rales, or rhonchi. Breathing is  unlabored. Heart: RRR with S1 S2. No murmurs, rubs, or gallops appreciated. Abdomen: Soft, non-tender, non-distended with normoactive bowel sounds. No hepatomegaly. No rebound/guarding. No obvious abdominal masses. Msk:  Strength and tone appear normal for age. Extremities: No clubbing or cyanosis. No edema.  Distal pedal pulses are 1+ and equal bilaterally. Neuro: Alert and oriented X 3. No facial asymmetry. No focal deficit. Moves all extremities spontaneously. Psych:  Responds to questions appropriately with a normal affect.     Assessment and Plan:  -Heart block ( second degree , AV block 3;1, likely mobitz 1) in the setting of pt sleeping and likely increased vagal tone from pain medication. Additionally pt is asymptomatic from this . He is not on any AV nodal blocker. Can consider sleep study as out pt to eval for OSA.   - CAD - chronic stable angina controlled with medications and not life style limiting . Cont current medication   - Leg pain - workup per primary , although pt has extensive PAD hx , critical leg ischemia less is unlikely since he has palpable PT and AT on the right side.   Signed, Grafton Folk M.D  09/01/2013, 1:58 AM

## 2013-09-01 NOTE — Progress Notes (Signed)
Pt up to [facility] .Marland Kitchen All (R) toes turned purple and cold ..pulses intact and palpable. Returned to bed and toes warmed up and normal color returned. Pulses remained palpable. Susie Cassette RN

## 2013-09-01 NOTE — Progress Notes (Signed)
Utilization Review Completed.Kristopher Ambrocio T Dowell5/25/2015  

## 2013-09-02 DIAGNOSIS — M5137 Other intervertebral disc degeneration, lumbosacral region: Secondary | ICD-10-CM

## 2013-09-02 DIAGNOSIS — G894 Chronic pain syndrome: Secondary | ICD-10-CM | POA: Diagnosis not present

## 2013-09-02 DIAGNOSIS — I441 Atrioventricular block, second degree: Secondary | ICD-10-CM | POA: Diagnosis not present

## 2013-09-02 DIAGNOSIS — I714 Abdominal aortic aneurysm, without rupture, unspecified: Secondary | ICD-10-CM

## 2013-09-02 DIAGNOSIS — M1710 Unilateral primary osteoarthritis, unspecified knee: Secondary | ICD-10-CM | POA: Diagnosis not present

## 2013-09-02 DIAGNOSIS — M549 Dorsalgia, unspecified: Secondary | ICD-10-CM | POA: Diagnosis not present

## 2013-09-02 LAB — GLUCOSE, CAPILLARY
Glucose-Capillary: 112 mg/dL — ABNORMAL HIGH (ref 70–99)
Glucose-Capillary: 94 mg/dL (ref 70–99)

## 2013-09-02 MED ORDER — OXYCODONE-ACETAMINOPHEN 5-325 MG PO TABS
1.0000 | ORAL_TABLET | Freq: Four times a day (QID) | ORAL | Status: DC | PRN
  Administered 2013-09-02 – 2013-09-03 (×6): 1 via ORAL
  Filled 2013-09-02 (×6): qty 1

## 2013-09-02 MED ORDER — OXYCODONE-ACETAMINOPHEN 5-325 MG PO TABS
1.0000 | ORAL_TABLET | Freq: Four times a day (QID) | ORAL | Status: DC | PRN
  Administered 2013-09-02: 2 via ORAL
  Filled 2013-09-02: qty 2

## 2013-09-02 MED ORDER — METHOCARBAMOL 500 MG PO TABS
500.0000 mg | ORAL_TABLET | Freq: Three times a day (TID) | ORAL | Status: DC
  Administered 2013-09-02 – 2013-09-06 (×13): 500 mg via ORAL
  Filled 2013-09-02 (×15): qty 1

## 2013-09-02 MED ORDER — TRAMADOL HCL 50 MG PO TABS
100.0000 mg | ORAL_TABLET | Freq: Four times a day (QID) | ORAL | Status: DC | PRN
Start: 2013-09-02 — End: 2013-09-06
  Administered 2013-09-03 – 2013-09-05 (×8): 100 mg via ORAL
  Filled 2013-09-02 (×9): qty 2

## 2013-09-02 NOTE — Consult Note (Signed)
ORTHOPAEDIC CONSULTATION  REQUESTING PHYSICIAN: Ripudeep Krystal Eaton, MD  Chief Complaint: Right knee pain.   HPI: Kristopher [name] is a 80 y.o. male who complains of  3-4 days of R knee pain, He twisted it lifting something at home. The pain is migratory but predominantly effects his anterior knee. He has also had pain radiating to his feet. No fevers. He has had a small swelling on the proximal medial knee for many years that has not changed  Past Medical History  Diagnosis Date  . HTN (hypertension)   . CAD (coronary artery disease)     s/p PCI of LAD 1998;  s/p DES to RCA 05/2009;  Myoview 4/13: low risk, no ischemia, EF 56%  . Aortic stenosis     echo 4/12: normal LVF, mild LAE, no significant AS  . HLD (hyperlipidemia)   . PAD (peripheral artery disease)     s/p L CIA stent 09/2011 ([facility])  . Renal artery stenosis     s/p bilat RA stents  . Esophageal reflux   . DJD (degenerative joint disease), lumbar 07/06/2011  . Urine incontinence   . Acute myocardial infarction, unspecified site, episode of care unspecified 1998  . COPD (chronic obstructive pulmonary disease)   . AAA (abdominal aortic aneurysm)     ultrasound 07/2011:  2.7 x 2.8 cm   Past Surgical History  Procedure Laterality Date  . Pci of lad  05/1996    STENT X 4 TOTAL  . Hemorrhoid surgery  1954  . Cataract extraction w/ intraocular lens  implant, bilateral  YRS AGO  . Vasectomy  1968  . Transurethral resection of prostate  08/11/2011    Procedure: TRANSURETHRAL RESECTION OF THE PROSTATE WITH GYRUS INSTRUMENTS;  Surgeon: Fredricka Bonine, MD;  Location: [facility];  Service: Urology;  Laterality: N/A;     . Coronary angioplasty with stent placement     History   Social History  . Marital Status: Widowed    Spouse Name: N/A    Number of Children: N/A  . Years of Education: GED   Occupational History  . retired    Social History Main Topics  . Smoking status: Former Smoker -- 3.00 packs/day for 38 years      Quit date: 04/10/1988  . Smokeless tobacco: Never Used  . Alcohol Use: No  . Drug Use: No  . Sexual Activity: None   Other Topics Concern  . None   Social History Narrative  . None   Family History  Problem Relation Age of Onset  . Heart disease Mother   . Heart disease Father    Allergies  Allergen Reactions  . Influenza Vaccines Other (See Comments)    unknown  . Sulfa Antibiotics Rash   Prior to Admission medications   Medication Sig Start Date End Date Taking? Authorizing Provider  Ascorbic Acid (VITAMIN C) 500 MG tablet Take 500 mg by mouth 2 (two) times daily.    Yes Historical Provider, MD  aspirin 325 MG EC tablet Take 325 mg by mouth daily.   Yes Historical Provider, MD  atorvastatin (LIPITOR) 80 MG tablet Take 1 tablet (80 mg total) by mouth daily. 03/27/13  Yes Lelon Perla, MD  BEE POLLEN PO Take by mouth.   Yes Historical Provider, MD  benzonatate (TESSALON) 100 MG capsule 1-2 tab by mouth every 6 hrs as needed for cough 04/17/13  Yes [name], MD  Co-Enzyme Q-10 100 MG CAPS Take 1  capsule by mouth daily.   Yes Historical Provider, MD  cyclobenzaprine (FLEXERIL) 10 MG tablet Take 1 tablet (10 mg total) by mouth 2 (two) times daily as needed for muscle spasms. 01/16/13  Yes [name], MD  fish oil-omega-3 fatty acids 1000 MG capsule Take 1 g by mouth daily.    Yes Historical Provider, MD  HYDROcodone-acetaminophen (NORCO/VICODIN) 5-325 MG per tablet Take 1 tablet by mouth 2 (two) times daily as needed. To fill September 14, 2013 07/16/13  Yes [name], MD  Krill Oil 300 MG CAPS Take by mouth.   Yes Historical Provider, MD  Multiple Vitamin (MULTIVITAMIN) capsule Take 1 capsule by mouth daily.    Yes Historical Provider, MD  nitroGLYCERIN (NITROSTAT) 0.4 MG SL tablet Place 1 tablet (0.4 mg total) under the tongue every 5 (five) minutes as needed for chest pain. 12/19/12  Yes Lelon Perla, MD  omeprazole (PRILOSEC) 20 MG capsule Take 20 mg by mouth 2 (two)  times daily before a meal. 2 tabs by mouth per day 04/01/13  Yes [name], MD  ramipril (ALTACE) 10 MG capsule Take 1 capsule (10 mg total) by mouth daily with breakfast. 12/19/12  Yes Lelon Perla, MD  vitamin E 400 UNIT capsule Take 400 Units by mouth daily.     Yes Historical Provider, MD   Dg Thoracic Spine 2 View  08/31/2013   CLINICAL DATA:  Thoracic pain for 5 days question twist injury  EXAM: THORACIC SPINE - 2 VIEW  COMPARISON:  Chest radiographs 08/09/2011  FINDINGS: Diffuse osseous demineralization.  12 pairs of ribs.  Vertebral body and disk space heights maintained.  No definite fracture, subluxation or bone destruction.  Visualized posterior ribs intact.  Streaky atelectasis medial LEFT lower lobe.  IMPRESSION: Osseous demineralization.  No definite acute thoracic spine abnormalities.   Electronically Signed   By: Lavonia Dana M.D.   On: 08/31/2013 23:00   Dg Lumbar Spine 2-3 Views  08/31/2013   CLINICAL DATA:  Low back pain for 5 days, question twist injury  EXAM: LUMBAR SPINE - 2-3 VIEW  COMPARISON:  None  FINDINGS: Five non-rib-bearing lumbar vertebrae.  Diffuse osseous demineralization.  Vertebral body and disk space heights maintained.  No acute fracture, subluxation or bone destruction.  Atherosclerotic calcifications aorta with LEFT iliac stent noted.  SI joints symmetric.  Numerous EKG leads project over abdomen.  IMPRESSION: Osseous demineralization.  No acute bony findings.   Electronically Signed   By: Lavonia Dana M.D.   On: 08/31/2013 22:57   Dg Pelvis 1-2 Views  08/31/2013   CLINICAL DATA:  RIGHT hip pain for 5 days  EXAM: PELVIS - 1-2 VIEW  COMPARISON:  None  FINDINGS: Osseous demineralization.  Hip and SI joint spaces symmetric and preserved.  No acute fracture, dislocation or bone destruction.  LEFT pelvic phleboliths and LEFT iliac stent noted.  IMPRESSION: Osseous demineralization.  No acute bony findings.   Electronically Signed   By: Lavonia Dana M.D.   On:  08/31/2013 23:00   Ct Angio Abdomen W/cm &/or Wo Contrast  09/01/2013   CLINICAL DATA:  Severe back pain; history of abdominal aortic aneurysm.  EXAM: CT ANGIOGRAPHY ABDOMEN  TECHNIQUE: Multidetector CT imaging of the abdomen was performed using the standard protocol during bolus administration of intravenous contrast. Multiplanar reconstructed images including MIPs were obtained and reviewed to evaluate the vascular anatomy.  CONTRAST:  52m OMNIPAQUE IOHEXOL 350 MG/ML SOLN  COMPARISON:  CT of the  abdomen and pelvis performed 05/24/2011  FINDINGS: Scarring and bronchiectasis are noted at the lung bases, with underlying emphysematous change.  There is no evidence of abdominal aortic aneurysm. Diffuse calcification is seen along the abdominal aorta and its branches, with associated mild intramural thrombus but no significant luminal narrowing. There is ectasia of the abdominal aorta, measuring up to 2.8 cm in AP dimension. The aortic bifurcation is grossly unremarkable in appearance.  The celiac trunk is unremarkable in appearance; the splenic artery is somewhat diminutive, with minimal calcification. Mild calcification is noted along the proximal superior mesenteric artery. There is calcification at the origins of both renal arteries, though a small accessory left-sided renal artery is also seen. The inferior mesenteric artery remains patent.  Scattered calcified granulomata are seen within the liver. A vague 1.5 cm focus of contrast blush within the medial right hepatic lobe is nonspecific. The liver is otherwise grossly unremarkable. The spleen is within normal limits. The gallbladder is mildly distended but otherwise unremarkable. The pancreatic duct appears mildly prominent, measuring up to 4 mm; is of uncertain significance, without evidence of distal obstruction. Mild bilateral adrenal prominence could reflect mild adrenal hyperplasia.  A large 5.1 cm cyst is noted at the interpole region of the left  kidney. The kidneys are otherwise grossly unremarkable in appearance. There is no evidence of hydronephrosis. No renal or ureteral stones are seen. No perinephric stranding is appreciated.  No free fluid is identified. The visualized portions of the small bowel are unremarkable in appearance. The stomach is within normal limits. No acute vascular abnormalities are seen.  The appendix is only partially characterized but appears normal in caliber, without evidence for appendicitis. The visualized portions of the colon are filled with stool and grossly unremarkable in appearance.  No acute osseous abnormalities are identified.  Review of the MIP images confirms the above findings.  IMPRESSION: 1. No evidence of abdominal aortic aneurysm. There is ectasia of the abdominal aorta to 2.8 cm in AP dimension, without evidence of aneurysmal dilatation. Diffuse calcification along the abdominal aorta and its branches, with associated mild intramural thrombus but no significant luminal narrowing. 2. Calcification particularly at the origins of both renal arteries. 3. Vague nonspecific 1.5 cm focus of contrast blush at the medial right hepatic lobe, not visualized on the prior study. Dynamic liver protocol MRI or CT could be considered for further evaluation, when the patient is able to hold his breath for the study. 4. 5.1 cm left renal cyst noted. 5. Nonspecific mild prominence of the pancreatic duct, measuring up to 4 mm. No evidence of distal obstruction. This is of uncertain significance, and slightly more prominent than in 2013. 6. Mild bilateral adrenal prominence could reflect mild adrenal hyperplasia. 7. Scarring and bronchiectasis at the lung bases, with underlying emphysematous change.   Electronically Signed   By: Garald Balding M.D.   On: 09/01/2013 02:25   Dg Knee Complete 4 Views Right  08/31/2013   CLINICAL DATA:  RIGHT anterior knee pain for 5 days  EXAM: RIGHT KNEE - COMPLETE 4+ VIEW  COMPARISON:  None   FINDINGS: Osseous demineralization.  Minimal medial compartment joint space narrowing.  No acute fracture, dislocation, or bone destruction.  No knee joint effusion.  Small patellar spur at quadriceps tendon insertion.  Scattered atherosclerotic calcification at popliteal artery.  IMPRESSION: Osseous demineralization with minimal degenerative changes.  No acute abnormalities.   Electronically Signed   By: Lavonia Dana M.D.   On: 08/31/2013 22:58  Positive ROS: All other systems have been reviewed and were otherwise negative with the exception of those mentioned in the HPI and as above.  Labs cbc  Recent Labs  08/31/13 1645 09/01/13 0230  WBC 8.1 4.8  HGB 12.3* 13.3  HCT 37.4* 39.9  PLT 229 222    Labs inflam No results found for this basename: ESR, CRP,  in the last 72 hours  Labs coag  Recent Labs  08/31/13 1645  INR 1.01     Recent Labs  08/31/13 1645 09/01/13 0230  NA 142 141  K 4.4 4.6  CL 106 106  CO2 24 22  GLUCOSE 96 158*  BUN 24* 27*  CREATININE 1.01 1.13  CALCIUM 9.5 9.0    Physical Exam: Filed Vitals:   09/02/13 0520  BP: 121/56  Pulse: 70  Temp: 97.8 F (36.6 C)  Resp: 18   General: Alert, no acute distress Cardiovascular: No pedal edema Respiratory: No cyanosis, no use of accessory musculature GI: No organomegaly, abdomen is soft and non-tender Skin: No lesions in the area of chief complaint Neurologic: Sensation intact distally Psychiatric: Patient is competent for consent with normal mood and affect Lymphatic: No axillary or cervical lymphadenopathy  MUSCULOSKELETAL:  RLE: comartments soft, no effusion at the knee. Tender over the medial joint line and ant knee. Intact extensor mechanism but some weakness in quad and DF/PF due to pain per his report. No erythema. Ligament exam is limited but stable.  Other extremities are atraumatic with painless ROM and NVI.  Assessment: R knee pain, likely synovitis/arthritic flare vs. Radicular  pain  Plan: I will perform a R knee depomedrol injection tomorrow am and if he gets relief he will f/u with me for his knee pain, if no relief then his L-spine should be worked up.  Weight Bearing Status: WBAT PT VTE px: per primary team   Renette Butters, MD Cell [phone]   09/02/2013 10:54 AM

## 2013-09-02 NOTE — Progress Notes (Signed)
Patient: Kristopher [name] / Admit Date: 08/31/2013 / Date of Encounter: 09/02/2013, 8:16 AM  Subjective  Knee continues to cause pain. His whole RLE from thigh down is tender to touch. He also has pain in his RLE when he lifts up his LLE.  Objective   Telemetry: NSR, rare isolated dropped beat (appears to be Wenckebach) and then transient episodes of 2:1 HB overnight (HR ~40) around 3am - patient endorses that he was asleep overnight  Physical Exam: Blood pressure 121/56, pulse 70, temperature 97.8 F (36.6 C), temperature source Oral, resp. rate 18, height 5\' 9"  (1.753 m), weight 150 lb (68.04 kg), SpO2 94.00%. General: Well developed elderly WM in no acute distress. Head: Normocephalic, atraumatic, sclera non-icteric, no xanthomas, nares are without discharge. Neck: JVP not elevated. Lungs: Clear bilaterally to auscultation without wheezes, rales, or rhonchi. Breathing is unlabored. Heart: RRR S1 S2 without murmurs, rubs, or gallops.  Abdomen: Soft, non-tender, non-distended with normoactive bowel sounds. No rebound/guarding. Extremities: No clubbing or cyanosis. No edema. 2+ right PT pulse. 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 Neuro: Alert and oriented X 3. Moves all extremities spontaneously. Psych:  Responds to questions appropriately with a normal affect.   Intake/Output Summary (Last 24 hours) at 09/02/13 0816 Last data filed at 09/02/13 0520  Gross per 24 hour  Intake 124.17 ml  Output   2380 ml  Net -2255.83 ml    Inpatient Medications:  . amLODipine  10 mg Oral Daily  . aspirin  325 mg Oral Daily  . atorvastatin  80 mg Oral Daily  . heparin subcutaneous  5,000 Units Subcutaneous 3 times per day  . methocarbamol  500 mg Oral TID  . multivitamin with minerals  1 tablet Oral Daily  . omega-3 acid ethyl esters  1 g Oral Daily  . pantoprazole  40 mg Oral Daily  . ramipril  10 mg Oral Q breakfast  .  vitamin C  500 mg Oral BID   Infusions:    Labs:  Recent Labs  08/31/13 1645 09/01/13 0230  NA 142 141  K 4.4 4.6  CL 106 106  CO2 24 22  GLUCOSE 96 158*  BUN 24* 27*  CREATININE 1.01 1.13  CALCIUM 9.5 9.0  MG  --  1.9    Recent Labs  09/01/13 0230  AST 25  ALT 21  ALKPHOS 83  BILITOT 0.4  PROT 6.3  ALBUMIN 3.4*    Recent Labs  08/31/13 1645 09/01/13 0230  WBC 8.1 4.8  NEUTROABS 4.7 4.1  HGB 12.3* 13.3  HCT 37.4* 39.9  MCV 90.1 91.5  PLT 229 222    Recent Labs  09/01/13 0230  TROPONINI <0.30   No components found with this basename: POCBNP,  No results found for this basename: HGBA1C,  in the last 72 hours   Radiology/Studies:  Dg Thoracic Spine 2 View  08/31/2013   CLINICAL DATA:  Thoracic pain for 5 days question twist injury  EXAM: THORACIC SPINE - 2 VIEW  COMPARISON:  Chest radiographs 08/09/2011  FINDINGS: Diffuse osseous demineralization.  12 pairs of ribs.  Vertebral body and disk space heights maintained.  No definite fracture, subluxation or bone destruction.  Visualized posterior ribs intact.  Streaky atelectasis medial LEFT lower lobe.  IMPRESSION: Osseous demineralization.  No definite acute thoracic spine abnormalities.   Electronically Signed   By: Lavonia Dana M.D.   On: 08/31/2013 23:00   Dg  Lumbar Spine 2-3 Views  08/31/2013   CLINICAL DATA:  Low back pain for 5 days, question twist injury  EXAM: LUMBAR SPINE - 2-3 VIEW  COMPARISON:  None  FINDINGS: Five non-rib-bearing lumbar vertebrae.  Diffuse osseous demineralization.  Vertebral body and disk space heights maintained.  No acute fracture, subluxation or bone destruction.  Atherosclerotic calcifications aorta with LEFT iliac stent noted.  SI joints symmetric.  Numerous EKG leads project over abdomen.  IMPRESSION: Osseous demineralization.  No acute bony findings.   Electronically Signed   By: Lavonia Dana M.D.   On: 08/31/2013 22:57   Dg Pelvis 1-2 Views  08/31/2013   CLINICAL DATA:  RIGHT  hip pain for 5 days  EXAM: PELVIS - 1-2 VIEW  COMPARISON:  None  FINDINGS: Osseous demineralization.  Hip and SI joint spaces symmetric and preserved.  No acute fracture, dislocation or bone destruction.  LEFT pelvic phleboliths and LEFT iliac stent noted.  IMPRESSION: Osseous demineralization.  No acute bony findings.   Electronically Signed   By: Lavonia Dana M.D.   On: 08/31/2013 23:00   Ct Angio Abdomen W/cm &/or Wo Contrast  09/01/2013   CLINICAL DATA:  Severe back pain; history of abdominal aortic aneurysm.  EXAM: CT ANGIOGRAPHY ABDOMEN  TECHNIQUE: Multidetector CT imaging of the abdomen was performed using the standard protocol during bolus administration of intravenous contrast. Multiplanar reconstructed images including MIPs were obtained and reviewed to evaluate the vascular anatomy.  CONTRAST:  27mL OMNIPAQUE IOHEXOL 350 MG/ML SOLN  COMPARISON:  CT of the abdomen and pelvis performed 05/24/2011  FINDINGS: Scarring and bronchiectasis are noted at the lung bases, with underlying emphysematous change.  There is no evidence of abdominal aortic aneurysm. Diffuse calcification is seen along the abdominal aorta and its branches, with associated mild intramural thrombus but no significant luminal narrowing. There is ectasia of the abdominal aorta, measuring up to 2.8 cm in AP dimension. The aortic bifurcation is grossly unremarkable in appearance.  The celiac trunk is unremarkable in appearance; the splenic artery is somewhat diminutive, with minimal calcification. Mild calcification is noted along the proximal superior mesenteric artery. There is calcification at the origins of both renal arteries, though a small accessory left-sided renal artery is also seen. The inferior mesenteric artery remains patent.  Scattered calcified granulomata are seen within the liver. A vague 1.5 cm focus of contrast blush within the medial right hepatic lobe is nonspecific. The liver is otherwise grossly unremarkable. The spleen  is within normal limits. The gallbladder is mildly distended but otherwise unremarkable. The pancreatic duct appears mildly prominent, measuring up to 4 mm; is of uncertain significance, without evidence of distal obstruction. Mild bilateral adrenal prominence could reflect mild adrenal hyperplasia.  A large 5.1 cm cyst is noted at the interpole region of the left kidney. The kidneys are otherwise grossly unremarkable in appearance. There is no evidence of hydronephrosis. No renal or ureteral stones are seen. No perinephric stranding is appreciated.  No free fluid is identified. The visualized portions of the small bowel are unremarkable in appearance. The stomach is within normal limits. No acute vascular abnormalities are seen.  The appendix is only partially characterized but appears normal in caliber, without evidence for appendicitis. The visualized portions of the colon are filled with stool and grossly unremarkable in appearance.  No acute osseous abnormalities are identified.  Review of the MIP images confirms the above findings.  IMPRESSION: 1. No evidence of abdominal aortic aneurysm. There is ectasia of the abdominal  aorta to 2.8 cm in AP dimension, without evidence of aneurysmal dilatation. Diffuse calcification along the abdominal aorta and its branches, with associated mild intramural thrombus but no significant luminal narrowing. 2. Calcification particularly at the origins of both renal arteries. 3. Vague nonspecific 1.5 cm focus of contrast blush at the medial right hepatic lobe, not visualized on the prior study. Dynamic liver protocol MRI or CT could be considered for further evaluation, when the patient is able to hold his breath for the study. 4. 5.1 cm left renal cyst noted. 5. Nonspecific mild prominence of the pancreatic duct, measuring up to 4 mm. No evidence of distal obstruction. This is of uncertain significance, and slightly more prominent than in 2013. 6. Mild bilateral adrenal  prominence could reflect mild adrenal hyperplasia. 7. Scarring and bronchiectasis at the lung bases, with underlying emphysematous change.   Electronically Signed   By: Garald Balding M.D.   On: 09/01/2013 02:25   Dg Knee Complete 4 Views Right  08/31/2013   CLINICAL DATA:  RIGHT anterior knee pain for 5 days  EXAM: RIGHT KNEE - COMPLETE 4+ VIEW  COMPARISON:  None  FINDINGS: Osseous demineralization.  Minimal medial compartment joint space narrowing.  No acute fracture, dislocation, or bone destruction.  No knee joint effusion.  Small patellar spur at quadriceps tendon insertion.  Scattered atherosclerotic calcification at popliteal artery.  IMPRESSION: Osseous demineralization with minimal degenerative changes.  No acute abnormalities.   Electronically Signed   By: Lavonia Dana M.D.   On: 08/31/2013 22:58     Assessment and Plan  1. Asymptomatic bradycardia- Mobitz 1 and 2:1 AV block, felt to be in the setting of pt sleeping and increased vagal tone from pain medication. Not on any AV nodal blocking agents. He has been asymptomatic with this. TSH wnl. As heart block has recurred overnight, will discuss further mgmt with MD.  2. Knee pain - Felt to be muskuloskeletal; management per primary care. Consider ortho eval. He also reported his toes turning colors. In order to get relief or put his socks on, he has been having to lift his RLE and put it on his LLE, i.e. crossing his legs. His R foot does appear to darken some with this (but not black despite patient noting "it's turning black!") - I wonder if this finding is related to vascular congestion from crossing his legs in the setting of known PVD. Do not feel his knee pain is acutely related to vascular issues. 3. CAD s/p PCI of LAD in 1998, RCA 2011, low risk myoview 2013 - continue ASA and statin.  4. Hypertension -continue altace 5.. PVD with L CIA stent 2013, RAS s/p bilat stents, AAA (2.6x2.7 in 12/2012 - due 12/2013 - CTA with 2.8cm ectasia) - f/u as  OP. Pulses in tact and symptoms not felt to be related to PAD this admission. 6. Ancillary CT findings - vague nonspecific 1.5 cm focus of contrast blush at the medial right hepatic lobe, 5.1cm L renal cyst, mild prominence of pancreatic duct - will defer to IM.  Signed, Melina Copa PA-C As above, patient seen and examined. He continues to complain of right lower extremity pain predominantly in the knee. He denies dyspnea, chest pain or syncope. Note he has 2+ dorsalis pedis pulse as well as posterior tibial pulse on the right. Pain appears to be musculoskeletal. He may require orthopedic consult. Telemetry reviewed and there was transient 2-1 AV block in the early morning hours. There is no indication  for pacemaker at this point. Avoid AV nodal blocking agents. We will follow from a distance. Lelon Perla

## 2013-09-02 NOTE — Progress Notes (Signed)
Patient ID: Kristopher [name]  male  ZWC:[id number]    DOB: [birth year]    DOA: 08/31/2013  PCP: [name], MD  Admit HPI / Brief Narrative:  80 y.o. male w/ a history of CAD status post stenting, peripheral arterial disease status post stenting, renal artery stenosis status post stenting, hypertension, COPD, and abdominal aortic aneurysm who was brought to the ER with increasing low back pain over one week. Had developed low back pain 7 days prior after he was trying to get into a truck. 3 days later his pain worsened after he carried heavy weight. Pain mostly in lower back radiating to the right ankle.  In the ER the patient was given multiple doses of pain medications and Ativan. Patient did not have any incontinence of urine or bowel. He was incidentally found to have second-degree AV block.    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 Percell Miller from orthopedics on call, initially I had ordered MRI of the right knee to rule out any cartilage tear by Dr. Percell Miller recommended to hold off on the MRI until his evaluation and the patient might need a joint aspiration for further workup. - Patient also had reported his toes turning black on the time of admission but then eturned to normal color. ? Underlying  peripheral vascular disease, will check ABIs - Added tramadol, Robaxin, decrease Percocet due to his bradycardia HR 30's after narcotics from increased we'll tone  Bradycardia - Second degree AV block  - Cardiology following, patient had transient 2:1 AV block in the morning, per cardiology no indication of pacemaker at this time, avoid very nodal blocking  agents   HTN  - Currently stable    Vague nonspecific 1.5 cm focus of contrast blush at the medial right hepatic lobe  An incidental finding - per Radiol[facility]oy "Dynamic liver protocol MRI or CT could be considered for further evaluation, when the patient is able to hold his breath for the study."  CAD s/p PCI w/ stents x4 1998  Asymptomatic   AoS  Vascular disease  DJD lumbar spine   COPD  - Currently stable, no wheezing   Reported hx of AAA  CTA of abdom states "no evidence of AAA"   DVT Prophylaxis:  Code Status:  Family Communication:  Disposition:  Consultants:  Cardiology  Orthopedics, Dr. Percell Miller called today  Procedures:  None   Antibiotics:  None    Subjective: Patient seen and examined having significant pain in the right knee, prior to this admission patient was bleeding without any difficulty.  Objective: Weight change:   Intake/Output Summary (Last 24 hours) at 09/02/13 0948 Last data filed at 09/02/13 0520  Gross per 24 hour  Intake 124.17 ml  Output   1580 ml  Net -1455.83 ml   Blood pressure 121/56, pulse 70, temperature 97.8 F (36.6 C), temperature source Oral, resp. rate 18, height 5\' 9"  (1.753 m), weight 68.04 kg (150 lb), SpO2 94.00%.  Physical Exam: General: Alert and awake, oriented x3, not in any acute distress. CVS: S1-S2 clear, no murmur rubs or gallops Chest: clear to auscultation bilaterally, no wheezing, rales  or rhonchi Abdomen: soft nontender, nondistended, normal bowel sounds  Extremities: no cyanosis, clubbing or edema noted bilaterally. Right knee tender with some swelling compared to the left knee, small lipoma chronic, range of movement decreased due to pain  Neuro: Cranial nerves II-XII intact, no focal neurological deficits  Lab Results: Basic Metabolic Panel:  Recent Labs Lab 08/31/13 1645 09/01/13 0230  NA 142 141  K 4.4 4.6  CL 106 106  CO2 24 22  GLUCOSE 96 158*  BUN 24* 27*  CREATININE 1.01 1.13   CALCIUM 9.5 9.0  MG  --  1.9   Liver Function Tests:  Recent Labs Lab 09/01/13 0230  AST 25  ALT 21  ALKPHOS 83  BILITOT 0.4  PROT 6.3  ALBUMIN 3.4*   No results found for this basename: LIPASE, AMYLASE,  in the last 168 hours No results found for this basename: AMMONIA,  in the last 168 hours CBC:  Recent Labs Lab 08/31/13 1645 09/01/13 0230  WBC 8.1 4.8  NEUTROABS 4.7 4.1  HGB 12.3* 13.3  HCT 37.4* 39.9  MCV 90.1 91.5  PLT 229 222   Cardiac Enzymes:  Recent Labs Lab 09/01/13 0230  TROPONINI <0.30   BNP: No components found with this basename: POCBNP,  CBG:  Recent Labs Lab 09/01/13 1613 09/01/13 2106 09/02/13 0605  GLUCAP 96 113* 112*     Micro Results: Recent Results (from the past 240 hour(s))  MRSA PCR SCREENING     Status: None   Collection Time    09/01/13  4:38 AM      Result Value Ref Range Status   MRSA by PCR NEGATIVE  NEGATIVE Final   Comment:            The GeneXpert MRSA Assay (FDA     approved for NASAL specimens     only), is one component of a     comprehensive MRSA colonization     surveillance program. It is not     intended to diagnose MRSA     infection nor to guide or     monitor treatment for     MRSA infections.    Studies/Results: Dg Thoracic Spine 2 View  08/31/2013   CLINICAL DATA:  Thoracic pain for 5 days question twist injury  EXAM: THORACIC SPINE - 2 VIEW  COMPARISON:  Chest radiographs 08/09/2011  FINDINGS: Diffuse osseous demineralization.  12 pairs of ribs.  Vertebral body and disk space heights maintained.  No definite fracture, subluxation or bone destruction.  Visualized posterior ribs intact.  Streaky atelectasis medial LEFT lower lobe.  IMPRESSION: Osseous demineralization.  No definite acute thoracic spine abnormalities.   Electronically Signed   By: Lavonia Dana M.D.   On: 08/31/2013 23:00   Dg Lumbar Spine 2-3 Views  08/31/2013   CLINICAL DATA:  Low back pain for 5 days, question twist injury  EXAM:  LUMBAR SPINE - 2-3 VIEW  COMPARISON:  None  FINDINGS: Five non-rib-bearing lumbar vertebrae.  Diffuse osseous demineralization.  Vertebral body and disk space heights maintained.  No acute fracture, subluxation or bone destruction.  Atherosclerotic calcifications aorta with LEFT iliac stent noted.  SI joints symmetric.  Numerous EKG leads project over abdomen.  IMPRESSION: Osseous demineralization.  No acute bony findings.   Electronically Signed   By: Lavonia Dana M.D.   On: 08/31/2013 22:57   Dg Pelvis 1-2 Views  08/31/2013   CLINICAL DATA:  RIGHT hip pain for 5 days  EXAM: PELVIS -  1-2 VIEW  COMPARISON:  None  FINDINGS: Osseous demineralization.  Hip and SI joint spaces symmetric and preserved.  No acute fracture, dislocation or bone destruction.  LEFT pelvic phleboliths and LEFT iliac stent noted.  IMPRESSION: Osseous demineralization.  No acute bony findings.   Electronically Signed   By: Lavonia Dana M.D.   On: 08/31/2013 23:00   Ct Angio Abdomen W/cm &/or Wo Contrast  09/01/2013   CLINICAL DATA:  Severe back pain; history of abdominal aortic aneurysm.  EXAM: CT ANGIOGRAPHY ABDOMEN  TECHNIQUE: Multidetector CT imaging of the abdomen was performed using the standard protocol during bolus administration of intravenous contrast. Multiplanar reconstructed images including MIPs were obtained and reviewed to evaluate the vascular anatomy.  CONTRAST:  70mL OMNIPAQUE IOHEXOL 350 MG/ML SOLN  COMPARISON:  CT of the abdomen and pelvis performed 05/24/2011  FINDINGS: Scarring and bronchiectasis are noted at the lung bases, with underlying emphysematous change.  There is no evidence of abdominal aortic aneurysm. Diffuse calcification is seen along the abdominal aorta and its branches, with associated mild intramural thrombus but no significant luminal narrowing. There is ectasia of the abdominal aorta, measuring up to 2.8 cm in AP dimension. The aortic bifurcation is grossly unremarkable in appearance.  The celiac  trunk is unremarkable in appearance; the splenic artery is somewhat diminutive, with minimal calcification. Mild calcification is noted along the proximal superior mesenteric artery. There is calcification at the origins of both renal arteries, though a small accessory left-sided renal artery is also seen. The inferior mesenteric artery remains patent.  Scattered calcified granulomata are seen within the liver. A vague 1.5 cm focus of contrast blush within the medial right hepatic lobe is nonspecific. The liver is otherwise grossly unremarkable. The spleen is within normal limits. The gallbladder is mildly distended but otherwise unremarkable. The pancreatic duct appears mildly prominent, measuring up to 4 mm; is of uncertain significance, without evidence of distal obstruction. Mild bilateral adrenal prominence could reflect mild adrenal hyperplasia.  A large 5.1 cm cyst is noted at the interpole region of the left kidney. The kidneys are otherwise grossly unremarkable in appearance. There is no evidence of hydronephrosis. No renal or ureteral stones are seen. No perinephric stranding is appreciated.  No free fluid is identified. The visualized portions of the small bowel are unremarkable in appearance. The stomach is within normal limits. No acute vascular abnormalities are seen.  The appendix is only partially characterized but appears normal in caliber, without evidence for appendicitis. The visualized portions of the colon are filled with stool and grossly unremarkable in appearance.  No acute osseous abnormalities are identified.  Review of the MIP images confirms the above findings.  IMPRESSION: 1. No evidence of abdominal aortic aneurysm. There is ectasia of the abdominal aorta to 2.8 cm in AP dimension, without evidence of aneurysmal dilatation. Diffuse calcification along the abdominal aorta and its branches, with associated mild intramural thrombus but no significant luminal narrowing. 2. Calcification  particularly at the origins of both renal arteries. 3. Vague nonspecific 1.5 cm focus of contrast blush at the medial right hepatic lobe, not visualized on the prior study. Dynamic liver protocol MRI or CT could be considered for further evaluation, when the patient is able to hold his breath for the study. 4. 5.1 cm left renal cyst noted. 5. Nonspecific mild prominence of the pancreatic duct, measuring up to 4 mm. No evidence of distal obstruction. This is of uncertain significance, and slightly more prominent than in 2013. 6. Mild  bilateral adrenal prominence could reflect mild adrenal hyperplasia. 7. Scarring and bronchiectasis at the lung bases, with underlying emphysematous change.   Electronically Signed   By: Garald Balding M.D.   On: 09/01/2013 02:25   Dg Knee Complete 4 Views Right  08/31/2013   CLINICAL DATA:  RIGHT anterior knee pain for 5 days  EXAM: RIGHT KNEE - COMPLETE 4+ VIEW  COMPARISON:  None  FINDINGS: Osseous demineralization.  Minimal medial compartment joint space narrowing.  No acute fracture, dislocation, or bone destruction.  No knee joint effusion.  Small patellar spur at quadriceps tendon insertion.  Scattered atherosclerotic calcification at popliteal artery.  IMPRESSION: Osseous demineralization with minimal degenerative changes.  No acute abnormalities.   Electronically Signed   By: Lavonia Dana M.D.   On: 08/31/2013 22:58    Medications: Scheduled Meds: . amLODipine  10 mg Oral Daily  . aspirin  325 mg Oral Daily  . atorvastatin  80 mg Oral Daily  . heparin subcutaneous  5,000 Units Subcutaneous 3 times per day  . methocarbamol  500 mg Oral TID  . multivitamin with minerals  1 tablet Oral Daily  . omega-3 acid ethyl esters  1 g Oral Daily  . pantoprazole  40 mg Oral Daily  . ramipril  10 mg Oral Q breakfast  . vitamin C  500 mg Oral BID      LOS: 2 days   Ripudeep Krystal Eaton M.D. [facility] 09/02/2013, 9:48 AM Pager: [phone]  If 7PM-7AM, please contact  night-coverage [email or web address] Password [email or web address]  **Disclaimer: This note was dictated with voice recognition software. Similar sounding words can inadvertently be transcribed and this note may contain transcription errors which may not have been corrected upon publication of note.**

## 2013-09-03 DIAGNOSIS — M25569 Pain in unspecified knee: Secondary | ICD-10-CM

## 2013-09-03 DIAGNOSIS — M1710 Unilateral primary osteoarthritis, unspecified knee: Secondary | ICD-10-CM | POA: Diagnosis not present

## 2013-09-03 DIAGNOSIS — I10 Essential (primary) hypertension: Secondary | ICD-10-CM | POA: Diagnosis not present

## 2013-09-03 DIAGNOSIS — I441 Atrioventricular block, second degree: Secondary | ICD-10-CM | POA: Diagnosis not present

## 2013-09-03 DIAGNOSIS — G894 Chronic pain syndrome: Secondary | ICD-10-CM | POA: Diagnosis not present

## 2013-09-03 LAB — CBC
HCT: 35.5 % — ABNORMAL LOW (ref 39.0–52.0)
Hemoglobin: 11.7 g/dL — ABNORMAL LOW (ref 13.0–17.0)
MCH: 30.4 pg (ref 26.0–34.0)
MCHC: 33 g/dL (ref 30.0–36.0)
MCV: 92.2 fL (ref 78.0–100.0)
Platelets: 203 10*3/uL (ref 150–400)
RBC: 3.85 MIL/uL — ABNORMAL LOW (ref 4.22–5.81)
RDW: 15.7 % — ABNORMAL HIGH (ref 11.5–15.5)
WBC: 12.8 10*3/uL — ABNORMAL HIGH (ref 4.0–10.5)

## 2013-09-03 LAB — BASIC METABOLIC PANEL
BUN: 28 mg/dL — ABNORMAL HIGH (ref 6–23)
CO2: 25 mEq/L (ref 19–32)
Calcium: 8.9 mg/dL (ref 8.4–10.5)
Chloride: 103 mEq/L (ref 96–112)
Creatinine, Ser: 1.1 mg/dL (ref 0.50–1.35)
GFR calc Af Amer: 71 mL/min — ABNORMAL LOW (ref >90)
GFR calc non Af Amer: 61 mL/min — ABNORMAL LOW (ref >90)
Glucose, Bld: 106 mg/dL — ABNORMAL HIGH (ref 70–99)
Potassium: 4.3 mEq/L (ref 3.7–5.3)
Sodium: 140 mEq/L (ref 137–147)

## 2013-09-03 MED ORDER — OXYCODONE-ACETAMINOPHEN 5-325 MG PO TABS
1.0000 | ORAL_TABLET | Freq: Four times a day (QID) | ORAL | Status: DC | PRN
  Administered 2013-09-03 – 2013-09-06 (×9): 2 via ORAL
  Filled 2013-09-03 (×9): qty 2

## 2013-09-03 NOTE — Progress Notes (Signed)
Patient's wife complained that patient's pain is not being managed on the dosage that he has been prescribed here and request that it be increased. However; patient sleeping and seems comfortable. Patient himself has not directly complained to the nurse that the pain medicine is not helping. Patient stated that his pain decreased from a 7/10 to a 3/10 after the knee injection and pain medicine. Will continue to assess pain level and get subjective data from the patient. MD updated on situation.

## 2013-09-03 NOTE — Progress Notes (Signed)
I participated in the care of this patient and agree with the above history, physical and evaluation. I performed a review of the history and a physical exam as detailed   Shakaria Raphael Daniel Phinley Schall MD  

## 2013-09-03 NOTE — Progress Notes (Signed)
Patient ID: Kristopher [name]  male  ZOX:[id number]    DOB: [birth year]    DOA: 08/31/2013  PCP: [name], MD  Admit HPI / Brief Narrative:  80 y.o. male w/ a history of CAD status post stenting, peripheral arterial disease status post stenting, renal artery stenosis status post stenting, hypertension, COPD, and abdominal aortic aneurysm who was brought to the ER with increasing low back pain over one week. Had developed low back pain 7 days prior after he was trying to get into a truck. 3 days later his pain worsened after he carried heavy weight. Pain mostly in lower back radiating to the right ankle.  In the ER the patient was given multiple doses of pain medications and Ativan. Patient did not have any incontinence of urine or bowel. He was incidentally found to have second-degree AV block.    Assessment/Plan:  Severe R knee pain - ?back pain :  - Xrays of pelvis, thoracic spine, R knee, and lumbar spine are all w/o acute findings -Dr. Percell Miller recommended to hold off on the MRI - plans for a R knee depomedrol injection tomorrow am and if he gets relief he will f/u , if no relief then his L-spine should be worked up.  - Patient also had reported his toes turning black on the time of admission but then eturned to normal color. ? Underlying  peripheral vascular disease- ABIs? - Added tramadol, Robaxin, decrease Percocet due to his bradycardia HR 30's after narcotics  Bradycardia - Second degree AV block  - Cardiology following, patient had transient 2:1 AV block in the morning, per cardiology no indication of pacemaker at this time, avoid very nodal blocking agents  HTN  - Currently stable    Vague nonspecific 1.5 cm focus of contrast blush at the medial right hepatic lobe  An incidental finding - per [facility] "Dynamic liver protocol MRI or CT could be considered for further evaluation, when the patient is able to hold his breath for the study."  CAD s/p PCI w/ stents x4 1998    Asymptomatic   AoS  Vascular disease  DJD lumbar spine   COPD  - Currently stable, no wheezing   Reported hx of AAA  CTA of abdom states "no evidence of AAA"   DVT Prophylaxis:  Code Status: full  Family Communication: no family at bedside  Disposition:  Consultants:  Cardiology  Orthopedics, Dr. Percell Miller called today  Procedures:  None   Antibiotics:  None    Subjective: Still with severe pain.  Objective: Weight change:   Intake/Output Summary (Last 24 hours) at 09/03/13 1050 Last data filed at 09/03/13 8119  Gross per 24 hour  Intake      0 ml  Output   1025 ml  Net  -1025 ml   Blood pressure 108/58, pulse 97, temperature 97.8 F (36.6 C), temperature source Oral, resp. rate 18, height 5\' 9"  (1.753 m), weight 69 kg (152 lb 1.9 oz), SpO2 91.00%.  Physical Exam: General: Alert and awake, oriented x3, not in any acute distress. CVS: S1-S2 clear, no murmur rubs or gallops Chest: clear to auscultation bilaterally, no wheezing, rales or rhonchi Abdomen: soft nontender, nondistended, normal bowel sounds  Extremities: no cyanosis, clubbing or edema noted bilaterally. Right knee tender with some swelling compared to the left knee, small lipoma chronic, range of movement decreased due to pain  Neuro: Cranial nerves II-XII intact, no focal neurological deficits  Lab Results: Basic Metabolic Panel:  Recent Labs  Lab 09/01/13 0230 09/03/13 0410  NA 141 140  K 4.6 4.3  CL 106 103  CO2 22 25  GLUCOSE 158* 106*  BUN 27* 28*  CREATININE 1.13 1.10  CALCIUM 9.0 8.9  MG 1.9  --    Liver Function Tests:  Recent Labs Lab 09/01/13 0230  AST 25  ALT 21  ALKPHOS 83  BILITOT 0.4  PROT 6.3  ALBUMIN 3.4*   No results found for this basename: LIPASE, AMYLASE,  in the last 168 hours No results found for this basename: AMMONIA,  in the last 168 hours CBC:  Recent Labs Lab 09/01/13 0230 09/03/13 0410  WBC 4.8 12.8*  NEUTROABS 4.1  --   HGB 13.3  11.7*  HCT 39.9 35.5*  MCV 91.5 92.2  PLT 222 203   Cardiac Enzymes:  Recent Labs Lab 09/01/13 0230  TROPONINI <0.30   BNP: No components found with this basename: POCBNP,  CBG:  Recent Labs Lab 09/01/13 1613 09/01/13 2106 09/02/13 0605 09/02/13 1115  GLUCAP 96 113* 112* 94     Micro Results: Recent Results (from the past 240 hour(s))  MRSA PCR SCREENING     Status: None   Collection Time    09/01/13  4:38 AM      Result Value Ref Range Status   MRSA by PCR NEGATIVE  NEGATIVE Final   Comment:            The GeneXpert MRSA Assay (FDA     approved for NASAL specimens     only), is one component of a     comprehensive MRSA colonization     surveillance program. It is not     intended to diagnose MRSA     infection nor to guide or     monitor treatment for     MRSA infections.    Studies/Results: Dg Thoracic Spine 2 View  08/31/2013   CLINICAL DATA:  Thoracic pain for 5 days question twist injury  EXAM: THORACIC SPINE - 2 VIEW  COMPARISON:  Chest radiographs 08/09/2011  FINDINGS: Diffuse osseous demineralization.  12 pairs of ribs.  Vertebral body and disk space heights maintained.  No definite fracture, subluxation or bone destruction.  Visualized posterior ribs intact.  Streaky atelectasis medial LEFT lower lobe.  IMPRESSION: Osseous demineralization.  No definite acute thoracic spine abnormalities.   Electronically Signed   By: Lavonia Dana M.D.   On: 08/31/2013 23:00   Dg Lumbar Spine 2-3 Views  08/31/2013   CLINICAL DATA:  Low back pain for 5 days, question twist injury  EXAM: LUMBAR SPINE - 2-3 VIEW  COMPARISON:  None  FINDINGS: Five non-rib-bearing lumbar vertebrae.  Diffuse osseous demineralization.  Vertebral body and disk space heights maintained.  No acute fracture, subluxation or bone destruction.  Atherosclerotic calcifications aorta with LEFT iliac stent noted.  SI joints symmetric.  Numerous EKG leads project over abdomen.  IMPRESSION: Osseous  demineralization.  No acute bony findings.   Electronically Signed   By: Lavonia Dana M.D.   On: 08/31/2013 22:57   Dg Pelvis 1-2 Views  08/31/2013   CLINICAL DATA:  RIGHT hip pain for 5 days  EXAM: PELVIS - 1-2 VIEW  COMPARISON:  None  FINDINGS: Osseous demineralization.  Hip and SI joint spaces symmetric and preserved.  No acute fracture, dislocation or bone destruction.  LEFT pelvic phleboliths and LEFT iliac stent noted.  IMPRESSION: Osseous demineralization.  No acute bony findings.   Electronically Signed   By: Elta Guadeloupe  Thornton Papas M.D.   On: 08/31/2013 23:00   Ct Angio Abdomen W/cm &/or Wo Contrast  09/01/2013   CLINICAL DATA:  Severe back pain; history of abdominal aortic aneurysm.  EXAM: CT ANGIOGRAPHY ABDOMEN  TECHNIQUE: Multidetector CT imaging of the abdomen was performed using the standard protocol during bolus administration of intravenous contrast. Multiplanar reconstructed images including MIPs were obtained and reviewed to evaluate the vascular anatomy.  CONTRAST:  12mL OMNIPAQUE IOHEXOL 350 MG/ML SOLN  COMPARISON:  CT of the abdomen and pelvis performed 05/24/2011  FINDINGS: Scarring and bronchiectasis are noted at the lung bases, with underlying emphysematous change.  There is no evidence of abdominal aortic aneurysm. Diffuse calcification is seen along the abdominal aorta and its branches, with associated mild intramural thrombus but no significant luminal narrowing. There is ectasia of the abdominal aorta, measuring up to 2.8 cm in AP dimension. The aortic bifurcation is grossly unremarkable in appearance.  The celiac trunk is unremarkable in appearance; the splenic artery is somewhat diminutive, with minimal calcification. Mild calcification is noted along the proximal superior mesenteric artery. There is calcification at the origins of both renal arteries, though a small accessory left-sided renal artery is also seen. The inferior mesenteric artery remains patent.  Scattered calcified granulomata  are seen within the liver. A vague 1.5 cm focus of contrast blush within the medial right hepatic lobe is nonspecific. The liver is otherwise grossly unremarkable. The spleen is within normal limits. The gallbladder is mildly distended but otherwise unremarkable. The pancreatic duct appears mildly prominent, measuring up to 4 mm; is of uncertain significance, without evidence of distal obstruction. Mild bilateral adrenal prominence could reflect mild adrenal hyperplasia.  A large 5.1 cm cyst is noted at the interpole region of the left kidney. The kidneys are otherwise grossly unremarkable in appearance. There is no evidence of hydronephrosis. No renal or ureteral stones are seen. No perinephric stranding is appreciated.  No free fluid is identified. The visualized portions of the small bowel are unremarkable in appearance. The stomach is within normal limits. No acute vascular abnormalities are seen.  The appendix is only partially characterized but appears normal in caliber, without evidence for appendicitis. The visualized portions of the colon are filled with stool and grossly unremarkable in appearance.  No acute osseous abnormalities are identified.  Review of the MIP images confirms the above findings.  IMPRESSION: 1. No evidence of abdominal aortic aneurysm. There is ectasia of the abdominal aorta to 2.8 cm in AP dimension, without evidence of aneurysmal dilatation. Diffuse calcification along the abdominal aorta and its branches, with associated mild intramural thrombus but no significant luminal narrowing. 2. Calcification particularly at the origins of both renal arteries. 3. Vague nonspecific 1.5 cm focus of contrast blush at the medial right hepatic lobe, not visualized on the prior study. Dynamic liver protocol MRI or CT could be considered for further evaluation, when the patient is able to hold his breath for the study. 4. 5.1 cm left renal cyst noted. 5. Nonspecific mild prominence of the pancreatic  duct, measuring up to 4 mm. No evidence of distal obstruction. This is of uncertain significance, and slightly more prominent than in 2013. 6. Mild bilateral adrenal prominence could reflect mild adrenal hyperplasia. 7. Scarring and bronchiectasis at the lung bases, with underlying emphysematous change.   Electronically Signed   By: Garald Balding M.D.   On: 09/01/2013 02:25   Dg Knee Complete 4 Views Right  08/31/2013   CLINICAL DATA:  RIGHT anterior knee  pain for 5 days  EXAM: RIGHT KNEE - COMPLETE 4+ VIEW  COMPARISON:  None  FINDINGS: Osseous demineralization.  Minimal medial compartment joint space narrowing.  No acute fracture, dislocation, or bone destruction.  No knee joint effusion.  Small patellar spur at quadriceps tendon insertion.  Scattered atherosclerotic calcification at popliteal artery.  IMPRESSION: Osseous demineralization with minimal degenerative changes.  No acute abnormalities.   Electronically Signed   By: Lavonia Dana M.D.   On: 08/31/2013 22:58    Medications: Scheduled Meds: . amLODipine  10 mg Oral Daily  . aspirin  325 mg Oral Daily  . atorvastatin  80 mg Oral Daily  . heparin subcutaneous  5,000 Units Subcutaneous 3 times per day  . methocarbamol  500 mg Oral TID  . multivitamin with minerals  1 tablet Oral Daily  . omega-3 acid ethyl esters  1 g Oral Daily  . pantoprazole  40 mg Oral Daily  . ramipril  10 mg Oral Q breakfast  . vitamin C  500 mg Oral BID      LOS: 3 days   Geradine Girt  DO. [facility] 09/03/2013, 10:50 AM Pager: [phone]  If 7PM-7AM, please contact night-coverage [email or web address] Password [email or web address]

## 2013-09-03 NOTE — ED Provider Notes (Signed)
Medical screening examination/treatment/procedure(s) were performed by non-physician practitioner and as supervising physician I was immediately available for consultation/collaboration.   EKG Interpretation   Date/Time:  Sunday Aug 31 2013 21:18:26 EDT Ventricular Rate:  76 PR Interval:    QRS Duration: 136 QT Interval:  450 QTC Calculation: 506 R Axis:   79 Text Interpretation:  Sinus rhythm Atrial premature complex Second deg  AVB, Mobitz I (Wenckebach) Right bundle branch block ED PHYSICIAN  INTERPRETATION AVAILABLE IN [facility] Confirmed by TEST, Record  ([id number]) on 09/02/2013 7:37:06 AM       Virgel Manifold, MD 09/03/13 [phone]

## 2013-09-03 NOTE — Progress Notes (Signed)
Girlfriend continues to call asking for more pain medications.  When I walked past room, patient was sleeping.  Per nurse, patient is not complaining of pain, only girlfriend  Kristopher Bear DO

## 2013-09-03 NOTE — Progress Notes (Signed)
Patient ID: Kristopher [name], male   DOB: [birth year], 80 y.o.   MRN: [id number]     Subjective:  Patient reports pain as mild to moderate.  Patient complains of pain in the right knee from antero-medial to antero-lateral  Objective:   VITALS:   Filed Vitals:   09/02/13 1437 09/02/13 2130 09/03/13 0449 09/03/13 0523  BP: 111/54 107/62 108/58   Pulse: 93 78 97   Temp: 98.7 F (37.1 C) 97.3 F (36.3 C) 97.8 F (36.6 C)   TempSrc: Oral Oral Oral   Resp: 18 18 18 18   Height:      Weight:   69 kg (152 lb 1.9 oz)   SpO2: 95% 92% 87% 91%    ABD soft Sensation intact distally Dorsiflexion/Plantar flexion intact Patient has flexion from 10-110 deg Stable to varus and valgus stress Normal hip, ankle, and foot function No effusion    Lab Results  Component Value Date   WBC 12.8* 09/03/2013   HGB 11.7* 09/03/2013   HCT 35.5* 09/03/2013   MCV 92.2 09/03/2013   PLT 203 09/03/2013     Assessment/Plan:     Principal Problem:   Low back pain Active Problems:   HYPERTENSION   CAD   COPD (chronic obstructive pulmonary disease)   AV block, 2nd degree   Advance diet Up with therapy Injected the right knee with 4cc marcaine and 1 cc depo-medrol at the supralateral porthole he tolerated the procedure well and a band-aid was placed over injection site. Will continue to follow if there are more problems WBAT Continue plan per medicine   Joya Gaskins 09/03/2013, 1:44 PM   [name] MD [phone]

## 2013-09-04 DIAGNOSIS — I441 Atrioventricular block, second degree: Secondary | ICD-10-CM | POA: Diagnosis not present

## 2013-09-04 DIAGNOSIS — M545 Low back pain: Secondary | ICD-10-CM | POA: Diagnosis not present

## 2013-09-04 DIAGNOSIS — I359 Nonrheumatic aortic valve disorder, unspecified: Secondary | ICD-10-CM | POA: Diagnosis not present

## 2013-09-04 DIAGNOSIS — M25569 Pain in unspecified knee: Secondary | ICD-10-CM | POA: Diagnosis not present

## 2013-09-04 DIAGNOSIS — J449 Chronic obstructive pulmonary disease, unspecified: Secondary | ICD-10-CM | POA: Diagnosis not present

## 2013-09-04 DIAGNOSIS — G894 Chronic pain syndrome: Secondary | ICD-10-CM | POA: Diagnosis not present

## 2013-09-04 NOTE — Progress Notes (Signed)
F/u appt put in DC f/u section. Dayna Dunn PA-C

## 2013-09-04 NOTE — Evaluation (Signed)
Physical Therapy Evaluation Patient Details Name: Kristopher [name] MRN: [id number] DOB: March 28, 1931 Today's Date: 09/04/2013   History of Present Illness  80 y.o. male history of CAD status post stenting, peripheral arterial disease status post stenting, renal artery stenosis status post stenting, hypertension, COPD, abdominal aortic aneurysm was brought to the ER as patient had increasing low back pain over the last one week. Patient was found to have second-degree AV block. Pt's pain primarily in rt knee and xray negative. Rt knee injected by Dr. Fredonia Highland.  Clinical Impression  Pt presents to PT with severe rt knee pain, decr ROM of lt knee, and inability to bear wt on rt leg due to pain. Pt reports his pain was initially better this AM after injection yesterday but that now it is worse again. Pt having to hop with walker to mobilize. Needs skilled PT to maximize independence and safety so pt can return home.    Follow Up Recommendations Home health PT;Supervision - Intermittent    Equipment Recommendations  Rolling walker with 5" wheels (pt checking to see if his daughter has one he can use.)    Recommendations for Other Services       Precautions / Restrictions Precautions Precautions: None      Mobility  Bed Mobility Overal bed mobility: Modified Independent             General bed mobility comments: Has to use hands to pick up and move rt leg due to pain.  Transfers Overall transfer level: Modified independent Equipment used: Rolling walker (2 wheeled)             General transfer comment: Comes to stand without any weight bearing on his rt leg.  Ambulation/Gait Ambulation/Gait assistance: Supervision Ambulation Distance (Feet): 4 Feet (forward/backward) Assistive device: Rolling walker (2 wheeled) Gait Pattern/deviations:  (Hop to)     General Gait Details: Pt hopping on lt leg and not putting any weight on rt.  Stairs            Wheelchair  Mobility    Modified Rankin (Stroke Patients Only)       Balance Overall balance assessment: No apparent balance deficits (not formally assessed)                                           Pertinent Vitals/Pain Severe rt knee pain. Repositioned.    Home Living Family/patient expects to be discharged to:: Private residence Living Arrangements: Spouse/significant other Available Help at Discharge: Family Type of Home: House Home Access: Level entry     Home Layout: Multi-level (split level) Home Equipment: None Additional Comments: Pt has bathroom on ground level of home and can sleep down there temporarily.    Prior Function Level of Independence: Independent         Comments: Active and performing yard work     Journalist, newspaper        Extremity/Trunk Assessment   Upper Extremity Assessment: Overall WFL for tasks assessed           Lower Extremity Assessment: RLE deficits/detail RLE Deficits / Details: Knee AROM 20-95 degrees flexion against gravity. In supine knee still lacking 10 degrees from full extension. Has to pick up leg to move it on/off bed.       Communication   Communication: No difficulties  Cognition Arousal/Alertness: Awake/alert Behavior During Therapy: WFL for tasks  assessed/performed Overall Cognitive Status: Within Functional Limits for tasks assessed                      General Comments      Exercises        Assessment/Plan    PT Assessment Patient needs continued PT services  PT Diagnosis Difficulty walking;Acute pain   PT Problem List Decreased mobility;Pain  PT Treatment Interventions DME instruction;Gait training;Stair training;Functional mobility training;Therapeutic exercise;Patient/family education   PT Goals (Current goals can be found in the Care Plan section) Acute Rehab PT Goals Patient Stated Goal: Knee get better. PT Goal Formulation: With patient Time For Goal Achievement:  09/08/13 Potential to Achieve Goals: Good    Frequency Min 4X/week   Barriers to discharge        Co-evaluation               End of Session   Activity Tolerance: Patient limited by pain Patient left: in bed;with call bell/phone within reach           Time: 1615-1627 PT Time Calculation (min): 12 min   Charges:   PT Evaluation $Initial PT Evaluation Tier I: 1 Procedure PT Treatments $Gait Training: 8-22 mins   PT G CodesShary Decamp Teyana Pierron 09/04/2013, 5:14 PM  [employer] PT [phone]

## 2013-09-04 NOTE — Progress Notes (Signed)
I participated in the care of this patient and agree with the above history, physical and evaluation. I performed a review of the history and a physical exam as detailed   Husam Hohn Daniel Kharson Rasmusson MD  

## 2013-09-04 NOTE — Progress Notes (Signed)
Patient: Kristopher [name] / Admit Date: 08/31/2013 / Date of Encounter: 09/04/2013, 8:48 AM  Subjective  Knee pain improving.   Objective   Telemetry: sinus with mobitz 1 and 2:1 AV block while asleep  Physical Exam: Blood pressure 107/61, pulse 71, temperature 97.7 F (36.5 C), temperature source Oral, resp. rate 16, height 5\' 9"  (1.753 m), weight 152 lb 1.9 oz (69 kg), SpO2 91.00%. General: Well developed elderly WM in no acute distress. Head: Normal Neck: Supple Lungs: CTA Heart: RRR  Abdomen: Soft, non-tender, non-distended  Extremities: No edema. S/P injection Right knee Neuro: Alert and oriented X 3. Moves all extremities spontaneously.    Intake/Output Summary (Last 24 hours) at 09/04/13 0848 Last data filed at 09/03/13 1420  Gross per 24 hour  Intake    480 ml  Output    200 ml  Net    280 ml    Inpatient Medications:  . amLODipine  10 mg Oral Daily  . aspirin  325 mg Oral Daily  . atorvastatin  80 mg Oral Daily  . heparin subcutaneous  5,000 Units Subcutaneous 3 times per day  . methocarbamol  500 mg Oral TID  . multivitamin with minerals  1 tablet Oral Daily  . omega-3 acid ethyl esters  1 g Oral Daily  . pantoprazole  40 mg Oral Daily  . ramipril  10 mg Oral Q breakfast  . vitamin C  500 mg Oral BID   Infusions:    Labs:  Recent Labs  09/03/13 0410  NA 140  K 4.3  CL 103  CO2 25  GLUCOSE 106*  BUN 28*  CREATININE 1.10  CALCIUM 8.9   No results found for this basename: AST, ALT, ALKPHOS, BILITOT, PROT, ALBUMIN,  in the last 72 hours  Recent Labs  09/03/13 0410  WBC 12.8*  HGB 11.7*  HCT 35.5*  MCV 92.2  PLT 203   No results found for this basename: CKTOTAL, CKMB, TROPONINI,  in the last 72 hours No components found with this basename: POCBNP,  No results found for this basename: HGBA1C,  in the last 72 hours   Radiology/Studies:  Dg Thoracic Spine 2 View  08/31/2013   CLINICAL DATA:  Thoracic pain for 5 days question twist injury   EXAM: THORACIC SPINE - 2 VIEW  COMPARISON:  Chest radiographs 08/09/2011  FINDINGS: Diffuse osseous demineralization.  12 pairs of ribs.  Vertebral body and disk space heights maintained.  No definite fracture, subluxation or bone destruction.  Visualized posterior ribs intact.  Streaky atelectasis medial LEFT lower lobe.  IMPRESSION: Osseous demineralization.  No definite acute thoracic spine abnormalities.   Electronically Signed   By: Lavonia Dana M.D.   On: 08/31/2013 23:00   Dg Lumbar Spine 2-3 Views  08/31/2013   CLINICAL DATA:  Low back pain for 5 days, question twist injury  EXAM: LUMBAR SPINE - 2-3 VIEW  COMPARISON:  None  FINDINGS: Five non-rib-bearing lumbar vertebrae.  Diffuse osseous demineralization.  Vertebral body and disk space heights maintained.  No acute fracture, subluxation or bone destruction.  Atherosclerotic calcifications aorta with LEFT iliac stent noted.  SI joints symmetric.  Numerous EKG leads project over abdomen.  IMPRESSION: Osseous demineralization.  No acute bony findings.   Electronically Signed   By: Lavonia Dana M.D.   On: 08/31/2013 22:57   Dg Pelvis 1-2 Views  08/31/2013   CLINICAL DATA:  RIGHT hip pain for 5 days  EXAM: PELVIS - 1-2 VIEW  COMPARISON:  None  FINDINGS: Osseous demineralization.  Hip and SI joint spaces symmetric and preserved.  No acute fracture, dislocation or bone destruction.  LEFT pelvic phleboliths and LEFT iliac stent noted.  IMPRESSION: Osseous demineralization.  No acute bony findings.   Electronically Signed   By: Lavonia Dana M.D.   On: 08/31/2013 23:00   Ct Angio Abdomen W/cm &/or Wo Contrast  09/01/2013   CLINICAL DATA:  Severe back pain; history of abdominal aortic aneurysm.  EXAM: CT ANGIOGRAPHY ABDOMEN  TECHNIQUE: Multidetector CT imaging of the abdomen was performed using the standard protocol during bolus administration of intravenous contrast. Multiplanar reconstructed images including MIPs were obtained and reviewed to evaluate the  vascular anatomy.  CONTRAST:  22mL OMNIPAQUE IOHEXOL 350 MG/ML SOLN  COMPARISON:  CT of the abdomen and pelvis performed 05/24/2011  FINDINGS: Scarring and bronchiectasis are noted at the lung bases, with underlying emphysematous change.  There is no evidence of abdominal aortic aneurysm. Diffuse calcification is seen along the abdominal aorta and its branches, with associated mild intramural thrombus but no significant luminal narrowing. There is ectasia of the abdominal aorta, measuring up to 2.8 cm in AP dimension. The aortic bifurcation is grossly unremarkable in appearance.  The celiac trunk is unremarkable in appearance; the splenic artery is somewhat diminutive, with minimal calcification. Mild calcification is noted along the proximal superior mesenteric artery. There is calcification at the origins of both renal arteries, though a small accessory left-sided renal artery is also seen. The inferior mesenteric artery remains patent.  Scattered calcified granulomata are seen within the liver. A vague 1.5 cm focus of contrast blush within the medial right hepatic lobe is nonspecific. The liver is otherwise grossly unremarkable. The spleen is within normal limits. The gallbladder is mildly distended but otherwise unremarkable. The pancreatic duct appears mildly prominent, measuring up to 4 mm; is of uncertain significance, without evidence of distal obstruction. Mild bilateral adrenal prominence could reflect mild adrenal hyperplasia.  A large 5.1 cm cyst is noted at the interpole region of the left kidney. The kidneys are otherwise grossly unremarkable in appearance. There is no evidence of hydronephrosis. No renal or ureteral stones are seen. No perinephric stranding is appreciated.  No free fluid is identified. The visualized portions of the small bowel are unremarkable in appearance. The stomach is within normal limits. No acute vascular abnormalities are seen.  The appendix is only partially characterized  but appears normal in caliber, without evidence for appendicitis. The visualized portions of the colon are filled with stool and grossly unremarkable in appearance.  No acute osseous abnormalities are identified.  Review of the MIP images confirms the above findings.  IMPRESSION: 1. No evidence of abdominal aortic aneurysm. There is ectasia of the abdominal aorta to 2.8 cm in AP dimension, without evidence of aneurysmal dilatation. Diffuse calcification along the abdominal aorta and its branches, with associated mild intramural thrombus but no significant luminal narrowing. 2. Calcification particularly at the origins of both renal arteries. 3. Vague nonspecific 1.5 cm focus of contrast blush at the medial right hepatic lobe, not visualized on the prior study. Dynamic liver protocol MRI or CT could be considered for further evaluation, when the patient is able to hold his breath for the study. 4. 5.1 cm left renal cyst noted. 5. Nonspecific mild prominence of the pancreatic duct, measuring up to 4 mm. No evidence of distal obstruction. This is of uncertain significance, and slightly more prominent than in 2013. 6. Mild bilateral adrenal prominence could reflect  mild adrenal hyperplasia. 7. Scarring and bronchiectasis at the lung bases, with underlying emphysematous change.   Electronically Signed   By: Garald Balding M.D.   On: 09/01/2013 02:25   Dg Knee Complete 4 Views Right  08/31/2013   CLINICAL DATA:  RIGHT anterior knee pain for 5 days  EXAM: RIGHT KNEE - COMPLETE 4+ VIEW  COMPARISON:  None  FINDINGS: Osseous demineralization.  Minimal medial compartment joint space narrowing.  No acute fracture, dislocation, or bone destruction.  No knee joint effusion.  Small patellar spur at quadriceps tendon insertion.  Scattered atherosclerotic calcification at popliteal artery.  IMPRESSION: Osseous demineralization with minimal degenerative changes.  No acute abnormalities.   Electronically Signed   By: Lavonia Dana  M.D.   On: 08/31/2013 22:58     Assessment and Plan  1. Asymptomatic bradycardia- Mobitz 1 and 2:1 AV block, felt to be in the setting of pt sleeping. Not on any AV nodal blocking agents. He has been asymptomatic with this. TSH wnl. No indication at this point for pacemaker. FU with me 4-6 weeks after DC. Would DC telemetry. 2. Knee pain - Felt to be muskuloskeletal; management per primary care and orthopedics. 3. CAD s/p PCI of LAD in 1998, RCA 2011, low risk myoview 2013 - continue ASA and statin.  4. Hypertension -continue altace 5.. PVD with L CIA stent 2013, RAS s/p bilat stents, AAA (2.6x2.7 in 12/2012 - due 12/2013 - CTA with 2.8cm ectasia) - f/u as OP. Pulses in tact and symptoms not felt to be related to PAD this admission. 6. Ancillary CT findings - vague nonspecific 1.5 cm focus of contrast blush at the medial right hepatic lobe, 5.1cm L renal cyst, mild prominence of pancreatic duct - will defer to IM.  Signed, Lelon Perla

## 2013-09-04 NOTE — Care Management Note (Signed)
    Page 1 of 2   09/06/2013     3:40:58 PM CARE MANAGEMENT NOTE 09/06/2013  Patient:  Kristopher [name], Kristopher [name]   Account Number:  [id number]  Date Initiated:  09/04/2013  Documentation initiated by:  AMERSON,JULIE  Subjective/Objective Assessment:   Pt adm on 08/31/13 with back and rt knee pain.  PTA, pt lives at home with girlfriend.     Action/Plan:   PT eval pending.  Will follow for dc needs as pt progresses.   Anticipated DC Date:  09/05/2013   Anticipated DC Plan:  [place]  CM consult      [facility] Choice  HOME HEALTH   Choice offered to / List presented to:  C-1 Patient   DME arranged  Vassie Moselle      DME agency  [place] arranged  [place].   Status of service:  Completed, signed off Medicare Important Message given?  YES (If response is "NO", the following Medicare IM given date fields will be blank) Date Medicare IM given:   Date Additional Medicare IM given:  09/04/2013  Discharge Disposition:  [place]  Per UR Regulation:  Reviewed for med. necessity/level of care/duration of stay  If discussed at [place] of Stay Meetings, dates discussed:    Comments:  09/06/13 14:15 CM met with pt in room who states he has a 3n1 at home but was unsure of the walker.  Pt states [facility] is fine for HHPT.  Address and contact information verified with pt.  Referral called to [facility] rep, Stephanie for HHPT. Rolling walker to be delivered to room prior to discharge. No other CM needs were communicated.  Mariane Masters, BSN, CM [phone].

## 2013-09-04 NOTE — Progress Notes (Signed)
Patient ID: Kristopher [name], male   DOB: 09/02/1930, 80 y.o.   MRN: [id number]     Subjective:  Patient reports pain as mild.  Patient states that he is much better after the injection.  He still has some pain on the medial side but this is improved  Objective:   VITALS:   Filed Vitals:   09/03/13 0523 09/03/13 1419 09/03/13 2049 09/04/13 0518  BP:  106/67 120/67 107/61  Pulse:  90 80 71  Temp:  98 F (36.7 C) 98.1 F (36.7 C) 97.7 F (36.5 C)  TempSrc:  Oral Oral Oral  Resp: 18 18 18 16   Height:      Weight:      SpO2: 91% 93% 91% 91%    ABD soft Sensation intact distally Dorsiflexion/Plantar flexion intact Knee flexion 0-110 Stable ligaments throughout  Tender to palpation on the medial side   Lab Results  Component Value Date   WBC 12.8* 09/03/2013   HGB 11.7* 09/03/2013   HCT 35.5* 09/03/2013   MCV 92.2 09/03/2013   PLT 203 09/03/2013     Assessment/Plan:     Principal Problem:   Low back pain Active Problems:   HYPERTENSION   CAD   COPD (chronic obstructive pulmonary disease)   AV block, 2nd degree   Advance diet Up with therapy Continue plan per medicine WBAT Patient should follow up with Dr [name] in two weeks Will be available for further consults as needed Ortho signing off   Joya Gaskins 09/04/2013, 7:23 AM   [name] MD [phone]

## 2013-09-04 NOTE — Progress Notes (Signed)
Patient ID: Kristopher [name]  male  URK:[id number]    DOB: 1930-07-12    DOA: 08/31/2013  PCP: [name], MD  Admit HPI / Brief Narrative:  80 y.o. male w/ a history of CAD status post stenting, peripheral arterial disease status post stenting, renal artery stenosis status post stenting, hypertension, COPD, and abdominal aortic aneurysm who was brought to the ER with increasing low back pain over one week. Had developed low back pain 7 days prior after he was trying to get into a truck. 3 days later his pain worsened after he carried heavy weight. Pain mostly in lower back radiating to the right ankle.  In the ER the patient was given multiple doses of pain medications and Ativan. Patient did not have any incontinence of urine or bowel. He was incidentally found to have second-degree AV block.    Assessment/Plan:  Cardiology d/c'd telemetry. Knee pain slightly improved. Hesitant to work with PT.  Will get PT eval and likely dishcharge in am   DVT Prophylaxis:  Code Status: full  Family Communication: no family at bedside  Disposition:  Consultants:  Cardiology  Orthopedics, Dr. Percell Miller called today  Procedures:  None   Antibiotics:  None    Subjective: Pain improving, but cant bear weight.  Objective: Weight change:   Intake/Output Summary (Last 24 hours) at 09/04/13 1354 Last data filed at 09/04/13 0850  Gross per 24 hour  Intake    480 ml  Output    350 ml  Net    130 ml   Blood pressure 107/61, pulse 71, temperature 97.7 F (36.5 C), temperature source Oral, resp. rate 16, height 5\' 9"  (1.753 m), weight 69 kg (152 lb 1.9 oz), SpO2 91.00%.  Physical Exam: General: Alert and awake, oriented x3, not in any acute distress. CVS: S1-S2 clear, no murmur rubs or gallops Chest: clear to auscultation bilaterally, no wheezing, rales or rhonchi Abdomen: soft nontender, nondistended, normal bowel sounds  Extremities: kne without warmth. Full ROM minimal tenderness.  Minimal effusion. Neuro: Cranial nerves II-XII intact, no focal neurological deficits  Lab Results: Basic Metabolic Panel:  Recent Labs Lab 09/01/13 0230 09/03/13 0410  NA 141 140  K 4.6 4.3  CL 106 103  CO2 22 25  GLUCOSE 158* 106*  BUN 27* 28*  CREATININE 1.13 1.10  CALCIUM 9.0 8.9  MG 1.9  --    Liver Function Tests:  Recent Labs Lab 09/01/13 0230  AST 25  ALT 21  ALKPHOS 83  BILITOT 0.4  PROT 6.3  ALBUMIN 3.4*   No results found for this basename: LIPASE, AMYLASE,  in the last 168 hours No results found for this basename: AMMONIA,  in the last 168 hours CBC:  Recent Labs Lab 09/01/13 0230 09/03/13 0410  WBC 4.8 12.8*  NEUTROABS 4.1  --   HGB 13.3 11.7*  HCT 39.9 35.5*  MCV 91.5 92.2  PLT 222 203   Cardiac Enzymes:  Recent Labs Lab 09/01/13 0230  TROPONINI <0.30   BNP: No components found with this basename: POCBNP,  CBG:  Recent Labs Lab 09/01/13 1613 09/01/13 2106 09/02/13 0605 09/02/13 1115  GLUCAP 96 113* 112* 94     Micro Results: Recent Results (from the past 240 hour(s))  MRSA PCR SCREENING     Status: None   Collection Time    09/01/13  4:38 AM      Result Value Ref Range Status   MRSA by PCR NEGATIVE  NEGATIVE Final  Comment:            The GeneXpert MRSA Assay (FDA     approved for NASAL specimens     only), is one component of a     comprehensive MRSA colonization     surveillance program. It is not     intended to diagnose MRSA     infection nor to guide or     monitor treatment for     MRSA infections.    Studies/Results: Dg Thoracic Spine 2 View  08/31/2013   CLINICAL DATA:  Thoracic pain for 5 days question twist injury  EXAM: THORACIC SPINE - 2 VIEW  COMPARISON:  Chest radiographs 08/09/2011  FINDINGS: Diffuse osseous demineralization.  12 pairs of ribs.  Vertebral body and disk space heights maintained.  No definite fracture, subluxation or bone destruction.  Visualized posterior ribs intact.  Streaky  atelectasis medial LEFT lower lobe.  IMPRESSION: Osseous demineralization.  No definite acute thoracic spine abnormalities.   Electronically Signed   By: Lavonia Dana M.D.   On: 08/31/2013 23:00   Dg Lumbar Spine 2-3 Views  08/31/2013   CLINICAL DATA:  Low back pain for 5 days, question twist injury  EXAM: LUMBAR SPINE - 2-3 VIEW  COMPARISON:  None  FINDINGS: Five non-rib-bearing lumbar vertebrae.  Diffuse osseous demineralization.  Vertebral body and disk space heights maintained.  No acute fracture, subluxation or bone destruction.  Atherosclerotic calcifications aorta with LEFT iliac stent noted.  SI joints symmetric.  Numerous EKG leads project over abdomen.  IMPRESSION: Osseous demineralization.  No acute bony findings.   Electronically Signed   By: Lavonia Dana M.D.   On: 08/31/2013 22:57   Dg Pelvis 1-2 Views  08/31/2013   CLINICAL DATA:  RIGHT hip pain for 5 days  EXAM: PELVIS - 1-2 VIEW  COMPARISON:  None  FINDINGS: Osseous demineralization.  Hip and SI joint spaces symmetric and preserved.  No acute fracture, dislocation or bone destruction.  LEFT pelvic phleboliths and LEFT iliac stent noted.  IMPRESSION: Osseous demineralization.  No acute bony findings.   Electronically Signed   By: Lavonia Dana M.D.   On: 08/31/2013 23:00   Ct Angio Abdomen W/cm &/or Wo Contrast  09/01/2013   CLINICAL DATA:  Severe back pain; history of abdominal aortic aneurysm.  EXAM: CT ANGIOGRAPHY ABDOMEN  TECHNIQUE: Multidetector CT imaging of the abdomen was performed using the standard protocol during bolus administration of intravenous contrast. Multiplanar reconstructed images including MIPs were obtained and reviewed to evaluate the vascular anatomy.  CONTRAST:  49mL OMNIPAQUE IOHEXOL 350 MG/ML SOLN  COMPARISON:  CT of the abdomen and pelvis performed 05/24/2011  FINDINGS: Scarring and bronchiectasis are noted at the lung bases, with underlying emphysematous change.  There is no evidence of abdominal aortic aneurysm.  Diffuse calcification is seen along the abdominal aorta and its branches, with associated mild intramural thrombus but no significant luminal narrowing. There is ectasia of the abdominal aorta, measuring up to 2.8 cm in AP dimension. The aortic bifurcation is grossly unremarkable in appearance.  The celiac trunk is unremarkable in appearance; the splenic artery is somewhat diminutive, with minimal calcification. Mild calcification is noted along the proximal superior mesenteric artery. There is calcification at the origins of both renal arteries, though a small accessory left-sided renal artery is also seen. The inferior mesenteric artery remains patent.  Scattered calcified granulomata are seen within the liver. A vague 1.5 cm focus of contrast blush within the medial right hepatic  lobe is nonspecific. The liver is otherwise grossly unremarkable. The spleen is within normal limits. The gallbladder is mildly distended but otherwise unremarkable. The pancreatic duct appears mildly prominent, measuring up to 4 mm; is of uncertain significance, without evidence of distal obstruction. Mild bilateral adrenal prominence could reflect mild adrenal hyperplasia.  A large 5.1 cm cyst is noted at the interpole region of the left kidney. The kidneys are otherwise grossly unremarkable in appearance. There is no evidence of hydronephrosis. No renal or ureteral stones are seen. No perinephric stranding is appreciated.  No free fluid is identified. The visualized portions of the small bowel are unremarkable in appearance. The stomach is within normal limits. No acute vascular abnormalities are seen.  The appendix is only partially characterized but appears normal in caliber, without evidence for appendicitis. The visualized portions of the colon are filled with stool and grossly unremarkable in appearance.  No acute osseous abnormalities are identified.  Review of the MIP images confirms the above findings.  IMPRESSION: 1. No  evidence of abdominal aortic aneurysm. There is ectasia of the abdominal aorta to 2.8 cm in AP dimension, without evidence of aneurysmal dilatation. Diffuse calcification along the abdominal aorta and its branches, with associated mild intramural thrombus but no significant luminal narrowing. 2. Calcification particularly at the origins of both renal arteries. 3. Vague nonspecific 1.5 cm focus of contrast blush at the medial right hepatic lobe, not visualized on the prior study. Dynamic liver protocol MRI or CT could be considered for further evaluation, when the patient is able to hold his breath for the study. 4. 5.1 cm left renal cyst noted. 5. Nonspecific mild prominence of the pancreatic duct, measuring up to 4 mm. No evidence of distal obstruction. This is of uncertain significance, and slightly more prominent than in 2013. 6. Mild bilateral adrenal prominence could reflect mild adrenal hyperplasia. 7. Scarring and bronchiectasis at the lung bases, with underlying emphysematous change.   Electronically Signed   By: Garald Balding M.D.   On: 09/01/2013 02:25   Dg Knee Complete 4 Views Right  08/31/2013   CLINICAL DATA:  RIGHT anterior knee pain for 5 days  EXAM: RIGHT KNEE - COMPLETE 4+ VIEW  COMPARISON:  None  FINDINGS: Osseous demineralization.  Minimal medial compartment joint space narrowing.  No acute fracture, dislocation, or bone destruction.  No knee joint effusion.  Small patellar spur at quadriceps tendon insertion.  Scattered atherosclerotic calcification at popliteal artery.  IMPRESSION: Osseous demineralization with minimal degenerative changes.  No acute abnormalities.   Electronically Signed   By: Lavonia Dana M.D.   On: 08/31/2013 22:58    Medications: Scheduled Meds: . amLODipine  10 mg Oral Daily  . aspirin  325 mg Oral Daily  . atorvastatin  80 mg Oral Daily  . heparin subcutaneous  5,000 Units Subcutaneous 3 times per day  . methocarbamol  500 mg Oral TID  . multivitamin with  minerals  1 tablet Oral Daily  . omega-3 acid ethyl esters  1 g Oral Daily  . pantoprazole  40 mg Oral Daily  . ramipril  10 mg Oral Q breakfast  . vitamin C  500 mg Oral BID      LOS: 4 days   Delfina Redwood  MD [facility] 09/04/2013, 1:54 PM   If 7PM-7AM, please contact night-coverage [email or web address] Password [email or web address]

## 2013-09-05 DIAGNOSIS — M25569 Pain in unspecified knee: Secondary | ICD-10-CM | POA: Diagnosis not present

## 2013-09-05 DIAGNOSIS — M549 Dorsalgia, unspecified: Secondary | ICD-10-CM | POA: Diagnosis not present

## 2013-09-05 NOTE — Progress Notes (Signed)
Physical Therapy Treatment Patient Details Name: Kristopher [name] MRN: [id number] DOB: 22-May-1930 Today's Date: 09/05/2013    History of Present Illness 80 y.o. male history of CAD status post stenting, peripheral arterial disease status post stenting, renal artery stenosis status post stenting, hypertension, COPD, abdominal aortic aneurysm was brought to the ER as patient had increasing low back pain over the last one week. Patient was found to have second-degree AV block. Pt's pain primarily in rt knee and xray negative. Rt knee injected by Dr. Fredonia Highland.    PT Comments    Pain in rt leg slightly better but still unable to bear weight on it. Pt reporting lower leg changing colors in dependent position but I could not see any visible changes. Pt is able to manage amb short distances with the walker.  Follow Up Recommendations  Home health PT;Supervision - Intermittent     Equipment Recommendations  Rolling walker with 5" wheels    Recommendations for Other Services       Precautions / Restrictions Precautions Precautions: None    Mobility  Bed Mobility Overal bed mobility: Modified Independent             General bed mobility comments: Able to move rt leg off of bed without assisting it with his hands but used hands to assist rt leg back up into bed.  Transfers Overall transfer level: Modified independent Equipment used: Rolling walker (2 wheeled)             General transfer comment: Minimal wt bearing on rt leg.  Ambulation/Gait Ambulation/Gait assistance: Modified independent (Device/Increase time) Ambulation Distance (Feet): 20 Feet Assistive device: Rolling walker (2 wheeled) Gait Pattern/deviations:  (hop to)     General Gait Details: Pt with almost all of his weight on lt legs. Only touch down weight for balance on rt leg.   Stairs            Wheelchair Mobility    Modified Rankin (Stroke Patients Only)       Balance Overall balance  assessment: No apparent balance deficits (not formally assessed)                                  Cognition Arousal/Alertness: Awake/alert Behavior During Therapy: WFL for tasks assessed/performed Overall Cognitive Status: Within Functional Limits for tasks assessed                      Exercises General Exercises - Lower Extremity Quad Sets: AROM;Both;5 reps;Supine    General Comments        Pertinent Vitals/Pain Pain in rt knee/leg. Pt premedicated.    Home Living                      Prior Function            PT Goals (current goals can now be found in the care plan section) Progress towards PT goals: Progressing toward goals    Frequency  Min 3X/week    PT Plan Frequency needs to be updated    Co-evaluation             End of Session   Activity Tolerance: Patient limited by pain Patient left: in bed;with call bell/phone within reach     Time: 1051-1059 PT Time Calculation (min): 8 min  Charges:  $Gait Training: 8-22 mins  G Codes:      [place] 09/05/2013, 12:01 PM  [place]

## 2013-09-05 NOTE — Progress Notes (Signed)
D/w PT.    Patient ID: Kristopher [name]  male  PNT:[id number]    DOB: [birth year]    DOA: 08/31/2013  PCP: [name], MD  Admit HPI / Brief Narrative:  80 y.o. male w/ a history of CAD status post stenting, peripheral arterial disease status post stenting, renal artery stenosis status post stenting, hypertension, COPD, and abdominal aortic aneurysm who was brought to the ER with increasing low back pain over one week. Had developed low back pain 7 days prior after he was trying to get into a truck. 3 days later his pain worsened after he carried heavy weight. Pain mostly in lower back radiating to the right ankle.  In the ER the patient was given multiple doses of pain medications and Ativan. Patient did not have any incontinence of urine or bowel. He was incidentally found to have second-degree AV block.    Assessment/Plan:  Cardiology d/c'd telemetry. Knee pain slightly improved. Still unable to bear weight. Will get MRI.    DVT Prophylaxis:  Code Status: full  Family Communication: no family at bedside  Disposition:  Consultants:  Cardiology  Orthopedics, Dr. Percell Miller called today  Procedures:  None   Antibiotics:  None    Subjective: Pain improving, but cant bear weight.  Objective: Weight change:   Intake/Output Summary (Last 24 hours) at 09/05/13 1126 Last data filed at 09/05/13 0414  Gross per 24 hour  Intake    480 ml  Output   2175 ml  Net  -1695 ml   Blood pressure 141/83, pulse 67, temperature 98 F (36.7 C), temperature source Oral, resp. rate 18, height 5\' 9"  (1.753 m), weight 69 kg (152 lb 1.9 oz), SpO2 92.00%.  Physical Exam: General: Alert and awake, oriented x3, not in any acute distress. CVS: S1-S2 clear, no murmur rubs or gallops Chest: clear to auscultation bilaterally, no wheezing, rales or rhonchi Abdomen: soft nontender, nondistended, normal bowel sounds  Extremities: kne without warmth. Full ROM minimal tenderness. Minimal  effusion. Neuro: Cranial nerves II-XII intact, no focal neurological deficits  Lab Results: Basic Metabolic Panel:  Recent Labs Lab 09/01/13 0230 09/03/13 0410  NA 141 140  K 4.6 4.3  CL 106 103  CO2 22 25  GLUCOSE 158* 106*  BUN 27* 28*  CREATININE 1.13 1.10  CALCIUM 9.0 8.9  MG 1.9  --    Liver Function Tests:  Recent Labs Lab 09/01/13 0230  AST 25  ALT 21  ALKPHOS 83  BILITOT 0.4  PROT 6.3  ALBUMIN 3.4*   No results found for this basename: LIPASE, AMYLASE,  in the last 168 hours No results found for this basename: AMMONIA,  in the last 168 hours CBC:  Recent Labs Lab 09/01/13 0230 09/03/13 0410  WBC 4.8 12.8*  NEUTROABS 4.1  --   HGB 13.3 11.7*  HCT 39.9 35.5*  MCV 91.5 92.2  PLT 222 203   Cardiac Enzymes:  Recent Labs Lab 09/01/13 0230  TROPONINI <0.30   BNP: No components found with this basename: POCBNP,  CBG:  Recent Labs Lab 09/01/13 1613 09/01/13 2106 09/02/13 0605 09/02/13 1115  GLUCAP 96 113* 112* 94     Micro Results: Recent Results (from the past 240 hour(s))  MRSA PCR SCREENING     Status: None   Collection Time    09/01/13  4:38 AM      Result Value Ref Range Status   MRSA by PCR NEGATIVE  NEGATIVE Final   Comment:  The GeneXpert MRSA Assay (FDA     approved for NASAL specimens     only), is one component of a     comprehensive MRSA colonization     surveillance program. It is not     intended to diagnose MRSA     infection nor to guide or     monitor treatment for     MRSA infections.    Studies/Results: Dg Thoracic Spine 2 View  08/31/2013   CLINICAL DATA:  Thoracic pain for 5 days question twist injury  EXAM: THORACIC SPINE - 2 VIEW  COMPARISON:  Chest radiographs 08/09/2011  FINDINGS: Diffuse osseous demineralization.  12 pairs of ribs.  Vertebral body and disk space heights maintained.  No definite fracture, subluxation or bone destruction.  Visualized posterior ribs intact.  Streaky atelectasis  medial LEFT lower lobe.  IMPRESSION: Osseous demineralization.  No definite acute thoracic spine abnormalities.   Electronically Signed   By: Lavonia Dana M.D.   On: 08/31/2013 23:00   Dg Lumbar Spine 2-3 Views  08/31/2013   CLINICAL DATA:  Low back pain for 5 days, question twist injury  EXAM: LUMBAR SPINE - 2-3 VIEW  COMPARISON:  None  FINDINGS: Five non-rib-bearing lumbar vertebrae.  Diffuse osseous demineralization.  Vertebral body and disk space heights maintained.  No acute fracture, subluxation or bone destruction.  Atherosclerotic calcifications aorta with LEFT iliac stent noted.  SI joints symmetric.  Numerous EKG leads project over abdomen.  IMPRESSION: Osseous demineralization.  No acute bony findings.   Electronically Signed   By: Lavonia Dana M.D.   On: 08/31/2013 22:57   Dg Pelvis 1-2 Views  08/31/2013   CLINICAL DATA:  RIGHT hip pain for 5 days  EXAM: PELVIS - 1-2 VIEW  COMPARISON:  None  FINDINGS: Osseous demineralization.  Hip and SI joint spaces symmetric and preserved.  No acute fracture, dislocation or bone destruction.  LEFT pelvic phleboliths and LEFT iliac stent noted.  IMPRESSION: Osseous demineralization.  No acute bony findings.   Electronically Signed   By: Lavonia Dana M.D.   On: 08/31/2013 23:00   Ct Angio Abdomen W/cm &/or Wo Contrast  09/01/2013   CLINICAL DATA:  Severe back pain; history of abdominal aortic aneurysm.  EXAM: CT ANGIOGRAPHY ABDOMEN  TECHNIQUE: Multidetector CT imaging of the abdomen was performed using the standard protocol during bolus administration of intravenous contrast. Multiplanar reconstructed images including MIPs were obtained and reviewed to evaluate the vascular anatomy.  CONTRAST:  32mL OMNIPAQUE IOHEXOL 350 MG/ML SOLN  COMPARISON:  CT of the abdomen and pelvis performed 05/24/2011  FINDINGS: Scarring and bronchiectasis are noted at the lung bases, with underlying emphysematous change.  There is no evidence of abdominal aortic aneurysm. Diffuse  calcification is seen along the abdominal aorta and its branches, with associated mild intramural thrombus but no significant luminal narrowing. There is ectasia of the abdominal aorta, measuring up to 2.8 cm in AP dimension. The aortic bifurcation is grossly unremarkable in appearance.  The celiac trunk is unremarkable in appearance; the splenic artery is somewhat diminutive, with minimal calcification. Mild calcification is noted along the proximal superior mesenteric artery. There is calcification at the origins of both renal arteries, though a small accessory left-sided renal artery is also seen. The inferior mesenteric artery remains patent.  Scattered calcified granulomata are seen within the liver. A vague 1.5 cm focus of contrast blush within the medial right hepatic lobe is nonspecific. The liver is otherwise grossly unremarkable. The spleen is  within normal limits. The gallbladder is mildly distended but otherwise unremarkable. The pancreatic duct appears mildly prominent, measuring up to 4 mm; is of uncertain significance, without evidence of distal obstruction. Mild bilateral adrenal prominence could reflect mild adrenal hyperplasia.  A large 5.1 cm cyst is noted at the interpole region of the left kidney. The kidneys are otherwise grossly unremarkable in appearance. There is no evidence of hydronephrosis. No renal or ureteral stones are seen. No perinephric stranding is appreciated.  No free fluid is identified. The visualized portions of the small bowel are unremarkable in appearance. The stomach is within normal limits. No acute vascular abnormalities are seen.  The appendix is only partially characterized but appears normal in caliber, without evidence for appendicitis. The visualized portions of the colon are filled with stool and grossly unremarkable in appearance.  No acute osseous abnormalities are identified.  Review of the MIP images confirms the above findings.  IMPRESSION: 1. No evidence of  abdominal aortic aneurysm. There is ectasia of the abdominal aorta to 2.8 cm in AP dimension, without evidence of aneurysmal dilatation. Diffuse calcification along the abdominal aorta and its branches, with associated mild intramural thrombus but no significant luminal narrowing. 2. Calcification particularly at the origins of both renal arteries. 3. Vague nonspecific 1.5 cm focus of contrast blush at the medial right hepatic lobe, not visualized on the prior study. Dynamic liver protocol MRI or CT could be considered for further evaluation, when the patient is able to hold his breath for the study. 4. 5.1 cm left renal cyst noted. 5. Nonspecific mild prominence of the pancreatic duct, measuring up to 4 mm. No evidence of distal obstruction. This is of uncertain significance, and slightly more prominent than in 2013. 6. Mild bilateral adrenal prominence could reflect mild adrenal hyperplasia. 7. Scarring and bronchiectasis at the lung bases, with underlying emphysematous change.   Electronically Signed   By: Garald Balding M.D.   On: 09/01/2013 02:25   Dg Knee Complete 4 Views Right  08/31/2013   CLINICAL DATA:  RIGHT anterior knee pain for 5 days  EXAM: RIGHT KNEE - COMPLETE 4+ VIEW  COMPARISON:  None  FINDINGS: Osseous demineralization.  Minimal medial compartment joint space narrowing.  No acute fracture, dislocation, or bone destruction.  No knee joint effusion.  Small patellar spur at quadriceps tendon insertion.  Scattered atherosclerotic calcification at popliteal artery.  IMPRESSION: Osseous demineralization with minimal degenerative changes.  No acute abnormalities.   Electronically Signed   By: Lavonia Dana M.D.   On: 08/31/2013 22:58    Medications: Scheduled Meds: . amLODipine  10 mg Oral Daily  . aspirin  325 mg Oral Daily  . atorvastatin  80 mg Oral Daily  . heparin subcutaneous  5,000 Units Subcutaneous 3 times per day  . methocarbamol  500 mg Oral TID  . multivitamin with minerals  1  tablet Oral Daily  . omega-3 acid ethyl esters  1 g Oral Daily  . pantoprazole  40 mg Oral Daily  . ramipril  10 mg Oral Q breakfast  . vitamin C  500 mg Oral BID      LOS: 5 days   Delfina Redwood  MD [facility] 09/05/2013, 11:26 AM   If 7PM-7AM, please contact night-coverage [email or web address] Password [email or web address]

## 2013-09-06 ENCOUNTER — Inpatient Hospital Stay (HOSPITAL_COMMUNITY): Payer: Medicare Other

## 2013-09-06 DIAGNOSIS — M25569 Pain in unspecified knee: Secondary | ICD-10-CM | POA: Diagnosis not present

## 2013-09-06 DIAGNOSIS — I441 Atrioventricular block, second degree: Secondary | ICD-10-CM | POA: Diagnosis not present

## 2013-09-06 DIAGNOSIS — IMO0002 Reserved for concepts with insufficient information to code with codable children: Secondary | ICD-10-CM | POA: Diagnosis not present

## 2013-09-06 MED ORDER — LORAZEPAM 2 MG/ML IJ SOLN
1.0000 mg | Freq: Once | INTRAMUSCULAR | Status: AC | PRN
  Administered 2013-09-06: 13:00:00 via INTRAVENOUS
  Filled 2013-09-06: qty 1

## 2013-09-06 MED ORDER — ACETAMINOPHEN 325 MG PO TABS: 650.0000 mg | ORAL_TABLET | Freq: Four times a day (QID) | ORAL | Status: DC | PRN

## 2013-09-06 MED ORDER — PROMETHAZINE HCL 25 MG/ML IJ SOLN
12.5000 mg | Freq: Once | INTRAMUSCULAR | Status: AC
  Administered 2013-09-06: 12.5 mg via INTRAVENOUS
  Filled 2013-09-06: qty 1

## 2013-09-06 MED ORDER — HYDROMORPHONE HCL PF 1 MG/ML IJ SOLN: 1.0000 mg | Freq: Once | INTRAMUSCULAR | Status: DC | PRN

## 2013-09-06 MED ORDER — HYDROMORPHONE HCL PF 1 MG/ML IJ SOLN
1.0000 mg | Freq: Once | INTRAMUSCULAR | Status: AC
  Administered 2013-09-06: 1 mg via INTRAVENOUS
  Filled 2013-09-06: qty 1

## 2013-09-06 NOTE — Progress Notes (Signed)
Patient began vomiting early this morning.  Zofran was ineffective, so notified MD on-call.  Received order for 1X dose of Phenergan 12.5 mg IV.  Patient rested for some time following this dose and woke up feeling better this morning.  Kristopher [name] Patient

## 2013-09-06 NOTE — Discharge Summary (Signed)
Physician Discharge Summary  Kristopher [name] NIO:[id number] DOB: [birth year] DOA: 08/31/2013  PCP: [name], MD  Admit date: 08/31/2013 Discharge date: 09/06/2013  Time spent: greater than 30 minutes  Recommendations for Outpatient Follow-up:  1. Home PT  Discharge Diagnoses:  Principal Problem:   Low back pain Active Problems:    HYPERTENSION   CAD   COPD (chronic obstructive pulmonary disease)   AV block, 2nd degree   Discharge Condition: *stable  Filed Weights   08/31/13 1353 08/31/13 1526 09/03/13 0449  Weight: 68.04 kg (150 lb) 68.04 kg (150 lb) 69 kg (152 lb 1.9 oz)    History of present illness:  80 y.o. male w/ a history of CAD status post stenting, peripheral arterial disease status post stenting, renal artery stenosis status post stenting, hypertension, COPD, and abdominal aortic aneurysm who was brought to the ER with increasing low back pain over one week. Had developed low back pain 7 days prior after he was trying to get into a truck. 3 days later his pain worsened after he carried heavy weight. Pain mostly in lower back radiating to the right ankle.  In the ER the patient was given multiple doses of pain medications and Ativan. Patient did not have any incontinence of urine or bowel. He was incidentally found to have mobitz 1 and 2:1 AV block  Patient was admitted to telemetry.  Cardiology consulted and noted that as patient is asymptomatic, and occurred mainly with sleep, no pacemaker indicated.  TSH normal.  When moved to the floor, patient complained mainly of right knee pain. Unable to bear weight.  Ortho consulted and injected the knee.  xrays negative.  Pain improved somewhat, but pt still having difficulty ambulating. MRI done, which showed arthritis, no fracture meniscal or ligamentous injury. Pt has worked with PT, OT, and home health will be arranged.  Procedures:  Right knee injection  Consultations:  [name]  cardiology  Discharge  Exam: Filed Vitals:   09/06/13 0454  BP:   Pulse:   Temp: 99.8 F (37.7 C)  Resp: 18    General: comfortable Cardiovascular: RRR Respiratory: CTA Ext right knee without deformity, warmth. Full ROM. Mild tenderness.  Discharge Instructions You were cared for by a hospitalist during your hospital stay. If you have any questions about your discharge medications or the care you received while you were in the hospital after you are discharged, you can call the unit and asked to speak with the hospitalist on call if the hospitalist that took care of you is not available. Once you are discharged, your primary care physician will handle any further medical issues. Please note that NO REFILLS for any discharge medications will be authorized once you are discharged, as it is imperative that you return to your primary care physician (or establish a relationship with a primary care physician if you do not have one) for your aftercare needs so that they can reassess your need for medications and monitor your lab values.  Discharge Instructions   Diet - low sodium heart healthy    Complete by:  As directed      Walk with assistance    Complete by:  As directed             Medication List         acetaminophen 325 MG tablet  Commonly known as:  TYLENOL  Take 2 tablets (650 mg total) by mouth every 6 (six) hours as needed for mild pain (or  Fever >/= 101).     aspirin 325 MG EC tablet  Take 325 mg by mouth daily.     atorvastatin 80 MG tablet  Commonly known as:  LIPITOR  Take 1 tablet (80 mg total) by mouth daily.     BEE POLLEN PO  Take by mouth.     benzonatate 100 MG capsule  Commonly known as:  TESSALON  1-2 tab by mouth every 6 hrs as needed for cough     Co-Enzyme Q-10 100 MG Caps  Take 1 capsule by mouth daily.     cyclobenzaprine 10 MG tablet  Commonly known as:  FLEXERIL  Take 1 tablet (10 mg total) by mouth 2 (two) times daily as needed for muscle spasms.     fish  oil-omega-3 fatty acids 1000 MG capsule  Take 1 g by mouth daily.     HYDROcodone-acetaminophen 5-325 MG per tablet  Commonly known as:  NORCO/VICODIN  Take 1 tablet by mouth 2 (two) times daily as needed. To fill September 14, 2013     Krill Oil 300 MG Caps  Take by mouth.     multivitamin capsule  Take 1 capsule by mouth daily.     nitroGLYCERIN 0.4 MG SL tablet  Commonly known as:  NITROSTAT  Place 1 tablet (0.4 mg total) under the tongue every 5 (five) minutes as needed for chest pain.     omeprazole 20 MG capsule  Commonly known as:  PRILOSEC  Take 20 mg by mouth 2 (two) times daily before a meal. 2 tabs by mouth per day     ramipril 10 MG capsule  Commonly known as:  ALTACE  Take 1 capsule (10 mg total) by mouth daily with breakfast.     vitamin C 500 MG tablet  Commonly known as:  ASCORBIC ACID  Take 500 mg by mouth 2 (two) times daily.     vitamin E 400 UNIT capsule  Take 400 Units by mouth daily.       Allergies  Allergen Reactions  . Influenza Vaccines Other (See Comments)    unknown  . Sulfa Antibiotics Rash       Follow-up Information   Follow up with [name], MD On 10/21/2013. (At 11:30am. Note: this is at the [facility] location, not [facility]. )    Specialty:  Cardiology   Contact information:   [address] [place] [address] [phone]       Follow up with [name], D, MD In 1 week.   Specialty:  Orthopedic Surgery   Contact information:   [address] [place] [address] [phone]        The results of significant diagnostics from this hospitalization (including imaging, microbiology, ancillary and laboratory) are listed below for reference.    Significant Diagnostic Studies: Dg Thoracic Spine 2 View  08/31/2013   CLINICAL DATA:  Thoracic pain for 5 days question twist injury  EXAM: THORACIC SPINE - 2 VIEW  COMPARISON:  Chest radiographs 08/09/2011  FINDINGS: Diffuse osseous  demineralization.  12 pairs of ribs.  Vertebral body and disk space heights maintained.  No definite fracture, subluxation or bone destruction.  Visualized posterior ribs intact.  Streaky atelectasis medial LEFT lower lobe.  IMPRESSION: Osseous demineralization.  No definite acute thoracic spine abnormalities.   Electronically Signed   By: Lavonia Dana M.D.   On: 08/31/2013 23:00   Dg Lumbar Spine 2-3 Views  08/31/2013   CLINICAL DATA:  Low back pain for 5 days, question twist injury  EXAM: LUMBAR SPINE - 2-3 VIEW  COMPARISON:  None  FINDINGS: Five non-rib-bearing lumbar vertebrae.  Diffuse osseous demineralization.  Vertebral body and disk space heights maintained.  No acute fracture, subluxation or bone destruction.  Atherosclerotic calcifications aorta with LEFT iliac stent noted.  SI joints symmetric.  Numerous EKG leads project over abdomen.  IMPRESSION: Osseous demineralization.  No acute bony findings.   Electronically Signed   By: Lavonia Dana M.D.   On: 08/31/2013 22:57   Dg Pelvis 1-2 Views  08/31/2013   CLINICAL DATA:  RIGHT hip pain for 5 days  EXAM: PELVIS - 1-2 VIEW  COMPARISON:  None  FINDINGS: Osseous demineralization.  Hip and SI joint spaces symmetric and preserved.  No acute fracture, dislocation or bone destruction.  LEFT pelvic phleboliths and LEFT iliac stent noted.  IMPRESSION: Osseous demineralization.  No acute bony findings.   Electronically Signed   By: Lavonia Dana M.D.   On: 08/31/2013 23:00   Ct Angio Abdomen W/cm &/or Wo Contrast  09/01/2013   CLINICAL DATA:  Severe back pain; history of abdominal aortic aneurysm.  EXAM: CT ANGIOGRAPHY ABDOMEN  TECHNIQUE: Multidetector CT imaging of the abdomen was performed using the standard protocol during bolus administration of intravenous contrast. Multiplanar reconstructed images including MIPs were obtained and reviewed to evaluate the vascular anatomy.  CONTRAST:  12mL OMNIPAQUE IOHEXOL 350 MG/ML SOLN  COMPARISON:  CT of the abdomen and  pelvis performed 05/24/2011  FINDINGS: Scarring and bronchiectasis are noted at the lung bases, with underlying emphysematous change.  There is no evidence of abdominal aortic aneurysm. Diffuse calcification is seen along the abdominal aorta and its branches, with associated mild intramural thrombus but no significant luminal narrowing. There is ectasia of the abdominal aorta, measuring up to 2.8 cm in AP dimension. The aortic bifurcation is grossly unremarkable in appearance.  The celiac trunk is unremarkable in appearance; the splenic artery is somewhat diminutive, with minimal calcification. Mild calcification is noted along the proximal superior mesenteric artery. There is calcification at the origins of both renal arteries, though a small accessory left-sided renal artery is also seen. The inferior mesenteric artery remains patent.  Scattered calcified granulomata are seen within the liver. A vague 1.5 cm focus of contrast blush within the medial right hepatic lobe is nonspecific. The liver is otherwise grossly unremarkable. The spleen is within normal limits. The gallbladder is mildly distended but otherwise unremarkable. The pancreatic duct appears mildly prominent, measuring up to 4 mm; is of uncertain significance, without evidence of distal obstruction. Mild bilateral adrenal prominence could reflect mild adrenal hyperplasia.  A large 5.1 cm cyst is noted at the interpole region of the left kidney. The kidneys are otherwise grossly unremarkable in appearance. There is no evidence of hydronephrosis. No renal or ureteral stones are seen. No perinephric stranding is appreciated.  No free fluid is identified. The visualized portions of the small bowel are unremarkable in appearance. The stomach is within normal limits. No acute vascular abnormalities are seen.  The appendix is only partially characterized but appears normal in caliber, without evidence for appendicitis. The visualized portions of the colon are  filled with stool and grossly unremarkable in appearance.  No acute osseous abnormalities are identified.  Review of the MIP images confirms the above findings.  IMPRESSION: 1. No evidence of abdominal aortic aneurysm. There is ectasia of the abdominal aorta to 2.8 cm in AP dimension, without evidence of aneurysmal  dilatation. Diffuse calcification along the abdominal aorta and its branches, with associated mild intramural thrombus but no significant luminal narrowing. 2. Calcification particularly at the origins of both renal arteries. 3. Vague nonspecific 1.5 cm focus of contrast blush at the medial right hepatic lobe, not visualized on the prior study. Dynamic liver protocol MRI or CT could be considered for further evaluation, when the patient is able to hold his breath for the study. 4. 5.1 cm left renal cyst noted. 5. Nonspecific mild prominence of the pancreatic duct, measuring up to 4 mm. No evidence of distal obstruction. This is of uncertain significance, and slightly more prominent than in 2013. 6. Mild bilateral adrenal prominence could reflect mild adrenal hyperplasia. 7. Scarring and bronchiectasis at the lung bases, with underlying emphysematous change.   Electronically Signed   By: Garald Balding M.D.   On: 09/01/2013 02:25   Mr Knee Right Wo Contrast  09/06/2013   CLINICAL DATA:  Right groin, thigh and knee pain with burning sensation. Twisting injury.  EXAM: MRI OF THE RIGHT KNEE WITHOUT CONTRAST  TECHNIQUE: Multiplanar, multisequence MR imaging of the knee was performed. No intravenous contrast was administered.  COMPARISON:  Radiographs 08/31/2013.  FINDINGS: The patient was unable to tolerate scanning and motion artifact is present on the evaluation degrading the images moderately. Coronal T1 images were not able to be obtained due to intolerance.  MENISCI  Medial meniscus: Poorly evaluated due to motion artifact. High signal is present in the posterior horn, most likely representing  degenerative disease based on T2 weighted images.  Lateral meniscus: Again, high signal is present in the posterior horn, likely due to motion.  LIGAMENTS  Cruciates:  Intact.  Collaterals:  Intact.  CARTILAGE  Patellofemoral: Moderate patellofemoral osteoarthritis is present with superior grade III apical chondromalacia. Subchondral edema in the patella.  Medial:  Normal.  Lateral:  Normal.  Joint:  Tiny effusion.  Popliteal Fossa:  Normal.  Extensor Mechanism:  Intact.  Bones:  No aggressive osseous lesion or fracture.  There is a cutaneous cystic lesion in the medial aspect of the knee overlying the medial patellar retinaculum measuring 6 mm x 22 mm on axial imaging. Craniocaudal this measures 17 mm. Although nonspecific, based on the location, this probably represents a dermal lesion such as a sebaceous cyst.  IMPRESSION: Moderate patellofemoral osteoarthritis. Moderately motion degraded examination without gross meniscal tear. No acute abnormality.   Electronically Signed   By: Dereck Ligas M.D.   On: 09/06/2013 13:22   Dg Knee Complete 4 Views Right  08/31/2013   CLINICAL DATA:  RIGHT anterior knee pain for 5 days  EXAM: RIGHT KNEE - COMPLETE 4+ VIEW  COMPARISON:  None  FINDINGS: Osseous demineralization.  Minimal medial compartment joint space narrowing.  No acute fracture, dislocation, or bone destruction.  No knee joint effusion.  Small patellar spur at quadriceps tendon insertion.  Scattered atherosclerotic calcification at popliteal artery.  IMPRESSION: Osseous demineralization with minimal degenerative changes.  No acute abnormalities.   Electronically Signed   By: Lavonia Dana M.D.   On: 08/31/2013 22:58    Microbiology: Recent Results (from the past 240 hour(s))  MRSA PCR SCREENING     Status: None   Collection Time    09/01/13  4:38 AM      Result Value Ref Range Status   MRSA by PCR NEGATIVE  NEGATIVE Final   Comment:            The GeneXpert MRSA Assay (FDA  approved for NASAL  specimens     only), is one component of a     comprehensive MRSA colonization     surveillance program. It is not     intended to diagnose MRSA     infection nor to guide or     monitor treatment for     MRSA infections.     Labs: Basic Metabolic Panel:  Recent Labs Lab 08/31/13 1645 09/01/13 0230 09/03/13 0410  NA 142 141 140  K 4.4 4.6 4.3  CL 106 106 103  CO2 24 22 25   GLUCOSE 96 158* 106*  BUN 24* 27* 28*  CREATININE 1.01 1.13 1.10  CALCIUM 9.5 9.0 8.9  MG  --  1.9  --    Liver Function Tests:  Recent Labs Lab 09/01/13 0230  AST 25  ALT 21  ALKPHOS 83  BILITOT 0.4  PROT 6.3  ALBUMIN 3.4*   No results found for this basename: LIPASE, AMYLASE,  in the last 168 hours No results found for this basename: AMMONIA,  in the last 168 hours CBC:  Recent Labs Lab 08/31/13 1645 09/01/13 0230 09/03/13 0410  WBC 8.1 4.8 12.8*  NEUTROABS 4.7 4.1  --   HGB 12.3* 13.3 11.7*  HCT 37.4* 39.9 35.5*  MCV 90.1 91.5 92.2  PLT 229 222 203   Cardiac Enzymes:  Recent Labs Lab 09/01/13 0230  TROPONINI <0.30   BNP: BNP (last 3 results) No results found for this basename: PROBNP,  in the last 8760 hours CBG:  Recent Labs Lab 09/01/13 1613 09/01/13 2106 09/02/13 0605 09/02/13 1115  GLUCAP 96 113* 112* 94       Signed:  [facility] 09/06/2013, 1:56 PM

## 2013-09-06 NOTE — Progress Notes (Signed)
[name] refused his morning vitals.  Will attempt to retry later.  Kristopher [name]

## 2013-09-10 DIAGNOSIS — M1710 Unilateral primary osteoarthritis, unspecified knee: Secondary | ICD-10-CM | POA: Diagnosis not present

## 2013-09-16 ENCOUNTER — Encounter: Payer: Self-pay | Admitting: Physical Medicine & Rehabilitation

## 2013-09-16 ENCOUNTER — Ambulatory Visit (HOSPITAL_BASED_OUTPATIENT_CLINIC_OR_DEPARTMENT_OTHER): Payer: Medicare Other | Admitting: Physical Medicine & Rehabilitation

## 2013-09-16 ENCOUNTER — Encounter: Payer: Medicare Other | Attending: Physical Medicine & Rehabilitation

## 2013-09-16 VITALS — BP 104/63 | HR 88 | Resp 14 | Ht 69.0 in | Wt 145.2 lb

## 2013-09-16 DIAGNOSIS — M25561 Pain in right knee: Secondary | ICD-10-CM

## 2013-09-16 DIAGNOSIS — I2510 Atherosclerotic heart disease of native coronary artery without angina pectoris: Secondary | ICD-10-CM | POA: Diagnosis not present

## 2013-09-16 DIAGNOSIS — I359 Nonrheumatic aortic valve disorder, unspecified: Secondary | ICD-10-CM | POA: Insufficient documentation

## 2013-09-16 DIAGNOSIS — M129 Arthropathy, unspecified: Secondary | ICD-10-CM | POA: Insufficient documentation

## 2013-09-16 DIAGNOSIS — I739 Peripheral vascular disease, unspecified: Secondary | ICD-10-CM | POA: Diagnosis not present

## 2013-09-16 DIAGNOSIS — J4489 Other specified chronic obstructive pulmonary disease: Secondary | ICD-10-CM | POA: Insufficient documentation

## 2013-09-16 DIAGNOSIS — G8929 Other chronic pain: Secondary | ICD-10-CM | POA: Insufficient documentation

## 2013-09-16 DIAGNOSIS — I252 Old myocardial infarction: Secondary | ICD-10-CM | POA: Diagnosis not present

## 2013-09-16 DIAGNOSIS — G894 Chronic pain syndrome: Secondary | ICD-10-CM | POA: Diagnosis not present

## 2013-09-16 DIAGNOSIS — Z79899 Other long term (current) drug therapy: Secondary | ICD-10-CM

## 2013-09-16 DIAGNOSIS — M545 Low back pain, unspecified: Secondary | ICD-10-CM | POA: Diagnosis not present

## 2013-09-16 DIAGNOSIS — I10 Essential (primary) hypertension: Secondary | ICD-10-CM | POA: Insufficient documentation

## 2013-09-16 DIAGNOSIS — J449 Chronic obstructive pulmonary disease, unspecified: Secondary | ICD-10-CM | POA: Insufficient documentation

## 2013-09-16 DIAGNOSIS — M5430 Sciatica, unspecified side: Secondary | ICD-10-CM | POA: Diagnosis not present

## 2013-09-16 DIAGNOSIS — M25569 Pain in unspecified knee: Secondary | ICD-10-CM | POA: Diagnosis not present

## 2013-09-16 DIAGNOSIS — Z5181 Encounter for therapeutic drug level monitoring: Secondary | ICD-10-CM

## 2013-09-16 DIAGNOSIS — E785 Hyperlipidemia, unspecified: Secondary | ICD-10-CM | POA: Insufficient documentation

## 2013-09-16 DIAGNOSIS — M5387 Other specified dorsopathies, lumbosacral region: Secondary | ICD-10-CM

## 2013-09-16 MED ORDER — TRAMADOL HCL ER 100 MG PO TB24: 100.0000 mg | ORAL_TABLET | Freq: Every day | ORAL | Status: DC

## 2013-09-16 MED ORDER — DICLOFENAC SODIUM 1 % TD GEL: 2.0000 g | Freq: Four times a day (QID) | TRANSDERMAL | Status: DC

## 2013-09-16 MED ORDER — METHYLPREDNISOLONE 4 MG PO KIT: PACK | ORAL | Status: DC

## 2013-09-16 NOTE — Progress Notes (Signed)
Subjective:    Patient ID: Kristopher [name], male    DOB: [birth year], 80 y.o.   MRN: [id number]  HPI Chief complaint is right knee pain Onset of symptoms approximately 2 weeks ago. Patient was standing at sink when he had sudden onset of knee pain and swelling. He was admitted to [facility] by the cardiology service. Ruled out for vascular cause. Had MRI of the knee demonstrating patellofemoral arthritis but no other abnormalities. His MRI was of suboptimal quality secondary to excessive patient movement. The patient was concern that the MRI "did something to my knee" Seen by orthopedics had a cortisone shot which was partially helpful. Patient also complains of foot darkness Bilateral ABI's normal Sept 2014 with recommended recheck in one year  Patient has been prescribed narcotic analgesics by primary care physician for his back pain in the past. He is taking twice a day hydrocodone at the current time which she states is of minimal benefit. He wakes up at night with right knee pain. Pain Inventory Average Pain 5 Pain Right Now 8 My pain is constant, sharp, burning, dull, stabbing, tingling and aching  In the last 24 hours, has pain interfered with the following? General activity 9 Relation with others 2 Enjoyment of life 9 What TIME of day is your pain at its worst? all Sleep (in general) Poor  Pain is worse with: walking, sitting, inactivity and standing Pain improves with: heat/ice and medication Relief from Meds: 7  Mobility walk with assistance use a cane use a walker do you drive?  yes  Function retired I need assistance with the following:  meal prep, household duties and shopping  Neuro/Psych weakness numbness tingling trouble walking  Prior Studies Any changes since last visit?  yes  Hospitalization at [facility] involved in your care Primary care [name]   Family History  Problem Relation Age of Onset  . Heart disease Mother   . Heart  disease Father    History   Social History  . Marital Status: Widowed    Spouse Name: N/A    Number of Children: N/A  . Years of Education: GED   Occupational History  . retired    Social History Main Topics  . Smoking status: Former Smoker -- 3.00 packs/day for 38 years    Quit date: 04/10/1988  . Smokeless tobacco: Never Used  . Alcohol Use: No  . Drug Use: No  . Sexual Activity: None   Other Topics Concern  . None   Social History Narrative  . None   Past Surgical History  Procedure Laterality Date  . Pci of lad  05/1996    STENT X 4 TOTAL  . Hemorrhoid surgery  1954  . Cataract extraction w/ intraocular lens  implant, bilateral  YRS AGO  . Vasectomy  1968  . Transurethral resection of prostate  08/11/2011    Procedure: TRANSURETHRAL RESECTION OF THE PROSTATE WITH GYRUS INSTRUMENTS;  Surgeon: Fredricka Bonine, MD;  Location: [facility];  Service: Urology;  Laterality: N/A;     . Coronary angioplasty with stent placement     Past Medical History  Diagnosis Date  . HTN (hypertension)   . CAD (coronary artery disease)     s/p PCI of LAD 1998;  s/p DES to RCA 05/2009;  Myoview 4/13: low risk, no ischemia, EF 56%  . Aortic stenosis     echo 4/12: normal LVF, mild LAE, no significant AS  . HLD (hyperlipidemia)   .  PAD (peripheral artery disease)     s/p L CIA stent 09/2011 ([place])  . Renal artery stenosis     s/p bilat RA stents  . Esophageal reflux   . DJD (degenerative joint disease), lumbar 07/06/2011  . Urine incontinence   . Acute myocardial infarction, unspecified site, episode of care unspecified 1998  . COPD (chronic obstructive pulmonary disease)   . AAA (abdominal aortic aneurysm)     ultrasound 07/2011:  2.7 x 2.8 cm   BP 104/63  Pulse 88  Resp 14  Ht 5\' 9"  (1.753 m)  Wt 145 lb 3.2 oz (65.862 kg)  BMI 21.43 kg/m2  SpO2 98%  Opioid Risk Score:   Fall Risk Score: Moderate Fall Risk (6-13 points) (educated and handout for fall prevention in the  home was given)  Review of Systems  Respiratory: Positive for shortness of breath.   Cardiovascular: Positive for leg swelling.  Gastrointestinal: Positive for constipation.  Musculoskeletal: Positive for back pain and gait problem.       Spasms  Neurological: Positive for weakness and numbness.  Hematological: Bruises/bleeds easily.  All other systems reviewed and are negative.      Objective:   Physical Exam  General no acute distress Mood and affect very anxious Upper extremity strength 5/5 bilateral deltoid, bicep, tricep, grip Lower extremity strength 5/5 bilateral hip flexor knee extensor ankle dorsiflexor plantar flexor Right knee no evidence of effusion no erythema. There's diffuse tenderness to palpation and Hypersensitivity to touch in the suprapatellar and infrapatellar region as well as the joint line medial and lateral. Bilateral feet have intact dorsalis pedis and posterior tibial pulses. There is some red discoloration of the toes that clears with right foot elevation. Some tenderness palpation in the right foot. Sensation is normal to light touch in bilateral lower extremities Hip range of motion is full and pain-free.       Assessment & Plan:  1. Patellofemoral arthritis. Recommend diclofenac gel. Recommend quadricep exercises. His right lower extremity pain cannot be completely explained by patellofemoral arthritis. I suspect he may have some her date July this given hypersensitivity to touch both proximal and distal to the knee  2 chronic low back pain I looked at his x-rays. He does have evidence of lumbar facet arthritis. Given his age lumbar spinal stenosis is certainly a possibility. The onset of the pain was during standing. He also has pain in bed. We'll check CT of the lumbar spine given his poor tolerance for MRIs. Should be able to evaluate at least for central stenosis He may benefit from additional physical therapy as well as injections. Trial Medrol  Dosepak, avoiding nonsteroidal anti-inflammatory secondary to coronary artery disease as well as grade 1 diastolic dysfunction  Return to clinic one month to followup  3. Right foot discoloration. Does not have large vessel occlusion but may have small vessel disease. Recommend followup with cardiology

## 2013-09-16 NOTE — Patient Instructions (Signed)
Apply cream 4 times a day to the right knee  Follow instructions for the anti-inflammatory medicine which is medrol  May take tramadol once a day as a long acting pain medication in conjunction with hydrocodone.  Recommend CT scan of the lumbar spine to see whether any nerves that go down to the right leg are  pinched by spine arthritis

## 2013-09-17 ENCOUNTER — Telehealth: Payer: Self-pay

## 2013-09-17 NOTE — Telephone Encounter (Signed)
Pharmacy called on behalf of patient. Tramadol 100 mg ER is covered by patient's insurance, but has a extremely high co pay. Pharmacist/Patient requests to change the RX. Pharmacist states that Tramadol has a much lower co pay than the ER. Please advise.

## 2013-09-18 MED ORDER — TRAMADOL HCL 50 MG PO TABS: 50.0000 mg | ORAL_TABLET | Freq: Three times a day (TID) | ORAL | Status: DC | PRN

## 2013-09-18 NOTE — Telephone Encounter (Signed)
Patient is ok trying a different tramadol due to copay.

## 2013-09-18 NOTE — Telephone Encounter (Signed)
Colin tramadol 50 mg 1 per os 3 times a day #90 one refill

## 2013-09-18 NOTE — Telephone Encounter (Signed)
Tramadol called to pharmacy. Mr Knotek notified.

## 2013-09-18 NOTE — Telephone Encounter (Signed)
Wanted a long acting agent so that is why I chose that med.  Pt can decide whether he wants to pay for it

## 2013-09-22 ENCOUNTER — Encounter (HOSPITAL_COMMUNITY): Payer: Self-pay

## 2013-09-22 ENCOUNTER — Ambulatory Visit (HOSPITAL_COMMUNITY)
Admission: RE | Admit: 2013-09-22 | Discharge: 2013-09-22 | Disposition: A | Discharge status: Home / Self Care | Payer: Medicare Other | Source: Ambulatory Visit | Attending: Physical Medicine & Rehabilitation | Admitting: Physical Medicine & Rehabilitation

## 2013-09-22 DIAGNOSIS — M47817 Spondylosis without myelopathy or radiculopathy, lumbosacral region: Secondary | ICD-10-CM | POA: Insufficient documentation

## 2013-09-22 DIAGNOSIS — M5387 Other specified dorsopathies, lumbosacral region: Secondary | ICD-10-CM

## 2013-09-24 ENCOUNTER — Telehealth: Payer: Self-pay | Admitting: *Deleted

## 2013-09-24 DIAGNOSIS — M545 Low back pain, unspecified: Secondary | ICD-10-CM

## 2013-09-24 DIAGNOSIS — M4716 Other spondylosis with myelopathy, lumbar region: Secondary | ICD-10-CM

## 2013-09-24 NOTE — Telephone Encounter (Signed)
Order placed for physical therapy evaluation.  I spoke with Kristopher [name] wife (he was driving and they were out of town).  They will call to schedule the caudle ESI when they get back into town next week.

## 2013-09-24 NOTE — Telephone Encounter (Signed)
Message copied by Caro Hight on Wed Sep 24, 2013  2:53 PM ------      Message from: Charlett Blake      Created: Wed Sep 24, 2013  1:16 PM       May schedule for caudal ESI as well as PT evaluation ------

## 2013-10-08 ENCOUNTER — Telehealth: Payer: Self-pay | Admitting: Cardiology

## 2013-10-08 NOTE — Telephone Encounter (Signed)
Walk in pt Form " Disability Pla-Card" Kristopher [name] Back Thursday 7.1.15/km

## 2013-10-09 ENCOUNTER — Other Ambulatory Visit: Payer: Self-pay

## 2013-10-09 MED ORDER — DICLOFENAC SODIUM 1 % TD GEL: 2.0000 g | Freq: Four times a day (QID) | TRANSDERMAL | Status: DC

## 2013-10-09 NOTE — Telephone Encounter (Signed)
Received a pharmacy request to dispense a 90 day supply of Voltaren Gel. New order sent to [facility] for a 90 day supply.

## 2013-10-14 ENCOUNTER — Encounter: Payer: Self-pay | Admitting: Registered Nurse

## 2013-10-14 ENCOUNTER — Encounter: Payer: Medicare Other | Attending: Registered Nurse | Admitting: Registered Nurse

## 2013-10-14 ENCOUNTER — Telehealth: Payer: Self-pay | Admitting: Cardiology

## 2013-10-14 VITALS — BP 129/69 | HR 65 | Resp 14 | Ht 68.0 in | Wt 143.0 lb

## 2013-10-14 DIAGNOSIS — G8929 Other chronic pain: Secondary | ICD-10-CM | POA: Insufficient documentation

## 2013-10-14 DIAGNOSIS — E785 Hyperlipidemia, unspecified: Secondary | ICD-10-CM | POA: Diagnosis not present

## 2013-10-14 DIAGNOSIS — M129 Arthropathy, unspecified: Secondary | ICD-10-CM | POA: Diagnosis not present

## 2013-10-14 DIAGNOSIS — G894 Chronic pain syndrome: Secondary | ICD-10-CM | POA: Diagnosis not present

## 2013-10-14 DIAGNOSIS — I252 Old myocardial infarction: Secondary | ICD-10-CM | POA: Diagnosis not present

## 2013-10-14 DIAGNOSIS — I10 Essential (primary) hypertension: Secondary | ICD-10-CM | POA: Diagnosis not present

## 2013-10-14 DIAGNOSIS — I2510 Atherosclerotic heart disease of native coronary artery without angina pectoris: Secondary | ICD-10-CM

## 2013-10-14 DIAGNOSIS — M545 Low back pain, unspecified: Secondary | ICD-10-CM | POA: Insufficient documentation

## 2013-10-14 DIAGNOSIS — I359 Nonrheumatic aortic valve disorder, unspecified: Secondary | ICD-10-CM | POA: Diagnosis not present

## 2013-10-14 DIAGNOSIS — M25561 Pain in right knee: Secondary | ICD-10-CM

## 2013-10-14 DIAGNOSIS — M25569 Pain in unspecified knee: Secondary | ICD-10-CM

## 2013-10-14 DIAGNOSIS — J4489 Other specified chronic obstructive pulmonary disease: Secondary | ICD-10-CM | POA: Diagnosis not present

## 2013-10-14 DIAGNOSIS — I739 Peripheral vascular disease, unspecified: Secondary | ICD-10-CM | POA: Insufficient documentation

## 2013-10-14 DIAGNOSIS — M5430 Sciatica, unspecified side: Secondary | ICD-10-CM

## 2013-10-14 DIAGNOSIS — J449 Chronic obstructive pulmonary disease, unspecified: Secondary | ICD-10-CM | POA: Insufficient documentation

## 2013-10-14 NOTE — Progress Notes (Signed)
Subjective:    Patient ID: Kristopher [name], male    DOB: 02-01-1931, 80 y.o.   MRN: [id number]  HPI: [name] is a 80 year old male who returns for follow up for chronic pain and medication refill. He says his pain is located in his right knee, right hip and right ankle. He is wearing a right knee brace. He rates his pain 8. His current exercise regime is using his stationary bicycle, treadmill and stationary pulley machine. He rates his pain 8.  [name] wanted his hydrocodone refilled, unable to due to  inconsistency with the urine results. Will discussed with Dr. Letta Pate on Monday 10/20/13. He verbalized understanding. Pain Inventory Average Pain 7 Pain Right Now 8 My pain is sharp, burning, aching and stinging  In the last 24 hours, has pain interfered with the following? General activity 7 Relation with others 7 Enjoyment of life 7 What TIME of day is your pain at its worst? daytime, night Sleep (in general) Poor  Pain is worse with: walking, sitting, inactivity and standing Pain improves with: rest, heat/ice, therapy/exercise and medication Relief from Meds: 7  Mobility walk with assistance use a cane how many minutes can you walk? 5 ability to climb steps?  yes do you drive?  yes transfers alone Do you have any goals in this area?  yes  Function not employed: date last employed 1995 disabled: date disabled 1995 I need assistance with the following:  household duties and shopping  Neuro/Psych bowel control problems weakness numbness tingling trouble walking  Prior Studies Any changes since last visit?  yes x-rays CT/MRI  Physicians involved in your care Any changes since last visit?  no   Family History  Problem Relation Age of Onset  . Heart disease Mother   . Heart disease Father    History   Social History  . Marital Status: Widowed    Spouse Name: N/A    Number of Children: N/A  . Years of Education: GED   Occupational History    . retired    Social History Main Topics  . Smoking status: Former Smoker -- 3.00 packs/day for 38 years    Quit date: 04/10/1988  . Smokeless tobacco: Never Used  . Alcohol Use: No  . Drug Use: No  . Sexual Activity: None   Other Topics Concern  . None   Social History Narrative  . None   Past Surgical History  Procedure Laterality Date  . Pci of lad  05/1996    STENT X 4 TOTAL  . Hemorrhoid surgery  1954  . Cataract extraction w/ intraocular lens  implant, bilateral  YRS AGO  . Vasectomy  1968  . Transurethral resection of prostate  08/11/2011    Procedure: TRANSURETHRAL RESECTION OF THE PROSTATE WITH GYRUS INSTRUMENTS;  Surgeon: Fredricka Bonine, MD;  Location: [facility];  Service: Urology;  Laterality: N/A;     . Coronary angioplasty with stent placement     Past Medical History  Diagnosis Date  . HTN (hypertension)   . CAD (coronary artery disease)     s/p PCI of LAD 1998;  s/p DES to RCA 05/2009;  Myoview 4/13: low risk, no ischemia, EF 56%  . Aortic stenosis     echo 4/12: normal LVF, mild LAE, no significant AS  . HLD (hyperlipidemia)   . PAD (peripheral artery disease)     s/p L CIA stent 09/2011 ([facility])  . Renal artery stenosis  s/p bilat RA stents  . Esophageal reflux   . DJD (degenerative joint disease), lumbar 07/06/2011  . Urine incontinence   . Acute myocardial infarction, unspecified site, episode of care unspecified 1998  . COPD (chronic obstructive pulmonary disease)   . AAA (abdominal aortic aneurysm)     ultrasound 07/2011:  2.7 x 2.8 cm   BP 129/69  Pulse 65  Resp 14  Ht 5\' 8"  (1.727 m)  Wt 143 lb (64.864 kg)  BMI 21.75 kg/m2  SpO2 96%  Opioid Risk Score:   Fall Risk Score: High Fall Risk (>13 points) (pt educated on fall risk, brochure given to pt previously)    Review of Systems  Constitutional: Positive for appetite change and unexpected weight change.  Gastrointestinal: Positive for constipation.  Genitourinary:       Bowel  control problems  Musculoskeletal: Positive for gait problem.  Neurological: Positive for weakness and numbness.       Tingling  Hematological: Bruises/bleeds easily.  All other systems reviewed and are negative.      Objective:   Physical Exam  Nursing note and vitals reviewed. Constitutional: He is oriented to person, place, and time. He appears well-developed and well-nourished.  HENT:  Head: Normocephalic and atraumatic.  Neck: Normal range of motion. Neck supple.  Cardiovascular: Normal rate and regular rhythm.   Pulmonary/Chest: Effort normal and breath sounds normal.  Musculoskeletal:  Normal Muscle Bulk and Muscle Testing Reveals: Upper Extremities: Full ROM and Muscle Strength 5/5 Lumbar Paraspinal Tenderness: (mainly right side) Lower Extremities: Left Leg: Full ROM and Muscle Strength 5/5. Right Leg with Decreased ROM Arises from chair with ease Narrow based Gait Uses a 4 prong gait  Neurological: He is alert and oriented to person, place, and time.  Skin: Skin is warm and dry.  Psychiatric: He has a normal mood and affect.          Assessment & Plan:  1. Patellofemoral arthritis. Continue Voltaren gel, and continue with exercise regimen. Recommend quadricep exercises.  2 Chronic Low Back Pain: Continue with Tramadol. Hydrocodone on Hold at this time. Will Discuss with Dr. Letta Pate.  30 minutes of face to face patient care time was spent during this visit. All questions were encouraged and answered.  F/U in 1 month

## 2013-10-14 NOTE — Telephone Encounter (Signed)
Dropped paper to be completed for Handicap Sticker off over week ago.    Has not heard from anyone.  Is it ready to be picked up. Please call

## 2013-10-14 NOTE — Telephone Encounter (Signed)
Spoke with pt, aware plaque signed today. Placed in the mail per pt request.

## 2013-10-14 NOTE — Telephone Encounter (Signed)
Forward to [employer]. RN

## 2013-10-20 ENCOUNTER — Telehealth: Payer: Self-pay | Admitting: Registered Nurse

## 2013-10-20 NOTE — Telephone Encounter (Signed)
I spoke with Dr. Letta Pate regarding [name]. We will prescribed his Hydrocodone BID. We will also obtain another [name] with his next visit.  I left a message for Kristopher [name] to call the office. He will be able to pick up the script. Awaiting a call back.

## 2013-10-21 ENCOUNTER — Other Ambulatory Visit: Payer: Self-pay

## 2013-10-21 ENCOUNTER — Encounter: Payer: Self-pay | Admitting: Cardiology

## 2013-10-21 ENCOUNTER — Ambulatory Visit (INDEPENDENT_AMBULATORY_CARE_PROVIDER_SITE_OTHER): Payer: Medicare Other | Admitting: Cardiology

## 2013-10-21 ENCOUNTER — Telehealth: Payer: Self-pay | Admitting: Internal Medicine

## 2013-10-21 VITALS — BP 135/75 | HR 58 | Ht 69.0 in | Wt 143.3 lb

## 2013-10-21 DIAGNOSIS — R160 Hepatomegaly, not elsewhere classified: Secondary | ICD-10-CM

## 2013-10-21 DIAGNOSIS — I714 Abdominal aortic aneurysm, without rupture, unspecified: Secondary | ICD-10-CM

## 2013-10-21 DIAGNOSIS — I441 Atrioventricular block, second degree: Secondary | ICD-10-CM

## 2013-10-21 DIAGNOSIS — I739 Peripheral vascular disease, unspecified: Secondary | ICD-10-CM

## 2013-10-21 DIAGNOSIS — I2510 Atherosclerotic heart disease of native coronary artery without angina pectoris: Secondary | ICD-10-CM

## 2013-10-21 DIAGNOSIS — I10 Essential (primary) hypertension: Secondary | ICD-10-CM

## 2013-10-21 DIAGNOSIS — I701 Atherosclerosis of renal artery: Secondary | ICD-10-CM | POA: Diagnosis not present

## 2013-10-21 DIAGNOSIS — K769 Liver disease, unspecified: Secondary | ICD-10-CM

## 2013-10-21 DIAGNOSIS — E785 Hyperlipidemia, unspecified: Secondary | ICD-10-CM

## 2013-10-21 MED ORDER — HYDROCODONE-ACETAMINOPHEN 5-325 MG PO TABS: 1.0000 | ORAL_TABLET | Freq: Two times a day (BID) | ORAL | Status: DC

## 2013-10-21 MED ORDER — NITROGLYCERIN 0.4 MG SL SUBL: 0.4000 mg | SUBLINGUAL_TABLET | SUBLINGUAL | Status: DC | PRN

## 2013-10-21 MED ORDER — TRAMADOL HCL 50 MG PO TABS: 50.0000 mg | ORAL_TABLET | Freq: Three times a day (TID) | ORAL | Status: DC | PRN

## 2013-10-21 NOTE — Assessment & Plan Note (Signed)
Patient was noted to have Mobitz 1 and occasional 2-1 AV block during recent hospitalization. He has not had symptoms. We will follow for now. May require pacemaker in the future.

## 2013-10-21 NOTE — Telephone Encounter (Signed)
The patient was seen by Dr. Stanford Breed this morning and stated he needed another MRI to be scheduled to check his liver.  The patient has an appointment with PCP 01/15/14 and wondered if needs to be changed to be seen before to followup with MRI.

## 2013-10-21 NOTE — Assessment & Plan Note (Signed)
Continue statin. 

## 2013-10-21 NOTE — Assessment & Plan Note (Signed)
Continue aspirin and statin. 

## 2013-10-21 NOTE — Telephone Encounter (Signed)
Hydrocodone rx printed and given to patient.

## 2013-10-21 NOTE — Assessment & Plan Note (Signed)
Follow-up abdominal ultrasound June 2016. 

## 2013-10-21 NOTE — Telephone Encounter (Signed)
Pt came in to speak with Dr Jenny Reichmann or assistant. Pt states he just left his cardiologist and has questions and concerns. Pt also requests to be advised if another appt will be necessary. Please contact pt when request is reviewed.

## 2013-10-21 NOTE — Patient Instructions (Signed)
Your physician wants you to follow-up in: 6 MONTHS WITH DR CRENSHAW You will receive a reminder letter in the mail two months in advance. If you don't receive a letter, please call our office to schedule the follow-up appointment.  

## 2013-10-21 NOTE — Telephone Encounter (Signed)
Order done, pcc should call

## 2013-10-21 NOTE — Assessment & Plan Note (Signed)
Patient with question liver density on recent CT scan. I have instructed him to followup with his primary care physician as he may require MRI of the liver.

## 2013-10-21 NOTE — Progress Notes (Signed)
HPI: FU CAD, s/p PCI of his LAD performed in February 1998. He also has PVD and has had prior bilateral renal artery stenting. Cardiac catheterization in February 2011: EF 60% and no renal artery stenosis, RCA 90% but no obstructive disease in the left system. He had PCI of his right coronary artery with a drug-eluting stent at that time. Echocardiogram in April of 2012 revealed normal LV function, mild LAE, no significant AS. Myoview 07/31/11: low risk, no ischemia, EF 56%. LE angiogram 09/20/11: Small infrarenal aortic aneurysm, no renal artery stenosis, ostial right CIA 60%, proximal left CIA severely calcified with 80-90%, left EIA occluded and filled by collats. He underwent intervention with angioplasty and stenting to the left CIA. 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 Followup recommended one year. Recently admitted with back pain. During that admission he was noted to have asymptomatic Mobitz 1. Note abdominal CT during that admission showed vague nonspecific 1.5 cm focus of contrast blush at the medial right hepatic lobe and a dynamic liver protocol MRI could be considered for further evaluation. Since discharge There is no dyspnea, chest pain, palpitations or syncope. He continues to have right knee pain.   Current Outpatient Prescriptions  Medication Sig Dispense Refill  . acetaminophen (TYLENOL) 325 MG tablet Take 2 tablets (650 mg total) by mouth every 6 (six) hours as needed for mild pain (or Fever >/= 101).      . Ascorbic Acid (VITAMIN C) 500 MG tablet Take 500 mg by mouth 2 (two) times daily.       Marland Kitchen aspirin 325 MG EC tablet Take 325 mg by mouth daily.      Marland Kitchen atorvastatin (LIPITOR) 80 MG tablet Take 1 tablet (80 mg total) by mouth daily.  90 tablet  3  . BEE POLLEN PO Take by mouth.      Marland Kitchen Co-Enzyme Q-10 100 MG CAPS Take 1 capsule by mouth daily.      . cyanocobalamin 1000 MCG  tablet Take 100 mcg by mouth daily.      . diclofenac sodium (VOLTAREN) 1 % GEL Apply 2 g topically 4 (four) times daily.  9 Tube  1  . fish oil-omega-3 fatty acids 1000 MG capsule Take 1 g by mouth daily.       . Multiple Vitamin (MULTIVITAMIN) capsule Take 1 capsule by mouth daily.       . nitroGLYCERIN (NITROSTAT) 0.4 MG SL tablet Place 1 tablet (0.4 mg total) under the tongue every 5 (five) minutes as needed for chest pain.  25 tablet  3  . omeprazole (PRILOSEC) 20 MG capsule Take 20 mg by mouth 2 (two) times daily before a meal. 2 tabs by mouth per day      . ramipril (ALTACE) 10 MG capsule Take 1 capsule (10 mg total) by mouth daily with breakfast.  90 capsule  3  . traMADol (ULTRAM) 50 MG tablet Take 1 tablet (50 mg total) by mouth 3 (three) times daily as needed.  90 tablet  1   No current facility-administered medications for this visit.     Past Medical History  Diagnosis Date  . HTN (hypertension)   . CAD (coronary artery disease)     s/p PCI of LAD 1998;  s/p DES to RCA 05/2009;  Myoview 4/13: low risk, no ischemia, EF 56%  . Aortic stenosis     echo  4/12: normal LVF, mild LAE, no significant AS  . HLD (hyperlipidemia)   . PAD (peripheral artery disease)     s/p L CIA stent 09/2011 ([facility])  . Renal artery stenosis     s/p bilat RA stents  . Esophageal reflux   . DJD (degenerative joint disease), lumbar 07/06/2011  . Urine incontinence   . Acute myocardial infarction, unspecified site, episode of care unspecified 1998  . COPD (chronic obstructive pulmonary disease)   . AAA (abdominal aortic aneurysm)     ultrasound 07/2011:  2.7 x 2.8 cm    Past Surgical History  Procedure Laterality Date  . Pci of lad  05/1996    STENT X 4 TOTAL  . Hemorrhoid surgery  1954  . Cataract extraction w/ intraocular lens  implant, bilateral  YRS AGO  . Vasectomy  1968  . Transurethral resection of prostate  08/11/2011    Procedure: TRANSURETHRAL RESECTION OF THE PROSTATE WITH GYRUS  INSTRUMENTS;  Surgeon: Fredricka Bonine, MD;  Location: [facility];  Service: Urology;  Laterality: N/A;     . Coronary angioplasty with stent placement      History   Social History  . Marital Status: Widowed    Spouse Name: N/A    Number of Children: N/A  . Years of Education: GED   Occupational History  . retired    Social History Main Topics  . Smoking status: Former Smoker -- 3.00 packs/day for 38 years    Quit date: 04/10/1988  . Smokeless tobacco: Never Used  . Alcohol Use: No  . Drug Use: No  . Sexual Activity: Not on file   Other Topics Concern  . Not on file   Social History Narrative  . No narrative on file    ROS: Right knee pain but no fevers or chills, productive cough, hemoptysis, dysphasia, odynophagia, melena, hematochezia, dysuria, hematuria, rash, seizure activity, orthopnea, PND, pedal edema, claudication. Remaining systems are negative.  Physical Exam: Well-developed well-nourished in no acute distress.  Skin is warm and dry.  HEENT is normal.  Neck is supple.  Chest is clear to auscultation with normal expansion.  Cardiovascular exam is regular rate and rhythm.  Abdominal exam nontender or distended. No masses palpated. Extremities show no edema. neuro grossly intact

## 2013-10-21 NOTE — Assessment & Plan Note (Signed)
Blood pressure controlled. Continue present medications. 

## 2013-10-22 ENCOUNTER — Telehealth: Payer: Self-pay | Admitting: Internal Medicine

## 2013-10-22 ENCOUNTER — Telehealth: Payer: Self-pay | Admitting: *Deleted

## 2013-10-22 DIAGNOSIS — R160 Hepatomegaly, not elsewhere classified: Secondary | ICD-10-CM

## 2013-10-22 NOTE — Telephone Encounter (Signed)
MD place order for MR Abdomen wo contrast. needing order to be change to with & without...Kristopher [name]

## 2013-10-22 NOTE — Telephone Encounter (Signed)
Relevant patient education mailed to patient.  

## 2013-10-22 NOTE — Telephone Encounter (Signed)
Patient informed referral done for MRI

## 2013-10-27 ENCOUNTER — Ambulatory Visit
Admission: RE | Admit: 2013-10-27 | Discharge: 2013-10-27 | Disposition: A | Discharge status: Home / Self Care | Payer: Medicare Other | Source: Ambulatory Visit | Attending: Internal Medicine | Admitting: Internal Medicine

## 2013-10-27 ENCOUNTER — Encounter: Payer: Self-pay | Admitting: Internal Medicine

## 2013-10-27 DIAGNOSIS — N281 Cyst of kidney, acquired: Secondary | ICD-10-CM | POA: Diagnosis not present

## 2013-10-27 DIAGNOSIS — K7689 Other specified diseases of liver: Secondary | ICD-10-CM | POA: Diagnosis not present

## 2013-10-27 DIAGNOSIS — R160 Hepatomegaly, not elsewhere classified: Secondary | ICD-10-CM

## 2013-10-27 MED ORDER — GADOBENATE DIMEGLUMINE 529 MG/ML IV SOLN
13.0000 mL | Freq: Once | INTRAVENOUS | Status: AC | PRN
  Administered 2013-10-27: 13 mL via INTRAVENOUS

## 2013-11-06 ENCOUNTER — Other Ambulatory Visit: Payer: Self-pay | Admitting: Cardiology

## 2013-11-06 ENCOUNTER — Ambulatory Visit (HOSPITAL_BASED_OUTPATIENT_CLINIC_OR_DEPARTMENT_OTHER): Payer: Medicare Other | Admitting: Physical Medicine & Rehabilitation

## 2013-11-06 ENCOUNTER — Encounter: Payer: Self-pay | Admitting: Physical Medicine & Rehabilitation

## 2013-11-06 VITALS — BP 148/58 | HR 84 | Resp 14 | Wt 146.0 lb

## 2013-11-06 DIAGNOSIS — M5430 Sciatica, unspecified side: Secondary | ICD-10-CM

## 2013-11-06 DIAGNOSIS — M25561 Pain in right knee: Secondary | ICD-10-CM

## 2013-11-06 DIAGNOSIS — M7750 Other enthesopathy of unspecified foot: Secondary | ICD-10-CM

## 2013-11-06 DIAGNOSIS — Z79899 Other long term (current) drug therapy: Secondary | ICD-10-CM | POA: Diagnosis not present

## 2013-11-06 DIAGNOSIS — I2510 Atherosclerotic heart disease of native coronary artery without angina pectoris: Secondary | ICD-10-CM

## 2013-11-06 DIAGNOSIS — M25569 Pain in unspecified knee: Secondary | ICD-10-CM | POA: Diagnosis not present

## 2013-11-06 DIAGNOSIS — Z5181 Encounter for therapeutic drug level monitoring: Secondary | ICD-10-CM | POA: Diagnosis not present

## 2013-11-06 DIAGNOSIS — M25879 Other specified joint disorders, unspecified ankle and foot: Secondary | ICD-10-CM | POA: Insufficient documentation

## 2013-11-06 DIAGNOSIS — M25871 Other specified joint disorders, right ankle and foot: Secondary | ICD-10-CM

## 2013-11-06 MED ORDER — HYDROCODONE-ACETAMINOPHEN 5-325 MG PO TABS: 1.0000 | ORAL_TABLET | Freq: Two times a day (BID) | ORAL | Status: DC

## 2013-11-06 NOTE — Patient Instructions (Signed)
Knee vs. hip injection.  Your hip pain actually may be coming from your low back area

## 2013-11-06 NOTE — Progress Notes (Signed)
Subjective:    Patient ID: Kristopher [name], male    DOB: February 13, 1931, 80 y.o.   MRN: [id number]  HPI Back pain better,  Knee overall improved after injection by orthopedics  [place] in June on Right knee, injured toes but better now  Takes tramadol TID  Hydrocodone BID  Hip Xray mild degenerative changes Pain Inventory Average Pain 8 Pain Right Now 3 My pain is burning, stabbing and tingling  In the last 24 hours, has pain interfered with the following? General activity 7 Relation with others 7 Enjoyment of life 9 What TIME of day is your pain at its worst? varies Sleep (in general) Fair  Pain is worse with: unsure Pain improves with: heat/ice and medication Relief from Meds: 3  Mobility walk with assistance use a cane ability to climb steps?  yes do you drive?  yes  Function retired  Neuro/Psych trouble walking  Prior Studies Any changes since last visit?  no CLINICAL DATA:  Low back and RLE pain,   EXAM: CT LUMBAR SPINE WITHOUT CONTRAST   TECHNIQUE: Multidetector CT imaging of the lumbar spine was performed without intravenous contrast administration. Multiplanar CT image reconstructions were also generated.   COMPARISON:  CT abdomen pelvis 09/01/2013   FINDINGS: No fracture nor dislocation. Schmorl's nodes, mild, superior and inferior endplates of L1.   The disc space heights are maintained within the lumbar spine. There is disc space narrowing at T12-L1.   At the L1-2 level, a mild broad-based disc bulge appreciated causing partial effacement of the anterior CSF space. No evidence of neural foraminal narrowing.   At the L2-3 level a broad-based disc bulge is appreciated demonstrating lateralization to the right and left. Periarticular facet sclerosis and hypertrophy. Multifactorial mild canal stenosis is appreciated.   At the L3-4 level a broad-based disc bulge is appreciated causing partial effacement of the anterior CSF space. There is mild  facet sclerosis and hypertrophy. Mild multifactorial canal stenosis is appreciated. There is evidence of mild neural foraminal narrowing on the left secondary to hypertrophic spurring of the L4 facet.   At the L4-5 level a focal central disc protrusion is identified resulting in encroachment upon the anterior CSF space. The lateral aspects of the canal are narrowed secondary to facet hypertrophy and sclerosis as well as ligamentum flavum hypertrophy. There is no evidence of neural foraminal narrowing secondary to bony spurring. There is moderate multifactorial canal narrowing.   At the L5-S1 level. A broad-based disc bulge is appreciated demonstrating lateralization to the right than left. Mild canal narrowing is appreciated at this level. There is facet hypertrophy and sclerosis. Ligamentum flavum hypertrophy also appreciated. Mild neuroforaminal narrowing secondary to bony spurring on the right. The neural foramen on the left appears patent.   Atherosclerotic changes within the aorta and iliac vessels.   5 cm cyst left kidney.   IMPRESSION: Areas of multifactorial spondylosis most prominent at the L4-5 level. Further evaluation with lumbar MRI is recommended for further characterization.    Physicians involved in your care Any changes since last visit?  no   Family History  Problem Relation Age of Onset  . Heart disease Mother   . Heart disease Father    History   Social History  . Marital Status: Widowed    Spouse Name: N/A    Number of Children: N/A  . Years of Education: GED   Occupational History  . retired    Social History Main Topics  . Smoking status: Former Smoker --  3.00 packs/day for 38 years    Quit date: 04/10/1988  . Smokeless tobacco: Never Used  . Alcohol Use: No  . Drug Use: No  . Sexual Activity: None   Other Topics Concern  . None   Social History Narrative  . None   Past Surgical History  Procedure Laterality Date  . Pci of lad   05/1996    STENT X 4 TOTAL  . Hemorrhoid surgery  1954  . Cataract extraction w/ intraocular lens  implant, bilateral  YRS AGO  . Vasectomy  1968  . Transurethral resection of prostate  08/11/2011    Procedure: TRANSURETHRAL RESECTION OF THE PROSTATE WITH GYRUS INSTRUMENTS;  Surgeon: Fredricka Bonine, MD;  Location: [facility];  Service: Urology;  Laterality: N/A;     . Coronary angioplasty with stent placement     Past Medical History  Diagnosis Date  . HTN (hypertension)   . CAD (coronary artery disease)     s/p PCI of LAD 1998;  s/p DES to RCA 05/2009;  Myoview 4/13: low risk, no ischemia, EF 56%  . Aortic stenosis     echo 4/12: normal LVF, mild LAE, no significant AS  . HLD (hyperlipidemia)   . PAD (peripheral artery disease)     s/p L CIA stent 09/2011 ([facility])  . Renal artery stenosis     s/p bilat RA stents  . Esophageal reflux   . DJD (degenerative joint disease), lumbar 07/06/2011  . Urine incontinence   . Acute myocardial infarction, unspecified site, episode of care unspecified 1998  . COPD (chronic obstructive pulmonary disease)   . AAA (abdominal aortic aneurysm)     ultrasound 07/2011:  2.7 x 2.8 cm   BP 148/58  Pulse 84  Resp 14  Wt 146 lb (66.225 kg)  SpO2 95%  Opioid Risk Score:   Fall Risk Score: High Fall Risk (>13 points) (previously educated and given handout)   Review of Systems  Gastrointestinal: Positive for constipation.  Musculoskeletal: Positive for gait problem.  All other systems reviewed and are negative.      Objective:   Physical Exam  Nursing note and vitals reviewed. Constitutional: He appears well-nourished.   Pain with left hip internal rotation. Ltd. internal rotation. No pain with lateral hip palpation. No hip deformity noted Right knee has full range of motion no pain to palpation. No evidence of diffusion Right ankle has positive impingement sign with ankle dorsiflexion. Palpation along the anterior joint line. Note that no  tenderness over the Achilles tendon No pain with ankle inversion or ankle eversion Decreased pinprick sensation right L4 dermatomal distribution  Reduced right patellar reflex normal left patellar reflex Reduced bilateral Achilles reflex       Assessment & Plan:  1. Chronic right lower extremity pain appears to be multifactorial. He has numbness and pain radiating down from the hip. The hip has minimal degenerative changes on x-ray. CT of the lumbar spine demonstrates stenosis at L4-L5. He does have numbness in the L4  Distribution. In addition patient has right knee pain, MRI of the right knee performed and may 2015 demonstrates moderate patellofemoral arthropathy  The patient also has right ankle impingement and probable degenerative arthritis

## 2013-11-07 DIAGNOSIS — M25569 Pain in unspecified knee: Secondary | ICD-10-CM | POA: Diagnosis not present

## 2013-11-07 DIAGNOSIS — Z5181 Encounter for therapeutic drug level monitoring: Secondary | ICD-10-CM | POA: Diagnosis not present

## 2013-11-07 DIAGNOSIS — M5430 Sciatica, unspecified side: Secondary | ICD-10-CM | POA: Diagnosis not present

## 2013-11-07 DIAGNOSIS — Z79899 Other long term (current) drug therapy: Secondary | ICD-10-CM | POA: Diagnosis not present

## 2013-11-24 ENCOUNTER — Ambulatory Visit: Payer: Medicare Other | Admitting: Cardiology

## 2013-12-23 ENCOUNTER — Ambulatory Visit (HOSPITAL_BASED_OUTPATIENT_CLINIC_OR_DEPARTMENT_OTHER): Payer: Medicare Other | Admitting: Physical Medicine & Rehabilitation

## 2013-12-23 ENCOUNTER — Encounter: Payer: Medicare Other | Attending: Registered Nurse

## 2013-12-23 DIAGNOSIS — E785 Hyperlipidemia, unspecified: Secondary | ICD-10-CM | POA: Insufficient documentation

## 2013-12-23 DIAGNOSIS — G8929 Other chronic pain: Secondary | ICD-10-CM | POA: Diagnosis not present

## 2013-12-23 DIAGNOSIS — J4489 Other specified chronic obstructive pulmonary disease: Secondary | ICD-10-CM | POA: Insufficient documentation

## 2013-12-23 DIAGNOSIS — M25569 Pain in unspecified knee: Secondary | ICD-10-CM | POA: Diagnosis not present

## 2013-12-23 DIAGNOSIS — I252 Old myocardial infarction: Secondary | ICD-10-CM | POA: Insufficient documentation

## 2013-12-23 DIAGNOSIS — I2510 Atherosclerotic heart disease of native coronary artery without angina pectoris: Secondary | ICD-10-CM | POA: Insufficient documentation

## 2013-12-23 DIAGNOSIS — M129 Arthropathy, unspecified: Secondary | ICD-10-CM | POA: Insufficient documentation

## 2013-12-23 DIAGNOSIS — I359 Nonrheumatic aortic valve disorder, unspecified: Secondary | ICD-10-CM | POA: Insufficient documentation

## 2013-12-23 DIAGNOSIS — M1610 Unilateral primary osteoarthritis, unspecified hip: Secondary | ICD-10-CM

## 2013-12-23 DIAGNOSIS — I739 Peripheral vascular disease, unspecified: Secondary | ICD-10-CM | POA: Diagnosis not present

## 2013-12-23 DIAGNOSIS — M545 Low back pain, unspecified: Secondary | ICD-10-CM | POA: Insufficient documentation

## 2013-12-23 DIAGNOSIS — M1711 Unilateral primary osteoarthritis, right knee: Secondary | ICD-10-CM

## 2013-12-23 DIAGNOSIS — I10 Essential (primary) hypertension: Secondary | ICD-10-CM | POA: Diagnosis not present

## 2013-12-23 DIAGNOSIS — J449 Chronic obstructive pulmonary disease, unspecified: Secondary | ICD-10-CM | POA: Diagnosis not present

## 2013-12-23 DIAGNOSIS — M1710 Unilateral primary osteoarthritis, unspecified knee: Secondary | ICD-10-CM | POA: Diagnosis not present

## 2013-12-23 DIAGNOSIS — M1611 Unilateral primary osteoarthritis, right hip: Secondary | ICD-10-CM

## 2013-12-23 MED ORDER — HYDROCODONE-ACETAMINOPHEN 5-325 MG PO TABS: 1.0000 | ORAL_TABLET | Freq: Two times a day (BID) | ORAL | Status: DC

## 2013-12-23 NOTE — Patient Instructions (Signed)
Will do either hip of rR knee injection if pain increases

## 2013-12-23 NOTE — Progress Notes (Signed)
Patient originally scheduled for knee or hip injection under ultrasound guidance. He states that he did quite well with the last knee injection several months ago. Hydrocodone twice a day in combination with tramadol 3 or 4 times per day is helping his pain. He likes to keep his pain level below 3/10. No falls, no constipation. Has remained quite active.  Review of systems negative for bowel or bladder dysfunction  Examination General no acute distress Mood and affect appropriate Right knee without evidence of effusion. Mild lateral joint line tenderness. No erythema No pain with flexion extension. No evidence of medial lateral instability Right hip has positive favors. Reduced internal and external rotation. Right lower extremity 5 minus/5 hip flexion knee extension ankle dorsiflexion and plantarflexion Negative straight leg raising  Impression 1. Right hip and knee pain. He does have osteoarthritis of both the hip and in the with joint range of motion limitations. Overall he is doing well with his medication management. No need for repeat injection at this time. We'll recheck in one month and decide on either reed injecting the knee or injecting the right hip under ultrasound guidance. Discuss with patient agrees with plan

## 2014-01-15 ENCOUNTER — Other Ambulatory Visit: Payer: Self-pay | Admitting: Internal Medicine

## 2014-01-15 ENCOUNTER — Ambulatory Visit (INDEPENDENT_AMBULATORY_CARE_PROVIDER_SITE_OTHER): Payer: Medicare Other | Admitting: Internal Medicine

## 2014-01-15 ENCOUNTER — Encounter: Payer: Self-pay | Admitting: Internal Medicine

## 2014-01-15 ENCOUNTER — Other Ambulatory Visit (INDEPENDENT_AMBULATORY_CARE_PROVIDER_SITE_OTHER): Payer: Medicare Other

## 2014-01-15 VITALS — BP 118/78 | HR 66 | Temp 97.6°F | Ht 68.5 in | Wt 149.2 lb

## 2014-01-15 DIAGNOSIS — E785 Hyperlipidemia, unspecified: Secondary | ICD-10-CM

## 2014-01-15 DIAGNOSIS — Z23 Encounter for immunization: Secondary | ICD-10-CM | POA: Diagnosis not present

## 2014-01-15 DIAGNOSIS — E875 Hyperkalemia: Secondary | ICD-10-CM

## 2014-01-15 DIAGNOSIS — I10 Essential (primary) hypertension: Secondary | ICD-10-CM | POA: Diagnosis not present

## 2014-01-15 DIAGNOSIS — K219 Gastro-esophageal reflux disease without esophagitis: Secondary | ICD-10-CM | POA: Diagnosis not present

## 2014-01-15 DIAGNOSIS — I2510 Atherosclerotic heart disease of native coronary artery without angina pectoris: Secondary | ICD-10-CM | POA: Diagnosis not present

## 2014-01-15 DIAGNOSIS — J438 Other emphysema: Secondary | ICD-10-CM

## 2014-01-15 LAB — CBC WITH DIFFERENTIAL/PLATELET
Basophils Absolute: 0.1 10*3/uL (ref 0.0–0.1)
Basophils Relative: 0.8 % (ref 0.0–3.0)
Eosinophils Absolute: 0.4 10*3/uL (ref 0.0–0.7)
Eosinophils Relative: 4.3 % (ref 0.0–5.0)
HCT: 40.6 % (ref 39.0–52.0)
Hemoglobin: 13.5 g/dL (ref 13.0–17.0)
Lymphocytes Relative: 37.2 % (ref 12.0–46.0)
Lymphs Abs: 3.4 10*3/uL (ref 0.7–4.0)
MCHC: 33.1 g/dL (ref 30.0–36.0)
MCV: 91.3 fl (ref 78.0–100.0)
Monocytes Absolute: 0.9 10*3/uL (ref 0.1–1.0)
Monocytes Relative: 10.3 % (ref 3.0–12.0)
Neutro Abs: 4.3 10*3/uL (ref 1.4–7.7)
Neutrophils Relative %: 47.4 % (ref 43.0–77.0)
Platelets: 200 10*3/uL (ref 150.0–400.0)
RBC: 4.45 Mil/uL (ref 4.22–5.81)
RDW: 15.8 % — ABNORMAL HIGH (ref 11.5–15.5)
WBC: 9 10*3/uL (ref 4.0–10.5)

## 2014-01-15 LAB — HEPATIC FUNCTION PANEL
ALT: 31 U/L (ref 0–53)
AST: 35 U/L (ref 0–37)
Albumin: 3.4 g/dL — ABNORMAL LOW (ref 3.5–5.2)
Alkaline Phosphatase: 77 U/L (ref 39–117)
Bilirubin, Direct: 0.1 mg/dL (ref 0.0–0.3)
Total Bilirubin: 0.7 mg/dL (ref 0.2–1.2)
Total Protein: 6.9 g/dL (ref 6.0–8.3)

## 2014-01-15 LAB — BASIC METABOLIC PANEL
BUN: 26 mg/dL — ABNORMAL HIGH (ref 6–23)
CO2: 27 mEq/L (ref 19–32)
Calcium: 9.5 mg/dL (ref 8.4–10.5)
Chloride: 111 mEq/L (ref 96–112)
Creatinine, Ser: 1.2 mg/dL (ref 0.4–1.5)
GFR: 64.82 mL/min (ref >60.00)
Glucose, Bld: 73 mg/dL (ref 70–99)
Potassium: 5.7 mEq/L — ABNORMAL HIGH (ref 3.5–5.1)
Sodium: 144 mEq/L (ref 135–145)

## 2014-01-15 LAB — URINALYSIS, ROUTINE W REFLEX MICROSCOPIC
Bilirubin Urine: NEGATIVE
Hgb urine dipstick: NEGATIVE
Ketones, ur: NEGATIVE
Leukocytes, UA: NEGATIVE
Nitrite: NEGATIVE
RBC / HPF: NONE SEEN (ref 0–?)
Specific Gravity, Urine: 1.025 (ref 1.000–1.030)
Total Protein, Urine: NEGATIVE
Urine Glucose: NEGATIVE
Urobilinogen, UA: 0.2 (ref 0.0–1.0)
WBC, UA: NONE SEEN (ref 0–?)
pH: 5.5 (ref 5.0–8.0)

## 2014-01-15 LAB — LIPID PANEL
Cholesterol: 143 mg/dL (ref 0–200)
HDL: 45.8 mg/dL (ref >39.00)
LDL Cholesterol: 83 mg/dL (ref 0–99)
NonHDL: 97.2
Total CHOL/HDL Ratio: 3
Triglycerides: 72 mg/dL (ref 0.0–149.0)
VLDL: 14.4 mg/dL (ref 0.0–40.0)

## 2014-01-15 LAB — TSH: TSH: 3.03 u[IU]/mL (ref 0.35–4.50)

## 2014-01-15 NOTE — Progress Notes (Signed)
Pre visit review using our clinic review tool, if applicable. No additional management support is needed unless otherwise documented below in the visit note. 

## 2014-01-15 NOTE — Patient Instructions (Addendum)
You had the new Prevnar pneumonia shot today  Please continue all other medications as before  Please have the pharmacy call with any other refills you may need.  Please continue your efforts at being more active, low cholesterol diet, and weight control.  You are otherwise up to date with prevention measures today.  Please keep your appointments with your specialists as you may have planned  Please go to the LAB in the Basement (turn left off the elevator) for the tests to be done today  You will be contacted by phone if any changes need to be made immediately.  Otherwise, you will receive a letter about your results with an explanation, but please check with MyChart first.  Please return in 1 year for your yearly visit, or sooner if needed

## 2014-01-15 NOTE — Progress Notes (Signed)
Subjective:    Patient ID: Kristopher [name], male    DOB: May 09, 1930, 80 y.o.   MRN: [id number]  HPI  Here for yearly f/u;  Overall doing ok;  Pt denies CP, worsening SOB, DOE, wheezing, orthopnea, PND, worsening LE edema, palpitations, dizziness or syncope.  Pt denies neurological change such as new headache, facial or extremity weakness.  Pt denies polydipsia, polyuria, or low sugar symptoms. Pt states overall good compliance with treatment and medications, good tolerability, and has been trying to follow lower cholesterol diet.  Pt denies worsening depressive symptoms, suicidal ideation or panic. No fever, night sweats, wt loss, loss of appetite, or other constitutional symptoms.  Pt states good ability with ADL's, has low fall risk, home safety reviewed and adequate, no other significant changes in hearing or vision, and only occasionally active with exercise.  Has 4 pronged walker, after right knee injury may 2015, hospd for 1 wk, has f/u with pain clinic soon.   No other complaints. Denies worsening reflux, abd pain, dysphagia, n/v, bowel change or blood. Past Medical History  Diagnosis Date  . HTN (hypertension)   . CAD (coronary artery disease)     s/p PCI of LAD 1998;  s/p DES to RCA 05/2009;  Myoview 4/13: low risk, no ischemia, EF 56%  . Aortic stenosis     echo 4/12: normal LVF, mild LAE, no significant AS  . HLD (hyperlipidemia)   . PAD (peripheral artery disease)     s/p L CIA stent 09/2011 ([facility])  . Renal artery stenosis     s/p bilat RA stents  . Esophageal reflux   . DJD (degenerative joint disease), lumbar 07/06/2011  . Urine incontinence   . Acute myocardial infarction, unspecified site, episode of care unspecified 1998  . COPD (chronic obstructive pulmonary disease)   . AAA (abdominal aortic aneurysm)     ultrasound 07/2011:  2.7 x 2.8 cm   Past Surgical History  Procedure Laterality Date  . Pci of lad  05/1996    STENT X 4 TOTAL  . Hemorrhoid surgery  1954  . Cataract  extraction w/ intraocular lens  implant, bilateral  YRS AGO  . Vasectomy  1968  . Transurethral resection of prostate  08/11/2011    Procedure: TRANSURETHRAL RESECTION OF THE PROSTATE WITH GYRUS INSTRUMENTS;  Surgeon: Fredricka Bonine, MD;  Location: [facility];  Service: Urology;  Laterality: N/A;     . Coronary angioplasty with stent placement      reports that he quit smoking about 25 years ago. He has never used smokeless tobacco. He reports that he does not drink alcohol or use illicit drugs. family history includes Heart disease in his father and mother. Allergies  Allergen Reactions  . Influenza Vaccines Other (See Comments)    unknown  . Sulfa Antibiotics Rash   Current Outpatient Prescriptions on File Prior to Visit  Medication Sig Dispense Refill  . ALTACE 10 MG capsule TAKE 1 CAPSULE DAILY WITH BREAKFAST  90 capsule  1  . Ascorbic Acid (VITAMIN C) 500 MG tablet Take 500 mg by mouth 2 (two) times daily.       Marland Kitchen aspirin 325 MG EC tablet Take 325 mg by mouth daily.      Marland Kitchen atorvastatin (LIPITOR) 80 MG tablet Take 1 tablet (80 mg total) by mouth daily.  90 tablet  3  . BEE POLLEN PO Take by mouth.      Marland Kitchen Co-Enzyme Q-10 100 MG CAPS Take 1 capsule  by mouth daily.      . cyanocobalamin 1000 MCG tablet Take 100 mcg by mouth daily.      . diclofenac sodium (VOLTAREN) 1 % GEL Apply 2 g topically 4 (four) times daily.  9 Tube  1  . fish oil-omega-3 fatty acids 1000 MG capsule Take 1 g by mouth daily.       Marland Kitchen HYDROcodone-acetaminophen (NORCO/VICODIN) 5-325 MG per tablet Take 1 tablet by mouth 2 (two) times daily.  60 tablet  0  . Multiple Vitamin (MULTIVITAMIN) capsule Take 1 capsule by mouth daily.       . nitroGLYCERIN (NITROSTAT) 0.4 MG SL tablet Place 1 tablet (0.4 mg total) under the tongue every 5 (five) minutes as needed for chest pain.  25 tablet  12  . omeprazole (PRILOSEC) 20 MG capsule Take 20 mg by mouth 2 (two) times daily before a meal. 2 tabs by mouth per day      .  traMADol (ULTRAM) 50 MG tablet Take 1 tablet (50 mg total) by mouth 3 (three) times daily as needed.  270 tablet  1   No current facility-administered medications on file prior to visit.   Review of Systems Constitutional: Negative for increased diaphoresis, other activity, appetite or other siginficant weight change  HENT: Negative for worsening hearing loss, ear pain, facial swelling, mouth sores and neck stiffness.   Eyes: Negative for other worsening pain, redness or visual disturbance.  Respiratory: Negative for shortness of breath and wheezing.   Cardiovascular: Negative for chest pain and palpitations.  Gastrointestinal: Negative for diarrhea, blood in stool, abdominal distention or other pain Genitourinary: Negative for hematuria, flank pain or change in urine volume.  Musculoskeletal: Negative for myalgias or other joint complaints.  Skin: Negative for color change and wound.  Neurological: Negative for syncope and numbness. other than noted Hematological: Negative for adenopathy. or other swelling Psychiatric/Behavioral: Negative for hallucinations, self-injury, decreased concentration or other worsening agitation.      Objective:   Physical Exam BP 118/78  Pulse 66  Temp(Src) 97.6 F (36.4 C) (Oral)  Ht 5' 8.5" (1.74 m)  Wt 149 lb 4 oz (67.699 kg)  BMI 22.36 kg/m2  SpO2 92% VS noted,  Constitutional: Pt is oriented to person, place, and time. Appears well-developed and well-nourished.  Head: Normocephalic and atraumatic.  Right Ear: External ear normal.  Left Ear: External ear normal.  Nose: Nose normal.  Mouth/Throat: Oropharynx is clear and moist.  Eyes: Conjunctivae and EOM are normal. Pupils are equal, round, and reactive to light.  Neck: Normal range of motion. Neck supple. No JVD present. No tracheal deviation present.  Cardiovascular: Normal rate, regular rhythm, normal heart sounds and intact distal pulses.   Pulmonary/Chest: Effort normal and breath sounds  without rales or wheezing  Abdominal: Soft. Bowel sounds are normal. NT. No HSM  Musculoskeletal: Normal range of motion. Exhibits no edema.  Lymphadenopathy:  Has no cervical adenopathy.  Neurological: Pt is alert and oriented to person, place, and time. Pt has normal reflexes. No cranial nerve deficit. Motor grossly intact Skin: Skin is warm and dry. No rash noted.  Psychiatric:  Has normal mood and affect. Behavior is normal.     Assessment & Plan:

## 2014-01-18 NOTE — Assessment & Plan Note (Signed)
stable overall by history and exam, recent data reviewed with pt, and pt to continue medical treatment as before,  to f/u any worsening symptoms or concerns Lab Results  Component Value Date   LDLCALC 83 01/15/2014

## 2014-01-18 NOTE — Assessment & Plan Note (Signed)
stable overall by history and exam, recent data reviewed with pt, and pt to continue medical treatment as before,  to f/u any worsening symptoms or concerns SpO2 Readings from Last 3 Encounters:  01/15/14 92%  11/06/13 95%  10/14/13 96%

## 2014-01-18 NOTE — Assessment & Plan Note (Signed)
stable overall by history and exam, and pt to continue medical treatment as before,  to f/u any worsening symptoms or concerns 

## 2014-01-18 NOTE — Assessment & Plan Note (Signed)
stable overall by history and exam, recent data reviewed with pt, and pt to continue medical treatment as before,  to f/u any worsening symptoms or concerns BP Readings from Last 3 Encounters:  01/15/14 118/78  11/06/13 148/58  10/21/13 135/75

## 2014-01-19 ENCOUNTER — Ambulatory Visit (HOSPITAL_BASED_OUTPATIENT_CLINIC_OR_DEPARTMENT_OTHER): Payer: Medicare Other | Admitting: Physical Medicine & Rehabilitation

## 2014-01-19 ENCOUNTER — Encounter: Payer: Medicare Other | Attending: Registered Nurse

## 2014-01-19 ENCOUNTER — Encounter: Payer: Self-pay | Admitting: Physical Medicine & Rehabilitation

## 2014-01-19 ENCOUNTER — Other Ambulatory Visit (INDEPENDENT_AMBULATORY_CARE_PROVIDER_SITE_OTHER): Payer: Medicare Other

## 2014-01-19 VITALS — BP 91/69 | HR 66 | Resp 14 | Wt 148.4 lb

## 2014-01-19 DIAGNOSIS — M1611 Unilateral primary osteoarthritis, right hip: Secondary | ICD-10-CM

## 2014-01-19 DIAGNOSIS — E875 Hyperkalemia: Secondary | ICD-10-CM

## 2014-01-19 LAB — POTASSIUM: Potassium: 4.8 mEq/L (ref 3.5–5.1)

## 2014-01-19 MED ORDER — HYDROCODONE-ACETAMINOPHEN 5-325 MG PO TABS: 1.0000 | ORAL_TABLET | Freq: Two times a day (BID) | ORAL | Status: DC

## 2014-01-19 NOTE — Progress Notes (Signed)
Right intra articular hip With ultrasound guidance  Indication Right hip OA. Exam has tenderness over the right groin and with weightbearing. Pain has not responded to conservative care such as exercise therapy and oral medications. Pain interferes with sleep or with mobility Informed consent was obtained after describing risks and benefits of the procedure with the patient these include bleeding bruising and infection. Patient has signed written consent form. Patient placed in a supine.Femoral neck was imaged with linear transducer, 25 g 1.5 inch needle inserted with 2.5 ml lidocaine 1% injected into skin and sub q tissue,then 22g 45mm echo bloc needle inserted with direct longitudinal views into R hip joint capsule, bone contact. Needle slightly withdrawn then 6mg  of betamethasone with 4 cc 1% lidocaine were injected. Patient tolerated procedure well. Post procedure instructions given.

## 2014-01-19 NOTE — Patient Instructions (Signed)
Joint Injection  Care After  Refer to this sheet in the next few days. These instructions provide you with information on caring for yourself after you have had a joint injection. Your caregiver also may give you more specific instructions. Your treatment has been planned according to current medical practices, but problems sometimes occur. Call your caregiver if you have any problems or questions after your procedure.  After any type of joint injection, it is not uncommon to experience:  · Soreness, swelling, or bruising around the injection site.  · Mild numbness, tingling, or weakness around the injection site caused by the numbing medicine used before or with the injection.  It also is possible to experience the following effects associated with the specific agent after injection:  · Iodine-based contrast agents:  ¨ Allergic reaction (itching, hives, widespread redness, and swelling beyond the injection site).  · Corticosteroids (These effects are rare.):  ¨ Allergic reaction.  ¨ Increased blood sugar levels (If you have diabetes and you notice that your blood sugar levels have increased, notify your caregiver).  ¨ Increased blood pressure levels.  ¨ Mood swings.  · Hyaluronic acid in the use of viscosupplementation.  ¨ Temporary heat or redness.  ¨ Temporary rash and itching.  ¨ Increased fluid accumulation in the injected joint.  These effects all should resolve within a day after your procedure.   HOME CARE INSTRUCTIONS  · Limit yourself to light activity the day of your procedure. Avoid lifting heavy objects, bending, stooping, or twisting.  · Take prescription or over-the-counter pain medication as directed by your caregiver.  · You may apply ice to your injection site to reduce pain and swelling the day of your procedure. Ice may be applied 03-04 times:  ¨ Put ice in a plastic bag.  ¨ Place a towel between your skin and the bag.  ¨ Leave the ice on for no longer than 15-20 minutes each time.  SEEK  IMMEDIATE MEDICAL CARE IF:   · Pain and swelling get worse rather than better or extend beyond the injection site.  · Numbness does not go away.  · Blood or fluid continues to leak from the injection site.  · You have chest pain.  · You have swelling of your face or tongue.  · You have trouble breathing or you become dizzy.  · You develop a fever, chills, or severe tenderness at the injection site that last longer than 1 day.  MAKE SURE YOU:  · Understand these instructions.  · Watch your condition.  · Get help right away if you are not doing well or if you get worse.  Document Released: 12/08/2010 Document Revised: 06/19/2011 Document Reviewed: 12/08/2010  Exit[employer]® Patient Information ©2015 [employer]. This information is not intended to replace advice given to you by your health care provider. Make sure you discuss any questions you have with your health care provider.

## 2014-01-20 ENCOUNTER — Encounter: Payer: Self-pay | Admitting: Physical Medicine & Rehabilitation

## 2014-02-17 ENCOUNTER — Other Ambulatory Visit: Payer: Self-pay | Admitting: Internal Medicine

## 2014-02-17 ENCOUNTER — Encounter: Payer: Self-pay | Admitting: Physical Medicine & Rehabilitation

## 2014-02-17 ENCOUNTER — Encounter: Payer: Medicare Other | Attending: Registered Nurse

## 2014-02-17 ENCOUNTER — Ambulatory Visit (HOSPITAL_BASED_OUTPATIENT_CLINIC_OR_DEPARTMENT_OTHER): Payer: Medicare Other | Admitting: Physical Medicine & Rehabilitation

## 2014-02-17 ENCOUNTER — Other Ambulatory Visit: Payer: Self-pay | Admitting: Physical Medicine & Rehabilitation

## 2014-02-17 VITALS — BP 120/84 | HR 67 | Resp 14 | Wt 147.0 lb

## 2014-02-17 DIAGNOSIS — G894 Chronic pain syndrome: Secondary | ICD-10-CM

## 2014-02-17 DIAGNOSIS — Z5181 Encounter for therapeutic drug level monitoring: Secondary | ICD-10-CM

## 2014-02-17 DIAGNOSIS — I2510 Atherosclerotic heart disease of native coronary artery without angina pectoris: Secondary | ICD-10-CM | POA: Diagnosis not present

## 2014-02-17 DIAGNOSIS — Z79899 Other long term (current) drug therapy: Secondary | ICD-10-CM | POA: Diagnosis not present

## 2014-02-17 DIAGNOSIS — M1711 Unilateral primary osteoarthritis, right knee: Secondary | ICD-10-CM | POA: Insufficient documentation

## 2014-02-17 DIAGNOSIS — M1611 Unilateral primary osteoarthritis, right hip: Secondary | ICD-10-CM | POA: Insufficient documentation

## 2014-02-17 MED ORDER — HYDROCODONE-ACETAMINOPHEN 5-325 MG PO TABS: 1.0000 | ORAL_TABLET | Freq: Two times a day (BID) | ORAL | Status: DC

## 2014-02-17 NOTE — Progress Notes (Signed)
Subjective:    Patient ID: Kristopher [name], male    DOB: 1930/05/16, 80 y.o.   MRN: [id number]  HPI Right hip injection 10/12 helpful R knee is still doing ok after injection in May 2015  Medications helping with pain relief  Pain Inventory Average Pain 3 Pain Right Now 3 My pain is constant  In the last 24 hours, has pain interfered with the following? General activity 0 Relation with others 0 Enjoyment of life 0 What TIME of day is your pain at its worst? varies Sleep (in general) Good  Pain is worse with: walking and some activites Pain improves with: pacing activities and medication Relief from Meds: 8  Mobility walk without assistance use a cane  Function retired  Neuro/Psych trouble walking  Prior Studies Any changes since last visit?  no  Physicians involved in your care Any changes since last visit?  no   Family History  Problem Relation Age of Onset  . Heart disease Mother   . Heart disease Father    History   Social History  . Marital Status: Widowed    Spouse Name: N/A    Number of Children: N/A  . Years of Education: GED   Occupational History  . retired    Social History Main Topics  . Smoking status: Former Smoker -- 3.00 packs/day for 38 years    Quit date: 04/10/1988  . Smokeless tobacco: Never Used  . Alcohol Use: No  . Drug Use: No  . Sexual Activity: None   Other Topics Concern  . None   Social History Narrative   Past Surgical History  Procedure Laterality Date  . Pci of lad  05/1996    STENT X 4 TOTAL  . Hemorrhoid surgery  1954  . Cataract extraction w/ intraocular lens  implant, bilateral  YRS AGO  . Vasectomy  1968  . Transurethral resection of prostate  08/11/2011    Procedure: TRANSURETHRAL RESECTION OF THE PROSTATE WITH GYRUS INSTRUMENTS;  Surgeon: Fredricka Bonine, MD;  Location: [facility];  Service: Urology;  Laterality: N/A;     . Coronary angioplasty with stent placement     Past Medical History    Diagnosis Date  . HTN (hypertension)   . CAD (coronary artery disease)     s/p PCI of LAD 1998;  s/p DES to RCA 05/2009;  Myoview 4/13: low risk, no ischemia, EF 56%  . Aortic stenosis     echo 4/12: normal LVF, mild LAE, no significant AS  . HLD (hyperlipidemia)   . PAD (peripheral artery disease)     s/p L CIA stent 09/2011 ([facility])  . Renal artery stenosis     s/p bilat RA stents  . Esophageal reflux   . DJD (degenerative joint disease), lumbar 07/06/2011  . Urine incontinence   . Acute myocardial infarction, unspecified site, episode of care unspecified 1998  . COPD (chronic obstructive pulmonary disease)   . AAA (abdominal aortic aneurysm)     ultrasound 07/2011:  2.7 x 2.8 cm   BP 120/84 mmHg  Pulse 67  Resp 14  Wt 147 lb (66.679 kg)  SpO2 93%  Opioid Risk Score:   Fall Risk Score: Moderate Fall Risk (6-13 points) (previoulsy educated and given handout) Review of Systems  Musculoskeletal: Positive for gait problem.  All other systems reviewed and are negative.      Objective:   Physical Exam  Constitutional: He is oriented to person, place, and time.  Musculoskeletal:  Right hip: He exhibits decreased range of motion.       Right knee: He exhibits no swelling, no effusion and no deformity. Tenderness found. Patellar tendon tenderness noted.       Right ankle: He exhibits decreased range of motion. Achilles tendon normal.       Left ankle: Normal.  Right hip pain with internal and external rotation.  Neurological: He is alert and oriented to person, place, and time. He has normal strength.  5/5 bilateral hip flexor knee extensor and ankle dorsiflexor strength  Psychiatric: He has a normal mood and affect.          Assessment & Plan:  1. Right hip osteoarthritis with excellent response to right hip intra-articular injection with ultrasound guidance. We discussed that a repeat injection can be performed in 2 months if pain recurs.  2. Right knee  osteoarthritis has had prolonged response to prior steroid injection. This was done in May and can be repeated when symptoms return.  Doing well with pain medications no sign of aberrant drug behavior. In fact patient taking less than allotted. Currently prescribed tramadol 50 3 times a day sometimes takes twice a day Currently prescribed hydrocodone 5 mg twice a day, will take only 1 on a "good day"  Return to clinic one month

## 2014-02-17 NOTE — Patient Instructions (Addendum)
May need to repeat hip injection in 2 months  May repeat Right knee injection in 1 month if needed

## 2014-02-18 LAB — PMP ALCOHOL METABOLITE (ETG): Ethyl Glucuronide (EtG): NEGATIVE ng/mL

## 2014-02-19 LAB — OPIATES/OPIOIDS (LC/MS-MS)
Codeine Urine: NEGATIVE ng/mL (ref <50)
Hydrocodone: 1076 ng/mL (ref <50)
Hydromorphone: 187 ng/mL (ref <50)
Morphine Urine: NEGATIVE ng/mL (ref <50)
Norhydrocodone, Ur: 1083 ng/mL (ref <50)
Noroxycodone, Ur: NEGATIVE ng/mL (ref <50)
Oxycodone, ur: NEGATIVE ng/mL (ref <50)
Oxymorphone: NEGATIVE ng/mL (ref <50)

## 2014-02-19 LAB — TRAMADOL, URINE
N-DESMETHYL-CIS-TRAMADOL: 780 ng/mL (ref <100)
Tramadol, Urine: 3639 ng/mL (ref <100)

## 2014-02-20 LAB — PRESCRIPTION MONITORING PROFILE (SOLSTAS)
Amphetamine/Meth: NEGATIVE ng/mL
Barbiturate Screen, Urine: NEGATIVE ng/mL
Benzodiazepine Screen, Urine: NEGATIVE ng/mL
Buprenorphine, Urine: NEGATIVE ng/mL
Cannabinoid Scrn, Ur: NEGATIVE ng/mL
Carisoprodol, Urine: NEGATIVE ng/mL
Cocaine Metabolites: NEGATIVE ng/mL
Creatinine, Urine: 129.55 mg/dL (ref >20.0)
Fentanyl, Ur: NEGATIVE ng/mL
MDMA URINE: NEGATIVE ng/mL
Meperidine, Ur: NEGATIVE ng/mL
Methadone Screen, Urine: NEGATIVE ng/mL
Nitrites, Initial: NEGATIVE ug/mL
Oxycodone Screen, Ur: NEGATIVE ng/mL
Propoxyphene: NEGATIVE ng/mL
Tapentadol, urine: NEGATIVE ng/mL
Zolpidem, Urine: NEGATIVE ng/mL
pH, Initial: 5 pH (ref 4.5–8.9)

## 2014-03-19 ENCOUNTER — Encounter (HOSPITAL_COMMUNITY): Payer: Self-pay | Admitting: Cardiovascular Disease

## 2014-03-23 ENCOUNTER — Other Ambulatory Visit: Payer: Self-pay | Admitting: Cardiology

## 2014-03-24 ENCOUNTER — Encounter: Payer: Self-pay | Admitting: Physical Medicine & Rehabilitation

## 2014-03-24 ENCOUNTER — Ambulatory Visit (HOSPITAL_BASED_OUTPATIENT_CLINIC_OR_DEPARTMENT_OTHER): Payer: Medicare Other | Admitting: Physical Medicine & Rehabilitation

## 2014-03-24 ENCOUNTER — Encounter: Payer: Medicare Other | Attending: Registered Nurse

## 2014-03-24 VITALS — BP 106/55 | HR 58 | Resp 14 | Wt 147.2 lb

## 2014-03-24 DIAGNOSIS — Z79899 Other long term (current) drug therapy: Secondary | ICD-10-CM | POA: Insufficient documentation

## 2014-03-24 DIAGNOSIS — I2510 Atherosclerotic heart disease of native coronary artery without angina pectoris: Secondary | ICD-10-CM | POA: Diagnosis not present

## 2014-03-24 DIAGNOSIS — M1611 Unilateral primary osteoarthritis, right hip: Secondary | ICD-10-CM | POA: Diagnosis not present

## 2014-03-24 DIAGNOSIS — M25561 Pain in right knee: Secondary | ICD-10-CM

## 2014-03-24 DIAGNOSIS — G894 Chronic pain syndrome: Secondary | ICD-10-CM | POA: Insufficient documentation

## 2014-03-24 DIAGNOSIS — M222X1 Patellofemoral disorders, right knee: Secondary | ICD-10-CM

## 2014-03-24 DIAGNOSIS — Z5181 Encounter for therapeutic drug level monitoring: Secondary | ICD-10-CM | POA: Diagnosis not present

## 2014-03-24 DIAGNOSIS — M1711 Unilateral primary osteoarthritis, right knee: Secondary | ICD-10-CM | POA: Insufficient documentation

## 2014-03-24 DIAGNOSIS — M720 Palmar fascial fibromatosis [Dupuytren]: Secondary | ICD-10-CM | POA: Diagnosis not present

## 2014-03-24 MED ORDER — ACETAMINOPHEN-CODEINE #4 300-60 MG PO TABS: 1.0000 | ORAL_TABLET | Freq: Three times a day (TID) | ORAL | Status: DC | PRN

## 2014-03-24 MED ORDER — HYDROCODONE-ACETAMINOPHEN 5-325 MG PO TABS: 1.0000 | ORAL_TABLET | Freq: Two times a day (BID) | ORAL | Status: DC

## 2014-03-24 NOTE — Progress Notes (Signed)
Subjective:    Patient ID: Kristopher [name], male    DOB: 09-27-1930, 80 y.o.   MRN: [id number]  HPI Right knee brace helpful, doesn't feel like another injection needed  RIght Hip injection still working  Hands draw up at night about 5 nites a week, some numbness, doesn't awaken him from sleep.   Pain Inventory Average Pain 4 Pain Right Now 3 My pain is intermittent  In the last 24 hours, has pain interfered with the following? General activity 2 Relation with others 2 Enjoyment of life 2 What TIME of day is your pain at its worst? evening Sleep (in general) n/a  Pain is worse with: na Pain improves with: medication Relief from Meds: 8  Mobility use a cane how many minutes can you walk? 10-15 ability to climb steps?  yes do you drive?  yes  Function retired  Neuro/Psych No problems in this area  Prior Studies Any changes since last visit?  no  Physicians involved in your care Any changes since last visit?  no   Family History  Problem Relation Age of Onset  . Heart disease Mother   . Heart disease Father    History   Social History  . Marital Status: Widowed    Spouse Name: N/A    Number of Children: N/A  . Years of Education: GED   Occupational History  . retired    Social History Main Topics  . Smoking status: Former Smoker -- 3.00 packs/day for 38 years    Quit date: 04/10/1988  . Smokeless tobacco: Never Used  . Alcohol Use: No  . Drug Use: No  . Sexual Activity: None   Other Topics Concern  . None   Social History Narrative   Past Surgical History  Procedure Laterality Date  . Pci of lad  05/1996    STENT X 4 TOTAL  . Hemorrhoid surgery  1954  . Cataract extraction w/ intraocular lens  implant, bilateral  YRS AGO  . Vasectomy  1968  . Transurethral resection of prostate  08/11/2011    Procedure: TRANSURETHRAL RESECTION OF THE PROSTATE WITH GYRUS INSTRUMENTS;  Surgeon: Fredricka Bonine, MD;  Location: [facility];  Service:  Urology;  Laterality: N/A;     . Coronary angioplasty with stent placement    . Lower extremity angiogram Bilateral 09/20/2011    Procedure: LOWER EXTREMITY ANGIOGRAM;  Surgeon: Wellington Hampshire, MD;  Location: [facility];  Service: Cardiovascular;  Laterality: Bilateral;   Past Medical History  Diagnosis Date  . HTN (hypertension)   . CAD (coronary artery disease)     s/p PCI of LAD 1998;  s/p DES to RCA 05/2009;  Myoview 4/13: low risk, no ischemia, EF 56%  . Aortic stenosis     echo 4/12: normal LVF, mild LAE, no significant AS  . HLD (hyperlipidemia)   . PAD (peripheral artery disease)     s/p L CIA stent 09/2011 ([facility])  . Renal artery stenosis     s/p bilat RA stents  . Esophageal reflux   . DJD (degenerative joint disease), lumbar 07/06/2011  . Urine incontinence   . Acute myocardial infarction, unspecified site, episode of care unspecified 1998  . COPD (chronic obstructive pulmonary disease)   . AAA (abdominal aortic aneurysm)     ultrasound 07/2011:  2.7 x 2.8 cm   bp- 106/55  p-58 r-14 o2sat 94% Opioid Risk Score:   Fall Risk Score: Moderate Fall Risk (6-13 points) (previously educated  and given handout) Review of Systems  Musculoskeletal:       Knee pain  All other systems reviewed and are negative.      Objective:   Physical Exam  Constitutional: He is oriented to person, place, and time.  Musculoskeletal:  Heberden's nodules bilateral hands  Right hip: He exhibits decreased range of motion. No pain Right knee: He exhibits no swelling, no effusion and no deformity. Tenderness found. Patellar tendon tenderness noted.  Right ankle: He exhibits decreased range of motion. Achilles tendon normal.  Left ankle: Normal.   Neurological: He is alert and oriented to person, place, and time. He has normal strength.  5/5 bilateral hip flexor knee extensor and ankle dorsiflexor strength  Psychiatric: He has a normal mood and affect.        Assessment & Plan:  1.  Right hip osteoarthritis with excellent response to right hip intra-articular injection with ultrasound guidance. We discussed that a repeat injection can be performed in 2 months if pain recurs.  2. Right knee osteoarthritis has had prolonged response to prior steroid injection. This was done in May and can be repeated when symptoms return.  Doing well with pain medications no sign of aberrant drug behavior. In fact patient taking less than allotted.  Currently prescribed tramadol 50 3 times a day sometimes takes twice a day  Currently prescribed hydrocodone 5 mg twice a day, will take only 1 on a "good day"   Given that the patient gets by on fairly modest doses of hydrocodone, will trial Tylenol No. 4 one tablet by mouth twice a day for about 5 days to evaluate whether a switch can be made Patient agrees with this plan Return to clinic 5-6 weeks

## 2014-03-24 NOTE — Patient Instructions (Signed)
Try substituting Tylenol 4 for the hydrocodone for several days to see if it is helpful in controlling her pain

## 2014-03-31 ENCOUNTER — Other Ambulatory Visit: Payer: Self-pay | Admitting: Cardiology

## 2014-05-05 ENCOUNTER — Ambulatory Visit: Payer: Medicare Other | Admitting: Physical Medicine & Rehabilitation

## 2014-05-07 ENCOUNTER — Encounter: Payer: Self-pay | Admitting: Physical Medicine & Rehabilitation

## 2014-05-07 ENCOUNTER — Other Ambulatory Visit: Payer: Self-pay | Admitting: Physical Medicine & Rehabilitation

## 2014-05-07 ENCOUNTER — Encounter: Payer: Medicare Other | Attending: Registered Nurse

## 2014-05-07 ENCOUNTER — Ambulatory Visit (HOSPITAL_BASED_OUTPATIENT_CLINIC_OR_DEPARTMENT_OTHER): Payer: Medicare Other | Admitting: Physical Medicine & Rehabilitation

## 2014-05-07 VITALS — BP 143/61 | HR 69 | Resp 14

## 2014-05-07 DIAGNOSIS — G894 Chronic pain syndrome: Secondary | ICD-10-CM | POA: Diagnosis not present

## 2014-05-07 DIAGNOSIS — Z5181 Encounter for therapeutic drug level monitoring: Secondary | ICD-10-CM | POA: Insufficient documentation

## 2014-05-07 DIAGNOSIS — M1711 Unilateral primary osteoarthritis, right knee: Secondary | ICD-10-CM | POA: Insufficient documentation

## 2014-05-07 DIAGNOSIS — M222X1 Patellofemoral disorders, right knee: Secondary | ICD-10-CM

## 2014-05-07 DIAGNOSIS — Z79899 Other long term (current) drug therapy: Secondary | ICD-10-CM | POA: Insufficient documentation

## 2014-05-07 DIAGNOSIS — M1611 Unilateral primary osteoarthritis, right hip: Secondary | ICD-10-CM

## 2014-05-07 DIAGNOSIS — M25561 Pain in right knee: Secondary | ICD-10-CM

## 2014-05-07 MED ORDER — HYDROCODONE-ACETAMINOPHEN 5-325 MG PO TABS: 1.0000 | ORAL_TABLET | Freq: Three times a day (TID) | ORAL | Status: DC

## 2014-05-07 NOTE — Progress Notes (Signed)
Subjective:    Patient ID: Kristopher [name], male    DOB: 1930/07/14, 81 y.o.   MRN: [id number]  HPI Tylenol No. 4 not helpful for his pain Wonders if he can simplify his regimen. He like to take a little more hydrocodone and stop the tramadol.  Pain Inventory Average Pain 4 Pain Right Now 3 My pain is intermittent  In the last 24 hours, has pain interfered with the following? General activity 2 Relation with others 2 Enjoyment of life 2 What TIME of day is your pain at its worst? evening Sleep (in general) Fair  Pain is worse with: unsure Pain improves with: medication and injections Relief from Meds: 8  Mobility walk with assistance use a cane how many minutes can you walk? 15-20 ability to climb steps?  yes do you drive?  yes  Function retired  Neuro/Psych trouble walking  Prior Studies Any changes since last visit?  no  Physicians involved in your care Any changes since last visit?  no   Family History  Problem Relation Age of Onset  . Heart disease Mother   . Heart disease Father    History   Social History  . Marital Status: Widowed    Spouse Name: N/A    Number of Children: N/A  . Years of Education: GED   Occupational History  . retired    Social History Main Topics  . Smoking status: Former Smoker -- 3.00 packs/day for 38 years    Quit date: 04/10/1988  . Smokeless tobacco: Never Used  . Alcohol Use: No  . Drug Use: No  . Sexual Activity: None   Other Topics Concern  . None   Social History Narrative   Past Surgical History  Procedure Laterality Date  . Pci of lad  05/1996    STENT X 4 TOTAL  . Hemorrhoid surgery  1954  . Cataract extraction w/ intraocular lens  implant, bilateral  YRS AGO  . Vasectomy  1968  . Transurethral resection of prostate  08/11/2011    Procedure: TRANSURETHRAL RESECTION OF THE PROSTATE WITH GYRUS INSTRUMENTS;  Surgeon: Fredricka Bonine, MD;  Location: [facility];  Service: Urology;  Laterality: N/A;       . Coronary angioplasty with stent placement    . Lower extremity angiogram Bilateral 09/20/2011    Procedure: LOWER EXTREMITY ANGIOGRAM;  Surgeon: Wellington Hampshire, MD;  Location: [facility];  Service: Cardiovascular;  Laterality: Bilateral;   Past Medical History  Diagnosis Date  . HTN (hypertension)   . CAD (coronary artery disease)     s/p PCI of LAD 1998;  s/p DES to RCA 05/2009;  Myoview 4/13: low risk, no ischemia, EF 56%  . Aortic stenosis     echo 4/12: normal LVF, mild LAE, no significant AS  . HLD (hyperlipidemia)   . PAD (peripheral artery disease)     s/p L CIA stent 09/2011 ([facility])  . Renal artery stenosis     s/p bilat RA stents  . Esophageal reflux   . DJD (degenerative joint disease), lumbar 07/06/2011  . Urine incontinence   . Acute myocardial infarction, unspecified site, episode of care unspecified 1998  . COPD (chronic obstructive pulmonary disease)   . AAA (abdominal aortic aneurysm)     ultrasound 07/2011:  2.7 x 2.8 cm   BP 143/61 mmHg  Pulse 69  Resp 14  SpO2 97%  Opioid Risk Score:   Fall Risk Score: Moderate Fall Risk (6-13 points) (previoulsy  educated and givne handout) Review of Systems  Musculoskeletal: Positive for gait problem.  All other systems reviewed and are negative.      Objective:   Physical Exam  Constitutional: He is oriented to person, place, and time. He appears well-developed and well-nourished.  HENT:  Head: Normocephalic.  Musculoskeletal:       Right knee: He exhibits normal range of motion, no LCL laxity, normal patellar mobility and no MCL laxity. Tenderness found. Medial joint line tenderness noted. No lateral joint line tenderness noted.  Neurological: He is alert and oriented to person, place, and time.  Psychiatric: He has a normal mood and affect.  Nursing note and vitals reviewed.  Ambulates with a quad cane       Assessment & Plan:  1. Chronic pain syndrome multifactorial pain. He does have right hip pain  which is well relieved after intra-articular injection under ultrasound guidance. In addition he has a history of right knee osteoarthritis mainly patellofemoral which has responded in the past to injection. He feels like he may need another one since the last one was performed in May. In addition patient complains of numbness type sensation as well as pain that radiates from from the knee down to the anterior leg as well as the anterior thigh. This is not completely consistent with his osteoarthritis although some of the radiating pain may be.  We discussed medication management. Did not get benefit from Tylenol No. 4. Asking to simplify medication regimen. Discontinue tramadol Increase hydrocodone 5 mg 3 times a day Monthly visits to monitor given age, fall risk and C2 medication

## 2014-05-07 NOTE — Patient Instructions (Signed)
Stopped Tramadol  Increased Hydrocodone 5mg  THree times a day

## 2014-05-09 LAB — PMP ALCOHOL METABOLITE (ETG): Ethyl Glucuronide (EtG): NEGATIVE ng/mL

## 2014-05-14 LAB — OPIATES/OPIOIDS (LC/MS-MS)
Codeine Urine: NEGATIVE ng/mL (ref <50)
Hydrocodone: 722 ng/mL (ref <50)
Hydromorphone: 114 ng/mL (ref <50)
Morphine Urine: NEGATIVE ng/mL (ref <50)
Norhydrocodone, Ur: 658 ng/mL (ref <50)
Noroxycodone, Ur: NEGATIVE ng/mL (ref <50)
Oxycodone, ur: NEGATIVE ng/mL (ref <50)
Oxymorphone: NEGATIVE ng/mL (ref <50)

## 2014-05-14 LAB — TRAMADOL, URINE
N-DESMETHYL-CIS-TRAMADOL: 2058 ng/mL — AB (ref <100)
Tramadol, Urine: 7336 ng/mL — AB (ref <100)

## 2014-05-15 LAB — PRESCRIPTION MONITORING PROFILE (SOLSTAS)
Amphetamine/Meth: NEGATIVE ng/mL
Barbiturate Screen, Urine: NEGATIVE ng/mL
Benzodiazepine Screen, Urine: NEGATIVE ng/mL
Buprenorphine, Urine: NEGATIVE ng/mL
Cannabinoid Scrn, Ur: NEGATIVE ng/mL
Carisoprodol, Urine: NEGATIVE ng/mL
Cocaine Metabolites: NEGATIVE ng/mL
Creatinine, Urine: 89.7 mg/dL (ref >20.0)
Fentanyl, Ur: NEGATIVE ng/mL
MDMA URINE: NEGATIVE ng/mL
Meperidine, Ur: NEGATIVE ng/mL
Methadone Screen, Urine: NEGATIVE ng/mL
Nitrites, Initial: NEGATIVE ug/mL
Oxycodone Screen, Ur: NEGATIVE ng/mL
Propoxyphene: NEGATIVE ng/mL
Tapentadol, urine: NEGATIVE ng/mL
Zolpidem, Urine: NEGATIVE ng/mL
pH, Initial: 5.1 pH (ref 4.5–8.9)

## 2014-05-18 NOTE — Progress Notes (Signed)
HPI: FU CAD, s/p PCI of his LAD performed in February 1998. He also has PVD and has had prior bilateral renal artery stenting. Cardiac catheterization in February 2011: EF 60% and no renal artery stenosis, RCA 90% but no obstructive disease in the left system. He had PCI of his right coronary artery with a drug-eluting stent at that time. Echocardiogram in April of 2012 revealed normal LV function, mild LAE, no significant AS. Myoview 07/31/11: low risk, no ischemia, EF 56%. LE angiogram 09/20/11: Small infrarenal aortic aneurysm, no renal artery stenosis, ostial right CIA 60%, proximal left CIA severely calcified with 80-90%, left EIA occluded and filled by collats. He underwent intervention with angioplasty and stenting to the left CIA. 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 Followup recommended one year. Also with h/o asymptomatic Mobitz 1. Since last seen,   Current Outpatient Prescriptions  Medication Sig Dispense Refill  . acetaminophen-codeine (TYLENOL #4) 300-60 MG per tablet Take 1 tablet by mouth every 8 (eight) hours as needed for moderate pain. (Patient not taking: Reported on 05/07/2014) 10 tablet 0  . Ascorbic Acid (VITAMIN C) 500 MG tablet Take 500 mg by mouth 2 (two) times daily.     Marland Kitchen aspirin 325 MG EC tablet Take 325 mg by mouth daily.    Marland Kitchen atorvastatin (LIPITOR) 80 MG tablet TAKE 1 TABLET DAILY. 90 tablet 0  . BEE POLLEN PO Take by mouth.    Marland Kitchen Co-Enzyme Q-10 100 MG CAPS Take 1 capsule by mouth daily.    . diclofenac sodium (VOLTAREN) 1 % GEL Apply 2 g topically 4 (four) times daily. 9 Tube 1  . fish oil-omega-3 fatty acids 1000 MG capsule Take 1 g by mouth daily.     Marland Kitchen HYDROcodone-acetaminophen (NORCO/VICODIN) 5-325 MG per tablet Take 1 tablet by mouth 3 (three) times daily before meals. 90 tablet 0  . Multiple Vitamin (MULTIVITAMIN) capsule Take 1 capsule by mouth daily.       . nitroGLYCERIN (NITROSTAT) 0.4 MG SL tablet Place 1 tablet (0.4 mg total) under the tongue every 5 (five) minutes as needed for chest pain. 25 tablet 12  . omeprazole (PRILOSEC) 20 MG capsule TAKE 2 CAPSULES DAILY 180 capsule 3  . ramipril (ALTACE) 10 MG capsule TAKE 1 CAPSULE DAILY WITH BREAKFAST 90 capsule 0   No current facility-administered medications for this visit.     Past Medical History  Diagnosis Date  . HTN (hypertension)   . CAD (coronary artery disease)     s/p PCI of LAD 1998;  s/p DES to RCA 05/2009;  Myoview 4/13: low risk, no ischemia, EF 56%  . Aortic stenosis     echo 4/12: normal LVF, mild LAE, no significant AS  . HLD (hyperlipidemia)   . PAD (peripheral artery disease)     s/p L CIA stent 09/2011 ([facility])  . Renal artery stenosis     s/p bilat RA stents  . Esophageal reflux   . DJD (degenerative joint disease), lumbar 07/06/2011  . Urine incontinence   . Acute myocardial infarction, unspecified site, episode of care unspecified 1998  . COPD (chronic obstructive pulmonary disease)   . AAA (abdominal aortic aneurysm)     ultrasound 07/2011:  2.7 x 2.8 cm    Past Surgical History  Procedure Laterality Date  . Pci of lad  05/1996    STENT X 4 TOTAL  .  Hemorrhoid surgery  1954  . Cataract extraction w/ intraocular lens  implant, bilateral  YRS AGO  . Vasectomy  1968  . Transurethral resection of prostate  08/11/2011    Procedure: TRANSURETHRAL RESECTION OF THE PROSTATE WITH GYRUS INSTRUMENTS;  Surgeon: Fredricka Bonine, MD;  Location: [facility];  Service: Urology;  Laterality: N/A;     . Coronary angioplasty with stent placement    . Lower extremity angiogram Bilateral 09/20/2011    Procedure: LOWER EXTREMITY ANGIOGRAM;  Surgeon: Wellington Hampshire, MD;  Location: [facility];  Service: Cardiovascular;  Laterality: Bilateral;    History   Social History  . Marital Status: Widowed    Spouse Name: N/A    Number of Children: N/A  . Years of Education: GED    Occupational History  . retired    Social History Main Topics  . Smoking status: Former Smoker -- 3.00 packs/day for 38 years    Quit date: 04/10/1988  . Smokeless tobacco: Never Used  . Alcohol Use: No  . Drug Use: No  . Sexual Activity: Not on file   Other Topics Concern  . Not on file   Social History Narrative    ROS: no fevers or chills, productive cough, hemoptysis, dysphasia, odynophagia, melena, hematochezia, dysuria, hematuria, rash, seizure activity, orthopnea, PND, pedal edema, claudication. Remaining systems are negative.  Physical Exam: Well-developed well-nourished in no acute distress.  Skin is warm and dry.  HEENT is normal.  Neck is supple.  Chest is clear to auscultation with normal expansion.  Cardiovascular exam is regular rate and rhythm.  Abdominal exam nontender or distended. No masses palpated. Extremities show no edema. neuro grossly intact  ECG     This encounter was created in error - please disregard.

## 2014-05-19 ENCOUNTER — Encounter: Payer: Medicare Other | Admitting: Cardiology

## 2014-06-01 DIAGNOSIS — H01004 Unspecified blepharitis left upper eyelid: Secondary | ICD-10-CM | POA: Diagnosis not present

## 2014-06-01 DIAGNOSIS — H01001 Unspecified blepharitis right upper eyelid: Secondary | ICD-10-CM | POA: Diagnosis not present

## 2014-06-01 DIAGNOSIS — H3531 Nonexudative age-related macular degeneration: Secondary | ICD-10-CM | POA: Diagnosis not present

## 2014-06-01 DIAGNOSIS — H524 Presbyopia: Secondary | ICD-10-CM | POA: Diagnosis not present

## 2014-06-01 DIAGNOSIS — H04123 Dry eye syndrome of bilateral lacrimal glands: Secondary | ICD-10-CM | POA: Diagnosis not present

## 2014-06-04 ENCOUNTER — Encounter: Payer: Self-pay | Admitting: Physical Medicine & Rehabilitation

## 2014-06-04 ENCOUNTER — Other Ambulatory Visit: Payer: Self-pay | Admitting: Cardiology

## 2014-06-04 ENCOUNTER — Other Ambulatory Visit: Payer: Self-pay | Admitting: Physical Medicine & Rehabilitation

## 2014-06-04 ENCOUNTER — Ambulatory Visit (HOSPITAL_BASED_OUTPATIENT_CLINIC_OR_DEPARTMENT_OTHER): Payer: Medicare Other | Admitting: Physical Medicine & Rehabilitation

## 2014-06-04 ENCOUNTER — Encounter: Payer: Medicare Other | Attending: Registered Nurse

## 2014-06-04 VITALS — BP 166/84 | HR 66 | Resp 14

## 2014-06-04 DIAGNOSIS — Z5181 Encounter for therapeutic drug level monitoring: Secondary | ICD-10-CM

## 2014-06-04 DIAGNOSIS — M720 Palmar fascial fibromatosis [Dupuytren]: Secondary | ICD-10-CM

## 2014-06-04 DIAGNOSIS — M1711 Unilateral primary osteoarthritis, right knee: Secondary | ICD-10-CM | POA: Insufficient documentation

## 2014-06-04 DIAGNOSIS — Z79899 Other long term (current) drug therapy: Secondary | ICD-10-CM | POA: Insufficient documentation

## 2014-06-04 DIAGNOSIS — M222X1 Patellofemoral disorders, right knee: Secondary | ICD-10-CM | POA: Diagnosis not present

## 2014-06-04 DIAGNOSIS — M1611 Unilateral primary osteoarthritis, right hip: Secondary | ICD-10-CM

## 2014-06-04 DIAGNOSIS — G894 Chronic pain syndrome: Secondary | ICD-10-CM

## 2014-06-04 DIAGNOSIS — M25561 Pain in right knee: Secondary | ICD-10-CM

## 2014-06-04 MED ORDER — HYDROCODONE-ACETAMINOPHEN 5-325 MG PO TABS: 1.0000 | ORAL_TABLET | Freq: Three times a day (TID) | ORAL | Status: DC

## 2014-06-04 NOTE — Patient Instructions (Signed)
Please call if right thigh pain increases, we can order a lumbar MRI at that point

## 2014-06-04 NOTE — Progress Notes (Signed)
Subjective:    Patient ID: Kristopher [name], male    DOB: 01-14-1931, 81 y.o.   MRN: [id number]  HPI Last visit 05/07/2014 Right knee injection helpful Has pain with Light touch in Right anterior thigh and anterior leg On hydrocodone TID- has about 12 tabs from prior Rx No hip pain since injection 01/19/2014, intra artic US guided Pain Inventory Average Pain 4 Pain Right Now 6 My pain is constant, dull, tingling and aching  In the last 24 hours, has pain interfered with the following? General activity 5 Relation with others 5 Enjoyment of life 5 What TIME of day is your pain at its worst? depends on activity Sleep (in general) Good  Pain is worse with: walking, standing and some activites Pain improves with: rest and medication Relief from Meds: 4  Mobility use a cane how many minutes can you walk? 5-10 ability to climb steps?  yes do you drive?  yes  Function retired  Neuro/Psych tingling trouble walking  Prior Studies Any changes since last visit?  no  Physicians involved in your care Any changes since last visit?  no   Family History  Problem Relation Age of Onset  . Heart disease Mother   . Heart disease Father    History   Social History  . Marital Status: Widowed    Spouse Name: N/A  . Number of Children: N/A  . Years of Education: GED   Occupational History  . retired    Social History Main Topics  . Smoking status: Former Smoker -- 3.00 packs/day for 38 years    Quit date: 04/10/1988  . Smokeless tobacco: Never Used  . Alcohol Use: No  . Drug Use: No  . Sexual Activity: Not on file   Other Topics Concern  . None   Social History Narrative   Past Surgical History  Procedure Laterality Date  . Pci of lad  05/1996    STENT X 4 TOTAL  . Hemorrhoid surgery  1954  . Cataract extraction w/ intraocular lens  implant, bilateral  YRS AGO  . Vasectomy  1968  . Transurethral resection of prostate  08/11/2011    Procedure: TRANSURETHRAL  RESECTION OF THE PROSTATE WITH GYRUS INSTRUMENTS;  Surgeon: Fredricka Bonine, MD;  Location: [facility];  Service: Urology;  Laterality: N/A;     . Coronary angioplasty with stent placement    . Lower extremity angiogram Bilateral 09/20/2011    Procedure: LOWER EXTREMITY ANGIOGRAM;  Surgeon: Wellington Hampshire, MD;  Location: [facility];  Service: Cardiovascular;  Laterality: Bilateral;   Past Medical History  Diagnosis Date  . HTN (hypertension)   . CAD (coronary artery disease)     s/p PCI of LAD 1998;  s/p DES to RCA 05/2009;  Myoview 4/13: low risk, no ischemia, EF 56%  . Aortic stenosis     echo 4/12: normal LVF, mild LAE, no significant AS  . HLD (hyperlipidemia)   . PAD (peripheral artery disease)     s/p L CIA stent 09/2011 ([facility])  . Renal artery stenosis     s/p bilat RA stents  . Esophageal reflux   . DJD (degenerative joint disease), lumbar 07/06/2011  . Urine incontinence   . Acute myocardial infarction, unspecified site, episode of care unspecified 1998  . COPD (chronic obstructive pulmonary disease)   . AAA (abdominal aortic aneurysm)     ultrasound 07/2011:  2.7 x 2.8 cm   BP 166/84 mmHg  Pulse 66  Resp  14  SpO2 98%  Opioid Risk Score:   Fall Risk Score: Moderate Fall Risk (6-13 points)  Review of Systems  Constitutional: Negative.   HENT: Negative.   Eyes: Negative.   Respiratory: Negative.   Cardiovascular: Negative.   Gastrointestinal: Negative.   Endocrine: Negative.   Genitourinary: Negative.   Musculoskeletal: Positive for myalgias and arthralgias.       Right knee pain  Allergic/Immunologic: Negative.   Neurological:       Tingling, trouble walking  Hematological: Negative.   Psychiatric/Behavioral: Negative.        Objective:   Physical Exam  Constitutional: He is oriented to person, place, and time. He appears well-developed and well-nourished.  HENT:  Head: Normocephalic and atraumatic.  Musculoskeletal:       Right knee: He exhibits  decreased range of motion. Tenderness found. Medial joint line and lateral joint line tenderness noted.       Left knee: He exhibits decreased range of motion. No tenderness found.  Neurological: He is alert and oriented to person, place, and time. Gait abnormal.  Reflex Scores:      Patellar reflexes are 0 on the right side and 2+ on the left side.      Achilles reflexes are 0 on the right side and 0 on the left side. Quad cane, antalgic on Right    Psychiatric: He has a normal mood and affect.  Nursing note and vitals reviewed.         Assessment & Plan:  1. Patellofemoral arthritis has had good relief with the injection, may reinjection in 2 months if needed  2. Right hip arthritis still doing well with that 4 months after ultrasound-guided hip joint injection. Will monitor for increasing pain and inject again as needed  3. Right anterior thigh numbness loss of deep tendon reflex as well as some quad weakness. Patient denies any back pain. Symptoms suggest L3 radiculopathy but will continue monitoring as these complaints are not severe at the current time  If right anterior thigh symptoms progress will check MRI Of the lumbar spine  4. Chronic pain syndrome multifactorial in the right leg. We will continue hydrocodone 5 3 times per day.

## 2014-06-05 LAB — PMP ALCOHOL METABOLITE (ETG): Ethyl Glucuronide (EtG): NEGATIVE ng/mL

## 2014-06-05 NOTE — Progress Notes (Signed)
Urine drug screen for this encounter is consistent for prescribed medication. Tramadol is present but has been prescribed tramadol in past.

## 2014-06-09 LAB — OPIATES/OPIOIDS (LC/MS-MS)
Codeine Urine: NEGATIVE ng/mL (ref <50)
Hydrocodone: 2241 ng/mL (ref <50)
Hydromorphone: 346 ng/mL (ref <50)
Morphine Urine: NEGATIVE ng/mL (ref <50)
Norhydrocodone, Ur: 2411 ng/mL (ref <50)
Noroxycodone, Ur: NEGATIVE ng/mL (ref <50)
Oxycodone, ur: NEGATIVE ng/mL (ref <50)
Oxymorphone: NEGATIVE ng/mL (ref <50)

## 2014-06-10 ENCOUNTER — Other Ambulatory Visit: Payer: Self-pay | Admitting: Cardiology

## 2014-06-10 LAB — PRESCRIPTION MONITORING PROFILE (SOLSTAS)
Amphetamine/Meth: NEGATIVE ng/mL
Barbiturate Screen, Urine: NEGATIVE ng/mL
Benzodiazepine Screen, Urine: NEGATIVE ng/mL
Buprenorphine, Urine: NEGATIVE ng/mL
Cannabinoid Scrn, Ur: NEGATIVE ng/mL
Carisoprodol, Urine: NEGATIVE ng/mL
Cocaine Metabolites: NEGATIVE ng/mL
Creatinine, Urine: 129.64 mg/dL (ref >20.0)
Fentanyl, Ur: NEGATIVE ng/mL
MDMA URINE: NEGATIVE ng/mL
Meperidine, Ur: NEGATIVE ng/mL
Methadone Screen, Urine: NEGATIVE ng/mL
Nitrites, Initial: NEGATIVE ug/mL
Oxycodone Screen, Ur: NEGATIVE ng/mL
Propoxyphene: NEGATIVE ng/mL
Tapentadol, urine: NEGATIVE ng/mL
Tramadol Scrn, Ur: NEGATIVE ng/mL
Zolpidem, Urine: NEGATIVE ng/mL
pH, Initial: 5 pH (ref 4.5–8.9)

## 2014-06-10 NOTE — Telephone Encounter (Signed)
Rx has been sent to the pharmacy electronically. ° °

## 2014-06-18 ENCOUNTER — Ambulatory Visit (INDEPENDENT_AMBULATORY_CARE_PROVIDER_SITE_OTHER): Payer: Medicare Other | Admitting: Cardiology

## 2014-06-18 ENCOUNTER — Encounter (HOSPITAL_COMMUNITY): Payer: Self-pay | Admitting: *Deleted

## 2014-06-18 ENCOUNTER — Encounter: Payer: Self-pay | Admitting: Cardiology

## 2014-06-18 VITALS — BP 150/72 | HR 68 | Ht 68.0 in | Wt 151.7 lb

## 2014-06-18 DIAGNOSIS — R0602 Shortness of breath: Secondary | ICD-10-CM | POA: Diagnosis not present

## 2014-06-18 DIAGNOSIS — I2510 Atherosclerotic heart disease of native coronary artery without angina pectoris: Secondary | ICD-10-CM

## 2014-06-18 DIAGNOSIS — Z9861 Coronary angioplasty status: Secondary | ICD-10-CM

## 2014-06-18 DIAGNOSIS — R079 Chest pain, unspecified: Secondary | ICD-10-CM | POA: Diagnosis not present

## 2014-06-18 DIAGNOSIS — E785 Hyperlipidemia, unspecified: Secondary | ICD-10-CM | POA: Diagnosis not present

## 2014-06-18 DIAGNOSIS — I739 Peripheral vascular disease, unspecified: Secondary | ICD-10-CM | POA: Diagnosis not present

## 2014-06-18 DIAGNOSIS — G894 Chronic pain syndrome: Secondary | ICD-10-CM | POA: Diagnosis not present

## 2014-06-18 DIAGNOSIS — I10 Essential (primary) hypertension: Secondary | ICD-10-CM

## 2014-06-18 DIAGNOSIS — I441 Atrioventricular block, second degree: Secondary | ICD-10-CM | POA: Diagnosis not present

## 2014-06-18 DIAGNOSIS — I4510 Unspecified right bundle-branch block: Secondary | ICD-10-CM | POA: Diagnosis not present

## 2014-06-18 MED ORDER — NITROGLYCERIN 0.4 MG SL SUBL: 0.4000 mg | SUBLINGUAL_TABLET | SUBLINGUAL | Status: DC | PRN

## 2014-06-18 NOTE — Assessment & Plan Note (Signed)
Multiple PTA, last intervention 2013

## 2014-06-18 NOTE — Assessment & Plan Note (Signed)
Not on beta blocker secondary to this

## 2014-06-18 NOTE — Assessment & Plan Note (Signed)
On statin.

## 2014-06-18 NOTE — Assessment & Plan Note (Signed)
Followed by Dr Letta Pate, widespread DJD

## 2014-06-18 NOTE — Patient Instructions (Addendum)
Your physician recommends that you schedule a follow-up appointment in: 2-3 Weeks with Dr Stanford Breed  Your physician has requested that you have a lexiscan myoview. For further information please visit [email or web address]. Please follow instruction sheet, as given.

## 2014-06-18 NOTE — Assessment & Plan Note (Signed)
Controlled.  

## 2014-06-18 NOTE — Assessment & Plan Note (Signed)
Status post percutaneous intervention to the left anterior  descending in 1998, RCA DES 2011   His most recent nuclear study was performed on   April 18, 2005.  At that time, his ejection fraction was 55%.  There   was a prior inferior infarct, but no ischemia.  His most recent   echocardiogram in 2012 showed normal LV function.

## 2014-06-18 NOTE — Progress Notes (Signed)
06/18/2014 Kristopher [name]   1930-04-29  [id number]  Primary Physician [name], MD Primary Cardiologist: Dr Stanford Breed  HPI:  81 y/o followed by Dr Stanford Breed with CAD and PVD. His last PCI was in 2011. He is in the office today with complaints of DOE which is new for him. He says for the last 6 weeks or so he notes increasing SOB when walking. This is occasionally associated with diaphoresis. He denies any anginal pain but has intermittent "sharP' localized chest pain that lasts seconds. He denies orthopnea or PND.   Current Outpatient Prescriptions  Medication Sig Dispense Refill  . Ascorbic Acid (VITAMIN C) 500 MG tablet Take 500 mg by mouth 2 (two) times daily.     Marland Kitchen aspirin 325 MG EC tablet Take 325 mg by mouth daily.    Marland Kitchen atorvastatin (LIPITOR) 80 MG tablet TAKE 1 TABLET DAILY 90 tablet 0  . BEE POLLEN PO Take by mouth.    Marland Kitchen Co-Enzyme Q-10 100 MG CAPS Take 1 capsule by mouth daily.    . diclofenac sodium (VOLTAREN) 1 % GEL Apply 2 g topically 4 (four) times daily. 9 Tube 1  . fish oil-omega-3 fatty acids 1000 MG capsule Take 1 g by mouth daily.     Marland Kitchen HYDROcodone-acetaminophen (NORCO/VICODIN) 5-325 MG per tablet Take 1 tablet by mouth 3 (three) times daily before meals. 90 tablet 0  . Multiple Vitamin (MULTIVITAMIN) capsule Take 1 capsule by mouth daily.     . nitroGLYCERIN (NITROSTAT) 0.4 MG SL tablet Place 1 tablet (0.4 mg total) under the tongue every 5 (five) minutes as needed for chest pain. 25 tablet 2  . omeprazole (PRILOSEC) 20 MG capsule TAKE 2 CAPSULES DAILY 180 capsule 3  . ramipril (ALTACE) 10 MG capsule Take 1 capsule (10 mg total) by mouth daily. Need appointment before further refills 30 capsule 0   No current facility-administered medications for this visit.    Allergies  Allergen Reactions  . Influenza Vaccines Other (See Comments)    unknown  . Sulfa Antibiotics Rash    History   Social History  . Marital Status: Widowed    Spouse Name: N/A  . Number  of Children: N/A  . Years of Education: GED   Occupational History  . retired    Social History Main Topics  . Smoking status: Former Smoker -- 3.00 packs/day for 38 years    Quit date: 04/10/1988  . Smokeless tobacco: Never Used  . Alcohol Use: No  . Drug Use: No  . Sexual Activity: Not on file   Other Topics Concern  . Not on file   Social History Narrative     Review of Systems: General: negative for chills, fever, night sweats or weight changes.  Cardiovascular: negative for chest pain, dyspnea on exertion, edema, orthopnea, palpitations, paroxysmal nocturnal dyspnea or shortness of breath Dermatological: negative for rash Respiratory: negative for cough or wheezing Urologic: negative for hematuria Abdominal: negative for nausea, vomiting, diarrhea, bright red blood per rectum, melena, or hematemesis Neurologic: negative for visual changes, syncope, or dizziness All other systems reviewed and are otherwise negative except as noted above.    Blood pressure 150/72, pulse 68, height 5\' 8"  (1.727 m), weight 151 lb 11.2 oz (68.811 kg).  General appearance: alert, cooperative and no distress Neck: no JVD and soft LCA bruit Lungs: few basilar crackles Lt base Heart: regular rate and rhythm and no murmur Abdomen: not distended Extremities: no edema Skin: pale, cool, dry Neurologic:  Grossly normal  EKG NSR, RBBB  ASSESSMENT AND PLAN:   CAD S/P LAD PCI 1998, RCA DES 2011 Status post percutaneous intervention to the left anterior  descending in 1998, RCA DES 2011   His most recent nuclear study was performed on   April 18, 2005.  At that time, his ejection fraction was 55%.  There   was a prior inferior infarct, but no ischemia.  His most recent   echocardiogram in 2012 showed normal LV function.   Peripheral vascular disease Multiple PTA, last intervention 2013   Essential hypertension Controlled   Hyperlipidemia On statin   Chronic pain  syndrome Followed by Dr Letta Pate, widespread DJD   RBBB .   AV block, 2nd degree Not on beta blocker secondary to this     PLAN  I ordered a Lexiscan Myoview and CBC/BMP. He'll f/u with Dr Stanford Breed after this. He was given a fresh Rx for SL NTG.  Kristopher [name] KPA-C 06/18/2014 8:31 AM

## 2014-06-25 ENCOUNTER — Telehealth (HOSPITAL_COMMUNITY): Payer: Self-pay

## 2014-06-25 NOTE — Telephone Encounter (Signed)
Encounter complete. 

## 2014-06-26 ENCOUNTER — Telehealth (HOSPITAL_COMMUNITY): Payer: Self-pay

## 2014-06-26 NOTE — Telephone Encounter (Signed)
Encounter complete. 

## 2014-06-26 NOTE — Progress Notes (Signed)
Urine drug screen for this encounter is consistent for prescribed medication 

## 2014-06-30 ENCOUNTER — Ambulatory Visit (HOSPITAL_COMMUNITY)
Admission: RE | Admit: 2014-06-30 | Discharge: 2014-06-30 | Disposition: A | Discharge status: Home / Self Care | Payer: Medicare Other | Source: Ambulatory Visit | Attending: Cardiovascular Disease | Admitting: Cardiovascular Disease

## 2014-06-30 ENCOUNTER — Telehealth (HOSPITAL_COMMUNITY): Payer: Self-pay

## 2014-06-30 DIAGNOSIS — R0602 Shortness of breath: Secondary | ICD-10-CM

## 2014-06-30 DIAGNOSIS — I10 Essential (primary) hypertension: Secondary | ICD-10-CM | POA: Diagnosis not present

## 2014-06-30 DIAGNOSIS — R079 Chest pain, unspecified: Secondary | ICD-10-CM | POA: Diagnosis not present

## 2014-06-30 DIAGNOSIS — Z87891 Personal history of nicotine dependence: Secondary | ICD-10-CM | POA: Insufficient documentation

## 2014-06-30 DIAGNOSIS — Z9861 Coronary angioplasty status: Secondary | ICD-10-CM | POA: Diagnosis not present

## 2014-06-30 DIAGNOSIS — G894 Chronic pain syndrome: Secondary | ICD-10-CM | POA: Diagnosis not present

## 2014-06-30 DIAGNOSIS — I2510 Atherosclerotic heart disease of native coronary artery without angina pectoris: Secondary | ICD-10-CM | POA: Diagnosis not present

## 2014-06-30 DIAGNOSIS — R0609 Other forms of dyspnea: Secondary | ICD-10-CM | POA: Insufficient documentation

## 2014-06-30 DIAGNOSIS — Z8249 Family history of ischemic heart disease and other diseases of the circulatory system: Secondary | ICD-10-CM | POA: Diagnosis not present

## 2014-06-30 DIAGNOSIS — E785 Hyperlipidemia, unspecified: Secondary | ICD-10-CM | POA: Diagnosis not present

## 2014-06-30 DIAGNOSIS — R61 Generalized hyperhidrosis: Secondary | ICD-10-CM | POA: Insufficient documentation

## 2014-06-30 DIAGNOSIS — I739 Peripheral vascular disease, unspecified: Secondary | ICD-10-CM | POA: Insufficient documentation

## 2014-06-30 DIAGNOSIS — I4510 Unspecified right bundle-branch block: Secondary | ICD-10-CM | POA: Diagnosis not present

## 2014-06-30 DIAGNOSIS — R5383 Other fatigue: Secondary | ICD-10-CM | POA: Diagnosis not present

## 2014-06-30 LAB — CBC
HCT: 42.7 % (ref 39.0–52.0)
Hemoglobin: 14.1 g/dL (ref 13.0–17.0)
MCH: 30.4 pg (ref 26.0–34.0)
MCHC: 33 g/dL (ref 30.0–36.0)
MCV: 92 fL (ref 78.0–100.0)
MPV: 11.1 fL (ref 8.6–12.4)
Platelets: 215 10*3/uL (ref 150–400)
RBC: 4.64 MIL/uL (ref 4.22–5.81)
RDW: 14.8 % (ref 11.5–15.5)
WBC: 9.6 10*3/uL (ref 4.0–10.5)

## 2014-06-30 MED ORDER — TECHNETIUM TC 99M SESTAMIBI GENERIC - CARDIOLITE
30.9000 | Freq: Once | INTRAVENOUS | Status: AC | PRN
  Administered 2014-06-30: 30.9 via INTRAVENOUS

## 2014-06-30 MED ORDER — AMINOPHYLLINE 25 MG/ML IV SOLN
75.0000 mg | Freq: Once | INTRAVENOUS | Status: AC
  Administered 2014-06-30: 75 mg via INTRAVENOUS

## 2014-06-30 MED ORDER — TECHNETIUM TC 99M SESTAMIBI GENERIC - CARDIOLITE
10.6000 | Freq: Once | INTRAVENOUS | Status: AC | PRN
  Administered 2014-06-30: 11 via INTRAVENOUS

## 2014-06-30 MED ORDER — REGADENOSON 0.4 MG/5ML IV SOLN
0.4000 mg | Freq: Once | INTRAVENOUS | Status: AC
  Administered 2014-06-30: 0.4 mg via INTRAVENOUS

## 2014-06-30 NOTE — Telephone Encounter (Signed)
Encounter complete. 

## 2014-06-30 NOTE — Procedures (Addendum)
[facility] [address] [place] [address] [phone]  Cardiology Nuclear Med Study  Kristopher [name] is a 81 y.o. male     MRN : [id number]     DOB: 1931/03/10  Procedure Date: 06/30/2014  Nuclear Med Background Indication for Stress Test:  Evaluation for Ischemia and Follow up CAD History:  CAD;STENT/PTCA X4;PCI;Last NUC MPI on 07/31/2011-normal;EF=56%;RBBB; Cardiac Risk Factors: Family History - CAD, History of Smoking, Hypertension, Lipids, PVD, RBBB and 2 AVB  Symptoms:  Chest Pain, Diaphoresis, DOE and Fatigue   Nuclear Pre-Procedure Caffeine/Decaff Intake:  12:30am NPO After: 8:30am   IV Site: R Forearm  IV 0.9% NS with Angio Cath:  22g  Chest Size (in):  38" IV Started by: Rolene Course, RN  Height: 5\' 8"  (1.727 m)  Cup Size: n/a  BMI:  Body mass index is 22.96 kg/(m^2). Weight:  151 lb (68.493 kg)   Tech Comments:  n/a    Nuclear Med Study 1 or 2 day study: 1 day  Stress Test Type:  [place] Provider:  [name], MD   Resting Radionuclide: Technetium 13m Sestamibi  Resting Radionuclide Dose: 10.6 mCi   Stress Radionuclide:  Technetium 6m Sestamibi  Stress Radionuclide Dose: 30.9 mCi           Stress Protocol Rest HR: 53 Stress HR: 76  Rest BP: 194/92 Stress BP: 217/95  Exercise Time (min): n/a METS: n/a   Predicted Max HR: 139 bpm % Max HR: 71.94 bpm Rate Pressure Product: 21700  Dose of Adenosine (mg):  n/a Dose of Lexiscan: 0.4 mg  Dose of Atropine (mg): n/a Dose of Dobutamine: n/a mcg/kg/min (at max HR)  Stress Test Technologist: Leane Para, CCT Nuclear Technologist:Elizabeth Young,CNMT   Rest Procedure:  Myocardial perfusion imaging was performed at rest 45 minutes following the intravenous administration of Technetium 59m Sestamibi. Stress Procedure:  The patient received IV Lexiscan 0.4 mg over 15-seconds.  Technetium 51m Sestamibi injected IV at 30-seconds.   Patient experienced SOB, Chest Pain and headache and 75 mg Aminophylline IV was administered.  There were no significant changes with Lexiscan.  Quantitative spect images were obtained after a 45 minute delay.  Transient Ischemic Dilatation (Normal <1.22):  1.04  QGS EDV:  103 ml QGS ESV:  50 ml LV Ejection Fraction: 51%       Rest ECG: NSR-RBBB  Stress ECG: No significant change from baseline ECG  QPS Raw Data Images:  Normal; no motion artifact; normal heart/lung ratio. Stress Images:  There is decreased uptake in the inferior wall. Rest Images:  There is decreased uptake in the inferior wall. Subtraction (SDS):  There is a fixed inferior defect that is most consistent with diaphragmatic attenuation.  Impression Exercise Capacity:  Lexiscan with no exercise. BP Response:  Hypertensive blood pressure response. Clinical Symptoms:  Mild chest pain/dyspnea. ECG Impression:  No significant ST segment change suggestive of ischemia. Comparison with Prior Nuclear Study: No significant change from previous study  Overall Impression:  Low risk stress nuclear study Diaphragmatic attenuation.  LV Wall Motion:  Low nl EF   Lorretta Harp, MD  06/30/2014 5:48 PM

## 2014-07-01 ENCOUNTER — Other Ambulatory Visit: Payer: Self-pay | Admitting: *Deleted

## 2014-07-01 DIAGNOSIS — E875 Hyperkalemia: Secondary | ICD-10-CM

## 2014-07-01 LAB — BASIC METABOLIC PANEL
BUN: 24 mg/dL — ABNORMAL HIGH (ref 6–23)
CO2: 27 mEq/L (ref 19–32)
Calcium: 9.7 mg/dL (ref 8.4–10.5)
Chloride: 107 mEq/L (ref 96–112)
Creat: 0.99 mg/dL (ref 0.50–1.35)
Glucose, Bld: 85 mg/dL (ref 70–99)
Potassium: 5.1 mEq/L (ref 3.5–5.3)
Sodium: 144 mEq/L (ref 135–145)

## 2014-07-02 ENCOUNTER — Ambulatory Visit (HOSPITAL_BASED_OUTPATIENT_CLINIC_OR_DEPARTMENT_OTHER): Payer: Medicare Other | Admitting: Physical Medicine & Rehabilitation

## 2014-07-02 ENCOUNTER — Encounter: Payer: Self-pay | Admitting: Physical Medicine & Rehabilitation

## 2014-07-02 ENCOUNTER — Encounter: Payer: Medicare Other | Attending: Registered Nurse

## 2014-07-02 VITALS — BP 158/75 | HR 65 | Resp 14

## 2014-07-02 DIAGNOSIS — M545 Low back pain, unspecified: Secondary | ICD-10-CM | POA: Insufficient documentation

## 2014-07-02 DIAGNOSIS — M1611 Unilateral primary osteoarthritis, right hip: Secondary | ICD-10-CM | POA: Insufficient documentation

## 2014-07-02 DIAGNOSIS — Z9861 Coronary angioplasty status: Secondary | ICD-10-CM

## 2014-07-02 DIAGNOSIS — G894 Chronic pain syndrome: Secondary | ICD-10-CM

## 2014-07-02 DIAGNOSIS — I2510 Atherosclerotic heart disease of native coronary artery without angina pectoris: Secondary | ICD-10-CM | POA: Diagnosis not present

## 2014-07-02 DIAGNOSIS — M222X1 Patellofemoral disorders, right knee: Secondary | ICD-10-CM | POA: Diagnosis not present

## 2014-07-02 DIAGNOSIS — Z79899 Other long term (current) drug therapy: Secondary | ICD-10-CM | POA: Diagnosis not present

## 2014-07-02 DIAGNOSIS — G8929 Other chronic pain: Secondary | ICD-10-CM

## 2014-07-02 DIAGNOSIS — M25561 Pain in right knee: Secondary | ICD-10-CM

## 2014-07-02 DIAGNOSIS — M1711 Unilateral primary osteoarthritis, right knee: Secondary | ICD-10-CM | POA: Insufficient documentation

## 2014-07-02 DIAGNOSIS — Z5181 Encounter for therapeutic drug level monitoring: Secondary | ICD-10-CM | POA: Diagnosis not present

## 2014-07-02 MED ORDER — HYDROCODONE-ACETAMINOPHEN 5-325 MG PO TABS: 1.0000 | ORAL_TABLET | Freq: Three times a day (TID) | ORAL | Status: DC

## 2014-07-02 NOTE — Patient Instructions (Signed)
Let me know if you get more numbness or weakness in Right thigh

## 2014-07-02 NOTE — Progress Notes (Signed)
Subjective:    Patient ID: Kristopher [name], male    DOB: Aug 24, 1930, 81 y.o.   MRN: [id number] Last visit 05/07/2014 Right knee injection helpful Has pain with Light touch in Right anterior thigh and anterior leg On hydrocodone TID-  No hip pain since injection 01/19/2014, intra artic US guided HPI  Still has right thigh pain and occasional numbness and tingling. No pain with weightbearing. Goes down steps with the right leg first. No knee buckling reported Pain Inventory Average Pain 4 Pain Right Now 4 My pain is intermittent and sharp  In the last 24 hours, has pain interfered with the following? General activity 5 Relation with others 5 Enjoyment of life 5 What TIME of day is your pain at its worst? varies Sleep (in general) Good  Pain is worse with: walking, standing and some activites Pain improves with: rest and medication Relief from Meds: 4  Mobility use a cane how many minutes can you walk? 5-10 ability to climb steps?  yes do you drive?  yes  Function retired  Neuro/Psych trouble walking  Prior Studies Any changes since last visit?  no  Physicians involved in your care Any changes since last visit?  no   Family History  Problem Relation Age of Onset  . Heart disease Mother   . Heart disease Father    History   Social History  . Marital Status: Widowed    Spouse Name: N/A  . Number of Children: N/A  . Years of Education: GED   Occupational History  . retired    Social History Main Topics  . Smoking status: Former Smoker -- 3.00 packs/day for 38 years    Quit date: 04/10/1988  . Smokeless tobacco: Never Used  . Alcohol Use: No  . Drug Use: No  . Sexual Activity: Not on file   Other Topics Concern  . None   Social History Narrative   Past Surgical History  Procedure Laterality Date  . Pci of lad  05/1996    STENT X 4 TOTAL  . Hemorrhoid surgery  1954  . Cataract extraction w/ intraocular lens  implant, bilateral  YRS AGO  .  Vasectomy  1968  . Transurethral resection of prostate  08/11/2011    Procedure: TRANSURETHRAL RESECTION OF THE PROSTATE WITH GYRUS INSTRUMENTS;  Surgeon: Fredricka Bonine, MD;  Location: [facility];  Service: Urology;  Laterality: N/A;     . Coronary angioplasty with stent placement    . Lower extremity angiogram Bilateral 09/20/2011    Procedure: LOWER EXTREMITY ANGIOGRAM;  Surgeon: Wellington Hampshire, MD;  Location: [facility];  Service: Cardiovascular;  Laterality: Bilateral;   Past Medical History  Diagnosis Date  . HTN (hypertension)   . CAD (coronary artery disease)     s/p PCI of LAD 1998;  s/p DES to RCA 05/2009;  Myoview 4/13: low risk, no ischemia, EF 56%  . Aortic stenosis     echo 4/12: normal LVF, mild LAE, no significant AS  . HLD (hyperlipidemia)   . PAD (peripheral artery disease)     s/p L CIA stent 09/2011 ([facility])  . Renal artery stenosis     s/p bilat RA stents  . Esophageal reflux   . DJD (degenerative joint disease), lumbar 07/06/2011  . Urine incontinence   . Acute myocardial infarction, unspecified site, episode of care unspecified 1998  . COPD (chronic obstructive pulmonary disease)   . AAA (abdominal aortic aneurysm)     ultrasound 07/2011:  2.7 x 2.8 cm   BP 158/75 mmHg  Pulse 65  Resp 14  SpO2 100%  Opioid Risk Score:   Fall Risk Score: Moderate Fall Risk (6-13 points) (previously educated and given handout)`1  Depression screen PHQ 2/9  Depression screen [facility] 2/9 07/02/2014 01/15/2014 07/16/2013 01/16/2013  Decreased Interest 0 0 0 0  Down, Depressed, Hopeless 0 0 0 1  PHQ - 2 Score 0 0 0 1  Altered sleeping 0 - - -  Tired, decreased energy 1 - - -  Change in appetite 1 - - -  Feeling bad or failure about yourself  0 - - -  Trouble concentrating 0 - - -  Moving slowly or fidgety/restless 0 - - -  Suicidal thoughts 0 - - -  PHQ-9 Score 2 - - -    Review of Systems  Musculoskeletal: Positive for gait problem.  All other systems reviewed and are  negative.      Objective:   Physical Exam  Constitutional: He is oriented to person, place, and time. He appears well-developed and well-nourished.  HENT:  Head: Normocephalic and atraumatic.  Eyes: Conjunctivae and EOM are normal. Pupils are equal, round, and reactive to light.  Neurological: He is alert and oriented to person, place, and time. He has normal strength. A sensory deficit is present.  Negative straight leg raising Positive femoral stretch test on the right side with some burning.  Motor strength is 5/5 bilateral hip flexors knee extensors ankle dorsiflexor and plantar flexor Paresthesia right L3  Psychiatric: He has a normal mood and affect.  Nursing note and vitals reviewed.         Assessment & Plan:  1. Chronic pain syndrome multifactorial has osteoarthritis as well as probable radiculopathy. Lumbar pain with probable L3 radiculitis, no motor symptoms We'll monitor symptomatology. If progressive will order MRI  2. Right knee osteoarthritis improved after injection with corticosteroid January 2016  3. Right hip osteoarthritis improved after injection with corticosteroid under ultrasound guidance October 2016

## 2014-07-02 NOTE — Progress Notes (Deleted)
   Subjective:    Patient ID: Kristopher [name], male    DOB: Jun 01, 1930, 81 y.o.   MRN: [id number]  HPI    Review of Systems     Objective:   Physical Exam        Assessment & Plan:

## 2014-07-13 NOTE — Progress Notes (Signed)
HPI: FU CAD, s/p PCI of his LAD performed in February 1998. He also has PVD and has had prior bilateral renal artery stenting. Cardiac catheterization in February 2011: EF 60% and no renal artery stenosis, RCA 90% but no obstructive disease in the left system. He had PCI of his right coronary artery with a drug-eluting stent at that time. Echocardiogram in April of 2012 revealed normal LV function, mild LAE, no significant AS. LE angiogram 09/20/11: Small infrarenal aortic aneurysm, no renal artery stenosis, ostial right CIA 60%, proximal left CIA severely calcified with 80-90%, left EIA occluded and filled by collats. He underwent intervention with angioplasty and stenting to the left CIA. 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 Followup recommended one year. H/O asymptomatic [place] 1. Nuclear study March 2016 with ejection fraction 51%, diaphragmatic attenuation, no ischemia. Since last seen, he has some dyspnea on exertion but no orthopnea, PND or pedal edema. He occasionally feels chest pain for 1-2 seconds. No syncope. Patient is concerned that recent stress test was unrevealing because he did not feel any sensation with the Lexiscan injection.  Current Outpatient Prescriptions  Medication Sig Dispense Refill  . Ascorbic Acid (VITAMIN C) 500 MG tablet Take 500 mg by mouth 2 (two) times daily.     Marland Kitchen aspirin 325 MG EC tablet Take 325 mg by mouth daily.    Marland Kitchen atorvastatin (LIPITOR) 80 MG tablet TAKE 1 TABLET DAILY 90 tablet 0  . BEE POLLEN PO Take by mouth.    Marland Kitchen Co-Enzyme Q-10 100 MG CAPS Take 1 capsule by mouth daily.    . diclofenac sodium (VOLTAREN) 1 % GEL Apply 2 g topically 4 (four) times daily. 9 Tube 1  . fish oil-omega-3 fatty acids 1000 MG capsule Take 1 g by mouth daily.     Marland Kitchen HYDROcodone-acetaminophen (NORCO/VICODIN) 5-325 MG per tablet Take 1 tablet by mouth 3 (three) times daily  before meals. 90 tablet 0  . Multiple Vitamin (MULTIVITAMIN) capsule Take 1 capsule by mouth daily.     . Multiple Vitamins-Minerals (PRESERVISION AREDS PO) Take 2 tablets by mouth daily.    . nitroGLYCERIN (NITROSTAT) 0.4 MG SL tablet Place 1 tablet (0.4 mg total) under the tongue every 5 (five) minutes as needed for chest pain. 25 tablet 2  . omeprazole (PRILOSEC) 20 MG capsule TAKE 2 CAPSULES DAILY 180 capsule 3  . ramipril (ALTACE) 10 MG capsule Take 1 capsule (10 mg total) by mouth daily. Need appointment before further refills 30 capsule 0   No current facility-administered medications for this visit.     Past Medical History  Diagnosis Date  . HTN (hypertension)   . CAD (coronary artery disease)     s/p PCI of LAD 1998;  s/p DES to RCA 05/2009;  Myoview 4/13: low risk, no ischemia, EF 56%  . Aortic stenosis     echo 4/12: normal LVF, mild LAE, no significant AS  . HLD (hyperlipidemia)   . PAD (peripheral artery disease)     s/p L CIA stent 09/2011 ([facility])  . Renal artery stenosis     s/p bilat RA stents  . Esophageal reflux   . DJD (degenerative joint disease), lumbar 07/06/2011  . Urine incontinence   . Acute myocardial infarction, unspecified site, episode of care unspecified 1998  . COPD (chronic obstructive pulmonary disease)   . AAA (abdominal aortic aneurysm)  ultrasound 07/2011:  2.7 x 2.8 cm    Past Surgical History  Procedure Laterality Date  . Pci of lad  05/1996    STENT X 4 TOTAL  . Hemorrhoid surgery  1954  . Cataract extraction w/ intraocular lens  implant, bilateral  YRS AGO  . Vasectomy  1968  . Transurethral resection of prostate  08/11/2011    Procedure: TRANSURETHRAL RESECTION OF THE PROSTATE WITH GYRUS INSTRUMENTS;  Surgeon: Fredricka Bonine, MD;  Location: [facility];  Service: Urology;  Laterality: N/A;     . Coronary angioplasty with stent placement    . Lower extremity angiogram Bilateral 09/20/2011    Procedure: LOWER EXTREMITY ANGIOGRAM;   Surgeon: Wellington Hampshire, MD;  Location: [facility];  Service: Cardiovascular;  Laterality: Bilateral;    History   Social History  . Marital Status: Widowed    Spouse Name: N/A  . Number of Children: N/A  . Years of Education: GED   Occupational History  . retired    Social History Main Topics  . Smoking status: Former Smoker -- 3.00 packs/day for 38 years    Quit date: 04/10/1988  . Smokeless tobacco: Never Used  . Alcohol Use: No  . Drug Use: No  . Sexual Activity: Not on file   Other Topics Concern  . Not on file   Social History Narrative    ROS: arthralgias but no fevers or chills, productive cough, hemoptysis, dysphasia, odynophagia, melena, hematochezia, dysuria, hematuria, rash, seizure activity, orthopnea, PND, pedal edema, claudication. Remaining systems are negative.  Physical Exam: Well-developed well-nourished in no acute distress.  Skin is warm and dry.  HEENT is normal.  Neck is supple.  Chest is clear to auscultation with normal expansion.  Cardiovascular exam is regular rate and rhythm.  Abdominal exam nontender or distended. No masses palpated. Extremities show no edema. neuro grossly intact

## 2014-07-14 ENCOUNTER — Encounter: Payer: Self-pay | Admitting: Cardiology

## 2014-07-14 ENCOUNTER — Ambulatory Visit (INDEPENDENT_AMBULATORY_CARE_PROVIDER_SITE_OTHER): Payer: Medicare Other | Admitting: Cardiology

## 2014-07-14 VITALS — BP 136/70 | HR 64 | Ht 69.0 in | Wt 153.9 lb

## 2014-07-14 DIAGNOSIS — E785 Hyperlipidemia, unspecified: Secondary | ICD-10-CM

## 2014-07-14 DIAGNOSIS — I714 Abdominal aortic aneurysm, without rupture, unspecified: Secondary | ICD-10-CM

## 2014-07-14 DIAGNOSIS — Z9861 Coronary angioplasty status: Secondary | ICD-10-CM

## 2014-07-14 DIAGNOSIS — I701 Atherosclerosis of renal artery: Secondary | ICD-10-CM | POA: Diagnosis not present

## 2014-07-14 DIAGNOSIS — I10 Essential (primary) hypertension: Secondary | ICD-10-CM

## 2014-07-14 DIAGNOSIS — I2510 Atherosclerotic heart disease of native coronary artery without angina pectoris: Secondary | ICD-10-CM

## 2014-07-14 DIAGNOSIS — I739 Peripheral vascular disease, unspecified: Secondary | ICD-10-CM

## 2014-07-14 MED ORDER — RAMIPRIL 10 MG PO CAPS: 10.0000 mg | ORAL_CAPSULE | Freq: Every day | ORAL | Status: DC

## 2014-07-14 MED ORDER — NITROGLYCERIN 0.4 MG SL SUBL: 0.4000 mg | SUBLINGUAL_TABLET | SUBLINGUAL | Status: DC | PRN

## 2014-07-14 MED ORDER — ATORVASTATIN CALCIUM 80 MG PO TABS: 80.0000 mg | ORAL_TABLET | Freq: Every day | ORAL | Status: DC

## 2014-07-14 NOTE — Assessment & Plan Note (Signed)
Continue aspirin and statin. 

## 2014-07-14 NOTE — Assessment & Plan Note (Signed)
Noted during previous admission. No dizziness or syncope.

## 2014-07-14 NOTE — Assessment & Plan Note (Signed)
Plan follow-up abdominal ultrasound June 2016.

## 2014-07-14 NOTE — Assessment & Plan Note (Signed)
Continue statin. 

## 2014-07-14 NOTE — Assessment & Plan Note (Signed)
Blood pressure controlled. Continue present medications. 

## 2014-07-14 NOTE — Patient Instructions (Signed)
Your physician recommends that you schedule a follow-up appointment in: [place] wants you to follow-up in: [place] will receive a reminder letter in the mail two months in advance. If you don't receive a letter, please call our office to schedule the follow-up appointment.    Your physician has requested that you have an abdominal aorta duplex. During this test, an ultrasound is used to evaluate the aorta. Allow 30 minutes for this exam. Do not eat after midnight the day before and avoid carbonated beverages SCHEDULE IN June

## 2014-07-14 NOTE — Assessment & Plan Note (Signed)
Continue aspirin and statin. Patient is concerned that his recent stress test was unrevealing because he did not feel any symptoms with Lexiscan injection. We will continue to follow him for recurrent symptoms.

## 2014-08-04 ENCOUNTER — Encounter: Payer: Medicare Other | Attending: Registered Nurse

## 2014-08-04 ENCOUNTER — Encounter: Payer: Self-pay | Admitting: Physical Medicine & Rehabilitation

## 2014-08-04 ENCOUNTER — Ambulatory Visit (HOSPITAL_BASED_OUTPATIENT_CLINIC_OR_DEPARTMENT_OTHER): Payer: Medicare Other | Admitting: Physical Medicine & Rehabilitation

## 2014-08-04 VITALS — BP 150/84 | HR 68 | Resp 14

## 2014-08-04 DIAGNOSIS — Z79899 Other long term (current) drug therapy: Secondary | ICD-10-CM | POA: Diagnosis not present

## 2014-08-04 DIAGNOSIS — M222X1 Patellofemoral disorders, right knee: Secondary | ICD-10-CM | POA: Diagnosis not present

## 2014-08-04 DIAGNOSIS — E875 Hyperkalemia: Secondary | ICD-10-CM | POA: Diagnosis not present

## 2014-08-04 DIAGNOSIS — I701 Atherosclerosis of renal artery: Secondary | ICD-10-CM

## 2014-08-04 DIAGNOSIS — Z5181 Encounter for therapeutic drug level monitoring: Secondary | ICD-10-CM | POA: Insufficient documentation

## 2014-08-04 DIAGNOSIS — M1611 Unilateral primary osteoarthritis, right hip: Secondary | ICD-10-CM | POA: Diagnosis not present

## 2014-08-04 DIAGNOSIS — G894 Chronic pain syndrome: Secondary | ICD-10-CM | POA: Diagnosis not present

## 2014-08-04 DIAGNOSIS — M5417 Radiculopathy, lumbosacral region: Secondary | ICD-10-CM

## 2014-08-04 DIAGNOSIS — M1711 Unilateral primary osteoarthritis, right knee: Secondary | ICD-10-CM | POA: Diagnosis not present

## 2014-08-04 DIAGNOSIS — M25561 Pain in right knee: Secondary | ICD-10-CM

## 2014-08-04 DIAGNOSIS — M5416 Radiculopathy, lumbar region: Secondary | ICD-10-CM | POA: Insufficient documentation

## 2014-08-04 MED ORDER — HYDROCODONE-ACETAMINOPHEN 5-325 MG PO TABS: 1.0000 | ORAL_TABLET | Freq: Three times a day (TID) | ORAL | Status: DC

## 2014-08-04 NOTE — Progress Notes (Signed)
Subjective:    Patient ID: Kristopher [name], male    DOB: 12/24/1930, 81 y.o.   MRN: [id number]  HPI Occ takes 2 tablets of hydrocodone per day, Normal day is 3 tablets of hydrocodone Patient is mowing his lawn, uses a self propelled push mower but has a lot of hills Has some numbness and tingling in the right thigh and into the right medial leg Denies any weakness in his right leg Right hip pain is doing okay Right knee pain is doing okay as well No significant side effects from the medication Pain Inventory Average Pain 5 Pain Right Now 3 My pain is intermittent, tingling and aching  In the last 24 hours, has pain interfered with the following? General activity 5 Relation with others 5 Enjoyment of life 5 What TIME of day is your pain at its worst? varies Sleep (in general) Poor  Pain is worse with: walking, bending, sitting, standing and some activites Pain improves with: rest and medication Relief from Meds: 8  Mobility walk without assistance use a cane how many minutes can you walk? 10-15 ability to climb steps?  yes do you drive?  yes  Function retired  Neuro/Psych weakness tingling  Prior Studies Any changes since last visit?  no  Physicians involved in your care Any changes since last visit?  no   Family History  Problem Relation Age of Onset  . Heart disease Mother   . Heart disease Father    History   Social History  . Marital Status: Widowed    Spouse Name: N/A  . Number of Children: N/A  . Years of Education: GED   Occupational History  . retired    Social History Main Topics  . Smoking status: Former Smoker -- 3.00 packs/day for 38 years    Quit date: 04/10/1988  . Smokeless tobacco: Never Used  . Alcohol Use: No  . Drug Use: No  . Sexual Activity: Not on file   Other Topics Concern  . None   Social History Narrative   Past Surgical History  Procedure Laterality Date  . Pci of lad  05/1996    STENT X 4 TOTAL  . Hemorrhoid  surgery  1954  . Cataract extraction w/ intraocular lens  implant, bilateral  YRS AGO  . Vasectomy  1968  . Transurethral resection of prostate  08/11/2011    Procedure: TRANSURETHRAL RESECTION OF THE PROSTATE WITH GYRUS INSTRUMENTS;  Surgeon: Fredricka Bonine, MD;  Location: [facility];  Service: Urology;  Laterality: N/A;     . Coronary angioplasty with stent placement    . Lower extremity angiogram Bilateral 09/20/2011    Procedure: LOWER EXTREMITY ANGIOGRAM;  Surgeon: Wellington Hampshire, MD;  Location: [place] [facility];  Service: Cardiovascular;  Laterality: Bilateral;   Past Medical History  Diagnosis Date  . HTN (hypertension)   . CAD (coronary artery disease)     s/p PCI of LAD 1998;  s/p DES to RCA 05/2009;  Myoview 4/13: low risk, no ischemia, EF 56%  . Aortic stenosis     echo 4/12: normal LVF, mild LAE, no significant AS  . HLD (hyperlipidemia)   . PAD (peripheral artery disease)     s/p L CIA stent 09/2011 ([facility])  . Renal artery stenosis     s/p bilat RA stents  . Esophageal reflux   . DJD (degenerative joint disease), lumbar 07/06/2011  . Urine incontinence   . Acute myocardial infarction, unspecified site, episode of care  unspecified 1998  . COPD (chronic obstructive pulmonary disease)   . AAA (abdominal aortic aneurysm)     ultrasound 07/2011:  2.7 x 2.8 cm   BP 150/84 mmHg  Pulse 68  Resp 14  SpO2 96%  Opioid Risk Score:   Fall Risk Score:  `1  Depression screen PHQ 2/9  Depression screen [facility] 2/9 07/02/2014 01/15/2014 07/16/2013 01/16/2013  Decreased Interest 0 0 0 0  Down, Depressed, Hopeless 0 0 0 1  PHQ - 2 Score 0 0 0 1  Altered sleeping 0 - - -  Tired, decreased energy 1 - - -  Change in appetite 1 - - -  Feeling bad or failure about yourself  0 - - -  Trouble concentrating 0 - - -  Moving slowly or fidgety/restless 0 - - -  Suicidal thoughts 0 - - -  PHQ-9 Score 2 - - -     Review of Systems  HENT: Negative.   Eyes: Negative.   Respiratory: Negative.     Cardiovascular: Negative.   Gastrointestinal: Negative.   Endocrine: Negative.   Genitourinary: Negative.   Musculoskeletal: Positive for arthralgias.       Right hip pain, left leg pain tingling  Skin: Negative.   Allergic/Immunologic: Negative.   Neurological: Positive for weakness.  Hematological: Negative.   Psychiatric/Behavioral: Negative.        Objective:   Physical Exam  Constitutional: He is oriented to person, place, and time. He appears well-developed and well-nourished.  HENT:  Head: Normocephalic and atraumatic.  Eyes: Conjunctivae are normal. Pupils are equal, round, and reactive to light.  Musculoskeletal:       Right hip: He exhibits decreased range of motion. He exhibits no tenderness and no deformity.       Right knee: He exhibits normal range of motion and no deformity. No tenderness found.  Neurological: He is alert and oriented to person, place, and time.  Psychiatric: He has a normal mood and affect.  Nursing note and vitals reviewed.   Onychomycosis right toenails Good right pedal pulse Motor strength is 5/5 bilateral hip flexor and extensor ankle dorsal flexor and plantar flexor Sensation mildly reduced in the right L3 and L4 nerve root distribution     Assessment & Plan:  1. Chronic pain syndrome multifactorial overall functioning at an independent level including doing yard work. Continue hydrocodone 2-3 tablets per day 5 mg  2. Right hip osteoarthritis had ultrasound-guided hip injection ~ 6 months ago still doing well with this  3. Right knee arthritis, Has had right knee Corticosteroid  injection with good results several months ago may repeat if pain increases 4. Lumbar spinal stenosis with chronic right L4 radiculitis, only involving sensory fibers at this point Patient feels like it is not limiting him in any way at the current time. We discussed that if he develops weakness in the right lower extremity we would need to get a lumbar  MRI Return to clinic in one month

## 2014-08-04 NOTE — Patient Instructions (Signed)
If you develop weakness in right leg please call and we will order MRI of Lumbar spine

## 2014-08-05 LAB — BASIC METABOLIC PANEL WITH GFR
BUN: 23 mg/dL (ref 6–23)
CO2: 26 mEq/L (ref 19–32)
Calcium: 9.1 mg/dL (ref 8.4–10.5)
Chloride: 109 mEq/L (ref 96–112)
Creat: 0.99 mg/dL (ref 0.50–1.35)
GFR, Est African American: 82 mL/min
GFR, Est Non African American: 71 mL/min
Glucose, Bld: 113 mg/dL — ABNORMAL HIGH (ref 70–99)
Potassium: 3.9 mEq/L (ref 3.5–5.3)
Sodium: 145 mEq/L (ref 135–145)

## 2014-08-28 ENCOUNTER — Telehealth: Payer: Self-pay | Admitting: Internal Medicine

## 2014-08-28 NOTE — Telephone Encounter (Signed)
Patient scheduled.

## 2014-08-28 NOTE — Telephone Encounter (Signed)
Please advise. I have also asked that pt be scheduled at Saturday clinic

## 2014-08-28 NOTE — Telephone Encounter (Signed)
i agree, please encourage to be seen at [facility]

## 2014-08-28 NOTE — Telephone Encounter (Signed)
[place]    --------------------------------------------------------------------------------   Patient Name: Kristopher [name]  Gender: Male  DOB: Jan 01, 1931   Age: 81 Y 87 M 4 D  Return Phone Number: [phone] (Primary), [phone] (Secondary)  Address:     City/State/Zip:       [employer] [facility] - Client  Client Site [place] - Day  Physician John, [place] Type Call  Call Type Triage / [place] Name Vern Claude  Relationship To Patient Friend  Appointment Disposition EMR Appointment Attempted - Not Scheduled  Info pasted into Epic Yes  Return Phone Number [phone] (Primary)  Chief Complaint Tick Bite  Initial Comment Caller states her boyfriend removed a gray deer tick from buttocks, had some redness, wants to know if medication needed  [place] will be seen on Saturday number given to make an appointment  PreDisposition Call Doctor       Nurse Assessment  Nurse: Buck Mam, RN, Trish Date/Time Eilene Ghazi Time): 08/28/2014 1:02:24 PM  Confirm and document reason for call. If symptomatic, describe symptoms. ---Patient is calling for self and caller states her boyfriend removed a gray deer tick from buttocks last night, had some redness, wants to know if medication needed. Not sure how long it has been there. Dime size red circle on the are. He is sure he was able to get the head out.    Has the patient traveled out of the country within the last 30 days? ---No    Does the patient require triage? ---Yes    Related visit to physician within the last 2 weeks? ---No    Does the PT have any chronic conditions? (i.e. diabetes, asthma, etc.) ---Yes    List chronic conditions. ---stints and heart attacks high blood pressure.           Guidelines          Guideline Title Affirmed Question Affirmed Notes Nurse  Date/Time (Eastern Time)  Tick Bite [1] 2 to 14 days following tick bite AND [2] widespread rash or headache AND [3] no fever    Buck Mam, RN, Trish 08/28/2014 1:06:18 PM    Disp. Time Eilene Ghazi Time) Disposition Final User         08/28/2014 1:07:42 PM See Physician within 24 Hours Yes Buck Mam, RN, [place] Sender            Caller Understands: Yes  Disagree/Comply: Comply       Care Advice Given Per Guideline        SEE PHYSICIAN WITHIN 24 HOURS: * You become worse. CALL BACK IF: * Fever occurs CARE ADVICE given per Tick Bites (Adult) guideline.    After Care Instructions Given        Call Event Type User Date / Time Description        --------------------------------------------------------------------------------            Referrals  REFERRED TO PCP OFFICE  [facility]  [facility]                No appointments today patient is going to call and get an appointment at the Saturday clinic

## 2014-08-29 ENCOUNTER — Encounter: Payer: Self-pay | Admitting: Family Medicine

## 2014-08-29 ENCOUNTER — Ambulatory Visit (INDEPENDENT_AMBULATORY_CARE_PROVIDER_SITE_OTHER): Payer: Medicare Other | Admitting: Family Medicine

## 2014-08-29 VITALS — BP 120/70 | HR 66 | Temp 97.0°F | Ht 69.0 in | Wt 154.5 lb

## 2014-08-29 DIAGNOSIS — T148 Other injury of unspecified body region: Secondary | ICD-10-CM

## 2014-08-29 DIAGNOSIS — L237 Allergic contact dermatitis due to plants, except food: Secondary | ICD-10-CM | POA: Diagnosis not present

## 2014-08-29 DIAGNOSIS — W57XXXA Bitten or stung by nonvenomous insect and other nonvenomous arthropods, initial encounter: Secondary | ICD-10-CM | POA: Insufficient documentation

## 2014-08-29 DIAGNOSIS — I701 Atherosclerosis of renal artery: Secondary | ICD-10-CM

## 2014-08-29 NOTE — Progress Notes (Signed)
Pre visit review using our clinic review tool, if applicable. No additional management support is needed unless otherwise documented below in the visit note. 

## 2014-08-29 NOTE — Progress Notes (Signed)
Subjective:    Patient ID: Kristopher [name], male    DOB: 1931-01-17, 81 y.o.   MRN: [id number]  HPI Found a tick on him early Friday am early- it was on his hip  Relatively large - ? Dog tick  Was cleaning a corder of his yard in the weeds  The area burns a bit  Did not put anything on it  Washing with soap and water No fever Feels ok -no new symptoms   Has a little rash on L forearm (? Poison ivy)   Patient Active Problem List   Diagnosis Date Noted  . Right lumbar radiculitis 08/04/2014  . Chronic low back pain 07/02/2014  . RBBB 06/18/2014  . Patellofemoral arthralgia of right knee 03/24/2014  . Osteoarthritis of right hip 03/24/2014  . Dupuytren's contracture of both hands 03/24/2014  . Ankle impingement syndrome 11/06/2013  . Liver mass 10/21/2013  . Low back pain 08/31/2013  . AV block, 2nd degree 08/31/2013  . Chronic pain syndrome 04/17/2013  . Right hand pain 07/11/2012  . Peripheral artery disease 10/04/2011  . COPD (chronic obstructive pulmonary disease) 07/06/2011  . DJD (degenerative joint disease), lumbar 07/06/2011  . Preventative health care 07/06/2011  . Abdominal pain 05/25/2011  . Pancreatitis 05/24/2011  . Abdominal aortic aneurysm 06/13/2010  . Hyperlipidemia 05/18/2009  . Essential hypertension 05/18/2009  . MI 05/18/2009  . CAD S/P LAD PCI 1998, RCA DES 2011 05/18/2009  . AORTIC STENOSIS 05/18/2009  . RENAL ARTERY STENOSIS 05/18/2009  . Peripheral vascular disease 05/18/2009  . GERD 05/18/2009  . DYSPNEA 05/18/2009   Past Medical History  Diagnosis Date  . HTN (hypertension)   . CAD (coronary artery disease)     s/p PCI of LAD 1998;  s/p DES to RCA 05/2009;  Myoview 4/13: low risk, no ischemia, EF 56%  . Aortic stenosis     echo 4/12: normal LVF, mild LAE, no significant AS  . HLD (hyperlipidemia)   . PAD (peripheral artery disease)     s/p L CIA stent 09/2011 ([facility])  . Renal artery stenosis     s/p bilat RA stents  . Esophageal reflux    . DJD (degenerative joint disease), lumbar 07/06/2011  . Urine incontinence   . Acute myocardial infarction, unspecified site, episode of care unspecified 1998  . COPD (chronic obstructive pulmonary disease)   . AAA (abdominal aortic aneurysm)     ultrasound 07/2011:  2.7 x 2.8 cm   Past Surgical History  Procedure Laterality Date  . Pci of lad  05/1996    STENT X 4 TOTAL  . Hemorrhoid surgery  1954  . Cataract extraction w/ intraocular lens  implant, bilateral  YRS AGO  . Vasectomy  1968  . Transurethral resection of prostate  08/11/2011    Procedure: TRANSURETHRAL RESECTION OF THE PROSTATE WITH GYRUS INSTRUMENTS;  Surgeon: Fredricka Bonine, MD;  Location: [facility];  Service: Urology;  Laterality: N/A;     . Coronary angioplasty with stent placement    . Lower extremity angiogram Bilateral 09/20/2011    Procedure: LOWER EXTREMITY ANGIOGRAM;  Surgeon: Wellington Hampshire, MD;  Location: [facility];  Service: Cardiovascular;  Laterality: Bilateral;   History  Substance Use Topics  . Smoking status: Former Smoker -- 3.00 packs/day for 38 years    Quit date: 04/10/1988  . Smokeless tobacco: Never Used  . Alcohol Use: No   Family History  Problem Relation Age of Onset  . Heart disease Mother   .  Heart disease Father    Allergies  Allergen Reactions  . Influenza Vaccines Other (See Comments)    unknown  . Sulfa Antibiotics Rash   Current Outpatient Prescriptions on File Prior to Visit  Medication Sig Dispense Refill  . Ascorbic Acid (VITAMIN C) 500 MG tablet Take 500 mg by mouth 2 (two) times daily.     Marland Kitchen aspirin 325 MG EC tablet Take 325 mg by mouth daily.    Marland Kitchen atorvastatin (LIPITOR) 80 MG tablet Take 1 tablet (80 mg total) by mouth daily. 90 tablet 3  . BEE POLLEN PO Take by mouth.    Marland Kitchen Co-Enzyme Q-10 100 MG CAPS Take 1 capsule by mouth daily.    . diclofenac sodium (VOLTAREN) 1 % GEL Apply 2 g topically 4 (four) times daily. 9 Tube 1  . fish oil-omega-3 fatty acids 1000  MG capsule Take 1 g by mouth daily.     Marland Kitchen HYDROcodone-acetaminophen (NORCO/VICODIN) 5-325 MG per tablet Take 1 tablet by mouth 3 (three) times daily before meals. 90 tablet 0  . Multiple Vitamin (MULTIVITAMIN) capsule Take 1 capsule by mouth daily.     . Multiple Vitamins-Minerals (PRESERVISION AREDS PO) Take 2 tablets by mouth daily.    . nitroGLYCERIN (NITROSTAT) 0.4 MG SL tablet Place 1 tablet (0.4 mg total) under the tongue every 5 (five) minutes as needed for chest pain. 90 tablet 3  . omeprazole (PRILOSEC) 20 MG capsule TAKE 2 CAPSULES DAILY 180 capsule 3  . ramipril (ALTACE) 10 MG capsule Take 1 capsule (10 mg total) by mouth daily. 90 capsule 3   No current facility-administered medications on file prior to visit.        Review of Systems Review of Systems  Constitutional: Negative for fever, appetite change, fatigue and unexpected weight change.  Eyes: Negative for pain and visual disturbance.  Respiratory: Negative for cough and shortness of breath.   Cardiovascular: Negative for cp or palpitations    Gastrointestinal: Negative for nausea, diarrhea and constipation.  Genitourinary: Negative for urgency and frequency.  Skin: Negative for pallor and pos for insect bite and poison ivy rash  Neurological: Negative for weakness, light-headedness, numbness and headaches.  Hematological: Negative for adenopathy. Does not bruise/bleed easily.  Psychiatric/Behavioral: Negative for dysphoric mood. The patient is not nervous/anxious.         Objective:   Physical Exam  Constitutional: He appears well-developed and well-nourished. No distress.  HENT:  Head: Normocephalic and atraumatic.  Mouth/Throat: Oropharynx is clear and moist.  Eyes: Conjunctivae and EOM are normal. Pupils are equal, round, and reactive to light. Right eye exhibits no discharge. Left eye exhibits no discharge.  Neck: Normal range of motion. Neck supple.  Cardiovascular: Normal rate and regular rhythm.     Musculoskeletal: He exhibits no edema.  No acute joint changes   Lymphadenopathy:    He has no cervical adenopathy.  Neurological: He is alert.  Skin: Skin is warm and dry.  Tick bite L hip: 1 cm of erythema and induration w/o scale or central clearing or bullseye pattern  L forearm: healing vesicular rash of poison ivy with linear dist and no signs of bacterial infection    No other rash  Psychiatric: He has a normal mood and affect.          Assessment & Plan:   Problem List Items Addressed This Visit    Poison ivy dermatitis    L forearm- already drying up  Disc topial care- abx oint/soap and  water and otc cortisone cream Disc avoidance  Update if not starting to improve in a week or if worsening        Tick bite - Primary    L hip - 1 cm of induration and erythema - no bullseye shape or rash  No s/s of tick fever inst re: care and update if worse or not improved or new symptoms  See AVS re: what to watch for

## 2014-08-29 NOTE — Assessment & Plan Note (Signed)
L forearm- already drying up  Disc topial care- abx oint/soap and water and otc cortisone cream Disc avoidance  Update if not starting to improve in a week or if worsening

## 2014-08-29 NOTE — Assessment & Plan Note (Signed)
L hip - 1 cm of induration and erythema - no bullseye shape or rash  No s/s of tick fever inst re: care and update if worse or not improved or new symptoms  See AVS re: what to watch for

## 2014-08-29 NOTE — Patient Instructions (Signed)
Keep tick bite clean with soap and water- if it develops a target or bullseye rash - let us know, if fever or illness let us know  For poison ivy- keep clean with soap and water- antibiotic ointment and over the counter cortisone cream is fine for both that and the tick bite  Keep covered outside and use insect repellent

## 2014-08-31 ENCOUNTER — Encounter: Payer: Self-pay | Admitting: Physical Medicine & Rehabilitation

## 2014-08-31 ENCOUNTER — Encounter: Payer: Medicare Other | Attending: Registered Nurse

## 2014-08-31 ENCOUNTER — Ambulatory Visit (HOSPITAL_BASED_OUTPATIENT_CLINIC_OR_DEPARTMENT_OTHER): Payer: Medicare Other | Admitting: Physical Medicine & Rehabilitation

## 2014-08-31 VITALS — BP 129/61 | HR 60 | Resp 14

## 2014-08-31 DIAGNOSIS — M4806 Spinal stenosis, lumbar region: Secondary | ICD-10-CM | POA: Diagnosis not present

## 2014-08-31 DIAGNOSIS — M1611 Unilateral primary osteoarthritis, right hip: Secondary | ICD-10-CM

## 2014-08-31 DIAGNOSIS — Z79899 Other long term (current) drug therapy: Secondary | ICD-10-CM | POA: Diagnosis not present

## 2014-08-31 DIAGNOSIS — M222X1 Patellofemoral disorders, right knee: Secondary | ICD-10-CM | POA: Diagnosis not present

## 2014-08-31 DIAGNOSIS — M48062 Spinal stenosis, lumbar region with neurogenic claudication: Secondary | ICD-10-CM

## 2014-08-31 DIAGNOSIS — M1711 Unilateral primary osteoarthritis, right knee: Secondary | ICD-10-CM | POA: Insufficient documentation

## 2014-08-31 DIAGNOSIS — I701 Atherosclerosis of renal artery: Secondary | ICD-10-CM

## 2014-08-31 DIAGNOSIS — Z5181 Encounter for therapeutic drug level monitoring: Secondary | ICD-10-CM | POA: Diagnosis not present

## 2014-08-31 DIAGNOSIS — M25561 Pain in right knee: Secondary | ICD-10-CM

## 2014-08-31 DIAGNOSIS — G894 Chronic pain syndrome: Secondary | ICD-10-CM | POA: Diagnosis not present

## 2014-08-31 MED ORDER — HYDROCODONE-ACETAMINOPHEN 5-325 MG PO TABS: 1.0000 | ORAL_TABLET | Freq: Three times a day (TID) | ORAL | Status: DC

## 2014-08-31 NOTE — Patient Instructions (Signed)
Try using Voltaren gel to the right ankle 4 times per day

## 2014-08-31 NOTE — Progress Notes (Signed)
Subjective:    Patient ID: Kristopher [name], male    DOB: 1930/08/11, 81 y.o.   MRN: [id number]  HPI Right foot and ankle go numb with prolonged sitting Right hip ok RIght knee still doing ok from last injection several months ago  Pain Inventory Average Pain 3 Pain Right Now 2 My pain is intermittent, tingling and aching  In the last 24 hours, has pain interfered with the following? General activity 5 Relation with others 5 Enjoyment of life 5 What TIME of day is your pain at its worst? varies Sleep (in general) Poor  Pain is worse with: unsure Pain improves with: medication Relief from Meds: 9  Mobility walk without assistance walk with assistance use a cane how many minutes can you walk? 25-30 ability to climb steps?  yes do you drive?  yes  Function retired  Neuro/Psych weakness tingling  Prior Studies Any changes since last visit?  no  Physicians involved in your care Any changes since last visit?  no   Family History  Problem Relation Age of Onset  . Heart disease Mother   . Heart disease Father    History   Social History  . Marital Status: Widowed    Spouse Name: N/A  . Number of Children: N/A  . Years of Education: GED   Occupational History  . retired    Social History Main Topics  . Smoking status: Former Smoker -- 3.00 packs/day for 38 years    Quit date: 04/10/1988  . Smokeless tobacco: Never Used  . Alcohol Use: No  . Drug Use: No  . Sexual Activity: Not on file   Other Topics Concern  . None   Social History Narrative   Past Surgical History  Procedure Laterality Date  . Pci of lad  05/1996    STENT X 4 TOTAL  . Hemorrhoid surgery  1954  . Cataract extraction w/ intraocular lens  implant, bilateral  YRS AGO  . Vasectomy  1968  . Transurethral resection of prostate  08/11/2011    Procedure: TRANSURETHRAL RESECTION OF THE PROSTATE WITH GYRUS INSTRUMENTS;  Surgeon: Fredricka Bonine, MD;  Location: [facility];  Service:  Urology;  Laterality: N/A;     . Coronary angioplasty with stent placement    . Lower extremity angiogram Bilateral 09/20/2011    Procedure: LOWER EXTREMITY ANGIOGRAM;  Surgeon: Wellington Hampshire, MD;  Location: [facility];  Service: Cardiovascular;  Laterality: Bilateral;   Past Medical History  Diagnosis Date  . HTN (hypertension)   . CAD (coronary artery disease)     s/p PCI of LAD 1998;  s/p DES to RCA 05/2009;  Myoview 4/13: low risk, no ischemia, EF 56%  . Aortic stenosis     echo 4/12: normal LVF, mild LAE, no significant AS  . HLD (hyperlipidemia)   . PAD (peripheral artery disease)     s/p L CIA stent 09/2011 ([facility])  . Renal artery stenosis     s/p bilat RA stents  . Esophageal reflux   . DJD (degenerative joint disease), lumbar 07/06/2011  . Urine incontinence   . Acute myocardial infarction, unspecified site, episode of care unspecified 1998  . COPD (chronic obstructive pulmonary disease)   . AAA (abdominal aortic aneurysm)     ultrasound 07/2011:  2.7 x 2.8 cm   BP 129/61 mmHg  Pulse 60  Resp 14  SpO2 97%  Opioid Risk Score:   Fall Risk Score: Moderate Fall Risk (6-13 points)`1  Depression screen PHQ 2/9  Depression screen [facility] 2/9 07/02/2014 01/15/2014 07/16/2013 01/16/2013  Decreased Interest 0 0 0 0  Down, Depressed, Hopeless 0 0 0 1  PHQ - 2 Score 0 0 0 1  Altered sleeping 0 - - -  Tired, decreased energy 1 - - -  Change in appetite 1 - - -  Feeling bad or failure about yourself  0 - - -  Trouble concentrating 0 - - -  Moving slowly or fidgety/restless 0 - - -  Suicidal thoughts 0 - - -  PHQ-9 Score 2 - - -     Review of Systems  Neurological: Positive for weakness.       Tingling  All other systems reviewed and are negative.      Objective:   Physical Exam  Constitutional: He is oriented to person, place, and time. He appears well-developed and well-nourished.  HENT:  Head: Normocephalic and atraumatic.  Neurological: He is alert and oriented to  person, place, and time.  Psychiatric: He has a normal mood and affect.  Nursing note and vitals reviewed.  No hip pain with ROM Pain with R knee flexion past 100 deg Straight leg raising No evidence of knee effusion on the right side Some tenderness behind the right medial malleolus. No pain with ankle range of motion no ankle joint swelling no soft tissue swelling in the ankle area       Assessment & Plan:  1. Chronic pain syndrome multifactorial overall functioning at an independent level including doing yard work. Continue hydrocodone 2-3 tablets per day 5 mg  2. Right hip osteoarthritis had ultrasound-guided hip injection ~ 7 months ago still doing well with this  3. Right knee arthritis, Has had right knee Corticosteroid  injection with good results several months ago may repeat if pain increases 4. Lumbar spinal stenosis with chronic right 3 and L4 radiculitis, only involving sensory fibers at this point Patient feels like it is not limiting him in any way at the current time. We discussed that if he develops weakness in the right lower extremity we would need to get a lumbar MRI Return to clinic in one month  5. Right ankle pain likely multifactorial, some his neuropathic pain however it also does have some tenderness in the posterior tibial tendon area, advise Voltaren gel trial to that area

## 2014-09-01 ENCOUNTER — Telehealth: Payer: Self-pay | Admitting: *Deleted

## 2014-09-01 NOTE — Telephone Encounter (Signed)
[facility] - Client [facility] Patient Name: Kristopher [name] Gender: Male DOB: December 02, 1930 Age: 81 Y 11 M 4 D Return Phone Number: [phone] (Primary), [phone] (Secondary) Address: City/State/Zip: [place] Client [facility] - Client Client Site [place] - Day Physician [place], [place] Type Call Call Type Triage / Clinical Caller Name Vern Claude Relationship To Patient Friend Appointment Disposition EMR Appointment Attempted - Not Scheduled Info pasted into Epic Yes Return Phone Number [phone] (Primary) Chief Complaint Tick Bite Initial Comment Caller states her boyfriend removed a gray deer tick from buttocks, had some redness, wants to know if medication needed [place] will be seen on Saturday number given to make an appointment PreDisposition Call Doctor Nurse Assessment Nurse: Buck Mam, RN, Trish Date/Time Eilene Ghazi Time): 08/28/2014 1:02:24 PM Confirm and document reason for call. If symptomatic, describe symptoms. ---Patient is calling for self and caller states her boyfriend removed a gray deer tick from buttocks last night, had some redness, wants to know if medication needed. Not sure how long it has been there. Dime size red circle on the are. He is sure he was able to get the head out. Has the patient traveled out of the country within the last 30 days? ---No Does the patient require triage? ---Yes Related visit to physician within the last 2 weeks? ---No Does the PT have any chronic conditions? (i.e. diabetes, asthma, etc.) ---Yes List chronic conditions. ---stints and heart attacks high blood pressure. Guidelines Guideline Title Affirmed Question Affirmed Notes Nurse Date/Time (Eastern Time) Tick Bite [1] 2 to 14 days following tick bite AND [2] widespread rash or headache AND [3] no fever Buck Mam, RN, Trish 08/28/2014 1:06:18  PM Disp. Time Eilene Ghazi Time) Disposition Final User 08/28/2014 1:17:47 PM Call Completed Buck Mam, RN, Trish PLEASE NOTE: All timestamps contained within this report are represented as [place] Standard Time. CONFIDENTIALTY NOTICE: This fax transmission is intended only for the addressee. It contains information that is legally privileged, confidential or otherwise protected from use or disclosure. If you are not the intended recipient, you are strictly prohibited from reviewing, disclosing, copying using or disseminating any of this information or taking any action in reliance on or regarding this information. If you have received this fax in error, please notify us immediately by telephone so that we can arrange for its return to [place]. Phone: [phone], Toll-Free: [phone], Fax: [phone] Page: 2 of 2 Call Id: [id number] 08/28/2014 1:07:42 PM See Physician within 24 Hours Yes Buck Mam, RN, Particia Lather Understands: Yes Disagree/Comply: Comply Care Advice Given Per Guideline SEE PHYSICIAN WITHIN 24 HOURS: * You become worse. CALL BACK IF: * Fever occurs CARE ADVICE given per Tick Bites (Adult) guideline. After Care Instructions Given Call Event Type User Date / Time Description Referrals REFERRED TO PCP OFFICE [facility]

## 2014-09-02 ENCOUNTER — Ambulatory Visit (INDEPENDENT_AMBULATORY_CARE_PROVIDER_SITE_OTHER): Payer: Medicare Other | Admitting: Cardiology

## 2014-09-02 ENCOUNTER — Encounter: Payer: Self-pay | Admitting: Cardiology

## 2014-09-02 VITALS — BP 118/62 | HR 60 | Ht 68.5 in | Wt 151.3 lb

## 2014-09-02 DIAGNOSIS — R079 Chest pain, unspecified: Secondary | ICD-10-CM | POA: Diagnosis not present

## 2014-09-02 DIAGNOSIS — I714 Abdominal aortic aneurysm, without rupture, unspecified: Secondary | ICD-10-CM

## 2014-09-02 DIAGNOSIS — I2510 Atherosclerotic heart disease of native coronary artery without angina pectoris: Secondary | ICD-10-CM | POA: Diagnosis not present

## 2014-09-02 DIAGNOSIS — I701 Atherosclerosis of renal artery: Secondary | ICD-10-CM | POA: Diagnosis not present

## 2014-09-02 DIAGNOSIS — I739 Peripheral vascular disease, unspecified: Secondary | ICD-10-CM

## 2014-09-02 DIAGNOSIS — R0602 Shortness of breath: Secondary | ICD-10-CM

## 2014-09-02 DIAGNOSIS — Z9861 Coronary angioplasty status: Secondary | ICD-10-CM

## 2014-09-02 DIAGNOSIS — E785 Hyperlipidemia, unspecified: Secondary | ICD-10-CM

## 2014-09-02 NOTE — Progress Notes (Signed)
Cardiology Office Note   Date:  09/02/2014   ID:  Kristopher [name], DOB 1930-04-22, MRN [id number]  PCP:  [name], MD  Cardiologist:  Dr. Stanford Breed    Chief Complaint  Patient presents with  . Coronary Artery Disease    6 weeks:  States he has had chest pain x years, minor.  Sob same as last visit.  Seldom has ankle edema.      History of Present Illness: Kristopher [name] is a 81 y.o. male who presents for CAD.  He has a hx. Of s/p PCI of his LAD performed in February 1998. He also has PVD and has had prior bilateral renal artery stenting. Cardiac catheterization in February 2011: EF 60% and no renal artery stenosis, RCA 90% but no obstructive disease in the left system. He had PCI of his right coronary artery with a drug-eluting stent at that time. Echocardiogram in April of 2012 revealed normal LV function, mild LAE, no significant AS. LE angiogram 09/20/11: Small infrarenal aortic aneurysm, no renal artery stenosis, ostial right CIA 60%, proximal left CIA severely calcified with 80-90%, left EIA occluded and filled by collats. He underwent intervention with angioplasty and stenting to the left CIA. 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 Followup recommended one year. H/O asymptomatic [place] 1. Nuclear study March 2016 with ejection fraction 51%, diaphragmatic attenuation, no ischemia. Since last seen, he has some dyspnea on exertion but no orthopnea, PND or pedal edema. He occasionally feels chest pain with exertion, mowing the grass with self propelled mower.  The dull pressure may last about 1.5 min and resolve.  He may become mildly diaphoretic with the pain.  He is DOE on some days walking to [employer] and back.   No syncope. Patient is concerned that recent stress test was unrevealing because he did not feel any sensation with the Lexiscan injection. He had discussed this concern with Dr.  Stanford Breed.      Past Medical History  Diagnosis Date  . HTN (hypertension)   . CAD (coronary artery disease)     s/p PCI of LAD 1998;  s/p DES to RCA 05/2009;  Myoview 4/13: low risk, no ischemia, EF 56%  . Aortic stenosis     echo 4/12: normal LVF, mild LAE, no significant AS  . HLD (hyperlipidemia)   . PAD (peripheral artery disease)     s/p L CIA stent 09/2011 ([facility])  . Renal artery stenosis     s/p bilat RA stents  . Esophageal reflux   . DJD (degenerative joint disease), lumbar 07/06/2011  . Urine incontinence   . Acute myocardial infarction, unspecified site, episode of care unspecified 1998  . COPD (chronic obstructive pulmonary disease)   . AAA (abdominal aortic aneurysm)     ultrasound 07/2011:  2.7 x 2.8 cm    Past Surgical History  Procedure Laterality Date  . Pci of lad  05/1996    STENT X 4 TOTAL  . Hemorrhoid surgery  1954  . Cataract extraction w/ intraocular lens  implant, bilateral  YRS AGO  . Vasectomy  1968  . Transurethral resection of prostate  08/11/2011    Procedure: TRANSURETHRAL RESECTION OF THE PROSTATE WITH GYRUS INSTRUMENTS;  Surgeon: Fredricka Bonine, MD;  Location: [facility];  Service: Urology;  Laterality: N/A;     . Coronary angioplasty with stent placement    . Lower extremity  angiogram Bilateral 09/20/2011    Procedure: LOWER EXTREMITY ANGIOGRAM;  Surgeon: Wellington Hampshire, MD;  Location: [facility];  Service: Cardiovascular;  Laterality: Bilateral;     Current Outpatient Prescriptions  Medication Sig Dispense Refill  . Ascorbic Acid (VITAMIN C) 500 MG tablet Take 500 mg by mouth 2 (two) times daily.     Marland Kitchen aspirin 325 MG EC tablet Take 325 mg by mouth daily.    Marland Kitchen atorvastatin (LIPITOR) 80 MG tablet Take 1 tablet (80 mg total) by mouth daily. 90 tablet 3  . BEE POLLEN PO Take by mouth.    Marland Kitchen Co-Enzyme Q-10 100 MG CAPS Take 1 capsule by mouth daily.    . diclofenac sodium (VOLTAREN) 1 % GEL Apply 2 g topically 4 (four) times daily. 9 Tube 1    . fish oil-omega-3 fatty acids 1000 MG capsule Take 1 g by mouth daily.     Marland Kitchen HYDROcodone-acetaminophen (NORCO/VICODIN) 5-325 MG per tablet Take 1 tablet by mouth 3 (three) times daily before meals. 90 tablet 0  . Multiple Vitamin (MULTIVITAMIN) capsule Take 1 capsule by mouth daily.     . Multiple Vitamins-Minerals (PRESERVISION AREDS PO) Take 2 tablets by mouth daily.    . nitroGLYCERIN (NITROSTAT) 0.4 MG SL tablet Place 1 tablet (0.4 mg total) under the tongue every 5 (five) minutes as needed for chest pain. 90 tablet 3  . omeprazole (PRILOSEC) 20 MG capsule TAKE 2 CAPSULES DAILY 180 capsule 3  . ramipril (ALTACE) 10 MG capsule Take 1 capsule (10 mg total) by mouth daily. 90 capsule 3   No current facility-administered medications for this visit.    Allergies:   Influenza vaccines and Sulfa antibiotics    Social History:  The patient  reports that he quit smoking about 26 years ago. He has never used smokeless tobacco. He reports that he does not drink alcohol or use illicit drugs.   Family History:  The patient's family history includes Heart disease in his father and mother.    ROS:  General:no colds or fevers, mild weight changes Skin:no rashes or ulcers HEENT:no blurred vision, no congestion CV:see HPI PUL:see HPI GI:no diarrhea constipation or melena, no indigestion GU:no hematuria, no dysuria MS:no joint pain, no claudication Neuro:no syncope, no lightheadedness Endo:no diabetes, no thyroid disease  Wt Readings from Last 3 Encounters:  09/02/14 151 lb 4.8 oz (68.629 kg)  08/29/14 154 lb 8 oz (70.081 kg)  07/14/14 153 lb 14.4 oz (69.809 kg)     PHYSICAL EXAM: VS:  BP 118/62 mmHg  Pulse 60  Ht 5' 8.5" (1.74 m)  Wt 151 lb 4.8 oz (68.629 kg)  BMI 22.67 kg/m2 , BMI Body mass index is 22.67 kg/(m^2). General:Pleasant affect, NAD Skin:Warm and dry, brisk capillary refill HEENT:normocephalic, sclera clear, mucus membranes moist Neck:supple, no JVD, no bruits   Heart:S1S2 RRR without murmur, gallup, rub or click Lungs:clear without rales, rhonchi, or wheezes XAJ:OINO, non tender, + BS, do not palpate liver spleen or masses Ext:no lower ext edema, 2+ pedal pulses, 2+ radial pulses Neuro:alert and oriented X 3, MAE, follows commands, + facial symmetry    EKG:  EKG is NOT ordered today.    Recent Labs: 01/15/2014: ALT 31; TSH 3.03 06/30/2014: Hemoglobin 14.1; Platelets 215 08/04/2014: BUN 23; Creatinine 0.99; Potassium 3.9; Sodium 145    Lipid Panel    Component Value Date/Time   CHOL 143 01/15/2014 1104   TRIG 72.0 01/15/2014 1104   HDL 45.80 01/15/2014 1104  CHOLHDL 3 01/15/2014 1104   VLDL 14.4 01/15/2014 1104   LDLCALC 83 01/15/2014 1104       Other studies Reviewed: Additional studies/ records that were reviewed today include: previous notes.   ASSESSMENT AND PLAN:  CAD S/P LAD PCI 1998, RCA DES 2011 Status post percutaneous intervention to the left anterior descending in 1998, RCA DES 2011  A nuclear study was performed on  April 18, 2005. At that time, his ejection fraction was 55%. There  was a prior inferior infarct, but no ischemia. His most recent  echocardiogram in 2012 showed normal LV function.  Recent Nuc study 06/30/14 with low risk study. Low nl EF Stable angina now.  I will notify Dr. Thresa Ross of continued symptoms.  He will see Dr. Thresa Ross in 2 months after AAA ultrasound.   Peripheral vascular disease Multiple PTA, last intervention 2013   Essential hypertension Controlled   Hyperlipidemia On statin   Chronic pain syndrome Followed by Dr Letta Pate, widespread DJD    Current medicines are reviewed with the patient today.  The patient Has no concerns regarding medicines.  The following changes have been made:  See above Labs/ tests ordered today include:see above  Disposition:   FU:  see above  Signed, Isaiah Serge, NP  09/02/2014 1:57 PM    [facility]  [facility] [place], [place], [address], [place] Phone: [phone]; Fax: [phone]

## 2014-09-02 NOTE — Patient Instructions (Addendum)
Your physician recommends that you schedule a follow-up appointment in: 2 Months with Dr Stanford Breed  If chest discomfort and SOB increases please give our office a call # [phone]

## 2014-09-10 ENCOUNTER — Encounter (HOSPITAL_COMMUNITY): Payer: Medicare Other

## 2014-09-10 ENCOUNTER — Ambulatory Visit (HOSPITAL_COMMUNITY)
Admission: RE | Admit: 2014-09-10 | Discharge: 2014-09-10 | Disposition: A | Discharge status: Home / Self Care | Payer: Medicare Other | Source: Ambulatory Visit | Attending: Cardiovascular Disease | Admitting: Cardiovascular Disease

## 2014-09-10 DIAGNOSIS — I714 Abdominal aortic aneurysm, without rupture, unspecified: Secondary | ICD-10-CM

## 2014-10-05 ENCOUNTER — Other Ambulatory Visit: Payer: Self-pay | Admitting: Physical Medicine & Rehabilitation

## 2014-10-05 ENCOUNTER — Ambulatory Visit (HOSPITAL_BASED_OUTPATIENT_CLINIC_OR_DEPARTMENT_OTHER): Payer: Medicare Other | Admitting: Physical Medicine & Rehabilitation

## 2014-10-05 ENCOUNTER — Encounter: Payer: Medicare Other | Attending: Registered Nurse

## 2014-10-05 ENCOUNTER — Encounter: Payer: Self-pay | Admitting: Physical Medicine & Rehabilitation

## 2014-10-05 VITALS — BP 128/66 | HR 60 | Resp 14

## 2014-10-05 DIAGNOSIS — M5417 Radiculopathy, lumbosacral region: Secondary | ICD-10-CM | POA: Diagnosis not present

## 2014-10-05 DIAGNOSIS — I701 Atherosclerosis of renal artery: Secondary | ICD-10-CM

## 2014-10-05 DIAGNOSIS — M48062 Spinal stenosis, lumbar region with neurogenic claudication: Secondary | ICD-10-CM

## 2014-10-05 DIAGNOSIS — M25561 Pain in right knee: Secondary | ICD-10-CM

## 2014-10-05 DIAGNOSIS — M1611 Unilateral primary osteoarthritis, right hip: Secondary | ICD-10-CM | POA: Diagnosis not present

## 2014-10-05 DIAGNOSIS — M222X1 Patellofemoral disorders, right knee: Secondary | ICD-10-CM | POA: Diagnosis not present

## 2014-10-05 DIAGNOSIS — M5416 Radiculopathy, lumbar region: Secondary | ICD-10-CM

## 2014-10-05 DIAGNOSIS — G894 Chronic pain syndrome: Secondary | ICD-10-CM | POA: Insufficient documentation

## 2014-10-05 DIAGNOSIS — Z79899 Other long term (current) drug therapy: Secondary | ICD-10-CM

## 2014-10-05 DIAGNOSIS — Z5181 Encounter for therapeutic drug level monitoring: Secondary | ICD-10-CM | POA: Insufficient documentation

## 2014-10-05 DIAGNOSIS — M1711 Unilateral primary osteoarthritis, right knee: Secondary | ICD-10-CM | POA: Insufficient documentation

## 2014-10-05 DIAGNOSIS — M4806 Spinal stenosis, lumbar region: Secondary | ICD-10-CM | POA: Diagnosis not present

## 2014-10-05 MED ORDER — HYDROCODONE-ACETAMINOPHEN 5-325 MG PO TABS: 1.0000 | ORAL_TABLET | Freq: Three times a day (TID) | ORAL | Status: DC

## 2014-10-05 NOTE — Progress Notes (Signed)
Subjective:    Patient ID: Kristopher [name], male    DOB: [birth year], 81 y.o.   MRN: [id number]  HPI CC:  Low back pain  No hip or knee pain at present. Has numbness and tingling R medial leg Uses push mower, slowly goes up and down steps Pain Inventory Average Pain 3 Pain Right Now 4 My pain is constant, sharp, burning, dull and aching  In the last 24 hours, has pain interfered with the following? General activity 5 Relation with others 5 Enjoyment of life 5 What TIME of day is your pain at its worst? varies Sleep (in general) Poor  Pain is worse with: unsure Pain improves with: medication Relief from Meds: 9  Mobility walk without assistance walk with assistance use a cane how many minutes can you walk? 25-30 ability to climb steps?  yes do you drive?  yes  Function retired  Neuro/Psych weakness tingling  Prior Studies Any changes since last visit?  no  Physicians involved in your care Any changes since last visit?  no   Family History  Problem Relation Age of Onset  . Heart disease Mother   . Heart disease Father    History   Social History  . Marital Status: Widowed    Spouse Name: N/A  . Number of Children: N/A  . Years of Education: GED   Occupational History  . retired    Social History Main Topics  . Smoking status: Former Smoker -- 3.00 packs/day for 38 years    Quit date: 04/10/1988  . Smokeless tobacco: Never Used  . Alcohol Use: No  . Drug Use: No  . Sexual Activity: Not on file   Other Topics Concern  . None   Social History Narrative   Past Surgical History  Procedure Laterality Date  . Pci of lad  05/1996    STENT X 4 TOTAL  . Hemorrhoid surgery  1954  . Cataract extraction w/ intraocular lens  implant, bilateral  YRS AGO  . Vasectomy  1968  . Transurethral resection of prostate  08/11/2011    Procedure: TRANSURETHRAL RESECTION OF THE PROSTATE WITH GYRUS INSTRUMENTS;  Surgeon: Fredricka Bonine, MD;  Location: [facility];  Service: Urology;  Laterality: N/A;     . Coronary angioplasty with stent placement    . Lower extremity angiogram Bilateral 09/20/2011    Procedure: LOWER EXTREMITY ANGIOGRAM;  Surgeon: Wellington Hampshire, MD;  Location: [place] [facility];  Service: Cardiovascular;  Laterality: Bilateral;   Past Medical History  Diagnosis Date  . HTN (hypertension)   . CAD (coronary artery disease)     s/p PCI of LAD 1998;  s/p DES to RCA 05/2009;  Myoview 4/13: low risk, no ischemia, EF 56%  . Aortic stenosis     echo 4/12: normal LVF, mild LAE, no significant AS  . HLD (hyperlipidemia)   . PAD (peripheral artery disease)     s/p L CIA stent 09/2011 ([facility])  . Renal artery stenosis     s/p bilat RA stents  . Esophageal reflux   . DJD (degenerative joint disease), lumbar 07/06/2011  . Urine incontinence   . Acute myocardial infarction, unspecified site, episode of care unspecified 1998  . COPD (chronic obstructive pulmonary disease)   . AAA (abdominal aortic aneurysm)     ultrasound 07/2011:  2.7 x 2.8 cm   BP 128/66 mmHg  Pulse 60  Resp 14  SpO2 97%  Opioid Risk Score:   Fall Risk  Score: Moderate Fall Risk (6-13 points) (pt declined educational material during today;'s visit)`1  Depression screen PHQ 2/9  Depression screen [facility] 2/9 07/02/2014 01/15/2014 07/16/2013 01/16/2013  Decreased Interest 0 0 0 0  Down, Depressed, Hopeless 0 0 0 1  PHQ - 2 Score 0 0 0 1  Altered sleeping 0 - - -  Tired, decreased energy 1 - - -  Change in appetite 1 - - -  Feeling bad or failure about yourself  0 - - -  Trouble concentrating 0 - - -  Moving slowly or fidgety/restless 0 - - -  Suicidal thoughts 0 - - -  PHQ-9 Score 2 - - -     Review of Systems  Neurological: Positive for weakness.       Tingling  All other systems reviewed and are negative.      Objective:   Physical Exam  Constitutional: He is oriented to person, place, and time. He appears well-developed and well-nourished.  HENT:  Head:  Normocephalic and atraumatic.  Eyes: Pupils are equal, round, and reactive to light.  Neurological: He is alert and oriented to person, place, and time.  Psychiatric: He has a normal mood and affect.  Nursing note and vitals reviewed.   Right ankle DF 4/5 otherwise 5/5 in ble In sedation is reduced in the right L4 nerve root distribution      Assessment & Plan:  1. Chronic pain syndrome multifactorial overall functioning at an independent level including doing yard work. Continue hydrocodone 2-3 tablets per day 5 mg  2. Right hip osteoarthritis had ultrasound-guided hip injection ~ 7 months ago still doing well with this  3. Right knee arthritis, Has had right knee Corticosteroid  injection with good results several months ago may repeat if pain increases,Uses Voltaren gel about every other day no longer using it 4 times per day 4. Lumbar spinal stenosis with chronic right 3 and L4 radiculitis,mainly sensory, may have mild ADF weakness Patient feels like it is not limiting him in any way at the current time. He is not inclined to pursue further work up at this time.  We discussed that if he develops increased weakness in the right lower extremity we would need to get a lumbar MRI, no need for one at the current time, symptoms are stable Return to clinic in one month, MD  5. Right ankle pain likely multifactorial, some his neuropathic pain however it also stable and mild

## 2014-10-06 LAB — PMP ALCOHOL METABOLITE (ETG): Ethyl Glucuronide (EtG): NEGATIVE ng/mL

## 2014-10-08 LAB — OPIATES/OPIOIDS (LC/MS-MS)
Codeine Urine: NEGATIVE ng/mL (ref <50)
Hydrocodone: 2532 ng/mL (ref <50)
Hydromorphone: 566 ng/mL (ref <50)
Morphine Urine: NEGATIVE ng/mL (ref <50)
Norhydrocodone, Ur: 3686 ng/mL (ref <50)
Noroxycodone, Ur: NEGATIVE ng/mL (ref <50)
Oxycodone, ur: NEGATIVE ng/mL (ref <50)
Oxymorphone: NEGATIVE ng/mL (ref <50)

## 2014-10-09 LAB — PRESCRIPTION MONITORING PROFILE (SOLSTAS)
Amphetamine/Meth: NEGATIVE ng/mL
Barbiturate Screen, Urine: NEGATIVE ng/mL
Benzodiazepine Screen, Urine: NEGATIVE ng/mL
Buprenorphine, Urine: NEGATIVE ng/mL
Cannabinoid Scrn, Ur: NEGATIVE ng/mL
Carisoprodol, Urine: NEGATIVE ng/mL
Cocaine Metabolites: NEGATIVE ng/mL
Creatinine, Urine: 245.8 mg/dL (ref >20.0)
Fentanyl, Ur: NEGATIVE ng/mL
MDMA URINE: NEGATIVE ng/mL
Meperidine, Ur: NEGATIVE ng/mL
Methadone Screen, Urine: NEGATIVE ng/mL
Nitrites, Initial: NEGATIVE ug/mL
Oxycodone Screen, Ur: NEGATIVE ng/mL
Propoxyphene: NEGATIVE ng/mL
Tapentadol, urine: NEGATIVE ng/mL
Tramadol Scrn, Ur: NEGATIVE ng/mL
Zolpidem, Urine: NEGATIVE ng/mL
pH, Initial: 5.2 pH (ref 4.5–8.9)

## 2014-10-20 NOTE — Progress Notes (Signed)
Urine drug screen for this encounter is consistent for prescribed medication 

## 2014-11-03 ENCOUNTER — Ambulatory Visit (HOSPITAL_BASED_OUTPATIENT_CLINIC_OR_DEPARTMENT_OTHER): Payer: Medicare Other | Admitting: Physical Medicine & Rehabilitation

## 2014-11-03 ENCOUNTER — Encounter: Payer: Medicare Other | Attending: Registered Nurse

## 2014-11-03 ENCOUNTER — Encounter: Payer: Self-pay | Admitting: Physical Medicine & Rehabilitation

## 2014-11-03 VITALS — BP 116/58 | HR 55 | Resp 14

## 2014-11-03 DIAGNOSIS — Z5181 Encounter for therapeutic drug level monitoring: Secondary | ICD-10-CM | POA: Diagnosis not present

## 2014-11-03 DIAGNOSIS — M5417 Radiculopathy, lumbosacral region: Secondary | ICD-10-CM

## 2014-11-03 DIAGNOSIS — M222X1 Patellofemoral disorders, right knee: Secondary | ICD-10-CM

## 2014-11-03 DIAGNOSIS — Z79899 Other long term (current) drug therapy: Secondary | ICD-10-CM | POA: Insufficient documentation

## 2014-11-03 DIAGNOSIS — M1611 Unilateral primary osteoarthritis, right hip: Secondary | ICD-10-CM

## 2014-11-03 DIAGNOSIS — G894 Chronic pain syndrome: Secondary | ICD-10-CM | POA: Insufficient documentation

## 2014-11-03 DIAGNOSIS — M1711 Unilateral primary osteoarthritis, right knee: Secondary | ICD-10-CM | POA: Insufficient documentation

## 2014-11-03 DIAGNOSIS — M48062 Spinal stenosis, lumbar region with neurogenic claudication: Secondary | ICD-10-CM

## 2014-11-03 DIAGNOSIS — M25561 Pain in right knee: Secondary | ICD-10-CM

## 2014-11-03 DIAGNOSIS — I701 Atherosclerosis of renal artery: Secondary | ICD-10-CM | POA: Diagnosis not present

## 2014-11-03 DIAGNOSIS — M4806 Spinal stenosis, lumbar region: Secondary | ICD-10-CM

## 2014-11-03 DIAGNOSIS — M5416 Radiculopathy, lumbar region: Secondary | ICD-10-CM

## 2014-11-03 MED ORDER — HYDROCODONE-ACETAMINOPHEN 5-325 MG PO TABS: 1.0000 | ORAL_TABLET | Freq: Three times a day (TID) | ORAL | Status: DC

## 2014-11-03 NOTE — Patient Instructions (Signed)
Please call if you have worsening right thigh pain or right thigh numbness or weakness

## 2014-11-03 NOTE — Progress Notes (Signed)
Subjective:    Patient ID: Kristopher [name], male    DOB: 08-20-1930, 81 y.o.   MRN: [id number]  HPI Mowing grass twice a week, Self-propelled mower walks behind it  Right thigh pain, Has been going up and down stairs a lot recently.  Some tingling in the right thigh but this is not severe.  Patient is independent with all his mobility as well as his self-care. He is also doing yard work.    Pain Inventory Average Pain 4 Pain Right Now 3 My pain is constant, sharp, burning, dull and aching  In the last 24 hours, has pain interfered with the following? General activity 5 Relation with others 5 Enjoyment of life 5 What TIME of day is your pain at its worst? evening Sleep (in general) Fair  Pain is worse with: unsure Pain improves with: rest, medication and injections Relief from Meds: 3  Mobility walk without assistance walk with assistance use a cane how many minutes can you walk? 25-30 ability to climb steps?  yes do you drive?  yes  Function retired  Neuro/Psych weakness tingling  Prior Studies Any changes since last visit?  no  Physicians involved in your care Any changes since last visit?  no   Family History  Problem Relation Age of Onset  . Heart disease Mother   . Heart disease Father    History   Social History  . Marital Status: Widowed    Spouse Name: N/A  . Number of Children: N/A  . Years of Education: GED   Occupational History  . retired    Social History Main Topics  . Smoking status: Former Smoker -- 3.00 packs/day for 38 years    Quit date: 04/10/1988  . Smokeless tobacco: Never Used  . Alcohol Use: No  . Drug Use: No  . Sexual Activity: Not on file   Other Topics Concern  . None   Social History Narrative   Past Surgical History  Procedure Laterality Date  . Pci of lad  05/1996    STENT X 4 TOTAL  . Hemorrhoid surgery  1954  . Cataract extraction w/ intraocular lens  implant, bilateral  YRS AGO  . Vasectomy  1968    . Transurethral resection of prostate  08/11/2011    Procedure: TRANSURETHRAL RESECTION OF THE PROSTATE WITH GYRUS INSTRUMENTS;  Surgeon: Fredricka Bonine, MD;  Location: [facility];  Service: Urology;  Laterality: N/A;     . Coronary angioplasty with stent placement    . Lower extremity angiogram Bilateral 09/20/2011    Procedure: LOWER EXTREMITY ANGIOGRAM;  Surgeon: Wellington Hampshire, MD;  Location: [place] [facility];  Service: Cardiovascular;  Laterality: Bilateral;   Past Medical History  Diagnosis Date  . HTN (hypertension)   . CAD (coronary artery disease)     s/p PCI of LAD 1998;  s/p DES to RCA 05/2009;  Myoview 4/13: low risk, no ischemia, EF 56%  . Aortic stenosis     echo 4/12: normal LVF, mild LAE, no significant AS  . HLD (hyperlipidemia)   . PAD (peripheral artery disease)     s/p L CIA stent 09/2011 ([facility])  . Renal artery stenosis     s/p bilat RA stents  . Esophageal reflux   . DJD (degenerative joint disease), lumbar 07/06/2011  . Urine incontinence   . Acute myocardial infarction, unspecified site, episode of care unspecified 1998  . COPD (chronic obstructive pulmonary disease)   . AAA (abdominal aortic aneurysm)  ultrasound 07/2011:  2.7 x 2.8 cm   BP 116/58 mmHg  Pulse 55  Resp 14  SpO2 97%  Opioid Risk Score:   Fall Risk Score:  `1  Depression screen PHQ 2/9  Depression screen [facility] 2/9 11/03/2014 07/02/2014 01/15/2014 07/16/2013 01/16/2013  Decreased Interest 0 0 0 0 0  Down, Depressed, Hopeless 0 0 0 0 1  PHQ - 2 Score 0 0 0 0 1  Altered sleeping - 0 - - -  Tired, decreased energy - 1 - - -  Change in appetite - 1 - - -  Feeling bad or failure about yourself  - 0 - - -  Trouble concentrating - 0 - - -  Moving slowly or fidgety/restless - 0 - - -  Suicidal thoughts - 0 - - -  PHQ-9 Score - 2 - - -     Review of Systems  Neurological: Positive for weakness.       Tingling   All other systems reviewed and are negative.      Objective:   Physical  Exam  Constitutional: He is oriented to person, place, and time.  Neurological: He is alert and oriented to person, place, and time. He has normal strength.  Reflex Scores:      Patellar reflexes are 0 on the right side and 1+ on the left side.      Achilles reflexes are 0 on the right side and 0 on the left side. Decreased sensation to pinprick right L3 dermatome  Strength is 5/5 bilateral hip flexor and knee extensor and ankle dorsiflexor  Negative straight leg raising test  Psychiatric: He has a normal mood and affect.  Nursing note and vitals reviewed.   Positive right femoral stretch test Tenderness to palpation right quadricep around the rectus femoris     Assessment & Plan:  1. Lumbar stenosis with right L3 radiculopathy. His symptoms have been stable and mild. Mainly sensory. At this point patient does not wish to pursue further workup. If he has worsening weakness in the right thigh or worsening numbness would recommend MRI  And then likely would need epidural injection. 2. Right knee osteoarthritis has done well after knee injection his current complaints do not appear to be in the right knee joint.  3. History of right hip osteoarthritis this also appears to be doing okay. No need for reinjection at the current time  For his chronic pain will continue his current dose of hydrocodone 5 mg 3 times per day.  Follow-up with nurse practitioner next month

## 2014-11-27 NOTE — Progress Notes (Signed)
HPI: FU CAD, s/p PCI of his LAD performed in February 1998. He also has PVD and has had prior bilateral renal artery stenting. Cardiac catheterization in February 2011: EF 60% and no renal artery stenosis, RCA 90% but no obstructive disease in the left system. He had PCI of his right coronary artery with a drug-eluting stent at that time. Echocardiogram in April of 2012 revealed normal LV function, mild LAE, no significant AS. LE angiogram 09/20/11: Small infrarenal aortic aneurysm, no renal artery stenosis, ostial right CIA 60%, proximal left CIA severely calcified with 80-90%, left EIA occluded and filled by collats. He underwent intervention with angioplasty and stenting to the left CIA. H/O asymptomatic [place] 1. Nuclear study March 2016 with ejection fraction 51%, diaphragmatic attenuation, no ischemia. Abdominal ultrasound June 2016 showed a 2.8 x 2.7 cm abdominal aortic aneurysm and 50-69% right common iliac artery. Since last seen, the patient has dyspnea with more extreme activities but not with routine activities. It is relieved with rest. It is not associated with chest pain. There is no orthopnea, PND or pedal edema. There is no syncope or palpitations. There is no exertional chest pain.   Current Outpatient Prescriptions  Medication Sig Dispense Refill  . Ascorbic Acid (VITAMIN C) 500 MG tablet Take 500 mg by mouth 2 (two) times daily.     Marland Kitchen aspirin 325 MG EC tablet Take 325 mg by mouth daily.    Marland Kitchen atorvastatin (LIPITOR) 80 MG tablet Take 1 tablet (80 mg total) by mouth daily. 90 tablet 3  . BEE POLLEN PO Take by mouth.    Marland Kitchen Co-Enzyme Q-10 100 MG CAPS Take 1 capsule by mouth daily.    . diclofenac sodium (VOLTAREN) 1 % GEL Apply 2 g topically 4 (four) times daily. 9 Tube 1  . fish oil-omega-3 fatty acids 1000 MG capsule Take 1 g by mouth daily.     Marland Kitchen HYDROcodone-acetaminophen (NORCO/VICODIN) 5-325 MG per tablet Take 1 tablet by mouth 3 (three) times daily before meals. 90 tablet 0    . Multiple Vitamin (MULTIVITAMIN) capsule Take 1 capsule by mouth daily.     . Multiple Vitamins-Minerals (PRESERVISION AREDS PO) Take 2 tablets by mouth daily.    . nitroGLYCERIN (NITROSTAT) 0.4 MG SL tablet Place 1 tablet (0.4 mg total) under the tongue every 5 (five) minutes as needed for chest pain. 90 tablet 3  . omeprazole (PRILOSEC) 20 MG capsule TAKE 2 CAPSULES DAILY 180 capsule 3  . ramipril (ALTACE) 10 MG capsule Take 1 capsule (10 mg total) by mouth daily. 90 capsule 3   No current facility-administered medications for this visit.     Past Medical History  Diagnosis Date  . HTN (hypertension)   . CAD (coronary artery disease)     s/p PCI of LAD 1998;  s/p DES to RCA 05/2009;  Myoview 4/13: low risk, no ischemia, EF 56%  . Aortic stenosis     echo 4/12: normal LVF, mild LAE, no significant AS  . HLD (hyperlipidemia)   . PAD (peripheral artery disease)     s/p L CIA stent 09/2011 ([facility])  . Renal artery stenosis     s/p bilat RA stents  . Esophageal reflux   . DJD (degenerative joint disease), lumbar 07/06/2011  . Urine incontinence   . Acute myocardial infarction, unspecified site, episode of care unspecified 1998  . COPD (chronic obstructive pulmonary disease)   . AAA (abdominal aortic aneurysm)     ultrasound 07/2011:  2.7 x 2.8 cm    Past Surgical History  Procedure Laterality Date  . Pci of lad  05/1996    STENT X 4 TOTAL  . Hemorrhoid surgery  1954  . Cataract extraction w/ intraocular lens  implant, bilateral  YRS AGO  . Vasectomy  1968  . Transurethral resection of prostate  08/11/2011    Procedure: TRANSURETHRAL RESECTION OF THE PROSTATE WITH GYRUS INSTRUMENTS;  Surgeon: Fredricka Bonine, MD;  Location: [facility];  Service: Urology;  Laterality: N/A;     . Coronary angioplasty with stent placement    . Lower extremity angiogram Bilateral 09/20/2011    Procedure: LOWER EXTREMITY ANGIOGRAM;  Surgeon: Wellington Hampshire, MD;  Location: [place] [facility];  Service:  Cardiovascular;  Laterality: Bilateral;    Social History   Social History  . Marital Status: Widowed    Spouse Name: N/A  . Number of Children: N/A  . Years of Education: GED   Occupational History  . retired    Social History Main Topics  . Smoking status: Former Smoker -- 3.00 packs/day for 38 years    Quit date: 04/10/1988  . Smokeless tobacco: Never Used  . Alcohol Use: No  . Drug Use: No  . Sexual Activity: Not on file   Other Topics Concern  . Not on file   Social History Narrative    ROS: arthralgias but no fevers or chills, productive cough, hemoptysis, dysphasia, odynophagia, melena, hematochezia, dysuria, hematuria, rash, seizure activity, orthopnea, PND, pedal edema, claudication. Remaining systems are negative.  Physical Exam: Well-developed well-nourished in no acute distress.  Skin is warm and dry.  HEENT is normal.  Neck is supple.  Chest is clear to auscultation with normal expansion.  Cardiovascular exam is regular rate and rhythm.  Abdominal exam nontender or distended. No masses palpated. Extremities show no edema. neuro grossly intact  ECG sinus bradycardia at a rate of 56. Right bundle branch block.

## 2014-12-02 ENCOUNTER — Ambulatory Visit (INDEPENDENT_AMBULATORY_CARE_PROVIDER_SITE_OTHER): Payer: Medicare Other | Admitting: Cardiology

## 2014-12-02 ENCOUNTER — Encounter: Payer: Self-pay | Admitting: Cardiology

## 2014-12-02 VITALS — BP 134/72 | HR 56 | Ht 68.5 in | Wt 152.2 lb

## 2014-12-02 DIAGNOSIS — E785 Hyperlipidemia, unspecified: Secondary | ICD-10-CM

## 2014-12-02 DIAGNOSIS — I714 Abdominal aortic aneurysm, without rupture, unspecified: Secondary | ICD-10-CM

## 2014-12-02 DIAGNOSIS — I2510 Atherosclerotic heart disease of native coronary artery without angina pectoris: Secondary | ICD-10-CM | POA: Diagnosis not present

## 2014-12-02 DIAGNOSIS — I701 Atherosclerosis of renal artery: Secondary | ICD-10-CM | POA: Diagnosis not present

## 2014-12-02 DIAGNOSIS — I10 Essential (primary) hypertension: Secondary | ICD-10-CM | POA: Diagnosis not present

## 2014-12-02 DIAGNOSIS — I441 Atrioventricular block, second degree: Secondary | ICD-10-CM

## 2014-12-02 DIAGNOSIS — I739 Peripheral vascular disease, unspecified: Secondary | ICD-10-CM

## 2014-12-02 DIAGNOSIS — Z9861 Coronary angioplasty status: Secondary | ICD-10-CM | POA: Diagnosis not present

## 2014-12-02 NOTE — Assessment & Plan Note (Signed)
Follow-up abdominal ultrasound June 2017.

## 2014-12-02 NOTE — Assessment & Plan Note (Signed)
Continue aspirin and statin. 

## 2014-12-02 NOTE — Patient Instructions (Signed)
Your physician wants you to follow-up in: ONE YEAR WITH DR CRENSHAW You will receive a reminder letter in the mail two months in advance. If you don't receive a letter, please call our office to schedule the follow-up appointment.  

## 2014-12-02 NOTE — Assessment & Plan Note (Signed)
History of second-degree AV block type I.no beta blockers.

## 2014-12-02 NOTE — Assessment & Plan Note (Signed)
Blood pressure controlled. Continue present medications. 

## 2014-12-02 NOTE — Assessment & Plan Note (Signed)
Continue statin. 

## 2014-12-04 ENCOUNTER — Encounter: Payer: Medicare Other | Attending: Registered Nurse | Admitting: Registered Nurse

## 2014-12-04 ENCOUNTER — Encounter: Payer: Self-pay | Admitting: Registered Nurse

## 2014-12-04 VITALS — BP 134/59 | HR 75

## 2014-12-04 DIAGNOSIS — Z5181 Encounter for therapeutic drug level monitoring: Secondary | ICD-10-CM | POA: Insufficient documentation

## 2014-12-04 DIAGNOSIS — M1611 Unilateral primary osteoarthritis, right hip: Secondary | ICD-10-CM

## 2014-12-04 DIAGNOSIS — I701 Atherosclerosis of renal artery: Secondary | ICD-10-CM

## 2014-12-04 DIAGNOSIS — G894 Chronic pain syndrome: Secondary | ICD-10-CM | POA: Diagnosis not present

## 2014-12-04 DIAGNOSIS — M5417 Radiculopathy, lumbosacral region: Secondary | ICD-10-CM | POA: Diagnosis not present

## 2014-12-04 DIAGNOSIS — Z79899 Other long term (current) drug therapy: Secondary | ICD-10-CM | POA: Insufficient documentation

## 2014-12-04 DIAGNOSIS — M1711 Unilateral primary osteoarthritis, right knee: Secondary | ICD-10-CM

## 2014-12-04 DIAGNOSIS — M5416 Radiculopathy, lumbar region: Secondary | ICD-10-CM

## 2014-12-04 MED ORDER — HYDROCODONE-ACETAMINOPHEN 5-325 MG PO TABS: 1.0000 | ORAL_TABLET | Freq: Three times a day (TID) | ORAL | Status: DC

## 2014-12-04 NOTE — Progress Notes (Signed)
Subjective:    Patient ID: Kristopher [name], male    DOB: [birth year], 81 y.o.   MRN: [id number]  HPI: [name] is a 81 year old male who returns for follow up for chronic pain and medication refill. He says his pain is located in his right knee and right hip. He rates his pain 4. His current exercise regime is walking and yard work.  Pain Inventory Average Pain 3 Pain Right Now 4 My pain is na  In the last 24 hours, has pain interfered with the following? General activity na Relation with others na Enjoyment of life na What TIME of day is your pain at its worst? na Sleep (in general) NA  Pain is worse with: na Pain improves with: na Relief from Meds: na  Mobility use a cane ability to climb steps?  yes do you drive?  yes  Function Do you have any goals in this area?  no  Neuro/Psych No problems in this area  Prior Studies na  Physicians involved in your care na   Family History  Problem Relation Age of Onset  . Heart disease Mother   . Heart disease Father    Social History   Social History  . Marital Status: Widowed    Spouse Name: N/A  . Number of Children: N/A  . Years of Education: GED   Occupational History  . retired    Social History Main Topics  . Smoking status: Former Smoker -- 3.00 packs/day for 38 years    Quit date: 04/10/1988  . Smokeless tobacco: Never Used  . Alcohol Use: No  . Drug Use: No  . Sexual Activity: Not Asked   Other Topics Concern  . None   Social History Narrative   Past Surgical History  Procedure Laterality Date  . Pci of lad  05/1996    STENT X 4 TOTAL  . Hemorrhoid surgery  1954  . Cataract extraction w/ intraocular lens  implant, bilateral  YRS AGO  . Vasectomy  1968  . Transurethral resection of prostate  08/11/2011    Procedure: TRANSURETHRAL RESECTION OF THE PROSTATE WITH GYRUS INSTRUMENTS;  Surgeon: Fredricka Bonine, MD;  Location: [facility];  Service: Urology;  Laterality: N/A;     .  Coronary angioplasty with stent placement    . Lower extremity angiogram Bilateral 09/20/2011    Procedure: LOWER EXTREMITY ANGIOGRAM;  Surgeon: Wellington Hampshire, MD;  Location: [place];  Service: Cardiovascular;  Laterality: Bilateral;   Past Medical History  Diagnosis Date  . HTN (hypertension)   . CAD (coronary artery disease)     s/p PCI of LAD 1998;  s/p DES to RCA 05/2009;  Myoview 4/13: low risk, no ischemia, EF 56%  . Aortic stenosis     echo 4/12: normal LVF, mild LAE, no significant AS  . HLD (hyperlipidemia)   . PAD (peripheral artery disease)     s/p L CIA stent 09/2011 ([facility])  . Renal artery stenosis     s/p bilat RA stents  . Esophageal reflux   . DJD (degenerative joint disease), lumbar 07/06/2011  . Urine incontinence   . Acute myocardial infarction, unspecified site, episode of care unspecified 1998  . COPD (chronic obstructive pulmonary disease)   . AAA (abdominal aortic aneurysm)     ultrasound 07/2011:  2.7 x 2.8 cm   BP 134/59 mmHg  Pulse 75  SpO2 95%  Opioid Risk Score:   Fall Risk Score:  `  1  Depression screen PHQ 2/9  Depression screen [facility] 2/9 12/04/2014 11/03/2014 07/02/2014 01/15/2014 07/16/2013 01/16/2013  Decreased Interest 0 0 0 0 0 0  Down, Depressed, Hopeless 0 0 0 0 0 1  PHQ - 2 Score 0 0 0 0 0 1  Altered sleeping - - 0 - - -  Tired, decreased energy - - 1 - - -  Change in appetite - - 1 - - -  Feeling bad or failure about yourself  - - 0 - - -  Trouble concentrating - - 0 - - -  Moving slowly or fidgety/restless - - 0 - - -  Suicidal thoughts - - 0 - - -  PHQ-9 Score - - 2 - - -     Review of Systems  All other systems reviewed and are negative.      Objective:   Physical Exam  Constitutional: He is oriented to person, place, and time. He appears well-developed and well-nourished.  HENT:  Head: Normocephalic and atraumatic.  Neck: Normal range of motion. Neck supple.  Cardiovascular: Normal rate and regular rhythm.     Pulmonary/Chest: Effort normal and breath sounds normal.  Musculoskeletal:  Normal Muscle Bulk and Muscle Testing Reveals: Upper Extremities: Full ROM and Muscle Strength 5/5 Lower Extremities: Right: Decreased ROM and Muscle Strength 5/5. Right Lower Extremity Flexion Produces Pain into Patella Left Lower Extremity: Full ROM and Muscle Strength 5/5 Arises from chair slowly using 4 prong cane for support Antalgic Gait  Neurological: He is alert and oriented to person, place, and time.  Skin: Skin is warm and dry.  Psychiatric: He has a normal mood and affect.  Nursing note and vitals reviewed.         Assessment & Plan:  1. Patellofemoral arthritis. Continue Voltaren gel, and continue with exercise regimen.  2 Chronic Low Back Pain/ Lumbar Stenosis with radiculopathy:Refilled: Hydrocodone 5/325 mg one tablet three times a day #90.  20 minutes of face to face patient care time was spent during this visit. All questions were encouraged and answered.  F/U in 1 month

## 2015-01-01 ENCOUNTER — Encounter: Payer: Self-pay | Admitting: Registered Nurse

## 2015-01-01 ENCOUNTER — Encounter: Payer: Medicare Other | Attending: Registered Nurse | Admitting: Registered Nurse

## 2015-01-01 VITALS — BP 128/71 | HR 65

## 2015-01-01 DIAGNOSIS — M1711 Unilateral primary osteoarthritis, right knee: Secondary | ICD-10-CM | POA: Insufficient documentation

## 2015-01-01 DIAGNOSIS — G894 Chronic pain syndrome: Secondary | ICD-10-CM | POA: Insufficient documentation

## 2015-01-01 DIAGNOSIS — I701 Atherosclerosis of renal artery: Secondary | ICD-10-CM | POA: Diagnosis not present

## 2015-01-01 DIAGNOSIS — M48062 Spinal stenosis, lumbar region with neurogenic claudication: Secondary | ICD-10-CM

## 2015-01-01 DIAGNOSIS — M222X1 Patellofemoral disorders, right knee: Secondary | ICD-10-CM

## 2015-01-01 DIAGNOSIS — M1611 Unilateral primary osteoarthritis, right hip: Secondary | ICD-10-CM | POA: Insufficient documentation

## 2015-01-01 DIAGNOSIS — Z5181 Encounter for therapeutic drug level monitoring: Secondary | ICD-10-CM | POA: Diagnosis not present

## 2015-01-01 DIAGNOSIS — M4806 Spinal stenosis, lumbar region: Secondary | ICD-10-CM

## 2015-01-01 DIAGNOSIS — Z79899 Other long term (current) drug therapy: Secondary | ICD-10-CM | POA: Insufficient documentation

## 2015-01-01 DIAGNOSIS — M25561 Pain in right knee: Secondary | ICD-10-CM

## 2015-01-01 MED ORDER — HYDROCODONE-ACETAMINOPHEN 5-325 MG PO TABS: 1.0000 | ORAL_TABLET | Freq: Three times a day (TID) | ORAL | Status: DC

## 2015-01-01 NOTE — Progress Notes (Signed)
Subjective:    Patient ID: Kristopher [name], male    DOB: Oct 31, 1930, 81 y.o.   MRN: [id number]  HPI: Kristopher [name] is a 81 year old male who returns for follow up for chronic pain and medication refill. He says his pain is located in his right leg and right knee. He rates his pain 3. His current exercise regime is walking and yard work.  Pain Inventory Average Pain 4 Pain Right Now 3 My pain is na  In the last 24 hours, has pain interfered with the following? General activity na Relation with others na Enjoyment of life na What TIME of day is your pain at its worst? na Sleep (in general) Fair  Pain is worse with: na Pain improves with: na Relief from Meds: 3  Mobility Do you have any goals in this area?  no  Function Do you have any goals in this area?  no  Neuro/Psych No problems in this area  Prior Studies na  Physicians involved in your care na   Family History  Problem Relation Age of Onset  . Heart disease Mother   . Heart disease Father    Social History   Social History  . Marital Status: Widowed    Spouse Name: N/A  . Number of Children: N/A  . Years of Education: GED   Occupational History  . retired    Social History Main Topics  . Smoking status: Former Smoker -- 3.00 packs/day for 38 years    Quit date: 04/10/1988  . Smokeless tobacco: Never Used  . Alcohol Use: No  . Drug Use: No  . Sexual Activity: Not Asked   Other Topics Concern  . None   Social History Narrative   Past Surgical History  Procedure Laterality Date  . Pci of lad  05/1996    STENT X 4 TOTAL  . Hemorrhoid surgery  1954  . Cataract extraction w/ intraocular lens  implant, bilateral  YRS AGO  . Vasectomy  1968  . Transurethral resection of prostate  08/11/2011    Procedure: TRANSURETHRAL RESECTION OF THE PROSTATE WITH GYRUS INSTRUMENTS;  Surgeon: Fredricka Bonine, MD;  Location: [facility];  Service: Urology;  Laterality: N/A;     . Coronary angioplasty with  stent placement    . Lower extremity angiogram Bilateral 09/20/2011    Procedure: LOWER EXTREMITY ANGIOGRAM;  Surgeon: Wellington Hampshire, MD;  Location: [place] [facility];  Service: Cardiovascular;  Laterality: Bilateral;   Past Medical History  Diagnosis Date  . HTN (hypertension)   . CAD (coronary artery disease)     s/p PCI of LAD 1998;  s/p DES to RCA 05/2009;  Myoview 4/13: low risk, no ischemia, EF 56%  . Aortic stenosis     echo 4/12: normal LVF, mild LAE, no significant AS  . HLD (hyperlipidemia)   . PAD (peripheral artery disease)     s/p L CIA stent 09/2011 ([facility])  . Renal artery stenosis     s/p bilat RA stents  . Esophageal reflux   . DJD (degenerative joint disease), lumbar 07/06/2011  . Urine incontinence   . Acute myocardial infarction, unspecified site, episode of care unspecified 1998  . COPD (chronic obstructive pulmonary disease)   . AAA (abdominal aortic aneurysm)     ultrasound 07/2011:  2.7 x 2.8 cm   BP 96/52 mmHg  Pulse 65  SpO2 95%  Opioid Risk Score:   Fall Risk Score:  `1  Depression  screen PHQ 2/9  Depression screen [facility] 2/9 01/01/2015 12/04/2014 11/03/2014 07/02/2014 01/15/2014 07/16/2013 01/16/2013  Decreased Interest 0 0 0 0 0 0 0  Down, Depressed, Hopeless 0 0 0 0 0 0 1  PHQ - 2 Score 0 0 0 0 0 0 1  Altered sleeping - - - 0 - - -  Tired, decreased energy - - - 1 - - -  Change in appetite - - - 1 - - -  Feeling bad or failure about yourself  - - - 0 - - -  Trouble concentrating - - - 0 - - -  Moving slowly or fidgety/restless - - - 0 - - -  Suicidal thoughts - - - 0 - - -  PHQ-9 Score - - - 2 - - -     Review of Systems  All other systems reviewed and are negative.      Objective:   Physical Exam  Constitutional: He is oriented to person, place, and time. He appears well-developed and well-nourished.  HENT:  Head: Normocephalic and atraumatic.  Neck: Normal range of motion. Neck supple.  Cardiovascular: Normal rate and regular rhythm.     Pulmonary/Chest: Effort normal and breath sounds normal.  Musculoskeletal:  Normal Muscle Bulk and Muscle Testing Reveals: Upper Extremities: Full ROM and Muscle Strength 5/5 Lumbar Paraspinal Tenderness: L-4-Midline Lower Extremities: Left: Full ROM and Muscle Strength 5/5 Right: Decreased ROM and Lower Extremity Flexion Produces Pain into Patella Arises from chair with ease/ Using 4 Prong Cane Antalgic gait   Neurological: He is alert and oriented to person, place, and time.  Skin: Skin is warm and dry.  Psychiatric: He has a normal mood and affect.  Nursing note and vitals reviewed.         Assessment & Plan:  1. Patellofemoral arthritis. Continue Voltaren gel, and continue with exercise regimen.  2 Chronic Low Back Pain/ Lumbar Stenosis with radiculopathy:Refilled: Hydrocodone 5/325 mg one tablet three times a day #90.  20 minutes of face to face patient care time was spent during this visit. All questions were encouraged and answered.  F/U in 1 month

## 2015-01-05 ENCOUNTER — Other Ambulatory Visit: Payer: Self-pay | Admitting: Internal Medicine

## 2015-01-20 ENCOUNTER — Encounter: Payer: Medicare Other | Admitting: Internal Medicine

## 2015-01-22 ENCOUNTER — Encounter: Payer: Self-pay | Admitting: Internal Medicine

## 2015-01-22 ENCOUNTER — Other Ambulatory Visit (INDEPENDENT_AMBULATORY_CARE_PROVIDER_SITE_OTHER): Payer: Medicare Other

## 2015-01-22 ENCOUNTER — Ambulatory Visit (INDEPENDENT_AMBULATORY_CARE_PROVIDER_SITE_OTHER): Payer: Medicare Other | Admitting: Internal Medicine

## 2015-01-22 VITALS — BP 106/60 | HR 67 | Temp 97.5°F | Ht 69.0 in | Wt 152.0 lb

## 2015-01-22 DIAGNOSIS — E785 Hyperlipidemia, unspecified: Secondary | ICD-10-CM

## 2015-01-22 DIAGNOSIS — I10 Essential (primary) hypertension: Secondary | ICD-10-CM | POA: Diagnosis not present

## 2015-01-22 DIAGNOSIS — I701 Atherosclerosis of renal artery: Secondary | ICD-10-CM | POA: Diagnosis not present

## 2015-01-22 DIAGNOSIS — J438 Other emphysema: Secondary | ICD-10-CM

## 2015-01-22 LAB — HEPATIC FUNCTION PANEL
ALT: 22 U/L (ref 0–53)
AST: 22 U/L (ref 0–37)
Albumin: 4 g/dL (ref 3.5–5.2)
Alkaline Phosphatase: 92 U/L (ref 39–117)
Bilirubin, Direct: 0.1 mg/dL (ref 0.0–0.3)
Total Bilirubin: 0.6 mg/dL (ref 0.2–1.2)
Total Protein: 6.7 g/dL (ref 6.0–8.3)

## 2015-01-22 LAB — LIPID PANEL
Cholesterol: 134 mg/dL (ref 0–200)
HDL: 41.8 mg/dL (ref >39.00)
LDL Cholesterol: 67 mg/dL (ref 0–99)
NonHDL: 92.2
Total CHOL/HDL Ratio: 3
Triglycerides: 124 mg/dL (ref 0.0–149.0)
VLDL: 24.8 mg/dL (ref 0.0–40.0)

## 2015-01-22 LAB — TSH: TSH: 2.29 u[IU]/mL (ref 0.35–4.50)

## 2015-01-22 LAB — CBC WITH DIFFERENTIAL/PLATELET
Basophils Absolute: 0 10*3/uL (ref 0.0–0.1)
Basophils Relative: 0.4 % (ref 0.0–3.0)
Eosinophils Absolute: 0.4 10*3/uL (ref 0.0–0.7)
Eosinophils Relative: 4.8 % (ref 0.0–5.0)
HCT: 41.7 % (ref 39.0–52.0)
Hemoglobin: 13.9 g/dL (ref 13.0–17.0)
Lymphocytes Relative: 34.6 % (ref 12.0–46.0)
Lymphs Abs: 3.2 10*3/uL (ref 0.7–4.0)
MCHC: 33.3 g/dL (ref 30.0–36.0)
MCV: 91.3 fl (ref 78.0–100.0)
Monocytes Absolute: 1 10*3/uL (ref 0.1–1.0)
Monocytes Relative: 10.5 % (ref 3.0–12.0)
Neutro Abs: 4.5 10*3/uL (ref 1.4–7.7)
Neutrophils Relative %: 49.7 % (ref 43.0–77.0)
Platelets: 207 10*3/uL (ref 150.0–400.0)
RBC: 4.56 Mil/uL (ref 4.22–5.81)
RDW: 15.9 % — ABNORMAL HIGH (ref 11.5–15.5)
WBC: 9.1 10*3/uL (ref 4.0–10.5)

## 2015-01-22 LAB — BASIC METABOLIC PANEL
BUN: 27 mg/dL — ABNORMAL HIGH (ref 6–23)
CO2: 27 mEq/L (ref 19–32)
Calcium: 9.3 mg/dL (ref 8.4–10.5)
Chloride: 110 mEq/L (ref 96–112)
Creatinine, Ser: 1.09 mg/dL (ref 0.40–1.50)
GFR: 68.78 mL/min (ref >60.00)
Glucose, Bld: 82 mg/dL (ref 70–99)
Potassium: 4.9 mEq/L (ref 3.5–5.1)
Sodium: 144 mEq/L (ref 135–145)

## 2015-01-22 LAB — URINALYSIS, ROUTINE W REFLEX MICROSCOPIC
Bilirubin Urine: NEGATIVE
Hgb urine dipstick: NEGATIVE
Ketones, ur: NEGATIVE
Leukocytes, UA: NEGATIVE
Nitrite: NEGATIVE
RBC / HPF: NONE SEEN (ref 0–?)
Specific Gravity, Urine: 1.015 (ref 1.000–1.030)
Total Protein, Urine: NEGATIVE
Urine Glucose: NEGATIVE
Urobilinogen, UA: 0.2 (ref 0.0–1.0)
WBC, UA: NONE SEEN (ref 0–?)
pH: 5.5 (ref 5.0–8.0)

## 2015-01-22 NOTE — Assessment & Plan Note (Signed)
stable overall by history and exam, recent data reviewed with pt, and pt to continue medical treatment as before,  to f/u any worsening symptoms or concerns BP Readings from Last 3 Encounters:  01/22/15 106/60  01/01/15 128/71  12/04/14 134/59

## 2015-01-22 NOTE — Patient Instructions (Addendum)

## 2015-01-22 NOTE — Progress Notes (Signed)
Pre visit review using our clinic review tool, if applicable. No additional management support is needed unless otherwise documented below in the visit note. 

## 2015-01-22 NOTE — Assessment & Plan Note (Signed)
stable overall by history and exam, recent data reviewed with pt, and pt to continue medical treatment as before,  to f/u any worsening symptoms or concerns SpO2 Readings from Last 3 Encounters:  01/22/15 97%  01/01/15 95%  12/04/14 95%

## 2015-01-22 NOTE — Assessment & Plan Note (Signed)
stable overall by history and exam, recent data reviewed with pt, and pt to continue medical treatment as before,  to f/u any worsening symptoms or concerns Lab Results  Component Value Date   LDLCALC 83 01/15/2014   For f/u labs today

## 2015-01-22 NOTE — Progress Notes (Signed)
Subjective:    Patient ID: Kristopher [name], male    DOB: 01-08-1931, 81 y.o.   MRN: [id number]  HPI  Here for yearly f/u;  Overall doing ok;  Pt denies Chest pain, worsening SOB, DOE, wheezing, orthopnea, PND, worsening LE edema, palpitations, dizziness or syncope.  Pt denies neurological change such as new headache, facial or extremity weakness.  Pt denies polydipsia, polyuria, or low sugar symptoms. Pt states overall good compliance with treatment and medications, good tolerability, and has been trying to follow appropriate diet.  Pt denies worsening depressive symptoms, suicidal ideation or panic. No fever, night sweats, wt loss, loss of appetite, or other constitutional symptoms.  Pt states good ability with ADL's, has low to mod fall risk, home safety reviewed and adequate, no other significant changes in hearing or vision, and only occasionally active with exercise, due to ongoing right knee and lower back pain, followed per pain clinic Past Medical History  Diagnosis Date  . HTN (hypertension)   . CAD (coronary artery disease)     s/p PCI of LAD 1998;  s/p DES to RCA 05/2009;  Myoview 4/13: low risk, no ischemia, EF 56%  . Aortic stenosis     echo 4/12: normal LVF, mild LAE, no significant AS  . HLD (hyperlipidemia)   . PAD (peripheral artery disease) ([place])     s/p L CIA stent 09/2011 ([facility])  . Renal artery stenosis ([facility])     s/p bilat RA stents  . Esophageal reflux   . DJD (degenerative joint disease), lumbar 07/06/2011  . Urine incontinence   . Acute myocardial infarction, unspecified site, episode of care unspecified 1998  . COPD (chronic obstructive pulmonary disease) ([place])   . AAA (abdominal aortic aneurysm) ([place])     ultrasound 07/2011:  2.7 x 2.8 cm   Past Surgical History  Procedure Laterality Date  . Pci of lad  05/1996    STENT X 4 TOTAL  . Hemorrhoid surgery  1954  . Cataract extraction w/ intraocular lens  implant, bilateral  YRS AGO  . Vasectomy  1968  . Transurethral  resection of prostate  08/11/2011    Procedure: TRANSURETHRAL RESECTION OF THE PROSTATE WITH GYRUS INSTRUMENTS;  Surgeon: Fredricka Bonine, MD;  Location: [facility];  Service: Urology;  Laterality: N/A;     . Coronary angioplasty with stent placement    . Lower extremity angiogram Bilateral 09/20/2011    Procedure: LOWER EXTREMITY ANGIOGRAM;  Surgeon: Wellington Hampshire, MD;  Location: [place] [facility];  Service: Cardiovascular;  Laterality: Bilateral;    reports that he quit smoking about 26 years ago. He has never used smokeless tobacco. He reports that he does not drink alcohol or use illicit drugs. family history includes Heart disease in his father and mother. Allergies  Allergen Reactions  . Influenza Vaccines Other (See Comments)    unknown  . Sulfa Antibiotics Rash   Current Outpatient Prescriptions on File Prior to Visit  Medication Sig Dispense Refill  . Ascorbic Acid (VITAMIN C) 500 MG tablet Take 500 mg by mouth 2 (two) times daily.     Marland Kitchen aspirin 325 MG EC tablet Take 325 mg by mouth daily.    Marland Kitchen atorvastatin (LIPITOR) 80 MG tablet Take 1 tablet (80 mg total) by mouth daily. 90 tablet 3  . BEE POLLEN PO Take by mouth.    Marland Kitchen Co-Enzyme Q-10 100 MG CAPS Take 1 capsule by mouth daily.    . diclofenac sodium (VOLTAREN) 1 %  GEL Apply 2 g topically 4 (four) times daily. 9 Tube 1  . fish oil-omega-3 fatty acids 1000 MG capsule Take 1 g by mouth daily.     Marland Kitchen HYDROcodone-acetaminophen (NORCO/VICODIN) 5-325 MG per tablet Take 1 tablet by mouth 3 (three) times daily before meals. 90 tablet 0  . Multiple Vitamin (MULTIVITAMIN) capsule Take 1 capsule by mouth daily.     . Multiple Vitamins-Minerals (PRESERVISION AREDS PO) Take 2 tablets by mouth daily.    . nitroGLYCERIN (NITROSTAT) 0.4 MG SL tablet Place 1 tablet (0.4 mg total) under the tongue every 5 (five) minutes as needed for chest pain. 90 tablet 3  . omeprazole (PRILOSEC) 20 MG capsule TAKE 2 CAPSULES DAILY 180 capsule 0  . ramipril  (ALTACE) 10 MG capsule Take 1 capsule (10 mg total) by mouth daily. 90 capsule 3   No current facility-administered medications on file prior to visit.   Review of Systems  Constitutional: Negative for increased diaphoresis, other activity, appetite or siginficant weight change other than noted HENT: Negative for worsening hearing loss, ear pain, facial swelling, mouth sores and neck stiffness.   Eyes: Negative for other worsening pain, redness or visual disturbance.  Respiratory: Negative for shortness of breath and wheezing  Cardiovascular: Negative for chest pain and palpitations.  Gastrointestinal: Negative for diarrhea, blood in stool, abdominal distention or other pain Genitourinary: Negative for hematuria, flank pain or change in urine volume.  Musculoskeletal: Negative for myalgias or other joint complaints.  Skin: Negative for color change and wound or drainage.  Neurological: Negative for syncope and numbness. other than noted Hematological: Negative for adenopathy. or other swelling Psychiatric/Behavioral: Negative for hallucinations, SI, self-injury, decreased concentration or other worsening agitation.      Objective:   Physical Exam BP 106/60 mmHg  Pulse 67  Temp(Src) 97.5 F (36.4 C) (Oral)  Ht 5\' 9"  (1.753 m)  Wt 152 lb (68.947 kg)  BMI 22.44 kg/m2  SpO2 97% Walks with cane, not ill appearing VS noted,  Constitutional: Pt is oriented to person, place, and time. Appears well-developed and well-nourished, in no significant distress Head: Normocephalic and atraumatic.  Right Ear: External ear normal.  Left Ear: External ear normal.  Nose: Nose normal.  Mouth/Throat: Oropharynx is clear and moist.  Eyes: Conjunctivae and EOM are normal. Pupils are equal, round, and reactive to light.  Neck: Normal range of motion. Neck supple. No JVD present. No tracheal deviation present or significant neck LA or mass Cardiovascular: Normal rate, regular rhythm, normal heart sounds  and intact distal pulses.   Pulmonary/Chest: Effort normal and breath sounds without rales or wheezing  Abdominal: Soft. Bowel sounds are normal. NT. No HSM  Musculoskeletal: Normal range of motion. Exhibits no edema.  Lymphadenopathy:  Has no cervical adenopathy.  Neurological: Pt is alert and oriented to person, place, and time. Pt has normal reflexes. No cranial nerve deficit. Motor grossly intact, no significant memory loss Skin: Skin is warm and dry. No rash noted.  Psychiatric:  Has normal mood and affect. Behavior is normal.     Assessment & Plan:

## 2015-02-01 ENCOUNTER — Encounter: Payer: Medicare Other | Attending: Registered Nurse | Admitting: Registered Nurse

## 2015-02-01 ENCOUNTER — Other Ambulatory Visit: Payer: Self-pay | Admitting: Physical Medicine & Rehabilitation

## 2015-02-01 ENCOUNTER — Encounter: Payer: Self-pay | Admitting: Registered Nurse

## 2015-02-01 VITALS — BP 123/53 | HR 60

## 2015-02-01 DIAGNOSIS — M222X1 Patellofemoral disorders, right knee: Secondary | ICD-10-CM

## 2015-02-01 DIAGNOSIS — M25561 Pain in right knee: Secondary | ICD-10-CM

## 2015-02-01 DIAGNOSIS — Z5181 Encounter for therapeutic drug level monitoring: Secondary | ICD-10-CM | POA: Diagnosis not present

## 2015-02-01 DIAGNOSIS — I701 Atherosclerosis of renal artery: Secondary | ICD-10-CM

## 2015-02-01 DIAGNOSIS — Z79899 Other long term (current) drug therapy: Secondary | ICD-10-CM | POA: Diagnosis not present

## 2015-02-01 DIAGNOSIS — M1611 Unilateral primary osteoarthritis, right hip: Secondary | ICD-10-CM | POA: Diagnosis not present

## 2015-02-01 DIAGNOSIS — M1711 Unilateral primary osteoarthritis, right knee: Secondary | ICD-10-CM | POA: Insufficient documentation

## 2015-02-01 DIAGNOSIS — G894 Chronic pain syndrome: Secondary | ICD-10-CM | POA: Insufficient documentation

## 2015-02-01 MED ORDER — HYDROCODONE-ACETAMINOPHEN 5-325 MG PO TABS: 1.0000 | ORAL_TABLET | Freq: Three times a day (TID) | ORAL | Status: DC

## 2015-02-01 NOTE — Progress Notes (Signed)
Subjective:    Patient ID: Kristopher [name], male    DOB: October 02, 1930, 81 y.o.   MRN: [id number]  HPI: [name] is a 81 year old male who returns for follow up for chronic pain and medication refill. He says his pain is located in his right knee. He rates his pain 4. His current exercise regime is walking and yard work.  Pain Inventory Average Pain 3 Pain Right Now 4 My pain is n/a  In the last 24 hours, has pain interfered with the following? General activity n/a Relation with others 0 Enjoyment of life 0 What TIME of day is your pain at its worst? na Sleep (in general) NA  Pain is worse with: na Pain improves with: na Relief from Meds: na  Mobility use a cane  Function retired  Neuro/Psych No problems in this area  Prior Studies Any changes since last visit?  no  Physicians involved in your care Any changes since last visit?  no   Family History  Problem Relation Age of Onset  . Heart disease Mother   . Heart disease Father    Social History   Social History  . Marital Status: Widowed    Spouse Name: N/A  . Number of Children: N/A  . Years of Education: GED   Occupational History  . retired    Social History Main Topics  . Smoking status: Former Smoker -- 3.00 packs/day for 38 years    Quit date: 04/10/1988  . Smokeless tobacco: Never Used  . Alcohol Use: No  . Drug Use: No  . Sexual Activity: Not Asked   Other Topics Concern  . None   Social History Narrative   Past Surgical History  Procedure Laterality Date  . Pci of lad  05/1996    STENT X 4 TOTAL  . Hemorrhoid surgery  1954  . Cataract extraction w/ intraocular lens  implant, bilateral  YRS AGO  . Vasectomy  1968  . Transurethral resection of prostate  08/11/2011    Procedure: TRANSURETHRAL RESECTION OF THE PROSTATE WITH GYRUS INSTRUMENTS;  Surgeon: Fredricka Bonine, MD;  Location: [facility];  Service: Urology;  Laterality: N/A;     . Coronary angioplasty with stent  placement    . Lower extremity angiogram Bilateral 09/20/2011    Procedure: LOWER EXTREMITY ANGIOGRAM;  Surgeon: Wellington Hampshire, MD;  Location: [place] [facility];  Service: Cardiovascular;  Laterality: Bilateral;   Past Medical History  Diagnosis Date  . HTN (hypertension)   . CAD (coronary artery disease)     s/p PCI of LAD 1998;  s/p DES to RCA 05/2009;  Myoview 4/13: low risk, no ischemia, EF 56%  . Aortic stenosis     echo 4/12: normal LVF, mild LAE, no significant AS  . HLD (hyperlipidemia)   . PAD (peripheral artery disease) ([place])     s/p L CIA stent 09/2011 ([facility])  . Renal artery stenosis ([facility])     s/p bilat RA stents  . Esophageal reflux   . DJD (degenerative joint disease), lumbar 07/06/2011  . Urine incontinence   . Acute myocardial infarction, unspecified site, episode of care unspecified 1998  . COPD (chronic obstructive pulmonary disease) ([place])   . AAA (abdominal aortic aneurysm) ([place])     ultrasound 07/2011:  2.7 x 2.8 cm   BP 123/53 mmHg  Pulse 48  SpO2 96%  Opioid Risk Score:   Fall Risk Score:  `1  Depression screen [facility] 2/9  Depression screen [facility] 2/9 01/22/2015 01/01/2015 12/04/2014 11/03/2014 07/02/2014 01/15/2014 07/16/2013  Decreased Interest 0 0 0 0 0 0 0  Down, Depressed, Hopeless 0 0 0 0 0 0 0  PHQ - 2 Score 0 0 0 0 0 0 0  Altered sleeping - - - - 0 - -  Tired, decreased energy - - - - 1 - -  Change in appetite - - - - 1 - -  Feeling bad or failure about yourself  - - - - 0 - -  Trouble concentrating - - - - 0 - -  Moving slowly or fidgety/restless - - - - 0 - -  Suicidal thoughts - - - - 0 - -  PHQ-9 Score - - - - 2 - -      Review of Systems  All other systems reviewed and are negative.      Objective:   Physical Exam  Constitutional: He is oriented to person, place, and time. He appears well-developed and well-nourished.  HENT:  Head: Normocephalic and atraumatic.  Neck: Normal range of motion. Neck supple.  Cardiovascular: Normal rate and  regular rhythm.   Pulmonary/Chest: Effort normal and breath sounds normal.  Musculoskeletal:  Normal Muscle Bulk and Muscle Testing Reveals: Upper Extremities: Full ROM and Muscle Strength 5/5 Lower Extremities: Full ROM and Muscle Strength 5/5 Right Lower Extremity Flexion Produces Pain into Patella Arises from chair slowly using 4 prong cane Narrow Based Gait  Neurological: He is alert and oriented to person, place, and time.  Skin: Skin is warm and dry.  Psychiatric: He has a normal mood and affect.  Nursing note and vitals reviewed.         Assessment & Plan:  1. Patellofemoral arthritis. Continue Voltaren gel, and continue with exercise regimen.  2 Chronic Low Back Pain/ Lumbar Stenosis with radiculopathy:Refilled: Hydrocodone 5/325 mg one tablet three times a day #90.  20 minutes of face to face patient care time was spent during this visit. All questions were encouraged and answered.  F/U in 1 month

## 2015-02-02 LAB — PMP ALCOHOL METABOLITE (ETG): Ethyl Glucuronide (EtG): NEGATIVE ng/mL

## 2015-02-04 LAB — OPIATES/OPIOIDS (LC/MS-MS)
Codeine Urine: NEGATIVE ng/mL (ref <50)
Hydrocodone: 1879 ng/mL (ref <50)
Hydromorphone: 368 ng/mL (ref <50)
Morphine Urine: NEGATIVE ng/mL (ref <50)
Norhydrocodone, Ur: 2256 ng/mL (ref <50)
Noroxycodone, Ur: NEGATIVE ng/mL (ref <50)
Oxycodone, ur: NEGATIVE ng/mL (ref <50)
Oxymorphone: NEGATIVE ng/mL (ref <50)

## 2015-02-05 LAB — PRESCRIPTION MONITORING PROFILE (SOLSTAS)
Amphetamine/Meth: NEGATIVE ng/mL
Barbiturate Screen, Urine: NEGATIVE ng/mL
Benzodiazepine Screen, Urine: NEGATIVE ng/mL
Buprenorphine, Urine: NEGATIVE ng/mL
Cannabinoid Scrn, Ur: NEGATIVE ng/mL
Carisoprodol, Urine: NEGATIVE ng/mL
Cocaine Metabolites: NEGATIVE ng/mL
Creatinine, Urine: 244.87 mg/dL (ref >20.0)
Fentanyl, Ur: NEGATIVE ng/mL
MDMA URINE: NEGATIVE ng/mL
Meperidine, Ur: NEGATIVE ng/mL
Methadone Screen, Urine: NEGATIVE ng/mL
Nitrites, Initial: NEGATIVE ug/mL
Oxycodone Screen, Ur: NEGATIVE ng/mL
Propoxyphene: NEGATIVE ng/mL
Tapentadol, urine: NEGATIVE ng/mL
Tramadol Scrn, Ur: NEGATIVE ng/mL
Zolpidem, Urine: NEGATIVE ng/mL
pH, Initial: 5.1 pH (ref 4.5–8.9)

## 2015-02-05 IMAGING — CT CT L SPINE W/O CM
3 of 5 series · 11 of 33 positions shown, 12 images · non-contrast
Comparison: CT abdomen pelvis 09/01/2013

CLINICAL DATA: Low back and RLE pain,

EXAM:
CT LUMBAR SPINE WITHOUT CONTRAST
TECHNIQUE: Multidetector CT imaging of the lumbar spine was performed without
intravenous contrast administration. Multiplanar CT image
reconstructions were also generated.

[Series 4: l spine 2.0 i30s 3 · axial · 0.32mm/px · z∈[+530,+692]mm · 4 of 117 slices shown, 5 images]
[im 18/117  soft-tissue]
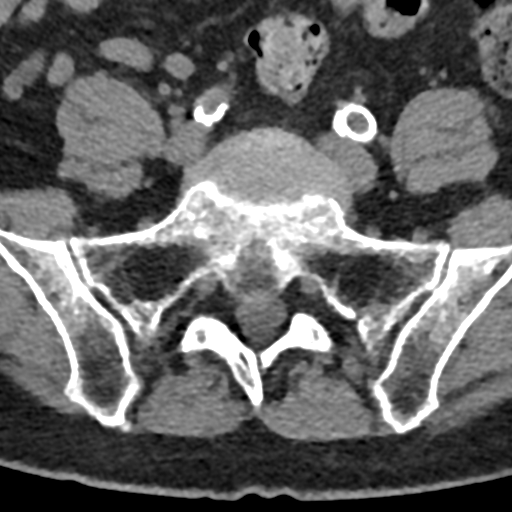
[im 18/117  bone]
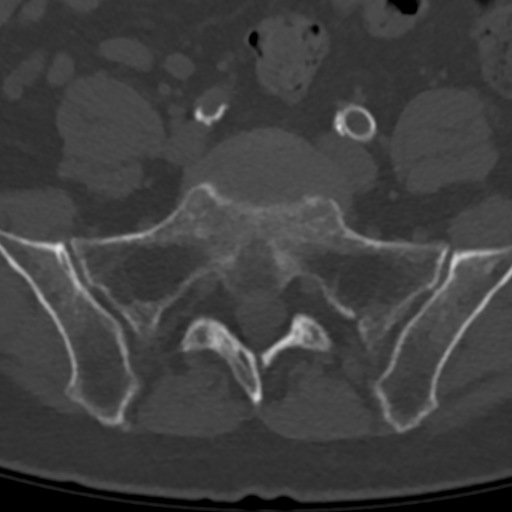
[im 45/117  bone]
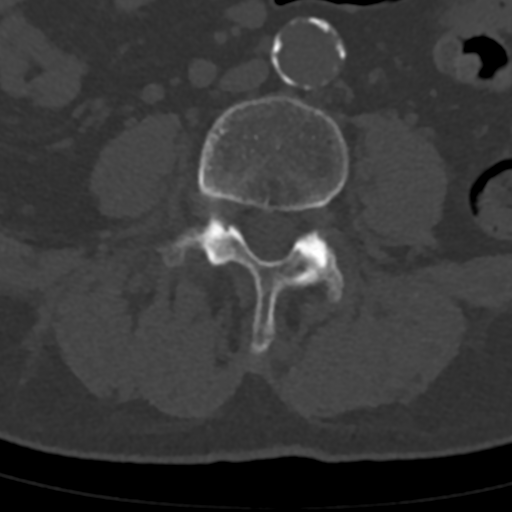
[im 72/117  bone]
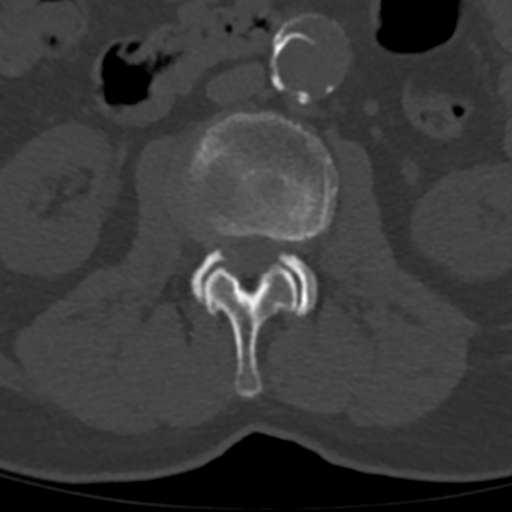
[im 99/117  bone]
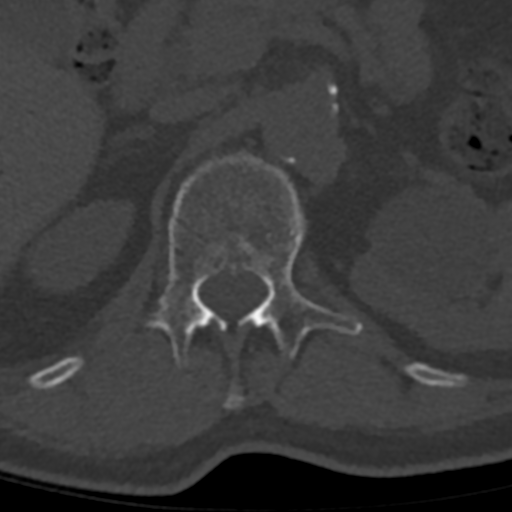

[Series 5: coronal bone · coronal · 0.23mm/px · 2 of 65 slices shown]
[im 22/65  bone]
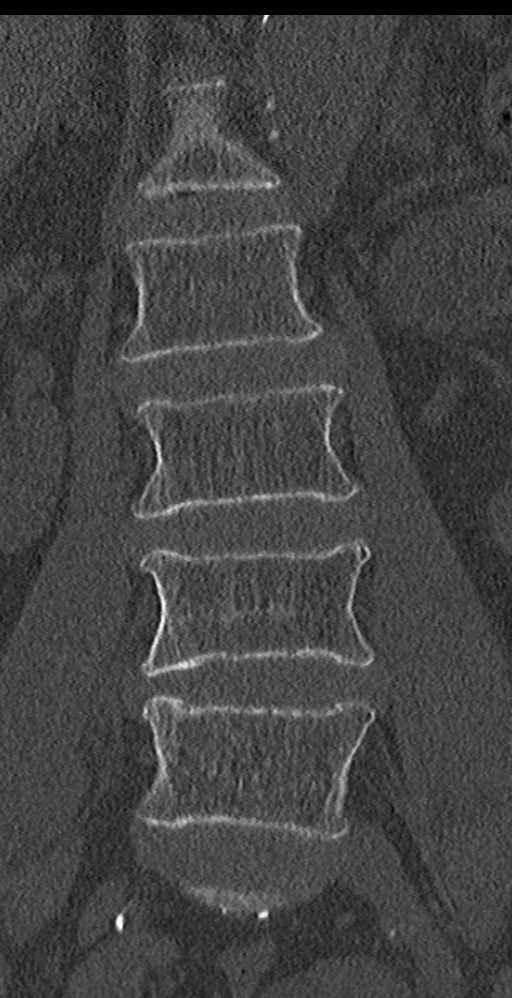
[im 43/65  bone]
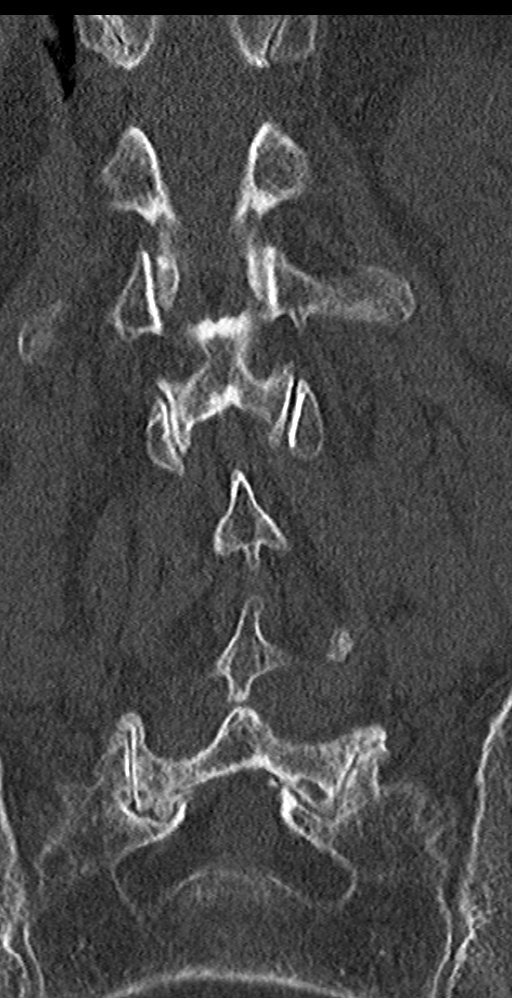

[Series 8: sagittal st · sagittal · 0.23mm/px · 5 of 59 slices shown]
[im 10/59  bone]
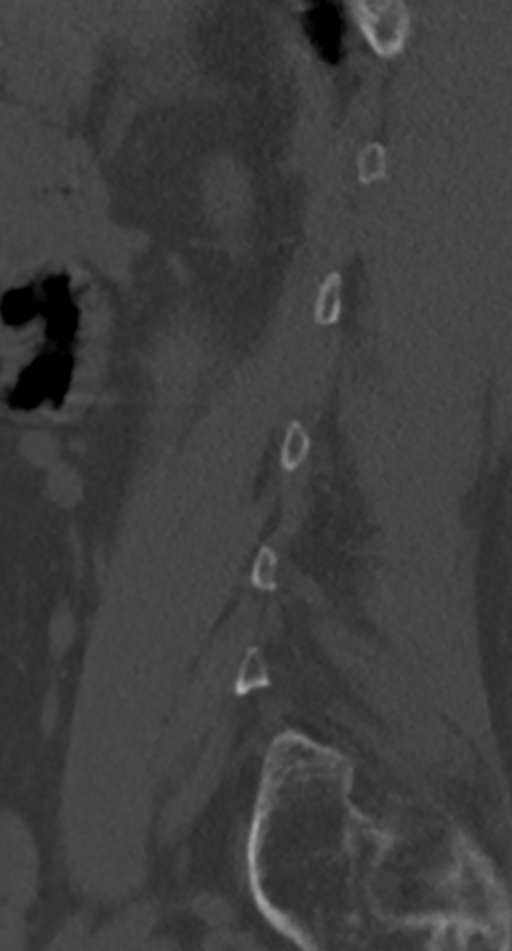
[im 20/59  bone]
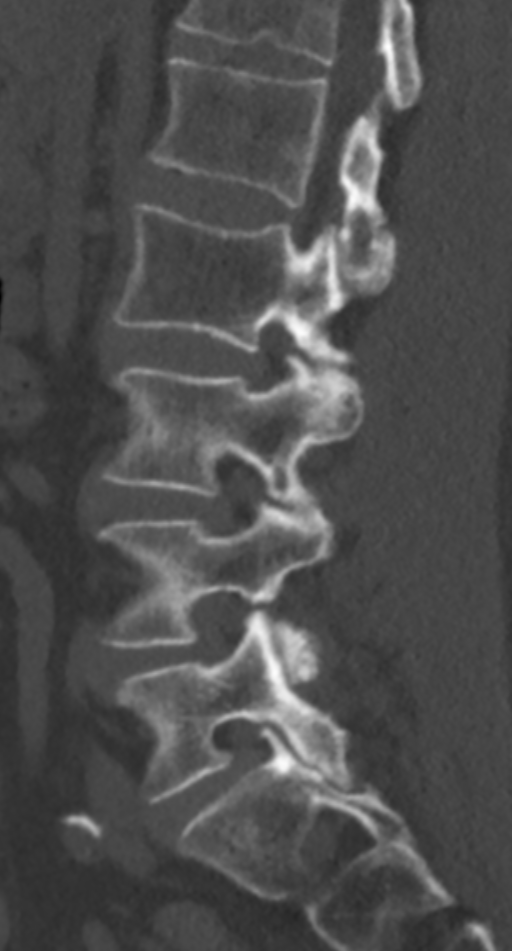
[im 30/59  bone]
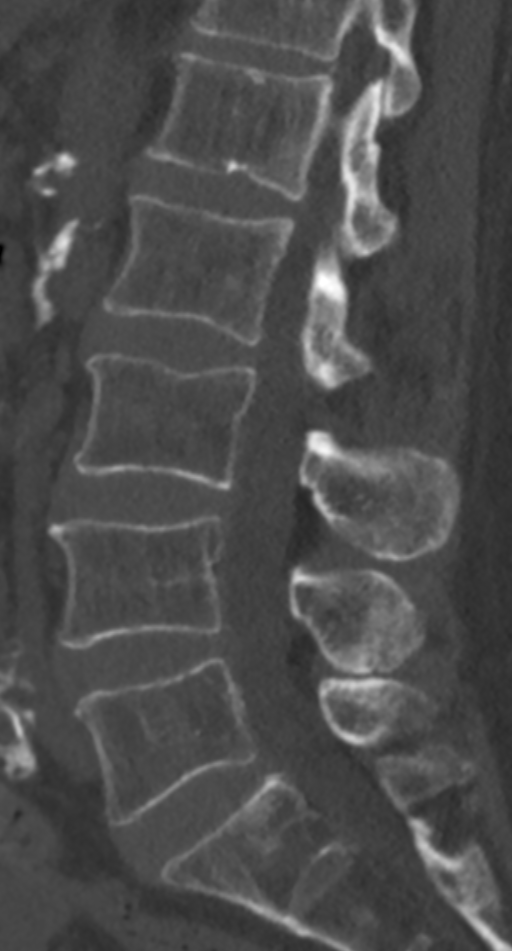
[im 39/59  bone]
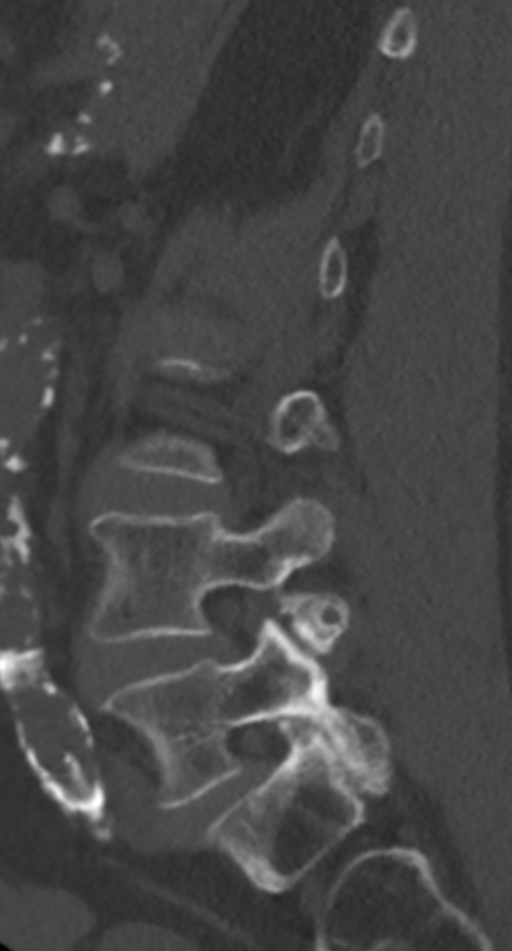
[im 49/59  bone]
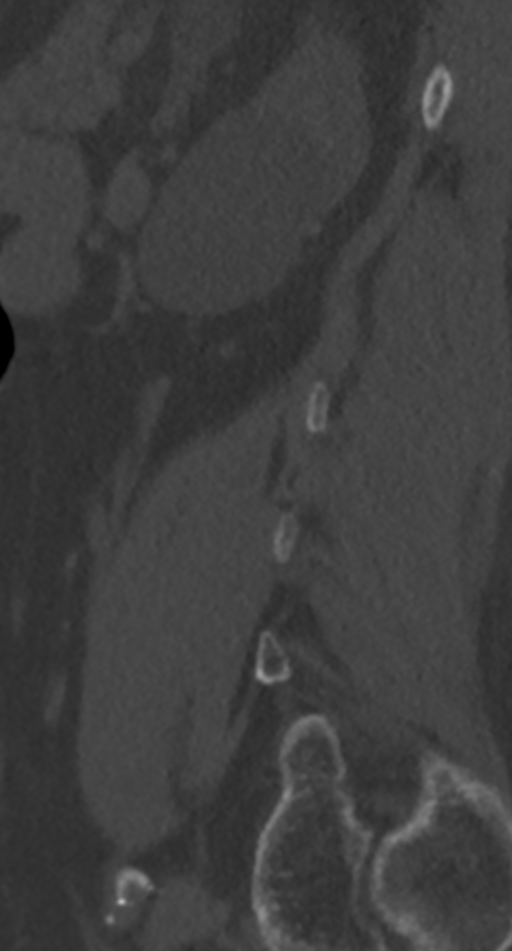

[11 of 33 positions shown; findings below may reference images not displayed]

FINDINGS: No fracture nor dislocation. Schmorl's nodes, mild, superior and
inferior endplates of L1.

The disc space heights are maintained within the lumbar spine. There
is disc space narrowing at T12-L1.

At the L1-2 level, a mild broad-based disc bulge appreciated causing
partial effacement of the anterior CSF space. No evidence of neural
foraminal narrowing.

At the L2-3 level a broad-based disc bulge is appreciated
demonstrating lateralization to the right and left. Periarticular
facet sclerosis and hypertrophy. Multifactorial mild canal stenosis
is appreciated.

At the L3-4 level a broad-based disc bulge is appreciated causing
partial effacement of the anterior CSF space. There is mild facet
sclerosis and hypertrophy. Mild multifactorial canal stenosis is
appreciated. There is evidence of mild neural foraminal narrowing on
the left secondary to hypertrophic spurring of the L4 facet.

At the L4-5 level a focal central disc protrusion is identified
resulting in encroachment upon the anterior CSF space. The lateral
aspects of the canal are narrowed secondary to facet hypertrophy and
sclerosis as well as ligamentum flavum hypertrophy. There is no
evidence of neural foraminal narrowing secondary to bony spurring.
There is moderate multifactorial canal narrowing.

At the L5-S1 level. A broad-based disc bulge is appreciated
demonstrating lateralization to the right than left. Mild canal
narrowing is appreciated at this level. There is facet hypertrophy
and sclerosis. Ligamentum flavum hypertrophy also appreciated. Mild
neuroforaminal narrowing secondary to bony spurring on the right.
The neural foramen on the left appears patent.

Atherosclerotic changes within the aorta and iliac vessels.

5 cm cyst left kidney.
IMPRESSION: Areas of multifactorial spondylosis most prominent at the L4-5
level. Further evaluation with lumbar MRI is recommended for further
characterization.

## 2015-02-18 NOTE — Progress Notes (Signed)
Urine drug screen for this encounter is consistent for prescribed medication 

## 2015-02-26 ENCOUNTER — Ambulatory Visit (HOSPITAL_BASED_OUTPATIENT_CLINIC_OR_DEPARTMENT_OTHER): Payer: Medicare Other | Admitting: Physical Medicine & Rehabilitation

## 2015-02-26 ENCOUNTER — Encounter: Payer: Self-pay | Admitting: Physical Medicine & Rehabilitation

## 2015-02-26 ENCOUNTER — Encounter: Payer: Medicare Other | Attending: Registered Nurse

## 2015-02-26 VITALS — BP 139/34 | HR 52

## 2015-02-26 DIAGNOSIS — Z5181 Encounter for therapeutic drug level monitoring: Secondary | ICD-10-CM | POA: Insufficient documentation

## 2015-02-26 DIAGNOSIS — M1611 Unilateral primary osteoarthritis, right hip: Secondary | ICD-10-CM | POA: Insufficient documentation

## 2015-02-26 DIAGNOSIS — M25561 Pain in right knee: Secondary | ICD-10-CM

## 2015-02-26 DIAGNOSIS — M222X1 Patellofemoral disorders, right knee: Secondary | ICD-10-CM

## 2015-02-26 DIAGNOSIS — M1711 Unilateral primary osteoarthritis, right knee: Secondary | ICD-10-CM | POA: Diagnosis not present

## 2015-02-26 DIAGNOSIS — Z79899 Other long term (current) drug therapy: Secondary | ICD-10-CM | POA: Diagnosis not present

## 2015-02-26 DIAGNOSIS — G894 Chronic pain syndrome: Secondary | ICD-10-CM | POA: Diagnosis not present

## 2015-02-26 MED ORDER — HYDROCODONE-ACETAMINOPHEN 5-325 MG PO TABS: 1.0000 | ORAL_TABLET | Freq: Three times a day (TID) | ORAL | Status: DC

## 2015-02-26 NOTE — Progress Notes (Signed)
Knee injection Right   Indication:Right Knee pain not relieved by medication management and other conservative care.  Informed consent was obtained after describing risks and benefits of the procedure with the patient, this includes bleeding, bruising, infection and medication side effects. The patient wishes to proceed and has given written consent. The patient was placed in a recumbent position. The medial aspect of the knee was marked and prepped with Betadine and alcohol. It was then entered with a 25-gauge 1-1/2 inch needle  inserted into the knee joint. After negative draw back for blood, a solution containing one ML of 6mg  per mL betamethasone and 3 mL of 1% lidocaine were injected. The patient tolerated the procedure well. Post procedure instructions were given.

## 2015-03-01 ENCOUNTER — Ambulatory Visit: Payer: Medicare Other | Admitting: Physical Medicine & Rehabilitation

## 2015-04-04 ENCOUNTER — Other Ambulatory Visit: Payer: Self-pay | Admitting: Internal Medicine

## 2015-04-06 ENCOUNTER — Encounter: Payer: Self-pay | Admitting: Registered Nurse

## 2015-04-06 ENCOUNTER — Encounter: Payer: Medicare Other | Attending: Registered Nurse | Admitting: Registered Nurse

## 2015-04-06 VITALS — BP 92/54 | HR 67

## 2015-04-06 DIAGNOSIS — Z79899 Other long term (current) drug therapy: Secondary | ICD-10-CM | POA: Insufficient documentation

## 2015-04-06 DIAGNOSIS — M1611 Unilateral primary osteoarthritis, right hip: Secondary | ICD-10-CM | POA: Insufficient documentation

## 2015-04-06 DIAGNOSIS — I701 Atherosclerosis of renal artery: Secondary | ICD-10-CM | POA: Diagnosis not present

## 2015-04-06 DIAGNOSIS — M222X1 Patellofemoral disorders, right knee: Secondary | ICD-10-CM

## 2015-04-06 DIAGNOSIS — G894 Chronic pain syndrome: Secondary | ICD-10-CM | POA: Diagnosis not present

## 2015-04-06 DIAGNOSIS — M25561 Pain in right knee: Secondary | ICD-10-CM

## 2015-04-06 DIAGNOSIS — M1711 Unilateral primary osteoarthritis, right knee: Secondary | ICD-10-CM | POA: Diagnosis not present

## 2015-04-06 DIAGNOSIS — Z5181 Encounter for therapeutic drug level monitoring: Secondary | ICD-10-CM

## 2015-04-06 MED ORDER — HYDROCODONE-ACETAMINOPHEN 5-325 MG PO TABS: 1.0000 | ORAL_TABLET | Freq: Three times a day (TID) | ORAL | Status: DC

## 2015-04-06 NOTE — Progress Notes (Signed)
Subjective:    Patient ID: Kristopher [name], male    DOB: 08-25-1930, 81 y.o.   MRN: [id number]  HPI: Kristopher [name] is a 81 year old male who returns for follow up for chronic pain and medication refill. He says his pain is located in his right knee. He rates his pain 3. His current exercise regime is walking, performing stretching exercises and yard work ( raking leaves).  Pain Inventory Average Pain 3 Pain Right Now 3 My pain is aching  In the last 24 hours, has pain interfered with the following? General activity 0 Relation with others 0 Enjoyment of life 0 What TIME of day is your pain at its worst? varies Sleep (in general) NA  Pain is worse with: some activites Pain improves with: medication Relief from Meds: 6  Mobility use a cane  Function retired  Neuro/Psych No problems in this area  Prior Studies Any changes since last visit?  no  Physicians involved in your care Any changes since last visit?  no   Family History  Problem Relation Age of Onset  . Heart disease Mother   . Heart disease Father    Social History   Social History  . Marital Status: Widowed    Spouse Name: N/A  . Number of Children: N/A  . Years of Education: GED   Occupational History  . retired    Social History Main Topics  . Smoking status: Former Smoker -- 3.00 packs/day for 38 years    Quit date: 04/10/1988  . Smokeless tobacco: Never Used  . Alcohol Use: No  . Drug Use: No  . Sexual Activity: Not Asked   Other Topics Concern  . None   Social History Narrative   Past Surgical History  Procedure Laterality Date  . Pci of lad  05/1996    STENT X 4 TOTAL  . Hemorrhoid surgery  1954  . Cataract extraction w/ intraocular lens  implant, bilateral  YRS AGO  . Vasectomy  1968  . Transurethral resection of prostate  08/11/2011    Procedure: TRANSURETHRAL RESECTION OF THE PROSTATE WITH GYRUS INSTRUMENTS;  Surgeon: Fredricka Bonine, MD;  Location: [facility];  Service:  Urology;  Laterality: N/A;     . Coronary angioplasty with stent placement    . Lower extremity angiogram Bilateral 09/20/2011    Procedure: LOWER EXTREMITY ANGIOGRAM;  Surgeon: Wellington Hampshire, MD;  Location: [facility];  Service: Cardiovascular;  Laterality: Bilateral;   Past Medical History  Diagnosis Date  . HTN (hypertension)   . CAD (coronary artery disease)     s/p PCI of LAD 1998;  s/p DES to RCA 05/2009;  Myoview 4/13: low risk, no ischemia, EF 56%  . Aortic stenosis     echo 4/12: normal LVF, mild LAE, no significant AS  . HLD (hyperlipidemia)   . PAD (peripheral artery disease) ([place])     s/p L CIA stent 09/2011 ([facility])  . Renal artery stenosis ([facility])     s/p bilat RA stents  . Esophageal reflux   . DJD (degenerative joint disease), lumbar 07/06/2011  . Urine incontinence   . Acute myocardial infarction, unspecified site, episode of care unspecified 1998  . COPD (chronic obstructive pulmonary disease) ([place])   . AAA (abdominal aortic aneurysm) ([place])     ultrasound 07/2011:  2.7 x 2.8 cm   BP 92/54 mmHg  Pulse 67  SpO2 97%  Opioid Risk Score:   Fall Risk Score:  `  1  Depression screen PHQ 2/9  Depression screen [facility] 2/9 01/22/2015 01/01/2015 12/04/2014 11/03/2014 07/02/2014 01/15/2014 07/16/2013  Decreased Interest 0 0 0 0 0 0 0  Down, Depressed, Hopeless 0 0 0 0 0 0 0  PHQ - 2 Score 0 0 0 0 0 0 0  Altered sleeping - - - - 0 - -  Tired, decreased energy - - - - 1 - -  Change in appetite - - - - 1 - -  Feeling bad or failure about yourself  - - - - 0 - -  Trouble concentrating - - - - 0 - -  Moving slowly or fidgety/restless - - - - 0 - -  Suicidal thoughts - - - - 0 - -  PHQ-9 Score - - - - 2 - -     Review of Systems     Objective:   Physical Exam  Constitutional: He is oriented to person, place, and time. He appears well-developed and well-nourished.  HENT:  Head: Normocephalic and atraumatic.  Neck: Normal range of motion. Neck supple.  Cardiovascular: Normal  rate and regular rhythm.   Pulmonary/Chest: Effort normal and breath sounds normal.  Musculoskeletal:  Normal Muscle Bulk and Muscle Testing Reveals: Upper Extremities: Full ROM and Muscle Strength 5/5 Lower Extremities: Full ROM and Muscle Strength 5/5 Right Lower Extremity Flexion Produces Pain into Patella Arises from chair slowly/ Using 4 prong cane for support Antalgic Gait   Neurological: He is alert and oriented to person, place, and time.  Skin: Skin is warm and dry.  Psychiatric: He has a normal mood and affect.  Nursing note and vitals reviewed.         Assessment & Plan:  1. Patellofemoral arthritis. Continue Voltaren gel, and continue with exercise regimen.  2 Chronic Low Back Pain/ Lumbar Stenosis with radiculopathy:Refilled: Hydrocodone 5/325 mg one tablet three times a day #90.  20 minutes of face to face patient care time was spent during this visit. All questions were encouraged and answered.  F/U in 1 month

## 2015-05-07 ENCOUNTER — Encounter: Payer: Medicare Other | Attending: Registered Nurse | Admitting: Registered Nurse

## 2015-05-07 ENCOUNTER — Encounter: Payer: Self-pay | Admitting: Registered Nurse

## 2015-05-07 VITALS — BP 123/68 | HR 64 | Resp 14

## 2015-05-07 DIAGNOSIS — G894 Chronic pain syndrome: Secondary | ICD-10-CM | POA: Diagnosis not present

## 2015-05-07 DIAGNOSIS — M1711 Unilateral primary osteoarthritis, right knee: Secondary | ICD-10-CM | POA: Diagnosis not present

## 2015-05-07 DIAGNOSIS — Z79899 Other long term (current) drug therapy: Secondary | ICD-10-CM

## 2015-05-07 DIAGNOSIS — M222X1 Patellofemoral disorders, right knee: Secondary | ICD-10-CM

## 2015-05-07 DIAGNOSIS — Z5181 Encounter for therapeutic drug level monitoring: Secondary | ICD-10-CM | POA: Diagnosis not present

## 2015-05-07 DIAGNOSIS — M1611 Unilateral primary osteoarthritis, right hip: Secondary | ICD-10-CM | POA: Diagnosis not present

## 2015-05-07 DIAGNOSIS — M25561 Pain in right knee: Secondary | ICD-10-CM

## 2015-05-07 MED ORDER — HYDROCODONE-ACETAMINOPHEN 5-325 MG PO TABS: 1.0000 | ORAL_TABLET | Freq: Three times a day (TID) | ORAL | Status: DC

## 2015-05-07 NOTE — Progress Notes (Signed)
Subjective:    Patient ID: Kristopher [name], male    DOB: [birth year], 82 y.o.   MRN: [id number]  HPI: Kristopher [name] is a 82 year old male who returns for follow up for chronic pain and medication refill. He says his pain is located in his right knee. He rates his pain 3. His current exercise regime is walking and performing stretching exercises. Also states two weeks ago he was walking down the stairs in his home when the hand rail snapped. He landed on his buttocks, he was able to get up. He didn't seek medical attention.  Pain Inventory Average Pain 3 Pain Right Now 3 My pain is aching  In the last 24 hours, has pain interfered with the following? General activity 0 Relation with others 0 Enjoyment of life 0 What TIME of day is your pain at its worst? varies Sleep (in general) Good  Pain is worse with: some activites Pain improves with: medication Relief from Meds: 6  Mobility walk with assistance use a cane ability to climb steps?  yes do you drive?  yes  Function retired  Neuro/Psych No problems in this area  Prior Studies Any changes since last visit?  no  Physicians involved in your care Any changes since last visit?  no   Family History  Problem Relation Age of Onset  . Heart disease Mother   . Heart disease Father    Social History   Social History  . Marital Status: Widowed    Spouse Name: N/A  . Number of Children: N/A  . Years of Education: GED   Occupational History  . retired    Social History Main Topics  . Smoking status: Former Smoker -- 3.00 packs/day for 38 years    Quit date: 04/10/1988  . Smokeless tobacco: Never Used  . Alcohol Use: No  . Drug Use: No  . Sexual Activity: Not Asked   Other Topics Concern  . None   Social History Narrative   Past Surgical History  Procedure Laterality Date  . Pci of lad  05/1996    STENT X 4 TOTAL  . Hemorrhoid surgery  1954  . Cataract extraction w/ intraocular lens  implant,  bilateral  YRS AGO  . Vasectomy  1968  . Transurethral resection of prostate  08/11/2011    Procedure: TRANSURETHRAL RESECTION OF THE PROSTATE WITH GYRUS INSTRUMENTS;  Surgeon: Fredricka Bonine, MD;  Location: [facility];  Service: Urology;  Laterality: N/A;     . Coronary angioplasty with stent placement    . Lower extremity angiogram Bilateral 09/20/2011    Procedure: LOWER EXTREMITY ANGIOGRAM;  Surgeon: Wellington Hampshire, MD;  Location: [place] CATH [facility];  Service: Cardiovascular;  Laterality: Bilateral;   Past Medical History  Diagnosis Date  . HTN (hypertension)   . CAD (coronary artery disease)     s/p PCI of LAD 1998;  s/p DES to RCA 05/2009;  Myoview 4/13: low risk, no ischemia, EF 56%  . Aortic stenosis     echo 4/12: normal LVF, mild LAE, no significant AS  . HLD (hyperlipidemia)   . PAD (peripheral artery disease) ([place])     s/p L CIA stent 09/2011 ([place])  . Renal artery stenosis ([facility])     s/p bilat RA stents  . Esophageal reflux   . DJD (degenerative joint disease), lumbar 07/06/2011  . Urine incontinence   . Acute myocardial infarction, unspecified site, episode of care unspecified 1998  . COPD (  chronic obstructive pulmonary disease) ([place])   . AAA (abdominal aortic aneurysm) ([place])     ultrasound 07/2011:  2.7 x 2.8 cm   BP 123/68 mmHg  Pulse 64  Resp 14  SpO2 98%  Opioid Risk Score:   Fall Risk Score:  `1  Depression screen PHQ 2/9  Depression screen [facility] 2/9 01/22/2015 01/01/2015 12/04/2014 11/03/2014 07/02/2014 01/15/2014 07/16/2013  Decreased Interest 0 0 0 0 0 0 0  Down, Depressed, Hopeless 0 0 0 0 0 0 0  PHQ - 2 Score 0 0 0 0 0 0 0  Altered sleeping - - - - 0 - -  Tired, decreased energy - - - - 1 - -  Change in appetite - - - - 1 - -  Feeling bad or failure about yourself  - - - - 0 - -  Trouble concentrating - - - - 0 - -  Moving slowly or fidgety/restless - - - - 0 - -  Suicidal thoughts - - - - 0 - -  PHQ-9 Score - - - - 2 - -     Review of Systems  All other  systems reviewed and are negative.      Objective:   Physical Exam  Constitutional: He is oriented to person, place, and time. He appears well-developed and well-nourished.  HENT:  Head: Normocephalic and atraumatic.  Neck: Normal range of motion. Neck supple.  Cardiovascular: Normal rate and regular rhythm.   Pulmonary/Chest: Effort normal and breath sounds normal.  Musculoskeletal:  Normal Muscle Bulk and Muscle Testing Reveals: Upper Extremities: Full ROM and Muscle Strength 5/5 Lower Extremities: Right: Decreased ROM and Muscle Strength 5/5 Left: Full ROM and Muscle Strength 5/5 Right Lower Extremity Flexion Produces Pain into Patella Arises from chair slowly using 4 prong cane for support Antalgic Gait  Neurological: He is alert and oriented to person, place, and time.  Skin: Skin is warm and dry.  Psychiatric: He has a normal mood and affect.  Nursing note and vitals reviewed.         Assessment & Plan:  1. Patellofemoral arthritis. Continue Voltaren gel, and continue with exercise regimen.  2 Chronic Low Back Pain/ Lumbar Stenosis with radiculopathy:Refilled: Hydrocodone 5/325 mg one tablet three times a day #90.  20 minutes of face to face patient care time was spent during this visit. All questions were encouraged and answered.  F/U in 1 month

## 2015-06-07 ENCOUNTER — Encounter: Payer: Medicare Other | Attending: Registered Nurse | Admitting: Registered Nurse

## 2015-06-07 ENCOUNTER — Encounter: Payer: Self-pay | Admitting: Registered Nurse

## 2015-06-07 VITALS — BP 164/71 | HR 66

## 2015-06-07 DIAGNOSIS — Z5181 Encounter for therapeutic drug level monitoring: Secondary | ICD-10-CM | POA: Diagnosis not present

## 2015-06-07 DIAGNOSIS — Z79899 Other long term (current) drug therapy: Secondary | ICD-10-CM | POA: Insufficient documentation

## 2015-06-07 DIAGNOSIS — G894 Chronic pain syndrome: Secondary | ICD-10-CM | POA: Insufficient documentation

## 2015-06-07 DIAGNOSIS — M1611 Unilateral primary osteoarthritis, right hip: Secondary | ICD-10-CM | POA: Insufficient documentation

## 2015-06-07 DIAGNOSIS — M1711 Unilateral primary osteoarthritis, right knee: Secondary | ICD-10-CM | POA: Diagnosis not present

## 2015-06-07 MED ORDER — HYDROCODONE-ACETAMINOPHEN 5-325 MG PO TABS: 1.0000 | ORAL_TABLET | Freq: Three times a day (TID) | ORAL | Status: DC

## 2015-06-07 NOTE — Progress Notes (Signed)
Subjective:    Patient ID: Kristopher [name], male    DOB: 12/02/1930, 82 y.o.   MRN: [id number]  HPI: Kristopher [name] is a 82 year old male who returns for follow up for chronic pain and medication refill. He states his pain is located in his right knee. He rates his pain 3. His current exercise regime is walking and performing stretching exercises.  Pain Inventory Average Pain 3 Pain Right Now 3 My pain is constant  In the last 24 hours, has pain interfered with the following? General activity 0 Relation with others 0 Enjoyment of life 0 What TIME of day is your pain at its worst? varies Sleep (in general) NA  Pain is worse with: some activites Pain improves with: rest and medication Relief from Meds: 8  Mobility use a cane  Function retired  Neuro/Psych No problems in this area  Prior Studies Any changes since last visit?  no  Physicians involved in your care Any changes since last visit?  no   Family History  Problem Relation Age of Onset  . Heart disease Mother   . Heart disease Father    Social History   Social History  . Marital Status: Widowed    Spouse Name: N/A  . Number of Children: N/A  . Years of Education: GED   Occupational History  . retired    Social History Main Topics  . Smoking status: Former Smoker -- 3.00 packs/day for 38 years    Quit date: 04/10/1988  . Smokeless tobacco: Never Used  . Alcohol Use: No  . Drug Use: No  . Sexual Activity: Not Asked   Other Topics Concern  . None   Social History Narrative   Past Surgical History  Procedure Laterality Date  . Pci of lad  05/1996    STENT X 4 TOTAL  . Hemorrhoid surgery  1954  . Cataract extraction w/ intraocular lens  implant, bilateral  YRS AGO  . Vasectomy  1968  . Transurethral resection of prostate  08/11/2011    Procedure: TRANSURETHRAL RESECTION OF THE PROSTATE WITH GYRUS INSTRUMENTS;  Surgeon: Fredricka Bonine, MD;  Location: [facility];  Service: Urology;   Laterality: N/A;     . Coronary angioplasty with stent placement    . Lower extremity angiogram Bilateral 09/20/2011    Procedure: LOWER EXTREMITY ANGIOGRAM;  Surgeon: Wellington Hampshire, MD;  Location: [facility];  Service: Cardiovascular;  Laterality: Bilateral;   Past Medical History  Diagnosis Date  . HTN (hypertension)   . CAD (coronary artery disease)     s/p PCI of LAD 1998;  s/p DES to RCA 05/2009;  Myoview 4/13: low risk, no ischemia, EF 56%  . Aortic stenosis     echo 4/12: normal LVF, mild LAE, no significant AS  . HLD (hyperlipidemia)   . PAD (peripheral artery disease) ([place])     s/p L CIA stent 09/2011 ([place])  . Renal artery stenosis ([facility])     s/p bilat RA stents  . Esophageal reflux   . DJD (degenerative joint disease), lumbar 07/06/2011  . Urine incontinence   . Acute myocardial infarction, unspecified site, episode of care unspecified 1998  . COPD (chronic obstructive pulmonary disease) ([place])   . AAA (abdominal aortic aneurysm) ([place])     ultrasound 07/2011:  2.7 x 2.8 cm   BP 164/71 mmHg  Pulse 66  SpO2 96%  Opioid Risk Score:   Fall Risk Score:  `1  Depression screen PHQ 2/9  Depression screen [facility] 2/9 06/07/2015 01/22/2015 01/01/2015 12/04/2014 11/03/2014 07/02/2014 01/15/2014  Decreased Interest 0 0 0 0 0 0 0  Down, Depressed, Hopeless 0 0 0 0 0 0 0  PHQ - 2 Score 0 0 0 0 0 0 0  Altered sleeping - - - - - 0 -  Tired, decreased energy - - - - - 1 -  Change in appetite - - - - - 1 -  Feeling bad or failure about yourself  - - - - - 0 -  Trouble concentrating - - - - - 0 -  Moving slowly or fidgety/restless - - - - - 0 -  Suicidal thoughts - - - - - 0 -  PHQ-9 Score - - - - - 2 -     Review of Systems  All other systems reviewed and are negative.      Objective:   Physical Exam  Constitutional: He is oriented to person, place, and time. He appears well-developed and well-nourished.  HENT:  Head: Normocephalic and atraumatic.  Neck: Normal range of  motion. Neck supple.  Cardiovascular: Normal rate and regular rhythm.   Pulmonary/Chest: Effort normal and breath sounds normal.  Musculoskeletal:  Normal Muscle Bulk and Muscle Testing Reveals: Upper Extremities: Full ROM and Muscle Strength 5/5 Back without spinal or paraspinal tenderness Lower Extremities: Left:Full ROM and Muscle Strength 5/5 Right: Decreased ROM with Flexion and Muscle Strength 5/5 Arises from chair slowly using 4 prong cane for support Narrow Based Gait   Neurological: He is alert and oriented to person, place, and time.  Skin: Skin is warm and dry.  Psychiatric: He has a normal mood and affect.  Nursing note and vitals reviewed.         Assessment & Plan:  1. Patellofemoral arthritis. Continue Voltaren gel, and continue with exercise regimen.  2 Chronic Low Back Pain/ Lumbar Stenosis with radiculopathy: No complaints Today 3. Chronic Pain: Refilled: Hydrocodone 5/325 mg one tablet three times a day #90.  20 minutes of face to face patient care time was spent during this visit. All questions were encouraged and answered.  F/U in 1 month

## 2015-06-12 LAB — TOXASSURE SELECT,+ANTIDEPR,UR: PDF: 0

## 2015-06-22 NOTE — Progress Notes (Signed)
Urine drug screen for this encounter is consistent for prescribed medication 

## 2015-07-05 ENCOUNTER — Encounter: Payer: Self-pay | Admitting: Registered Nurse

## 2015-07-05 ENCOUNTER — Encounter: Payer: Medicare Other | Attending: Registered Nurse | Admitting: Registered Nurse

## 2015-07-05 VITALS — BP 106/58 | HR 69 | Resp 14

## 2015-07-05 DIAGNOSIS — M25561 Pain in right knee: Secondary | ICD-10-CM

## 2015-07-05 DIAGNOSIS — G894 Chronic pain syndrome: Secondary | ICD-10-CM | POA: Diagnosis not present

## 2015-07-05 DIAGNOSIS — M4716 Other spondylosis with myelopathy, lumbar region: Secondary | ICD-10-CM

## 2015-07-05 DIAGNOSIS — M222X1 Patellofemoral disorders, right knee: Secondary | ICD-10-CM | POA: Diagnosis not present

## 2015-07-05 DIAGNOSIS — M1611 Unilateral primary osteoarthritis, right hip: Secondary | ICD-10-CM | POA: Insufficient documentation

## 2015-07-05 DIAGNOSIS — M1711 Unilateral primary osteoarthritis, right knee: Secondary | ICD-10-CM | POA: Insufficient documentation

## 2015-07-05 DIAGNOSIS — Z5181 Encounter for therapeutic drug level monitoring: Secondary | ICD-10-CM | POA: Diagnosis not present

## 2015-07-05 DIAGNOSIS — Z79899 Other long term (current) drug therapy: Secondary | ICD-10-CM | POA: Diagnosis not present

## 2015-07-05 MED ORDER — HYDROCODONE-ACETAMINOPHEN 5-325 MG PO TABS: 1.0000 | ORAL_TABLET | Freq: Three times a day (TID) | ORAL | Status: DC

## 2015-07-05 NOTE — Progress Notes (Signed)
Subjective:    Patient ID: Kristopher [name], male    DOB: Aug 31, 1930, 82 y.o.   MRN: [id number]  HPI: [name] is a 82 year old male who returns for follow up for chronic pain and medication refill. He states his pain is located in his right knee. He rates his pain 2. His current exercise regime is walking and performing stretching exercises.  Pain Inventory Average Pain 3 Pain Right Now 2 My pain is constant, aching and throbbing  In the last 24 hours, has pain interfered with the following? General activity 0 Relation with others 0 Enjoyment of life 0 What TIME of day is your pain at its worst? all Sleep (in general) Good  Pain is worse with: any activity Pain improves with: rest and medication Relief from Meds: 8  Mobility walk without assistance walk with assistance use a cane how many minutes can you walk? 30 ability to climb steps?  yes do you drive?  yes  Function retired  Neuro/Psych No problems in this area  Prior Studies Any changes since last visit?  no  Physicians involved in your care Primary care Dr. Jenny Reichmann cardiologist - Dr. Stanford Breed   Family History  Problem Relation Age of Onset  . Heart disease Mother   . Heart disease Father    Social History   Social History  . Marital Status: Widowed    Spouse Name: N/A  . Number of Children: N/A  . Years of Education: GED   Occupational History  . retired    Social History Main Topics  . Smoking status: Former Smoker -- 3.00 packs/day for 38 years    Quit date: 04/10/1988  . Smokeless tobacco: Never Used  . Alcohol Use: No  . Drug Use: No  . Sexual Activity: Not Asked   Other Topics Concern  . None   Social History Narrative   Past Surgical History  Procedure Laterality Date  . Pci of lad  05/1996    STENT X 4 TOTAL  . Hemorrhoid surgery  1954  . Cataract extraction w/ intraocular lens  implant, bilateral  YRS AGO  . Vasectomy  1968  . Transurethral resection of prostate   08/11/2011    Procedure: TRANSURETHRAL RESECTION OF THE PROSTATE WITH GYRUS INSTRUMENTS;  Surgeon: Fredricka Bonine, MD;  Location: [facility];  Service: Urology;  Laterality: N/A;     . Coronary angioplasty with stent placement    . Lower extremity angiogram Bilateral 09/20/2011    Procedure: LOWER EXTREMITY ANGIOGRAM;  Surgeon: Wellington Hampshire, MD;  Location: [place] CATH LAB;  Service: Cardiovascular;  Laterality: Bilateral;   Past Medical History  Diagnosis Date  . HTN (hypertension)   . CAD (coronary artery disease)     s/p PCI of LAD 1998;  s/p DES to RCA 05/2009;  Myoview 4/13: low risk, no ischemia, EF 56%  . Aortic stenosis     echo 4/12: normal LVF, mild LAE, no significant AS  . HLD (hyperlipidemia)   . PAD (peripheral artery disease) ([place])     s/p L CIA stent 09/2011 ([place])  . Renal artery stenosis ([facility])     s/p bilat RA stents  . Esophageal reflux   . DJD (degenerative joint disease), lumbar 07/06/2011  . Urine incontinence   . Acute myocardial infarction, unspecified site, episode of care unspecified 1998  . COPD (chronic obstructive pulmonary disease) ([place])   . AAA (abdominal aortic aneurysm) ([place])     ultrasound 07/2011:  2.7 x 2.8 cm   BP 106/58 mmHg  Pulse 69  Resp 14  SpO2 93%  Opioid Risk Score:   Fall Risk Score:  `1  Depression screen PHQ 2/9  Depression screen [facility] 2/9 06/07/2015 01/22/2015 01/01/2015 12/04/2014 11/03/2014 07/02/2014 01/15/2014  Decreased Interest 0 0 0 0 0 0 0  Down, Depressed, Hopeless 0 0 0 0 0 0 0  PHQ - 2 Score 0 0 0 0 0 0 0  Altered sleeping - - - - - 0 -  Tired, decreased energy - - - - - 1 -  Change in appetite - - - - - 1 -  Feeling bad or failure about yourself  - - - - - 0 -  Trouble concentrating - - - - - 0 -  Moving slowly or fidgety/restless - - - - - 0 -  Suicidal thoughts - - - - - 0 -  PHQ-9 Score - - - - - 2 -      Review of Systems  Respiratory: Positive for shortness of breath and wheezing.   Hematological:  Bruises/bleeds easily.  All other systems reviewed and are negative.      Objective:   Physical Exam  Constitutional: He is oriented to person, place, and time. He appears well-developed and well-nourished.  HENT:  Head: Normocephalic and atraumatic.  Neck: Normal range of motion. Neck supple.  Cardiovascular: Normal rate and regular rhythm.   Pulmonary/Chest: Effort normal and breath sounds normal.  Musculoskeletal:  Normal Muscle Bulk and Muscle Testing Reveals: Upper Extremities: Full ROM and Muscle Strength 5/5 Back without spinal tenderness Lower Extremities: Full ROM and Muscle Strength 5/5 Right Lower Extremity Flexion Produces Pain into Patella Arises from chair with ease/ using 3 Prong cane for support Narrow Based Gait  Neurological: He is alert and oriented to person, place, and time.  Skin: Skin is warm and dry.  Psychiatric: He has a normal mood and affect.  Nursing note and vitals reviewed.         Assessment & Plan:  1. Patellofemoral arthritis. Continue Voltaren gel, and continue with exercise regimen.  2 Chronic Low Back Pain/ Lumbar Stenosis with radiculopathy: No complaints Today 3. Chronic Pain: Refilled: Hydrocodone 5/325 mg one tablet three times a day #90.  20 minutes of face to face patient care time was spent during this visit. All questions were encouraged and answered.  F/U in 1 month

## 2015-07-08 ENCOUNTER — Other Ambulatory Visit: Payer: Self-pay | Admitting: Cardiology

## 2015-07-08 NOTE — Telephone Encounter (Signed)
REFILL 

## 2015-07-23 ENCOUNTER — Ambulatory Visit: Payer: Medicare Other | Admitting: Internal Medicine

## 2015-07-27 ENCOUNTER — Other Ambulatory Visit: Payer: Self-pay | Admitting: Cardiology

## 2015-07-27 ENCOUNTER — Encounter: Payer: Self-pay | Admitting: Internal Medicine

## 2015-07-27 ENCOUNTER — Ambulatory Visit (INDEPENDENT_AMBULATORY_CARE_PROVIDER_SITE_OTHER): Payer: Medicare Other | Admitting: Internal Medicine

## 2015-07-27 VITALS — BP 116/60 | HR 67 | Temp 97.7°F | Resp 20 | Wt 147.0 lb

## 2015-07-27 DIAGNOSIS — J438 Other emphysema: Secondary | ICD-10-CM | POA: Diagnosis not present

## 2015-07-27 DIAGNOSIS — K219 Gastro-esophageal reflux disease without esophagitis: Secondary | ICD-10-CM | POA: Diagnosis not present

## 2015-07-27 DIAGNOSIS — I10 Essential (primary) hypertension: Secondary | ICD-10-CM

## 2015-07-27 DIAGNOSIS — E785 Hyperlipidemia, unspecified: Secondary | ICD-10-CM

## 2015-07-27 NOTE — Progress Notes (Signed)
Subjective:    Patient ID: Kristopher [name], male    DOB: 05-13-1930, 82 y.o.   MRN: [id number]  HPI  Here to f/u; overall doing ok,  Pt denies chest pain, increasing sob or doe, wheezing, orthopnea, PND, increased LE swelling, palpitations, dizziness or syncope.  Pt denies new neurological symptoms such as new headache, or facial or extremity weakness or numbness.  Pt denies polydipsia, polyuria, or low sugar episode.   Pt denies new neurological symptoms such as new headache, or facial or extremity weakness or numbness.   Pt states overall good compliance with meds, mostly trying to follow appropriate diet, with wt overall stable,  but little exercise however. Denies worsening reflux, abd pain, dysphagia, n/v, bowel change or blood. No new complaints Past Medical History  Diagnosis Date  . HTN (hypertension)   . CAD (coronary artery disease)     s/p PCI of LAD 1998;  s/p DES to RCA 05/2009;  Myoview 4/13: low risk, no ischemia, EF 56%  . Aortic stenosis     echo 4/12: normal LVF, mild LAE, no significant AS  . HLD (hyperlipidemia)   . PAD (peripheral artery disease) ([place])     s/p L CIA stent 09/2011 ([facility])  . Renal artery stenosis ([facility])     s/p bilat RA stents  . Esophageal reflux   . DJD (degenerative joint disease), lumbar 07/06/2011  . Urine incontinence   . Acute myocardial infarction, unspecified site, episode of care unspecified 1998  . COPD (chronic obstructive pulmonary disease) ([place])   . AAA (abdominal aortic aneurysm) ([place])     ultrasound 07/2011:  2.7 x 2.8 cm   Past Surgical History  Procedure Laterality Date  . Pci of lad  05/1996    STENT X 4 TOTAL  . Hemorrhoid surgery  1954  . Cataract extraction w/ intraocular lens  implant, bilateral  YRS AGO  . Vasectomy  1968  . Transurethral resection of prostate  08/11/2011    Procedure: TRANSURETHRAL RESECTION OF THE PROSTATE WITH GYRUS INSTRUMENTS;  Surgeon: Fredricka Bonine, MD;  Location: [facility];  Service: Urology;   Laterality: N/A;     . Coronary angioplasty with stent placement    . Lower extremity angiogram Bilateral 09/20/2011    Procedure: LOWER EXTREMITY ANGIOGRAM;  Surgeon: Wellington Hampshire, MD;  Location: [facility];  Service: Cardiovascular;  Laterality: Bilateral;    reports that he quit smoking about 27 years ago. He has never used smokeless tobacco. He reports that he does not drink alcohol or use illicit drugs. family history includes Heart disease in his father and mother. Allergies  Allergen Reactions  . Influenza Vaccines Other (See Comments)    unknown  . Sulfa Antibiotics Rash   Current Outpatient Prescriptions on File Prior to Visit  Medication Sig Dispense Refill  . Ascorbic Acid (VITAMIN C) 500 MG tablet Take 500 mg by mouth 2 (two) times daily.     Marland Kitchen aspirin 325 MG EC tablet Take 325 mg by mouth daily.    Marland Kitchen BEE POLLEN PO Take by mouth.    Marland Kitchen Co-Enzyme Q-10 100 MG CAPS Take 1 capsule by mouth daily.    . diclofenac sodium (VOLTAREN) 1 % GEL Apply 2 g topically 4 (four) times daily. 9 Tube 1  . fish oil-omega-3 fatty acids 1000 MG capsule Take 1 g by mouth daily.     Marland Kitchen HYDROcodone-acetaminophen (NORCO/VICODIN) 5-325 MG tablet Take 1 tablet by mouth 3 (three) times daily before  meals. 90 tablet 0  . Multiple Vitamin (MULTIVITAMIN) capsule Take 1 capsule by mouth daily.     . Multiple Vitamins-Minerals (PRESERVISION AREDS PO) Take 2 tablets by mouth daily.    . nitroGLYCERIN (NITROSTAT) 0.4 MG SL tablet Place 1 tablet (0.4 mg total) under the tongue every 5 (five) minutes as needed for chest pain. 90 tablet 3  . omeprazole (PRILOSEC) 20 MG capsule TAKE 2 CAPSULES DAILY 180 capsule 1  . ramipril (ALTACE) 10 MG capsule Take 1 capsule (10 mg total) by mouth daily. NEED OV. 90 capsule 0   No current facility-administered medications on file prior to visit.   Review of Systems  Constitutional: Negative for unusual diaphoresis or night sweats HENT: Negative for ear swelling or  discharge Eyes: Negative for worsening visual haziness  Respiratory: Negative for choking and stridor.   Gastrointestinal: Negative for distension or worsening eructation Genitourinary: Negative for retention or change in urine volume.  Musculoskeletal: Negative for other MSK pain or swelling Skin: Negative for color change and worsening wound Neurological: Negative for tremors and numbness other than noted  Psychiatric/Behavioral: Negative for decreased concentration or agitation other than above       Objective:   Physical Exam BP 116/60 mmHg  Pulse 67  Temp(Src) 97.7 F (36.5 C) (Oral)  Resp 20  Wt 147 lb (66.679 kg)  SpO2 95% VS noted,  Constitutional: Pt appears in no apparent distress HENT: Head: NCAT.  Right Ear: External ear normal.  Left Ear: External ear normal.  Eyes: . Pupils are equal, round, and reactive to light. Conjunctivae and EOM are normal Neck: Normal range of motion. Neck supple.  Cardiovascular: Normal rate and regular rhythm.   Pulmonary/Chest: Effort normal and breath sounds decreased without rales or wheezing.  Abd:  Soft, NT, ND, + BS Neurological: Pt is alert. Not confused , motor grossly intact Skin: Skin is warm. No rash, no LE edema Psychiatric: Pt behavior is normal. No agitation.     Assessment & Plan:

## 2015-07-27 NOTE — Telephone Encounter (Signed)
Rx request sent to pharmacy.  

## 2015-07-27 NOTE — Patient Instructions (Addendum)
You had the Pnuemovax shot today, and Td (tetanus shot) today  Please continue all other medications as before, and refills have been done if requested.  Please have the pharmacy call with any other refills you may need.  Please continue your efforts at being more active, low cholesterol diet, and weight control.  You are otherwise up to date with prevention measures today.  Please keep your appointments with your specialists as you may have planned  Please return in 6 months, or sooner if needed

## 2015-07-27 NOTE — Progress Notes (Signed)
Pre visit review using our clinic review tool, if applicable. No additional management support is needed unless otherwise documented below in the visit note. 

## 2015-07-28 NOTE — Assessment & Plan Note (Signed)
stable overall by history and exam, recent data reviewed with pt, and pt to continue medical treatment as before,  to f/u any worsening symptoms or concerns Lab Results  Component Value Date   WBC 9.1 01/22/2015   HGB 13.9 01/22/2015   HCT 41.7 01/22/2015   PLT 207.0 01/22/2015   GLUCOSE 82 01/22/2015   CHOL 134 01/22/2015   TRIG 124.0 01/22/2015   HDL 41.80 01/22/2015   LDLCALC 67 01/22/2015   ALT 22 01/22/2015   AST 22 01/22/2015   NA 144 01/22/2015   K 4.9 01/22/2015   CL 110 01/22/2015   CREATININE 1.09 01/22/2015   BUN 27* 01/22/2015   CO2 27 01/22/2015   TSH 2.29 01/22/2015   INR 1.01 08/31/2013

## 2015-07-28 NOTE — Assessment & Plan Note (Signed)
stable overall by history and exam, recent data reviewed with pt, and pt to continue medical treatment as before,  to f/u any worsening symptoms or concerns BP Readings from Last 3 Encounters:  07/27/15 116/60  07/05/15 106/58  06/07/15 164/71

## 2015-07-28 NOTE — Assessment & Plan Note (Signed)
stable overall by history and exam, recent data reviewed with pt, and pt to continue medical treatment as before,  to f/u any worsening symptoms or concerns SpO2 Readings from Last 3 Encounters:  07/27/15 95%  07/05/15 93%  06/07/15 96%

## 2015-07-28 NOTE — Assessment & Plan Note (Signed)
stable overall by history and exam, recent data reviewed with pt, and pt to continue medical treatment as before,  to f/u any worsening symptoms or concerns Lab Results  Component Value Date   LDLCALC 67 01/22/2015

## 2015-08-03 ENCOUNTER — Encounter: Payer: Self-pay | Admitting: Registered Nurse

## 2015-08-03 ENCOUNTER — Encounter: Payer: Medicare Other | Attending: Registered Nurse | Admitting: Registered Nurse

## 2015-08-03 VITALS — BP 111/59 | HR 64

## 2015-08-03 DIAGNOSIS — G894 Chronic pain syndrome: Secondary | ICD-10-CM | POA: Diagnosis not present

## 2015-08-03 DIAGNOSIS — M1711 Unilateral primary osteoarthritis, right knee: Secondary | ICD-10-CM | POA: Insufficient documentation

## 2015-08-03 DIAGNOSIS — Z79899 Other long term (current) drug therapy: Secondary | ICD-10-CM | POA: Diagnosis not present

## 2015-08-03 DIAGNOSIS — M4716 Other spondylosis with myelopathy, lumbar region: Secondary | ICD-10-CM

## 2015-08-03 DIAGNOSIS — M222X1 Patellofemoral disorders, right knee: Secondary | ICD-10-CM | POA: Diagnosis not present

## 2015-08-03 DIAGNOSIS — Z5181 Encounter for therapeutic drug level monitoring: Secondary | ICD-10-CM

## 2015-08-03 DIAGNOSIS — M1611 Unilateral primary osteoarthritis, right hip: Secondary | ICD-10-CM | POA: Insufficient documentation

## 2015-08-03 DIAGNOSIS — M25561 Pain in right knee: Secondary | ICD-10-CM

## 2015-08-03 MED ORDER — HYDROCODONE-ACETAMINOPHEN 5-325 MG PO TABS: 1.0000 | ORAL_TABLET | Freq: Three times a day (TID) | ORAL | Status: DC

## 2015-08-03 NOTE — Progress Notes (Signed)
Subjective:    Patient ID: Kristopher [name], male    DOB: [birth year], 82 y.o.   MRN: [id number]  HPI: [name] is a 82 year old male who returns for follow up for chronic pain and medication refill. He states his pain is located in his right knee. He rates his pain 2. His current exercise regime is walking, performing stretching exercises and yard work.  Pain Inventory Average Pain 3 Pain Right Now 2 My pain is .  In the last 24 hours, has pain interfered with the following? General activity . Relation with others . Enjoyment of life . What TIME of day is your pain at its worst? . Sleep (in general) .  Pain is worse with: . Pain improves with: . Relief from Meds: .  Mobility use a cane how many minutes can you walk? 15  Function retired  Neuro/Psych No problems in this area  Prior Studies .  Physicians involved in your care .   Family History  Problem Relation Age of Onset  . Heart disease Mother   . Heart disease Father    Social History   Social History  . Marital Status: Widowed    Spouse Name: N/A  . Number of Children: N/A  . Years of Education: GED   Occupational History  . retired    Social History Main Topics  . Smoking status: Former Smoker -- 3.00 packs/day for 38 years    Quit date: 04/10/1988  . Smokeless tobacco: Never Used  . Alcohol Use: No  . Drug Use: No  . Sexual Activity: Not Asked   Other Topics Concern  . None   Social History Narrative   Past Surgical History  Procedure Laterality Date  . Pci of lad  05/1996    STENT X 4 TOTAL  . Hemorrhoid surgery  1954  . Cataract extraction w/ intraocular lens  implant, bilateral  YRS AGO  . Vasectomy  1968  . Transurethral resection of prostate  08/11/2011    Procedure: TRANSURETHRAL RESECTION OF THE PROSTATE WITH GYRUS INSTRUMENTS;  Surgeon: Fredricka Bonine, MD;  Location: [facility];  Service: Urology;  Laterality: N/A;     . Coronary angioplasty with stent placement     . Lower extremity angiogram Bilateral 09/20/2011    Procedure: LOWER EXTREMITY ANGIOGRAM;  Surgeon: Wellington Hampshire, MD;  Location: [facility];  Service: Cardiovascular;  Laterality: Bilateral;   Past Medical History  Diagnosis Date  . HTN (hypertension)   . CAD (coronary artery disease)     s/p PCI of LAD 1998;  s/p DES to RCA 05/2009;  Myoview 4/13: low risk, no ischemia, EF 56%  . Aortic stenosis     echo 4/12: normal LVF, mild LAE, no significant AS  . HLD (hyperlipidemia)   . PAD (peripheral artery disease) ([place])     s/p L CIA stent 09/2011 ([facility])  . Renal artery stenosis ([facility])     s/p bilat RA stents  . Esophageal reflux   . DJD (degenerative joint disease), lumbar 07/06/2011  . Urine incontinence   . Acute myocardial infarction, unspecified site, episode of care unspecified 1998  . COPD (chronic obstructive pulmonary disease) ([place])   . AAA (abdominal aortic aneurysm) ([place])     ultrasound 07/2011:  2.7 x 2.8 cm   BP 111/59 mmHg  Pulse 64  SpO2 96%  Opioid Risk Score:   Fall Risk Score:  `1  Depression screen PHQ 2/9  Depression  screen [facility] 2/9 07/27/2015 06/07/2015 01/22/2015 01/01/2015 12/04/2014 11/03/2014 07/02/2014  Decreased Interest 0 0 0 0 0 0 0  Down, Depressed, Hopeless 0 0 0 0 0 0 0  PHQ - 2 Score 0 0 0 0 0 0 0  Altered sleeping - - - - - - 0  Tired, decreased energy - - - - - - 1  Change in appetite - - - - - - 1  Feeling bad or failure about yourself  - - - - - - 0  Trouble concentrating - - - - - - 0  Moving slowly or fidgety/restless - - - - - - 0  Suicidal thoughts - - - - - - 0  PHQ-9 Score - - - - - - 2     Review of Systems     Objective:   Physical Exam  Constitutional: He is oriented to person, place, and time. He appears well-developed and well-nourished.  HENT:  Head: Normocephalic and atraumatic.  Neck: Normal range of motion. Neck supple.  Cardiovascular: Normal rate and regular rhythm.   Pulmonary/Chest: Effort normal and breath sounds  normal.  Musculoskeletal:  Normal Muscle Bulk and Muscle Testing Reveals: Upper Extremities: Full ROM and Muscle Strength 5/5 Lower Extremities: Full ROM and Muscle Strength 5/5 Right Lower Extremity Flexion Produces Pain into Patella Arises from chair slowly using 4 prong gait for support Wide based Gait    Neurological: He is alert and oriented to person, place, and time.  Skin: Skin is warm and dry.  Psychiatric: He has a normal mood and affect.  Nursing note and vitals reviewed.         Assessment & Plan:  1. Patellofemoral arthritis. Continue Voltaren gel, and continue with exercise regimen.  2 Chronic Low Back Pain/ Lumbar Stenosis with radiculopathy: No complaints Today 3. Chronic Pain: Refilled: Hydrocodone 5/325 mg one tablet three times a day #90. We will continue the opioid monitoring program, this consists of regular clinic visits, examinations, urine drug screen, pill counts as well as use of [place] Controlled Substance reporting System.  20 minutes of face to face patient care time was spent during this visit. All questions were encouraged and answered.  F/U in 1 month

## 2015-09-02 ENCOUNTER — Encounter: Payer: Medicare Other | Attending: Registered Nurse | Admitting: Registered Nurse

## 2015-09-02 ENCOUNTER — Encounter: Payer: Self-pay | Admitting: Registered Nurse

## 2015-09-02 VITALS — BP 138/83 | HR 70

## 2015-09-02 DIAGNOSIS — Z5181 Encounter for therapeutic drug level monitoring: Secondary | ICD-10-CM | POA: Diagnosis not present

## 2015-09-02 DIAGNOSIS — Z79899 Other long term (current) drug therapy: Secondary | ICD-10-CM | POA: Diagnosis not present

## 2015-09-02 DIAGNOSIS — M222X1 Patellofemoral disorders, right knee: Secondary | ICD-10-CM

## 2015-09-02 DIAGNOSIS — M1711 Unilateral primary osteoarthritis, right knee: Secondary | ICD-10-CM | POA: Diagnosis not present

## 2015-09-02 DIAGNOSIS — G894 Chronic pain syndrome: Secondary | ICD-10-CM | POA: Diagnosis not present

## 2015-09-02 DIAGNOSIS — M25561 Pain in right knee: Secondary | ICD-10-CM

## 2015-09-02 DIAGNOSIS — M1611 Unilateral primary osteoarthritis, right hip: Secondary | ICD-10-CM | POA: Insufficient documentation

## 2015-09-02 MED ORDER — HYDROCODONE-ACETAMINOPHEN 5-325 MG PO TABS: 1.0000 | ORAL_TABLET | Freq: Three times a day (TID) | ORAL | Status: DC

## 2015-09-02 NOTE — Progress Notes (Signed)
Subjective:    Patient ID: Kristopher [name], male    DOB: October 22, 1930, 82 y.o.   MRN: [id number]  HPI: Kristopher [name] is a 82 year old male who returns for follow up for chronic pain and medication refill. He states his pain is located in his right knee. He did not rate his pain today. His current exercise regime is walking, performing stretching exercises and yard work.  Pain Inventory Average Pain 3 Pain Right Now NA My pain is NA  In the last 24 hours, has pain interfered with the following? General activity NA Relation with others NA Enjoyment of life NA What TIME of day is your pain at its worst? NA Sleep (in general) NA  Pain is worse with: NA Pain improves with: NA Relief from Meds: NA  Mobility Do you have any goals in this area?  no  Function Do you have any goals in this area?  no  Neuro/Psych No problems in this area  Prior Studies Any changes since last visit?  no  Physicians involved in your care Any changes since last visit?  no   Family History  Problem Relation Age of Onset  . Heart disease Mother   . Heart disease Father    Social History   Social History  . Marital Status: Widowed    Spouse Name: N/A  . Number of Children: N/A  . Years of Education: GED   Occupational History  . retired    Social History Main Topics  . Smoking status: Former Smoker -- 3.00 packs/day for 38 years    Quit date: 04/10/1988  . Smokeless tobacco: Never Used  . Alcohol Use: No  . Drug Use: No  . Sexual Activity: Not Asked   Other Topics Concern  . None   Social History Narrative   Past Surgical History  Procedure Laterality Date  . Pci of lad  05/1996    STENT X 4 TOTAL  . Hemorrhoid surgery  1954  . Cataract extraction w/ intraocular lens  implant, bilateral  YRS AGO  . Vasectomy  1968  . Transurethral resection of prostate  08/11/2011    Procedure: TRANSURETHRAL RESECTION OF THE PROSTATE WITH GYRUS INSTRUMENTS;  Surgeon: Fredricka Bonine,  MD;  Location: [facility];  Service: Urology;  Laterality: N/A;     . Coronary angioplasty with stent placement    . Lower extremity angiogram Bilateral 09/20/2011    Procedure: LOWER EXTREMITY ANGIOGRAM;  Surgeon: Wellington Hampshire, MD;  Location: [facility];  Service: Cardiovascular;  Laterality: Bilateral;   Past Medical History  Diagnosis Date  . HTN (hypertension)   . CAD (coronary artery disease)     s/p PCI of LAD 1998;  s/p DES to RCA 05/2009;  Myoview 4/13: low risk, no ischemia, EF 56%  . Aortic stenosis     echo 4/12: normal LVF, mild LAE, no significant AS  . HLD (hyperlipidemia)   . PAD (peripheral artery disease) ([place])     s/p L CIA stent 09/2011 ([facility])  . Renal artery stenosis ([facility])     s/p bilat RA stents  . Esophageal reflux   . DJD (degenerative joint disease), lumbar 07/06/2011  . Urine incontinence   . Acute myocardial infarction, unspecified site, episode of care unspecified 1998  . COPD (chronic obstructive pulmonary disease) ([place])   . AAA (abdominal aortic aneurysm) ([place])     ultrasound 07/2011:  2.7 x 2.8 cm   BP 138/83 mmHg  Pulse 70  SpO2 95%  Opioid Risk Score:   Fall Risk Score:  `1  Depression screen PHQ 2/9  Depression screen [facility] 2/9 07/27/2015 06/07/2015 01/22/2015 01/01/2015 12/04/2014 11/03/2014 07/02/2014  Decreased Interest 0 0 0 0 0 0 0  Down, Depressed, Hopeless 0 0 0 0 0 0 0  PHQ - 2 Score 0 0 0 0 0 0 0  Altered sleeping - - - - - - 0  Tired, decreased energy - - - - - - 1  Change in appetite - - - - - - 1  Feeling bad or failure about yourself  - - - - - - 0  Trouble concentrating - - - - - - 0  Moving slowly or fidgety/restless - - - - - - 0  Suicidal thoughts - - - - - - 0  PHQ-9 Score - - - - - - 2     Review of Systems  All other systems reviewed and are negative.      Objective:   Physical Exam  Constitutional: He is oriented to person, place, and time. He appears well-developed and well-nourished.  HENT:  Head: Normocephalic  and atraumatic.  Neck: Normal range of motion. Neck supple.  Cardiovascular: Normal rate and regular rhythm.   Pulmonary/Chest: Effort normal and breath sounds normal.  Musculoskeletal:  Normal Muscle Bulk and Muscle Testing Reveals: Upper Extremities: Full ROM and Muscle Strength 5/5 Lower Extremities: Left: Full ROM and Muscle Strength 5/5 Right: Decreased ROM and Muscle Strength 5/5 Arises form chair slowly using 4 prong cane for support Narrow Based Gait  Neurological: He is alert and oriented to person, place, and time.  Skin: Skin is warm and dry.  Psychiatric: He has a normal mood and affect.  Nursing note and vitals reviewed.         Assessment & Plan:  1. Patellofemoral arthritis. Continue Voltaren gel, and continue with exercise regimen.  2 Chronic Low Back Pain/ Lumbar Stenosis with radiculopathy: No complaints Today 3. Chronic Pain: Refilled: Hydrocodone 5/325 mg one tablet three times a day #90. We will continue the opioid monitoring program, this consists of regular clinic visits, examinations, urine drug screen, pill counts as well as use of [place] Controlled Substance reporting System.  20 minutes of face to face patient care time was spent during this visit. All questions were encouraged and answered.  F/U in 1 month

## 2015-10-03 ENCOUNTER — Other Ambulatory Visit: Payer: Self-pay | Admitting: Internal Medicine

## 2015-10-04 ENCOUNTER — Ambulatory Visit (HOSPITAL_BASED_OUTPATIENT_CLINIC_OR_DEPARTMENT_OTHER): Payer: Medicare Other | Admitting: Physical Medicine & Rehabilitation

## 2015-10-04 ENCOUNTER — Encounter: Payer: Medicare Other | Attending: Registered Nurse

## 2015-10-04 ENCOUNTER — Encounter: Payer: Self-pay | Admitting: Physical Medicine & Rehabilitation

## 2015-10-04 VITALS — BP 107/70 | HR 67 | Resp 14

## 2015-10-04 DIAGNOSIS — G894 Chronic pain syndrome: Secondary | ICD-10-CM | POA: Insufficient documentation

## 2015-10-04 DIAGNOSIS — M1711 Unilateral primary osteoarthritis, right knee: Secondary | ICD-10-CM | POA: Diagnosis not present

## 2015-10-04 DIAGNOSIS — M48062 Spinal stenosis, lumbar region with neurogenic claudication: Secondary | ICD-10-CM

## 2015-10-04 DIAGNOSIS — Z79899 Other long term (current) drug therapy: Secondary | ICD-10-CM | POA: Insufficient documentation

## 2015-10-04 DIAGNOSIS — M1611 Unilateral primary osteoarthritis, right hip: Secondary | ICD-10-CM

## 2015-10-04 DIAGNOSIS — M5417 Radiculopathy, lumbosacral region: Secondary | ICD-10-CM

## 2015-10-04 DIAGNOSIS — Z5181 Encounter for therapeutic drug level monitoring: Secondary | ICD-10-CM | POA: Insufficient documentation

## 2015-10-04 DIAGNOSIS — M4806 Spinal stenosis, lumbar region: Secondary | ICD-10-CM

## 2015-10-04 DIAGNOSIS — M5416 Radiculopathy, lumbar region: Secondary | ICD-10-CM

## 2015-10-04 MED ORDER — HYDROCODONE-ACETAMINOPHEN 5-325 MG PO TABS: 1.0000 | ORAL_TABLET | Freq: Three times a day (TID) | ORAL | Status: DC

## 2015-10-04 NOTE — Patient Instructions (Signed)
Please call if you need another knee injection prior to 6 months

## 2015-10-04 NOTE — Progress Notes (Signed)
Subjective:    Patient ID: Kristopher [name], male    DOB: [birth year], 82 y.o.   MRN: [id number]  HPI 82 year old male with history of chronic knee pain and hip pain and back pain. He has remained active on his hydrocodone 5 mg 3 times a day.  He feels like he is doing quite well he is more active during the summer. He mows the lawn. He is independent with all his self-care and mobility using a cane  Pain Inventory Average Pain 3 Pain Right Now 2 My pain is intermittent, sharp and burning  In the last 24 hours, has pain interfered with the following? General activity no selection Relation with others no selection Enjoyment of life no selection What TIME of day is your pain at its worst? evening Sleep (in general) Good  Pain is worse with: walking, bending, standing and some activites Pain improves with: medication Relief from Meds: 9  Mobility walk with assistance use a cane ability to climb steps?  yes do you drive?  yes  Function retired  Neuro/Psych weakness numbness trouble walking  Prior Studies Any changes since last visit?  no  Physicians involved in your care Any changes since last visit?  no   Family History  Problem Relation Age of Onset  . Heart disease Mother   . Heart disease Father    Social History   Social History  . Marital Status: Widowed    Spouse Name: N/A  . Number of Children: N/A  . Years of Education: GED   Occupational History  . retired    Social History Main Topics  . Smoking status: Former Smoker -- 3.00 packs/day for 38 years    Quit date: 04/10/1988  . Smokeless tobacco: Never Used  . Alcohol Use: No  . Drug Use: No  . Sexual Activity: Not Asked   Other Topics Concern  . None   Social History Narrative   Past Surgical History  Procedure Laterality Date  . Pci of lad  05/1996    STENT X 4 TOTAL  . Hemorrhoid surgery  1954  . Cataract extraction w/ intraocular lens  implant, bilateral  YRS AGO  . Vasectomy   1968  . Transurethral resection of prostate  08/11/2011    Procedure: TRANSURETHRAL RESECTION OF THE PROSTATE WITH GYRUS INSTRUMENTS;  Surgeon: Fredricka Bonine, MD;  Location: [facility];  Service: Urology;  Laterality: N/A;     . Coronary angioplasty with stent placement    . Lower extremity angiogram Bilateral 09/20/2011    Procedure: LOWER EXTREMITY ANGIOGRAM;  Surgeon: Wellington Hampshire, MD;  Location: [facility];  Service: Cardiovascular;  Laterality: Bilateral;   Past Medical History  Diagnosis Date  . HTN (hypertension)   . CAD (coronary artery disease)     s/p PCI of LAD 1998;  s/p DES to RCA 05/2009;  Myoview 4/13: low risk, no ischemia, EF 56%  . Aortic stenosis     echo 4/12: normal LVF, mild LAE, no significant AS  . HLD (hyperlipidemia)   . PAD (peripheral artery disease) ([place])     s/p L CIA stent 09/2011 ([facility])  . Renal artery stenosis ([facility])     s/p bilat RA stents  . Esophageal reflux   . DJD (degenerative joint disease), lumbar 07/06/2011  . Urine incontinence   . Acute myocardial infarction, unspecified site, episode of care unspecified 1998  . COPD (chronic obstructive pulmonary disease) ([place])   . AAA (abdominal aortic aneurysm) ([place])  ultrasound 07/2011:  2.7 x 2.8 cm   BP 107/70 mmHg  Pulse 67  Resp 14  Opioid Risk Score:   Fall Risk Score:  `1  Depression screen PHQ 2/9  Depression screen [facility] 2/9 07/27/2015 06/07/2015 01/22/2015 01/01/2015 12/04/2014 11/03/2014 07/02/2014  Decreased Interest 0 0 0 0 0 0 0  Down, Depressed, Hopeless 0 0 0 0 0 0 0  PHQ - 2 Score 0 0 0 0 0 0 0  Altered sleeping - - - - - - 0  Tired, decreased energy - - - - - - 1  Change in appetite - - - - - - 1  Feeling bad or failure about yourself  - - - - - - 0  Trouble concentrating - - - - - - 0  Moving slowly or fidgety/restless - - - - - - 0  Suicidal thoughts - - - - - - 0  PHQ-9 Score - - - - - - 2     Review of Systems  Constitutional: Negative for chills.  HENT: Negative  for congestion.   Eyes: Negative for discharge.  Respiratory: Positive for shortness of breath.   Cardiovascular: Negative for leg swelling.  Gastrointestinal: Negative for abdominal pain.  Endocrine: Negative for polyuria.  Genitourinary: Negative for difficulty urinating.  Musculoskeletal: Positive for gait problem.  Allergic/Immunologic: Negative for immunocompromised state.  Neurological: Positive for weakness and numbness.  Psychiatric/Behavioral: Negative for dysphoric mood.       Objective:   Physical Exam  Decreased right L4 pinprick versus left Deep tendon reflexes 1+ right knee 2+ left knee 0 bilateral ankles Motor strength is 5/5 bilateral hip flexor and knee extensor and ankle dorsiflexor Negative straight leg raise Hip has no pain with internal or external rotation, and No evidence of knee effusion no pain with knee range of motion. No tenderness along the medial or lateral joint lines.      Assessment & Plan:  1. Right knee osteoarthritis, right hip osteoarthritis as well as lumbar spinal stenosis. Continue hydrocodone.Current dose 5 mg 3 times a day Last urine drug screen February 2017 was appropriate Continue opioid monitoring program. This consists of regular clinic visits, examinations, urine drug screen, pill counts as well as use of [place] controlled substance reporting System.

## 2015-10-06 ENCOUNTER — Other Ambulatory Visit: Payer: Self-pay | Admitting: Cardiology

## 2015-10-06 NOTE — Telephone Encounter (Signed)
Rx(s) sent to pharmacy electronically.  

## 2015-10-12 LAB — TOXASSURE SELECT,+ANTIDEPR,UR: PDF: 0

## 2015-10-28 NOTE — Progress Notes (Signed)
Urine drug screen for this encounter is consistent for prescribed medication 

## 2015-11-02 ENCOUNTER — Encounter: Payer: Medicare Other | Attending: Registered Nurse

## 2015-11-02 ENCOUNTER — Encounter: Payer: Self-pay | Admitting: Physical Medicine & Rehabilitation

## 2015-11-02 ENCOUNTER — Ambulatory Visit (HOSPITAL_BASED_OUTPATIENT_CLINIC_OR_DEPARTMENT_OTHER): Payer: Medicare Other | Admitting: Physical Medicine & Rehabilitation

## 2015-11-02 VITALS — BP 103/57 | HR 63

## 2015-11-02 DIAGNOSIS — G894 Chronic pain syndrome: Secondary | ICD-10-CM

## 2015-11-02 DIAGNOSIS — M4806 Spinal stenosis, lumbar region: Secondary | ICD-10-CM | POA: Diagnosis not present

## 2015-11-02 DIAGNOSIS — Z5181 Encounter for therapeutic drug level monitoring: Secondary | ICD-10-CM | POA: Diagnosis not present

## 2015-11-02 DIAGNOSIS — M1711 Unilateral primary osteoarthritis, right knee: Secondary | ICD-10-CM

## 2015-11-02 DIAGNOSIS — M1611 Unilateral primary osteoarthritis, right hip: Secondary | ICD-10-CM | POA: Insufficient documentation

## 2015-11-02 DIAGNOSIS — M48062 Spinal stenosis, lumbar region with neurogenic claudication: Secondary | ICD-10-CM

## 2015-11-02 DIAGNOSIS — Z79899 Other long term (current) drug therapy: Secondary | ICD-10-CM | POA: Insufficient documentation

## 2015-11-02 MED ORDER — HYDROCODONE-ACETAMINOPHEN 5-325 MG PO TABS: 1.0000 | ORAL_TABLET | Freq: Three times a day (TID) | ORAL | 0 refills | Status: DC

## 2015-11-02 NOTE — Progress Notes (Signed)
Subjective:    Patient ID: Kristopher [name], male    DOB: 11/17/1930, 82 y.o.   MRN: [id number]  HPI Patient has had no changes since last month. Continues to have back pain, hip pain, knee pain. He gets some pulling in the muscle above the knee. When he extends his knee. No falls at home. Remains independent with all his self-care and mobility using a cane. Self propelled push mower for lawn. Pain Inventory Average Pain 3 Pain Right Now 3 My pain is dull  In the last 24 hours, has pain interfered with the following? General activity 0 Relation with others 0 Enjoyment of life 0 What TIME of day is your pain at its worst? . Sleep (in general) Good  Pain is worse with: walking Pain improves with: medication Relief from Meds: 7  Mobility use a cane  Function retired  Neuro/Psych No problems in this area  Prior Studies Any changes since last visit?  no  Physicians involved in your care Any changes since last visit?  no   Family History  Problem Relation Age of Onset  . Heart disease Mother   . Heart disease Father    Social History   Social History  . Marital status: Widowed    Spouse name: N/A  . Number of children: N/A  . Years of education: GED   Occupational History  . retired    Social History Main Topics  . Smoking status: Former Smoker    Packs/day: 3.00    Years: 38.00    Quit date: 04/10/1988  . Smokeless tobacco: Never Used  . Alcohol use No  . Drug use: No  . Sexual activity: Not Asked   Other Topics Concern  . None   Social History Narrative  . None   Past Surgical History:  Procedure Laterality Date  . CATARACT EXTRACTION W/ INTRAOCULAR LENS  IMPLANT, BILATERAL  YRS AGO  . CORONARY ANGIOPLASTY WITH STENT PLACEMENT    . [place]  . LOWER EXTREMITY ANGIOGRAM Bilateral 09/20/2011   Procedure: LOWER EXTREMITY ANGIOGRAM;  Surgeon: Wellington Hampshire, MD;  Location: [facility] CATH LAB;  Service: Cardiovascular;  Laterality:  Bilateral;  . PCI of LAD  05/1996   STENT X 4 TOTAL  . TRANSURETHRAL RESECTION OF PROSTATE  08/11/2011   Procedure: TRANSURETHRAL RESECTION OF THE PROSTATE WITH GYRUS INSTRUMENTS;  Surgeon: Fredricka Bonine, MD;  Location: [facility];  Service: Urology;  Laterality: N/A;     . VASECTOMY  1968   Past Medical History:  Diagnosis Date  . AAA (abdominal aortic aneurysm) ([place])    ultrasound 07/2011:  2.7 x 2.8 cm  . Acute myocardial infarction, unspecified site, episode of care unspecified 1998  . Aortic stenosis    echo 4/12: normal LVF, mild LAE, no significant AS  . CAD (coronary artery disease)    s/p PCI of LAD 1998;  s/p DES to RCA 05/2009;  Myoview 4/13: low risk, no ischemia, EF 56%  . COPD (chronic obstructive pulmonary disease) ([place])   . DJD (degenerative joint disease), lumbar 07/06/2011  . Esophageal reflux   . HLD (hyperlipidemia)   . HTN (hypertension)   . PAD (peripheral artery disease) ([place])    s/p L CIA stent 09/2011 ([facility])  . Renal artery stenosis (HCC)    s/p bilat RA stents  . Urine incontinence    There were no vitals taken for this visit.  Opioid Risk Score:   Fall Risk Score:  `1  Depression screen PHQ 2/9  Depression screen [facility] 2/9 07/27/2015 06/07/2015 01/22/2015 01/01/2015 12/04/2014 11/03/2014 07/02/2014  Decreased Interest 0 0 0 0 0 0 0  Down, Depressed, Hopeless 0 0 0 0 0 0 0  PHQ - 2 Score 0 0 0 0 0 0 0  Altered sleeping - - - - - - 0  Tired, decreased energy - - - - - - 1  Change in appetite - - - - - - 1  Feeling bad or failure about yourself  - - - - - - 0  Trouble concentrating - - - - - - 0  Moving slowly or fidgety/restless - - - - - - 0  Suicidal thoughts - - - - - - 0  PHQ-9 Score - - - - - - 2   Review of Systems  Constitutional: Negative.   HENT: Negative.   Eyes: Negative.   Respiratory: Negative.   Cardiovascular: Negative.   Gastrointestinal: Negative.   Endocrine: Negative.   Genitourinary: Negative.   Musculoskeletal: Negative.     Skin: Negative.   Allergic/Immunologic: Negative.   Neurological: Negative.   Hematological: Negative.   Psychiatric/Behavioral: Negative.        Objective:   Physical Exam Patient without tenderness to palpation. Lumbar spine. He has decreased lumbar spine range of motion. He has negative straight leg raise. He does have some extension lag with right knee extension. 4/5 in the right quad, 4 at the ankle dorsiflexor fourth hip flexor on the right side, 5/5 on the left side Ambulates with a cane. No evidence to drag or knee instability       Assessment & Plan:  1. Right knee osteoarthritis, right hip osteoarthritis as well as lumbar spinal stenosis. Continue hydrocodone.Current dose 5 mg 3 times a day Last urine drug screen February 2017 was appropriate Continue opioid monitoring program. This consists of regular clinic visits, examinations, urine drug screen, pill counts as well as use of [place] controlled substance reporting System.  Nurse practitioner. Visit 1 months M.D. visit 6 months

## 2015-11-02 NOTE — Patient Instructions (Signed)
Continue to stay active.

## 2015-12-06 ENCOUNTER — Encounter: Payer: Medicare Other | Attending: Registered Nurse | Admitting: Registered Nurse

## 2015-12-06 ENCOUNTER — Encounter: Payer: Self-pay | Admitting: Registered Nurse

## 2015-12-06 VITALS — BP 105/64 | HR 59

## 2015-12-06 DIAGNOSIS — M48062 Spinal stenosis, lumbar region with neurogenic claudication: Secondary | ICD-10-CM

## 2015-12-06 DIAGNOSIS — Z79899 Other long term (current) drug therapy: Secondary | ICD-10-CM | POA: Diagnosis not present

## 2015-12-06 DIAGNOSIS — M1611 Unilateral primary osteoarthritis, right hip: Secondary | ICD-10-CM | POA: Insufficient documentation

## 2015-12-06 DIAGNOSIS — G894 Chronic pain syndrome: Secondary | ICD-10-CM | POA: Diagnosis not present

## 2015-12-06 DIAGNOSIS — M1711 Unilateral primary osteoarthritis, right knee: Secondary | ICD-10-CM | POA: Insufficient documentation

## 2015-12-06 DIAGNOSIS — M222X1 Patellofemoral disorders, right knee: Secondary | ICD-10-CM | POA: Diagnosis not present

## 2015-12-06 DIAGNOSIS — Z5181 Encounter for therapeutic drug level monitoring: Secondary | ICD-10-CM

## 2015-12-06 DIAGNOSIS — M4806 Spinal stenosis, lumbar region: Secondary | ICD-10-CM | POA: Diagnosis not present

## 2015-12-06 DIAGNOSIS — M25561 Pain in right knee: Secondary | ICD-10-CM

## 2015-12-06 MED ORDER — HYDROCODONE-ACETAMINOPHEN 5-325 MG PO TABS: 1.0000 | ORAL_TABLET | Freq: Three times a day (TID) | ORAL | 0 refills | Status: DC

## 2015-12-06 NOTE — Progress Notes (Signed)
Subjective:    Patient ID: Kristopher [name], male    DOB: 12/18/1930, 82 y.o.   MRN: [id number]  HPI: [name] is a 82 year old male who returns for follow up for chronic pain and medication refill. He states his pain is located in his lower back and right knee. He rates his pain 4. His current exercise regime is walking, performing stretching exercises, mowing with self propelled mower and yard work.  Pain Inventory Average Pain 3 Pain Right Now 4 My pain is constant and throbbing pain  In the last 24 hours, has pain interfered with the following? General activity 0 Relation with others 0 Enjoyment of life 0 What TIME of day is your pain at its worst? all times Sleep (in general) Good  Pain is worse with: walking Pain improves with: rest and medication Relief from Meds: 3  Mobility walk without assistance use a cane how many minutes can you walk? 10 ability to climb steps?  yes do you drive?  yes Do you have any goals in this area?  no  Function retired  Neuro/Psych bladder control problems weakness trouble walking  Prior Studies Any changes since last visit?  no  Physicians involved in your care Any changes since last visit?  no   Family History  Problem Relation Age of Onset  . Heart disease Mother   . Heart disease Father    Social History   Social History  . Marital status: Widowed    Spouse name: N/A  . Number of children: N/A  . Years of education: GED   Occupational History  . retired    Social History Main Topics  . Smoking status: Former Smoker    Packs/day: 3.00    Years: 38.00    Quit date: 04/10/1988  . Smokeless tobacco: Never Used  . Alcohol use No  . Drug use: No  . Sexual activity: Not Asked   Other Topics Concern  . None   Social History Narrative  . None   Past Surgical History:  Procedure Laterality Date  . CATARACT EXTRACTION W/ INTRAOCULAR LENS  IMPLANT, BILATERAL  YRS AGO  . CORONARY ANGIOPLASTY WITH STENT  PLACEMENT    . [place]  . LOWER EXTREMITY ANGIOGRAM Bilateral 09/20/2011   Procedure: LOWER EXTREMITY ANGIOGRAM;  Surgeon: Wellington Hampshire, MD;  Location: [facility] CATH LAB;  Service: Cardiovascular;  Laterality: Bilateral;  . PCI of LAD  05/1996   STENT X 4 TOTAL  . TRANSURETHRAL RESECTION OF PROSTATE  08/11/2011   Procedure: TRANSURETHRAL RESECTION OF THE PROSTATE WITH GYRUS INSTRUMENTS;  Surgeon: Fredricka Bonine, MD;  Location: [facility];  Service: Urology;  Laterality: N/A;     . VASECTOMY  1968   Past Medical History:  Diagnosis Date  . AAA (abdominal aortic aneurysm) ([place])    ultrasound 07/2011:  2.7 x 2.8 cm  . Acute myocardial infarction, unspecified site, episode of care unspecified 1998  . Aortic stenosis    echo 4/12: normal LVF, mild LAE, no significant AS  . CAD (coronary artery disease)    s/p PCI of LAD 1998;  s/p DES to RCA 05/2009;  Myoview 4/13: low risk, no ischemia, EF 56%  . COPD (chronic obstructive pulmonary disease) ([place])   . DJD (degenerative joint disease), lumbar 07/06/2011  . Esophageal reflux   . HLD (hyperlipidemia)   . HTN (hypertension)   . PAD (peripheral artery disease) (HCC)    s/p L CIA  stent 09/2011 [name]  . Renal artery stenosis (HCC)    s/p bilat RA stents  . Urine incontinence    BP 105/64   Pulse (!) 59   SpO2 95%   Opioid Risk Score:   Fall Risk Score:  `1  Depression screen PHQ 2/9  Depression screen [facility] 2/9 07/27/2015 06/07/2015 01/22/2015 01/01/2015 12/04/2014 11/03/2014 07/02/2014  Decreased Interest 0 0 0 0 0 0 0  Down, Depressed, Hopeless 0 0 0 0 0 0 0  PHQ - 2 Score 0 0 0 0 0 0 0  Altered sleeping - - - - - - 0  Tired, decreased energy - - - - - - 1  Change in appetite - - - - - - 1  Feeling bad or failure about yourself  - - - - - - 0  Trouble concentrating - - - - - - 0  Moving slowly or fidgety/restless - - - - - - 0  Suicidal thoughts - - - - - - 0  PHQ-9 Score - - - - - - 2    Review of Systems  HENT:  Negative.   Eyes: Negative.   Respiratory: Negative.   Cardiovascular: Negative.   Gastrointestinal: Negative.   Endocrine: Negative.   Genitourinary: Negative.   Musculoskeletal: Negative.   Skin: Negative.   Allergic/Immunologic: Negative.   Neurological: Positive for weakness.  Hematological: Negative.   Psychiatric/Behavioral: Negative.        Objective:   Physical Exam  Constitutional: He is oriented to person, place, and time. He appears well-developed and well-nourished.  HENT:  Head: Normocephalic and atraumatic.  Neck: Normal range of motion. Neck supple.  Cardiovascular: Normal rate and regular rhythm.   Pulmonary/Chest: Effort normal and breath sounds normal.  Musculoskeletal:  Normal Muscle Bulk and Muscle Testing Reveals: Upper Extremities: Full ROM and Muscle Strength 5/5 Lower Extremities: Full ROM and Muscle Strength 5/5 Arises from table with ease using 4 prong cane for support Narrow Based Gait  Neurological: He is alert and oriented to person, place, and time.  Skin: Skin is warm and dry.  Psychiatric: He has a normal mood and affect.  Nursing note and vitals reviewed.         Assessment & Plan:  1. Patellofemoral arthritis. Continue Voltaren gel, and continue with exercise regimen.  2 Chronic Low Back Pain/ Lumbar Stenosis with radiculopathy:  3. Chronic Pain: Refilled: Hydrocodone 5/325 mg one tablet three times a day #90. We will continue the opioid monitoring program, this consists of regular clinic visits, examinations, urine drug screen, pill counts as well as use of [place] Controlled Substance reporting System.  20 minutes of face to face patient care time was spent during this visit. All questions were encouraged and answered.  F/U in 1 month

## 2016-01-02 ENCOUNTER — Other Ambulatory Visit: Payer: Self-pay | Admitting: Internal Medicine

## 2016-01-04 ENCOUNTER — Encounter: Payer: Self-pay | Admitting: Registered Nurse

## 2016-01-04 ENCOUNTER — Encounter: Payer: Medicare Other | Attending: Registered Nurse | Admitting: Registered Nurse

## 2016-01-04 ENCOUNTER — Other Ambulatory Visit: Payer: Self-pay | Admitting: Cardiology

## 2016-01-04 VITALS — BP 135/70 | HR 63

## 2016-01-04 DIAGNOSIS — G894 Chronic pain syndrome: Secondary | ICD-10-CM | POA: Diagnosis not present

## 2016-01-04 DIAGNOSIS — Z79899 Other long term (current) drug therapy: Secondary | ICD-10-CM | POA: Insufficient documentation

## 2016-01-04 DIAGNOSIS — M1711 Unilateral primary osteoarthritis, right knee: Secondary | ICD-10-CM | POA: Diagnosis not present

## 2016-01-04 DIAGNOSIS — Z5181 Encounter for therapeutic drug level monitoring: Secondary | ICD-10-CM | POA: Diagnosis not present

## 2016-01-04 DIAGNOSIS — M222X1 Patellofemoral disorders, right knee: Secondary | ICD-10-CM

## 2016-01-04 DIAGNOSIS — M1611 Unilateral primary osteoarthritis, right hip: Secondary | ICD-10-CM | POA: Insufficient documentation

## 2016-01-04 DIAGNOSIS — M48062 Spinal stenosis, lumbar region with neurogenic claudication: Secondary | ICD-10-CM

## 2016-01-04 DIAGNOSIS — M25561 Pain in right knee: Secondary | ICD-10-CM

## 2016-01-04 DIAGNOSIS — M4806 Spinal stenosis, lumbar region: Secondary | ICD-10-CM

## 2016-01-04 MED ORDER — HYDROCODONE-ACETAMINOPHEN 5-325 MG PO TABS: 1.0000 | ORAL_TABLET | Freq: Three times a day (TID) | ORAL | 0 refills | Status: DC

## 2016-01-04 NOTE — Progress Notes (Signed)
Subjective:    Patient ID: Kristopher [name], male    DOB: Jul 24, 1930, 82 y.o.   MRN: [id number]  HPI:  [name] is a 82 year old male who returns for follow up for chronic pain and medication refill. He states his pain is located in his lower back occassionally and right knee. He did not rate his pain today. His current exercise regime is walking, performing stretching exercises, mowing with self propelled mower and yard work.  Pain Inventory Average Pain 3 Pain Right Now n/a My pain is n/a  In the last 24 hours, has pain interfered with the following? General activity n/a Relation with others n/a Enjoyment of life n/a What TIME of day is your pain at its worst? n/a Sleep (in general) Fair  Pain is worse with: n/a Pain improves with: n/a Relief from Meds: 7  Mobility use a cane ability to climb steps?  no do you drive?  yes Do you have any goals in this area?  no  Function Do you have any goals in this area?  no  Neuro/Psych No problems in this area  Prior Studies Any changes since last visit?  no  Physicians involved in your care Any changes since last visit?  no   Family History  Problem Relation Age of Onset  . Heart disease Mother   . Heart disease Father    Social History   Social History  . Marital status: Widowed    Spouse name: N/A  . Number of children: N/A  . Years of education: GED   Occupational History  . retired    Social History Main Topics  . Smoking status: Former Smoker    Packs/day: 3.00    Years: 38.00    Quit date: 04/10/1988  . Smokeless tobacco: Never Used  . Alcohol use No  . Drug use: No  . Sexual activity: Not on file   Other Topics Concern  . Not on file   Social History Narrative  . No narrative on file   Past Surgical History:  Procedure Laterality Date  . CATARACT EXTRACTION W/ INTRAOCULAR LENS  IMPLANT, BILATERAL  YRS AGO  . CORONARY ANGIOPLASTY WITH STENT PLACEMENT    . [place]  .  LOWER EXTREMITY ANGIOGRAM Bilateral 09/20/2011   Procedure: LOWER EXTREMITY ANGIOGRAM;  Surgeon: Wellington Hampshire, MD;  Location: [facility] CATH LAB;  Service: Cardiovascular;  Laterality: Bilateral;  . PCI of LAD  05/1996   STENT X 4 TOTAL  . TRANSURETHRAL RESECTION OF PROSTATE  08/11/2011   Procedure: TRANSURETHRAL RESECTION OF THE PROSTATE WITH GYRUS INSTRUMENTS;  Surgeon: Fredricka Bonine, MD;  [facility];  Service: Urology;  Laterality: N/A;     . VASECTOMY  1968   Past Medical History:  Diagnosis Date  . AAA (abdominal aortic aneurysm) ([place])    ultrasound 07/2011:  2.7 x 2.8 cm  . Acute myocardial infarction, unspecified site, episode of care unspecified 1998  . Aortic stenosis    echo 4/12: normal LVF, mild LAE, no significant AS  . CAD (coronary artery disease)    s/p PCI of LAD 1998;  s/p DES to RCA 05/2009;  Myoview 4/13: low risk, no ischemia, EF 56%  . COPD (chronic obstructive pulmonary disease) ([place])   . DJD (degenerative joint disease), lumbar 07/06/2011  . Esophageal reflux   . HLD (hyperlipidemia)   . HTN (hypertension)   . PAD (peripheral artery disease) ([facility])    s/p  L CIA stent 09/2011 [name]  . Renal artery stenosis ([facility])    s/p bilat RA stents  . Urine incontinence    There were no vitals taken for this visit.  Opioid Risk Score:   Fall Risk Score:  `1  Depression screen PHQ 2/9  Depression screen [facility] 2/9 07/27/2015 06/07/2015 01/22/2015 01/01/2015 12/04/2014 11/03/2014 07/02/2014  Decreased Interest 0 0 0 0 0 0 0  Down, Depressed, Hopeless 0 0 0 0 0 0 0  PHQ - 2 Score 0 0 0 0 0 0 0  Altered sleeping - - - - - - 0  Tired, decreased energy - - - - - - 1  Change in appetite - - - - - - 1  Feeling bad or failure about yourself  - - - - - - 0  Trouble concentrating - - - - - - 0  Moving slowly or fidgety/restless - - - - - - 0  Suicidal thoughts - - - - - - 0  PHQ-9 Score - - - - - - 2    Review of Systems  All other systems reviewed and are  negative.      Objective:   Physical Exam  Constitutional: He is oriented to person, place, and time. He appears well-developed and well-nourished.  HENT:  Head: Normocephalic and atraumatic.  Neck: Normal range of motion. Neck supple.  Cardiovascular: Normal rate and regular rhythm.   Pulmonary/Chest: Effort normal and breath sounds normal.  Musculoskeletal:  Normal Muscle Bulk and Muscle Testing Reveals: Upper Extremities: Full ROM and Muscle Strength 5/5 Lower Extremities: Full ROM and Muscle Strength 5/5 Right Lower Extremity Flexion Produces Pain into Patella Arises from chair slowly using 4 prong cane for support Narrow Based Gait  Neurological: He is alert and oriented to person, place, and time.  Skin: Skin is warm and dry.  Psychiatric: He has a normal mood and affect.  Nursing note and vitals reviewed.         Assessment & Plan:  1. Patellofemoral arthritis. Continue Voltaren gel, and continue with exercise regimen.  2 Chronic Low Back Pain/ Lumbar Stenosis with radiculopathy:  3. Chronic Pain: Refilled: Hydrocodone 5/325 mg one tablet three times a day #90. We will continue the opioid monitoring program, this consists of regular clinic visits, examinations, urine drug screen, pill counts as well as use of [place] Controlled Substance reporting System.  20 minutes of face to face patient care time was spent during this visit. All questions were encouraged and answered.  F/U in 1 month

## 2016-01-23 ENCOUNTER — Other Ambulatory Visit: Payer: Self-pay | Admitting: Cardiology

## 2016-01-25 ENCOUNTER — Ambulatory Visit: Payer: Medicare Other | Admitting: Internal Medicine

## 2016-01-26 ENCOUNTER — Ambulatory Visit (INDEPENDENT_AMBULATORY_CARE_PROVIDER_SITE_OTHER): Payer: Medicare Other | Admitting: Internal Medicine

## 2016-01-26 ENCOUNTER — Encounter: Payer: Self-pay | Admitting: Internal Medicine

## 2016-01-26 ENCOUNTER — Other Ambulatory Visit (INDEPENDENT_AMBULATORY_CARE_PROVIDER_SITE_OTHER): Payer: Medicare Other

## 2016-01-26 VITALS — BP 140/80 | HR 90 | Temp 98.1°F | Resp 20 | Wt 150.2 lb

## 2016-01-26 DIAGNOSIS — J439 Emphysema, unspecified: Secondary | ICD-10-CM | POA: Diagnosis not present

## 2016-01-26 DIAGNOSIS — E785 Hyperlipidemia, unspecified: Secondary | ICD-10-CM | POA: Diagnosis not present

## 2016-01-26 DIAGNOSIS — I10 Essential (primary) hypertension: Secondary | ICD-10-CM

## 2016-01-26 DIAGNOSIS — Z23 Encounter for immunization: Secondary | ICD-10-CM | POA: Diagnosis not present

## 2016-01-26 LAB — BASIC METABOLIC PANEL
BUN: 31 mg/dL — ABNORMAL HIGH (ref 6–23)
CO2: 27 mEq/L (ref 19–32)
Calcium: 9.6 mg/dL (ref 8.4–10.5)
Chloride: 108 mEq/L (ref 96–112)
Creatinine, Ser: 1.22 mg/dL (ref 0.40–1.50)
GFR: 60.24 mL/min (ref >60.00)
Glucose, Bld: 90 mg/dL (ref 70–99)
Potassium: 5.3 mEq/L — ABNORMAL HIGH (ref 3.5–5.1)
Sodium: 141 mEq/L (ref 135–145)

## 2016-01-26 LAB — URINALYSIS, ROUTINE W REFLEX MICROSCOPIC
Bilirubin Urine: NEGATIVE
Hgb urine dipstick: NEGATIVE
Ketones, ur: NEGATIVE
Leukocytes, UA: NEGATIVE
Nitrite: NEGATIVE
RBC / HPF: NONE SEEN (ref 0–?)
Specific Gravity, Urine: 1.01 (ref 1.000–1.030)
Total Protein, Urine: NEGATIVE
Urine Glucose: NEGATIVE
Urobilinogen, UA: 0.2 (ref 0.0–1.0)
pH: 5.5 (ref 5.0–8.0)

## 2016-01-26 LAB — CBC WITH DIFFERENTIAL/PLATELET
Basophils Absolute: 0.1 10*3/uL (ref 0.0–0.1)
Basophils Relative: 0.6 % (ref 0.0–3.0)
Eosinophils Absolute: 0.5 10*3/uL (ref 0.0–0.7)
Eosinophils Relative: 4.9 % (ref 0.0–5.0)
HCT: 40.7 % (ref 39.0–52.0)
Hemoglobin: 13.9 g/dL (ref 13.0–17.0)
Lymphocytes Relative: 30.8 % (ref 12.0–46.0)
Lymphs Abs: 3.1 10*3/uL (ref 0.7–4.0)
MCHC: 34.1 g/dL (ref 30.0–36.0)
MCV: 91.1 fl (ref 78.0–100.0)
Monocytes Absolute: 0.8 10*3/uL (ref 0.1–1.0)
Monocytes Relative: 8.5 % (ref 3.0–12.0)
Neutro Abs: 5.5 10*3/uL (ref 1.4–7.7)
Neutrophils Relative %: 55.2 % (ref 43.0–77.0)
Platelets: 232 10*3/uL (ref 150.0–400.0)
RBC: 4.47 Mil/uL (ref 4.22–5.81)
RDW: 15.3 % (ref 11.5–15.5)
WBC: 10 10*3/uL (ref 4.0–10.5)

## 2016-01-26 LAB — LIPID PANEL
Cholesterol: 136 mg/dL (ref 0–200)
HDL: 47.2 mg/dL (ref >39.00)
LDL Cholesterol: 75 mg/dL (ref 0–99)
NonHDL: 88.98
Total CHOL/HDL Ratio: 3
Triglycerides: 69 mg/dL (ref 0.0–149.0)
VLDL: 13.8 mg/dL (ref 0.0–40.0)

## 2016-01-26 LAB — HEPATIC FUNCTION PANEL
ALT: 22 U/L (ref 0–53)
AST: 26 U/L (ref 0–37)
Albumin: 4.3 g/dL (ref 3.5–5.2)
Alkaline Phosphatase: 103 U/L (ref 39–117)
Bilirubin, Direct: 0 mg/dL (ref 0.0–0.3)
Total Bilirubin: 0.4 mg/dL (ref 0.2–1.2)
Total Protein: 7.3 g/dL (ref 6.0–8.3)

## 2016-01-26 LAB — TSH: TSH: 3.38 u[IU]/mL (ref 0.35–4.50)

## 2016-01-26 NOTE — Progress Notes (Signed)
Pre visit review using our clinic review tool, if applicable. No additional management support is needed unless otherwise documented below in the visit note. 

## 2016-01-26 NOTE — Progress Notes (Signed)
Subjective:    Patient ID: Kristopher [name], male    DOB: Dec 11, 1930, 82 y.o.   MRN: [id number]  HPI  Here for yearly f/u;  Overall doing ok;  Pt denies Chest pain, worsening SOB, DOE, wheezing, orthopnea, PND, worsening LE edema, palpitations, dizziness or syncope.  Pt denies neurological change such as new headache, facial or extremity weakness.  Pt denies polydipsia, polyuria, or low sugar symptoms. Pt states overall good compliance with treatment and medications, good tolerability, and has been trying to follow appropriate diet.  Pt denies worsening depressive symptoms, suicidal ideation or panic. No fever, night sweats, wt loss, loss of appetite, or other constitutional symptoms.  Pt states good ability with ADL's, has low fall risk, home safety reviewed and adequate, no other significant changes in hearing or vision, no change from previous history. Due for flu shot and pneumovax, but declines tetanus. Past Medical History:  Diagnosis Date  . AAA (abdominal aortic aneurysm) ([place])    ultrasound 07/2011:  2.7 x 2.8 cm  . Acute myocardial infarction, unspecified site, episode of care unspecified 1998  . Aortic stenosis    echo 4/12: normal LVF, mild LAE, no significant AS  . CAD (coronary artery disease)    s/p PCI of LAD 1998;  s/p DES to RCA 05/2009;  Myoview 4/13: low risk, no ischemia, EF 56%  . COPD (chronic obstructive pulmonary disease) ([place])   . DJD (degenerative joint disease), lumbar 07/06/2011  . Esophageal reflux   . HLD (hyperlipidemia)   . HTN (hypertension)   . PAD (peripheral artery disease) ([place])    s/p L CIA stent 09/2011 ([place])  . Renal artery stenosis ([facility])    s/p bilat RA stents  . Urine incontinence    Past Surgical History:  Procedure Laterality Date  . CATARACT EXTRACTION W/ INTRAOCULAR LENS  IMPLANT, BILATERAL  YRS AGO  . CORONARY ANGIOPLASTY WITH STENT PLACEMENT    . [place]  . LOWER EXTREMITY ANGIOGRAM Bilateral 09/20/2011   Procedure: LOWER  EXTREMITY ANGIOGRAM;  Surgeon: Wellington Hampshire, MD;  Location: [facility] CATH LAB;  Service: Cardiovascular;  Laterality: Bilateral;  . PCI of LAD  05/1996   STENT X 4 TOTAL  . TRANSURETHRAL RESECTION OF PROSTATE  08/11/2011   Procedure: TRANSURETHRAL RESECTION OF THE PROSTATE WITH GYRUS INSTRUMENTS;  Surgeon: Fredricka Bonine, MD;  [facility];  Service: Urology;  Laterality: N/A;     . VASECTOMY  1968    reports that he quit smoking about 27 years ago. He has a 114.00 pack-year smoking history. He has never used smokeless tobacco. He reports that he does not drink alcohol or use drugs. family history includes Heart disease in his father and mother. Allergies  Allergen Reactions  . Influenza Vaccines Other (See Comments)    unknown  . Sulfa Antibiotics Rash   Current Outpatient Prescriptions on File Prior to Visit  Medication Sig Dispense Refill  . Ascorbic Acid (VITAMIN C) 500 MG tablet Take 500 mg by mouth 2 (two) times daily.     Marland Kitchen aspirin 325 MG EC tablet Take 325 mg by mouth daily.    Marland Kitchen atorvastatin (LIPITOR) 80 MG tablet TAKE 1 TABLET DAILY 90 tablet 1  . BEE POLLEN PO Take by mouth.    Marland Kitchen Co-Enzyme Q-10 100 MG CAPS Take 1 capsule by mouth daily.    . diclofenac sodium (VOLTAREN) 1 % GEL Apply 2 g topically 4 (four) times daily. 9 Tube 1  .  fish oil-omega-3 fatty acids 1000 MG capsule Take 1 g by mouth daily.     Marland Kitchen HYDROcodone-acetaminophen (NORCO/VICODIN) 5-325 MG tablet Take 1 tablet by mouth 3 (three) times daily before meals. 90 tablet 0  . Multiple Vitamin (MULTIVITAMIN) capsule Take 1 capsule by mouth daily.     . Multiple Vitamins-Minerals (PRESERVISION AREDS PO) Take 2 tablets by mouth daily.    . nitroGLYCERIN (NITROSTAT) 0.4 MG SL tablet Place 1 tablet (0.4 mg total) under the tongue every 5 (five) minutes as needed for chest pain. 90 tablet 3  . omeprazole (PRILOSEC) 20 MG capsule TAKE 2 CAPSULES DAILY 180 capsule 0  . ramipril (ALTACE) 10 MG capsule Take 1 capsule  (10 mg total) by mouth daily. 90 capsule 0   No current facility-administered medications on file prior to visit.    Review of Systems  Constitutional: Negative for unusual diaphoresis or night sweats HENT: Negative for ear swelling or discharge Eyes: Negative for worsening visual haziness  Respiratory: Negative for choking and stridor.   Gastrointestinal: Negative for distension or worsening eructation Genitourinary: Negative for retention or change in urine volume.  Musculoskeletal: Negative for other MSK pain or swelling Skin: Negative for color change and worsening wound Neurological: Negative for tremors and numbness other than noted  Psychiatric/Behavioral: Negative for decreased concentration or agitation other than above   All o/w neg per pt    Objective:   Physical Exam BP 140/80   Pulse 90   Temp 98.1 F (36.7 C) (Oral)   Resp 20   Wt 150 lb 4 oz (68.2 kg)   SpO2 90%   BMI 22.19 kg/m  VS noted, thin, somewhat frail Constitutional: Pt is oriented to person, place, and time. Appears well-developed and well-nourished, in no significant distress Head: Normocephalic and atraumatic  Eyes: Conjunctivae and EOM are normal. Pupils are equal, round, and reactive to light Right Ear: External ear normal.  Left Ear: External ear normal Nose: Nose normal.  Mouth/Throat: Oropharynx is clear and moist  Neck: Normal range of motion. Neck supple. No JVD present. No tracheal deviation present or significant neck LA or mass Cardiovascular: Normal rate, regular rhythm, normal heart sounds and intact distal pulses.   Pulmonary/Chest: Effort normal and breath sounds decreased bilat without rales or wheezing  Abdominal: Soft. Bowel sounds are normal. NT. No HSM  Musculoskeletal: Normal range of motion. Exhibits no edema Lymphadenopathy: Has no cervical adenopathy.  Neurological: Pt is alert and oriented to person, place, and time. Pt has normal reflexes. No cranial nerve deficit. Motor  grossly intact Skin: Skin is warm and dry. No rash noted or new ulcers Psychiatric:  Has normal mood and affect. Behavior is normal.   Assessment & Plan:

## 2016-01-26 NOTE — Patient Instructions (Addendum)
You had the pneumovax pneumonia shot today  Please continue all other medications as before, and refills have been done if requested.  Please have the pharmacy call with any other refills you may need.  Please continue your efforts at being more active, low cholesterol diet, and weight control.  You are otherwise up to date with prevention measures today.  Please keep your appointments with your specialists as you may have planned  Please go to the LAB in the Basement (turn left off the elevator) for the tests to be done today  You will be contacted by phone if any changes need to be made immediately.  Otherwise, you will receive a letter about your results with an explanation, but please check with MyChart first.  Please remember to sign up for MyChart if you have not done so, as this will be important to you in the future with finding out test results, communicating by private email, and scheduling acute appointments online when needed.  Please return in 1 year for your yearly visit, or sooner if needed 

## 2016-01-30 NOTE — Assessment & Plan Note (Signed)
stable overall by history and exam, recent data reviewed with pt, and pt to continue medical treatment as before,  to f/u any worsening symptoms or concerns @LASTSAO2(3)@  

## 2016-01-30 NOTE — Assessment & Plan Note (Signed)
stable overall by history and exam, recent data reviewed with pt, and pt to continue medical treatment as before,  to f/u any worsening symptoms or concerns BP Readings from Last 3 Encounters:  01/26/16 140/80  01/04/16 135/70  12/06/15 105/64

## 2016-01-30 NOTE — Assessment & Plan Note (Signed)
stable overall by history and exam, recent data reviewed with pt, and pt to continue medical treatment as before,  to f/u any worsening symptoms or concerns Lab Results  Component Value Date   LDLCALC 75 01/26/2016

## 2016-02-08 ENCOUNTER — Encounter: Payer: Medicare Other | Attending: Registered Nurse | Admitting: Registered Nurse

## 2016-02-08 ENCOUNTER — Encounter: Payer: Self-pay | Admitting: Registered Nurse

## 2016-02-08 ENCOUNTER — Encounter (INDEPENDENT_AMBULATORY_CARE_PROVIDER_SITE_OTHER): Payer: Self-pay

## 2016-02-08 VITALS — BP 117/75 | HR 73 | Resp 14

## 2016-02-08 DIAGNOSIS — M1711 Unilateral primary osteoarthritis, right knee: Secondary | ICD-10-CM | POA: Diagnosis not present

## 2016-02-08 DIAGNOSIS — G894 Chronic pain syndrome: Secondary | ICD-10-CM | POA: Insufficient documentation

## 2016-02-08 DIAGNOSIS — M48062 Spinal stenosis, lumbar region with neurogenic claudication: Secondary | ICD-10-CM

## 2016-02-08 DIAGNOSIS — M1611 Unilateral primary osteoarthritis, right hip: Secondary | ICD-10-CM | POA: Insufficient documentation

## 2016-02-08 DIAGNOSIS — Z79899 Other long term (current) drug therapy: Secondary | ICD-10-CM | POA: Diagnosis not present

## 2016-02-08 DIAGNOSIS — Z5181 Encounter for therapeutic drug level monitoring: Secondary | ICD-10-CM | POA: Insufficient documentation

## 2016-02-08 DIAGNOSIS — M25561 Pain in right knee: Secondary | ICD-10-CM | POA: Diagnosis not present

## 2016-02-08 MED ORDER — HYDROCODONE-ACETAMINOPHEN 5-325 MG PO TABS: 1.0000 | ORAL_TABLET | Freq: Three times a day (TID) | ORAL | 0 refills | Status: DC

## 2016-02-08 NOTE — Addendum Note (Signed)
Addended by: Elinor Parkinson I on: 02/08/2016 02:10 PM   Modules accepted: Orders

## 2016-02-08 NOTE — Progress Notes (Signed)
Subjective:    Patient ID: Kristopher [name], male    DOB: Feb 22, 1931, 82 y.o.   MRN: [id number]  HPI: [name] is a 82year old male who returns for follow up for chronic pain and medication refill. He states his pain is located in his lower back with activity and right knee. He ratehis pain 2. His current exercise regime is walking, performing stretching exercises, mowing with self propelled mowerandyard work.  Pain Inventory Average Pain 3 Pain Right Now 2 My pain is intermittent, burning and dull  In the last 24 hours, has pain interfered with the following? General activity 1 Relation with others 0 Enjoyment of life 0 What TIME of day is your pain at its worst? evening Sleep (in general) Good  Pain is worse with: bending Pain improves with: rest Relief from Meds: 8  Mobility use a cane ability to climb steps?  yes do you drive?  yes  Function retired  Neuro/Psych trouble walking  Prior Studies Any changes since last visit?  no  Physicians involved in your care Any changes since last visit?  no   Family History  Problem Relation Age of Onset  . Heart disease Mother   . Heart disease Father    Social History   Social History  . Marital status: Widowed    Spouse name: N/A  . Number of children: N/A  . Years of education: GED   Occupational History  . retired    Social History Main Topics  . Smoking status: Former Smoker    Packs/day: 3.00    Years: 38.00    Quit date: 04/10/1988  . Smokeless tobacco: Never Used  . Alcohol use No  . Drug use: No  . Sexual activity: Not Asked   Other Topics Concern  . None   Social History Narrative  . None   Past Surgical History:  Procedure Laterality Date  . CATARACT EXTRACTION W/ INTRAOCULAR LENS  IMPLANT, BILATERAL  YRS AGO  . CORONARY ANGIOPLASTY WITH STENT PLACEMENT    . [place]  . LOWER EXTREMITY ANGIOGRAM Bilateral 09/20/2011   Procedure: LOWER EXTREMITY ANGIOGRAM;   Surgeon: Wellington Hampshire, MD;  Location: [facility];  Service: Cardiovascular;  Laterality: Bilateral;  . PCI of LAD  05/1996   STENT X 4 TOTAL  . TRANSURETHRAL RESECTION OF PROSTATE  08/11/2011   Procedure: TRANSURETHRAL RESECTION OF THE PROSTATE WITH GYRUS INSTRUMENTS;  Surgeon: Fredricka Bonine, MD;  Location: [facility];  Service: Urology;  Laterality: N/A;     . VASECTOMY  1968   Past Medical History:  Diagnosis Date  . AAA (abdominal aortic aneurysm) ([place])    ultrasound 07/2011:  2.7 x 2.8 cm  . Acute myocardial infarction, unspecified site, episode of care unspecified 1998  . Aortic stenosis    echo 4/12: normal LVF, mild LAE, no significant AS  . CAD (coronary artery disease)    s/p PCI of LAD 1998;  s/p DES to RCA 05/2009;  Myoview 4/13: low risk, no ischemia, EF 56%  . COPD (chronic obstructive pulmonary disease) ([place])   . DJD (degenerative joint disease), lumbar 07/06/2011  . Esophageal reflux   . HLD (hyperlipidemia)   . HTN (hypertension)   . PAD (peripheral artery disease) ([place])    s/p L CIA stent 09/2011 ([place])  . Renal artery stenosis ([facility])    s/p bilat RA stents  . Urine incontinence    BP 117/75   Pulse 73  Resp 14   SpO2 97%   Opioid Risk Score:   Fall Risk Score:  `1  Depression screen PHQ 2/9  Depression screen [facility] 2/9 01/26/2016 07/27/2015 06/07/2015 01/22/2015 01/01/2015 12/04/2014 11/03/2014  Decreased Interest 0 0 0 0 0 0 0  Down, Depressed, Hopeless 0 0 0 0 0 0 0  PHQ - 2 Score 0 0 0 0 0 0 0  Altered sleeping - - - - - - -  Tired, decreased energy - - - - - - -  Change in appetite - - - - - - -  Feeling bad or failure about yourself  - - - - - - -  Trouble concentrating - - - - - - -  Moving slowly or fidgety/restless - - - - - - -  Suicidal thoughts - - - - - - -  PHQ-9 Score - - - - - - -    Review of Systems  All other systems reviewed and are negative.      Objective:   Physical Exam  Constitutional: He is oriented to person, place,  and time. He appears well-developed and well-nourished.  HENT:  Head: Normocephalic and atraumatic.  Neck: Normal range of motion. Neck supple.  Cardiovascular: Normal rate and regular rhythm.   Pulmonary/Chest: Effort normal and breath sounds normal.  Musculoskeletal:  Normal Muscle Bulk and Muscle Testing Reveals: Upper Extremities: Full ROM and Muscle Strength 5/5 Back without spinal tenderness noted Lower Extremities: Full ROM and Muscle Strength 5/5 Right Lower Extremity Flexion Produces Pain into upper extremity  Arises from Table with ease using 4 prong cane with support Narrow Based Gait   Neurological: He is alert and oriented to person, place, and time.  Skin: Skin is warm and dry.  Psychiatric: He has a normal mood and affect.  Nursing note and vitals reviewed.         Assessment & Plan:  1. Patellofemoral arthritis. Continue Voltaren gel, and continue with exercise regimen.  2 Chronic Low Back Pain/ Lumbar Stenosis with radiculopathy:  3. Chronic Pain: Refilled: Hydrocodone 5/325 mg one tablet three times a day #90. We will continue the opioid monitoring program, this consists of regular clinic visits, examinations, urine drug screen, pill counts as well as use of [place] Controlled Substance reporting System.  20 minutes of face to face patient care time was spent during this visit. All questions were encouraged and answered.  F/U in 5 weeks

## 2016-02-15 LAB — TOXASSURE SELECT,+ANTIDEPR,UR

## 2016-02-16 NOTE — Progress Notes (Signed)
Urine drug screen for this encounter is consistent for prescribed medication 

## 2016-03-14 ENCOUNTER — Encounter: Payer: Medicare Other | Attending: Registered Nurse | Admitting: Registered Nurse

## 2016-03-14 ENCOUNTER — Encounter: Payer: Self-pay | Admitting: Registered Nurse

## 2016-03-14 VITALS — BP 101/62 | HR 75

## 2016-03-14 DIAGNOSIS — M1611 Unilateral primary osteoarthritis, right hip: Secondary | ICD-10-CM | POA: Insufficient documentation

## 2016-03-14 DIAGNOSIS — M25561 Pain in right knee: Secondary | ICD-10-CM

## 2016-03-14 DIAGNOSIS — G894 Chronic pain syndrome: Secondary | ICD-10-CM | POA: Insufficient documentation

## 2016-03-14 DIAGNOSIS — Z5181 Encounter for therapeutic drug level monitoring: Secondary | ICD-10-CM | POA: Diagnosis not present

## 2016-03-14 DIAGNOSIS — Z79899 Other long term (current) drug therapy: Secondary | ICD-10-CM | POA: Diagnosis not present

## 2016-03-14 DIAGNOSIS — M1711 Unilateral primary osteoarthritis, right knee: Secondary | ICD-10-CM | POA: Insufficient documentation

## 2016-03-14 DIAGNOSIS — M48062 Spinal stenosis, lumbar region with neurogenic claudication: Secondary | ICD-10-CM | POA: Diagnosis not present

## 2016-03-14 MED ORDER — HYDROCODONE-ACETAMINOPHEN 5-325 MG PO TABS: 1.0000 | ORAL_TABLET | Freq: Three times a day (TID) | ORAL | 0 refills | Status: DC

## 2016-03-14 NOTE — Progress Notes (Signed)
Subjective:    Patient ID: Kristopher [name], male    DOB: 30-Nov-1930, 82 y.o.   MRN: [id number]  HPI: Kristopher [name] is a 82year old male who returns for follow up for chronic pain and medication refill. He states his pain is located in his lower back with activityand right knee. Heratehis pain 3. His current exercise regime is walking, performing stretching exercises, mowing with self propelled mowerandyard work.  Pain Inventory Average Pain 3 Pain Right Now 3 My pain is intermittent, sharp, burning, dull, stabbing and aching  In the last 24 hours, has pain interfered with the following? General activity 5 Relation with others 5 Enjoyment of life 5 What TIME of day is your pain at its worst? daytime Sleep (in general) Fair  Pain is worse with: some activites Pain improves with: rest and medication Relief from Meds: 5  Mobility use a cane ability to climb steps?  yes do you drive?  yes  Function retired  Neuro/Psych No problems in this area  Prior Studies Any changes since last visit?  no  Physicians involved in your care Any changes since last visit?  no   Family History  Problem Relation Age of Onset  . Heart disease Mother   . Heart disease Father    Social History   Social History  . Marital status: Widowed    Spouse name: N/A  . Number of children: N/A  . Years of education: GED   Occupational History  . retired    Social History Main Topics  . Smoking status: Former Smoker    Packs/day: 3.00    Years: 38.00    Quit date: 04/10/1988  . Smokeless tobacco: Never Used  . Alcohol use No  . Drug use: No  . Sexual activity: Not on file   Other Topics Concern  . Not on file   Social History Narrative  . No narrative on file   Past Surgical History:  Procedure Laterality Date  . CATARACT EXTRACTION W/ INTRAOCULAR LENS  IMPLANT, BILATERAL  YRS AGO  . CORONARY ANGIOPLASTY WITH STENT PLACEMENT    . [place]  . LOWER  EXTREMITY ANGIOGRAM Bilateral 09/20/2011   Procedure: LOWER EXTREMITY ANGIOGRAM;  Surgeon: Wellington Hampshire, MD;  Location: [place] [facility];  Service: Cardiovascular;  Laterality: Bilateral;  . PCI of LAD  05/1996   STENT X 4 TOTAL  . TRANSURETHRAL RESECTION OF PROSTATE  08/11/2011   Procedure: TRANSURETHRAL RESECTION OF THE PROSTATE WITH GYRUS INSTRUMENTS;  Surgeon: Fredricka Bonine, MD;  Location: [facility];  Service: Urology;  Laterality: N/A;     . VASECTOMY  1968   Past Medical History:  Diagnosis Date  . AAA (abdominal aortic aneurysm) ([place])    ultrasound 07/2011:  2.7 x 2.8 cm  . Acute myocardial infarction, unspecified site, episode of care unspecified 1998  . Aortic stenosis    echo 4/12: normal LVF, mild LAE, no significant AS  . CAD (coronary artery disease)    s/p PCI of LAD 1998;  s/p DES to RCA 05/2009;  Myoview 4/13: low risk, no ischemia, EF 56%  . COPD (chronic obstructive pulmonary disease) ([place])   . DJD (degenerative joint disease), lumbar 07/06/2011  . Esophageal reflux   . HLD (hyperlipidemia)   . HTN (hypertension)   . PAD (peripheral artery disease) ([place])    s/p L CIA stent 09/2011 ([place])  . Renal artery stenosis ([facility])    s/p bilat RA stents  .  Urine incontinence    There were no vitals taken for this visit.  Opioid Risk Score:   Fall Risk Score:  `1  Depression screen PHQ 2/9  Depression screen [facility] 2/9 01/26/2016 07/27/2015 06/07/2015 01/22/2015 01/01/2015 12/04/2014 11/03/2014  Decreased Interest 0 0 0 0 0 0 0  Down, Depressed, Hopeless 0 0 0 0 0 0 0  PHQ - 2 Score 0 0 0 0 0 0 0  Altered sleeping - - - - - - -  Tired, decreased energy - - - - - - -  Change in appetite - - - - - - -  Feeling bad or failure about yourself  - - - - - - -  Trouble concentrating - - - - - - -  Moving slowly or fidgety/restless - - - - - - -  Suicidal thoughts - - - - - - -  PHQ-9 Score - - - - - - -   Review of Systems  Constitutional: Negative.   HENT: Negative.   Eyes:  Negative.   Respiratory: Negative.   Cardiovascular: Negative.   Gastrointestinal: Negative.   Endocrine: Negative.   Genitourinary: Negative.   Musculoskeletal: Positive for gait problem and joint swelling.  Skin: Negative.   Allergic/Immunologic: Negative.   Hematological: Negative.   Psychiatric/Behavioral: Negative.   All other systems reviewed and are negative.      Objective:   Physical Exam  Constitutional: He is oriented to person, place, and time. He appears well-developed and well-nourished.  HENT:  Head: Normocephalic and atraumatic.  Neck: Normal range of motion. Neck supple.  Cardiovascular: Normal rate and regular rhythm.   Pulmonary/Chest: Effort normal and breath sounds normal.  Musculoskeletal:  Normal Muscle Bulk and Muscle Testing Reveals: Upper Extremities: Full ROM and Muscle Strength 5/5 Lower Extremities: Full ROM and Muscle Strength 5/5 Right Lower Extremity Flexion Produces Pain into Patella Arises from Table with ease using 4 prong cane for support Narrow Based Gait  Neurological: He is alert and oriented to person, place, and time.  Skin: Skin is warm and dry.  Psychiatric: He has a normal mood and affect.  Nursing note and vitals reviewed.         Assessment & Plan:  1. Patellofemoral arthritis. Continue Voltaren gel, and continue with exercise regimen.  2 Chronic Low Back Pain/ Lumbar Stenosis with radiculopathy:  3. Chronic Pain: Refilled: Hydrocodone 5/325 mg one tablet three times a day #90. We will continue the opioid monitoring program, this consists of regular clinic visits, examinations, urine drug screen, pill counts as well as use of [place] Controlled Substance reporting System.  20 minutes of face to face patient care time was spent during this visit. All questions were encouraged and answered.  F/U in 5 weeks

## 2016-04-02 ENCOUNTER — Other Ambulatory Visit: Payer: Self-pay | Admitting: Internal Medicine

## 2016-04-03 ENCOUNTER — Other Ambulatory Visit: Payer: Self-pay | Admitting: Cardiology

## 2016-04-04 NOTE — Telephone Encounter (Signed)
REFILL 

## 2016-04-18 ENCOUNTER — Encounter: Payer: Self-pay | Admitting: Registered Nurse

## 2016-04-18 ENCOUNTER — Encounter: Payer: Medicare Other | Attending: Registered Nurse | Admitting: Registered Nurse

## 2016-04-18 VITALS — BP 157/93 | HR 60

## 2016-04-18 DIAGNOSIS — G894 Chronic pain syndrome: Secondary | ICD-10-CM

## 2016-04-18 DIAGNOSIS — M25561 Pain in right knee: Secondary | ICD-10-CM

## 2016-04-18 DIAGNOSIS — Z79899 Other long term (current) drug therapy: Secondary | ICD-10-CM | POA: Diagnosis not present

## 2016-04-18 DIAGNOSIS — M48062 Spinal stenosis, lumbar region with neurogenic claudication: Secondary | ICD-10-CM

## 2016-04-18 DIAGNOSIS — Z5181 Encounter for therapeutic drug level monitoring: Secondary | ICD-10-CM | POA: Diagnosis not present

## 2016-04-18 DIAGNOSIS — M1611 Unilateral primary osteoarthritis, right hip: Secondary | ICD-10-CM | POA: Diagnosis not present

## 2016-04-18 DIAGNOSIS — M1711 Unilateral primary osteoarthritis, right knee: Secondary | ICD-10-CM | POA: Diagnosis not present

## 2016-04-18 MED ORDER — HYDROCODONE-ACETAMINOPHEN 5-325 MG PO TABS: 1.0000 | ORAL_TABLET | Freq: Three times a day (TID) | ORAL | 0 refills | Status: DC

## 2016-04-18 NOTE — Progress Notes (Signed)
Subjective:    Patient ID: Kristopher [name], male    DOB: 1930/04/25, 83 y.o.   MRN: [id number]  HPI:  Kristopher [name] is a 83year old male who returns for follow up appointment for chronic pain and medication refill. He states his pain is located in his lower back with activityand right knee. Heratehis pain 2. His current exercise regime is walking and performing stretching exercises  Pain Inventory Average Pain 3 Pain Right Now 2 My pain is .  In the last 24 hours, has pain interfered with the following? General activity . Relation with others . Enjoyment of life . What TIME of day is your pain at its worst? . Sleep (in general) .  Pain is worse with: unsure Pain improves with: rest and medication Relief from Meds: .  Mobility walk without assistance use a cane ability to climb steps?  yes do you drive?  yes  Function retired  Neuro/Psych No problems in this area  Prior Studies Any changes since last visit?  no  Physicians involved in your care Any changes since last visit?  no   Family History  Problem Relation Age of Onset  . Heart disease Mother   . Heart disease Father    Social History   Social History  . Marital status: Widowed    Spouse name: N/A  . Number of children: N/A  . Years of education: GED   Occupational History  . retired    Social History Main Topics  . Smoking status: Former Smoker    Packs/day: 3.00    Years: 38.00    Quit date: 04/10/1988  . Smokeless tobacco: Never Used  . Alcohol use No  . Drug use: No  . Sexual activity: Not on file   Other Topics Concern  . Not on file   Social History Narrative  . No narrative on file   Past Surgical History:  Procedure Laterality Date  . CATARACT EXTRACTION W/ INTRAOCULAR LENS  IMPLANT, BILATERAL  YRS AGO  . CORONARY ANGIOPLASTY WITH STENT PLACEMENT    . [place]  . LOWER EXTREMITY ANGIOGRAM Bilateral 09/20/2011   Procedure: LOWER EXTREMITY ANGIOGRAM;   Surgeon: Wellington Hampshire, MD;  Location: [place] [facility];  Service: Cardiovascular;  Laterality: Bilateral;  . PCI of LAD  05/1996   STENT X 4 TOTAL  . TRANSURETHRAL RESECTION OF PROSTATE  08/11/2011   Procedure: TRANSURETHRAL RESECTION OF THE PROSTATE WITH GYRUS INSTRUMENTS;  Surgeon: Fredricka Bonine, MD;  Location: [facility];  Service: Urology;  Laterality: N/A;     . VASECTOMY  1968   Past Medical History:  Diagnosis Date  . AAA (abdominal aortic aneurysm) ([place])    ultrasound 07/2011:  2.7 x 2.8 cm  . Acute myocardial infarction, unspecified site, episode of care unspecified 1998  . Aortic stenosis    echo 4/12: normal LVF, mild LAE, no significant AS  . CAD (coronary artery disease)    s/p PCI of LAD 1998;  s/p DES to RCA 05/2009;  Myoview 4/13: low risk, no ischemia, EF 56%  . COPD (chronic obstructive pulmonary disease) ([place])   . DJD (degenerative joint disease), lumbar 07/06/2011  . Esophageal reflux   . HLD (hyperlipidemia)   . HTN (hypertension)   . PAD (peripheral artery disease) ([place])    s/p L CIA stent 09/2011 ([place])  . Renal artery stenosis ([facility])    s/p bilat RA stents  . Urine incontinence  There were no vitals taken for this visit.  Opioid Risk Score:   Fall Risk Score:  `1  Depression screen PHQ 2/9  Depression screen [facility] 2/9 01/26/2016 07/27/2015 06/07/2015 01/22/2015 01/01/2015 12/04/2014 11/03/2014  Decreased Interest 0 0 0 0 0 0 0  Down, Depressed, Hopeless 0 0 0 0 0 0 0  PHQ - 2 Score 0 0 0 0 0 0 0  Altered sleeping - - - - - - -  Tired, decreased energy - - - - - - -  Change in appetite - - - - - - -  Feeling bad or failure about yourself  - - - - - - -  Trouble concentrating - - - - - - -  Moving slowly or fidgety/restless - - - - - - -  Suicidal thoughts - - - - - - -  PHQ-9 Score - - - - - - -   Review of Systems  Constitutional: Negative.   HENT: Negative.   Eyes: Negative.   Respiratory: Negative.   Cardiovascular: Negative.     Gastrointestinal: Negative.   Endocrine: Negative.   Genitourinary: Negative.   Musculoskeletal: Positive for gait problem.  Skin: Negative.   Allergic/Immunologic: Negative.   Hematological: Negative.   Psychiatric/Behavioral: Negative.   All other systems reviewed and are negative.      Objective:   Physical Exam  Constitutional: He is oriented to person, place, and time. He appears well-developed and well-nourished.  HENT:  Head: Normocephalic and atraumatic.  Neck: Normal range of motion. Neck supple.  Cardiovascular: Normal rate and regular rhythm.   Pulmonary/Chest: Effort normal and breath sounds normal.  Musculoskeletal:  Normal Muscle Bulk and Muscle Testing Reveals: Upper Extremities: Full ROM and Muscle Strength 5/5 Lower Extremities: Full ROM and Muscle Strength 5/5 Right Lower Extremity Flexion Produces Pain into Patella Arises from Table with ease Narrow Based Gait  Neurological: He is alert and oriented to person, place, and time.  Skin: Skin is warm and dry.  Psychiatric: He has a normal mood and affect.  Nursing note and vitals reviewed.         Assessment & Plan:  1. Patellofemoral arthritis. Continue Voltaren gel, and continue with exercise regimen.  2 Chronic Low Back Pain/ Lumbar Stenosis with radiculopathy:  3. Chronic Pain: Refilled: Hydrocodone 5/325 mg one tablet three times a day #90. We will continue the opioid monitoring program, this consists of regular clinic visits, examinations, urine drug screen, pill counts as well as use of [place] Controlled Substance reporting System.  20 minutes of face to face patient care time was spent during this visit. All questions were encouraged and answered.  F/U in 4 weeks

## 2016-04-27 ENCOUNTER — Telehealth: Payer: Self-pay | Admitting: Registered Nurse

## 2016-04-27 NOTE — Telephone Encounter (Signed)
On January 18,2018 NCCSR was reviewed: No conflict was seen on the [place] with Multiple Prescribers. Kristopher [name] has a signed  Narcotic Contract with our office. If there were any discrepancies this would have been  reported to his Physcian.

## 2016-05-04 NOTE — Progress Notes (Addendum)
Subjective:   Kristopher [name] is a 83 y.o. male who presents for an Initial Medicare Annual Wellness Visit.  Review of Systems  No ROS.  Medicare Wellness Visit.  Cardiac Risk Factors include: advanced age (>20men, >37 women);hypertension;dyslipidemia;male gender   Sleep patterns: no sleep issues, feels rested on waking, does not usually get up to void and 8 hours nightly.   Home Safety/Smoke Alarms: Feels safe in home. Smoke alarms in place.  Security system in place.   Living environment; residence and Firearm Safety: Lives in 3 story home w/ girlfriend and her  grandson and her  great grandson. equipment: Kristopher [name], Type: [employer], no firearms. Seat Belt Safety/Bike Helmet: Wears seat belt.   Counseling:   Eye Exam- Follows w/ Dr. Bing [name] PRN. Dental- Does not follow w/ dentist regularly. Dentures upper and lower.   Male:   CCS- Aged out PSA-  Not on file    Objective:    Today's Vitals   05/05/16 0811 05/05/16 0837  BP: (!) 160/74 (!) 150/72  Pulse: 70   Resp: 18   SpO2: 95%   Weight: 149 lb 12 oz (67.9 kg)   Height: 5\' 9"  (1.753 m)    Body mass index is 22.11 kg/m.  BP Readings from Last 3 Encounters:  05/05/16 (!) 150/72  04/18/16 (!) 157/93  03/14/16 101/62   Current Medications (verified) Outpatient Encounter Prescriptions as of 05/05/2016  Medication Sig  . Ascorbic Acid (VITAMIN C) 500 MG tablet Take 500 mg by mouth 2 (two) times daily.   Marland Kitchen aspirin 325 MG EC tablet Take 325 mg by mouth daily.  Marland Kitchen atorvastatin (LIPITOR) 80 MG tablet TAKE 1 TABLET DAILY  . BEE POLLEN PO Take by mouth.  Marland Kitchen Co-Enzyme Q-10 100 MG CAPS Take 2 capsules by mouth daily.   . diclofenac sodium (VOLTAREN) 1 % GEL Apply 2 g topically 4 (four) times daily. (Patient taking differently: Apply 2 g topically 4 (four) times daily as needed. )  . Docusate Calcium (STOOL SOFTENER PO) Take 2 capsules by mouth daily.  . fish oil-omega-3 fatty acids 1000 MG capsule Take 1 g by mouth daily.     Marland Kitchen HYDROcodone-acetaminophen (NORCO/VICODIN) 5-325 MG tablet Take 1 tablet by mouth 3 (three) times daily before meals.  . Multiple Vitamin (MULTIVITAMIN) capsule Take 1 capsule by mouth daily.   . Multiple Vitamins-Minerals (PRESERVISION AREDS PO) Take 2 tablets by mouth daily.  . nitroGLYCERIN (NITROSTAT) 0.4 MG SL tablet Place 1 tablet (0.4 mg total) under the tongue every 5 (five) minutes as needed for chest pain.  Marland Kitchen omeprazole (PRILOSEC) 20 MG capsule TAKE 2 CAPSULES DAILY  . ramipril (ALTACE) 10 MG capsule Take 1 capsule (10 mg total) by mouth daily.   No facility-administered encounter medications on file as of 05/05/2016.     Allergies (verified) Influenza vaccines and Sulfa antibiotics   History: Past Medical History:  Diagnosis Date  . AAA (abdominal aortic aneurysm) (Kristopher [name])    ultrasound 07/2011:  2.7 x 2.8 cm  . Acute myocardial infarction, unspecified site, episode of care unspecified 1998  . Aortic stenosis    echo 4/12: normal LVF, mild LAE, no significant AS  . CAD (coronary artery disease)    s/p PCI of LAD 1998;  s/p DES to RCA 05/2009;  Myoview 4/13: low risk, no ischemia, EF 56%  . COPD (chronic obstructive pulmonary disease) (Kristopher [name])   . DJD (degenerative joint disease), lumbar 07/06/2011  . Esophageal reflux   . HLD (  hyperlipidemia)   . HTN (hypertension)   . PAD (peripheral artery disease) (Kristopher [name])    s/p L CIA stent 09/2011 (Kristopher [name])  . Renal artery stenosis (Kristopher [name])    s/p bilat RA stents  . Urine incontinence    Past Surgical History:  Procedure Laterality Date  . CATARACT EXTRACTION W/ INTRAOCULAR LENS  IMPLANT, BILATERAL  YRS AGO  . CORONARY ANGIOPLASTY WITH STENT PLACEMENT    . Kristopher [name]  . LOWER EXTREMITY ANGIOGRAM Bilateral 09/20/2011   Procedure: LOWER EXTREMITY ANGIOGRAM;  Surgeon: Kristopher Hampshire, MD;  Location: [facility];  Service: Cardiovascular;  Laterality: Bilateral;  . PCI of LAD  05/1996   STENT X 4 TOTAL  . TRANSURETHRAL RESECTION OF  PROSTATE  08/11/2011   Procedure: TRANSURETHRAL RESECTION OF THE PROSTATE WITH GYRUS INSTRUMENTS;  Surgeon: Kristopher Bonine, MD;  Location: [facility];  Service: Urology;  Laterality: N/A;     . VASECTOMY  1968   Family History  Problem Relation Age of Onset  . Heart disease Mother   . Heart disease Father    Social History   Occupational History  . retired    Social History Main Topics  . Smoking status: Former Smoker    Packs/day: 3.00    Years: 38.00    Quit date: 04/10/1988  . Smokeless tobacco: Never Used  . Alcohol use No     Comment: rarely  . Drug use: No  . Sexual activity: Yes   Tobacco Counseling Counseling given: Not Answered   Activities of Daily Living In your present state of health, do you have any difficulty performing the following activities: 05/05/2016  Hearing? N  Vision? N  Difficulty concentrating or making decisions? N  Walking or climbing stairs? N  Dressing or bathing? N  Doing errands, shopping? N  Preparing Food and eating ? N  Using the Toilet? N  In the past six months, have you accidently leaked urine? N  Do you have problems with loss of bowel control? N  Managing your Medications? N  Managing your Finances? N  Housekeeping or managing your Housekeeping? N  Some recent data might be hidden    Immunizations and Health Maintenance Immunization History  Administered Date(s) Administered  . Pneumococcal Conjugate-13 01/15/2014  . Pneumococcal Polysaccharide-23 01/26/2016  . Zoster 09/16/2012   Health Maintenance Due  Topic Date Due  . Kristopher [name]  09/24/1949    Patient Care Team: Kristopher Borg, MD as PCP - General (Internal Medicine) Kristopher Perla, MD as Consulting Physician (Cardiology) Kristopher Cantor, MD as Consulting Physician (Ophthalmology) Kristopher Blake, MD as Consulting Physician (Pain Medicine)  Indicate any recent Medical Services you may have received from other than [facility] providers in the past year (date may  be approximate).    Assessment:   This is a routine wellness examination for Kristopher [name]. Physical assessment deferred to PCP.  Hearing/Vision screen Hearing Screening Comments: Able to hear conversational tones w/o difficulty. No issues reported. Passes whisper test. Vision Screening Comments: Wearing glasses. No vision issues. Follows w/ eye doctor PRN.   Dietary issues and exercise activities discussed: Current Exercise Habits: The patient does not participate in regular exercise at present, Exercise limited by: cardiac condition(s) Diet (meal preparation, eat out, water intake, caffeinated beverages, dairy products, fruits and vegetables): in general, a "healthy" diet  , well balanced, on average, 4 small meals per day. Meals vary. 3 slices of toast w/ butter in the evenings before bed. Eats some fruits  and vegetables. Drinks 2 pots of coffee w/ milk. Rarely drinks soda. Drinks 2-3 glasses of water daily. Breakfast: Cheerios  Lunch/Dinner: Varies    Goals    . Increase water intake      Depression Screen PHQ 2/9 Scores 05/05/2016 01/26/2016 07/27/2015 06/07/2015  PHQ - 2 Score 0 0 0 0  PHQ- 9 Score - - - -    Fall Risk Fall Risk  05/05/2016 03/14/2016 02/08/2016 01/26/2016 01/04/2016  Falls in the past year? Yes No No No Yes  Number falls in past yr: 1 - - - 1  Injury with Fall? No - - - No  Risk for fall due to : - - - - -  Follow up - - - - -    Cognitive Function: MMSE - Mini Mental State Exam 05/05/2016  Orientation to time 5  Orientation to Place 5  Registration 3  Attention/ Calculation 5  Recall 2  Language- name 2 objects 2  Language- repeat 1  Language- follow 3 step command 3  Language- read & follow direction 1  Write a sentence 1  Copy design 1  Total score 29        Screening Tests Health Maintenance  Topic Date Due  . TETANUS/TDAP  09/24/1949  . ZOSTAVAX  Completed  . PNA vac Low Risk Adult  Completed      Plan:  Follow-up w/ Dr. Stanford Breed.  Bring a  copy of your advance directives to your next office visit.  During the course of the visit Arthuro was educated and counseled about the following appropriate screening and preventive services:   Vaccines to include Pneumoccal, Influenza, Hepatitis B, Td, Zostavax, HCV  Cardiovascular disease screening  Diabetes screening  Glaucoma screening  Nutrition counseling  Patient Instructions (the written plan) were given to the patient.   Dorrene German, RN   05/05/2016   Medical screening examination/treatment/procedure(s) were performed by non-physician practitioner and as supervising physician I was immediately available for consultation/collaboration. I agree with above. [name], MD

## 2016-05-04 NOTE — Progress Notes (Signed)
Pre visit review using our clinic review tool, if applicable. No additional management support is needed unless otherwise documented below in the visit note. 

## 2016-05-05 ENCOUNTER — Ambulatory Visit (INDEPENDENT_AMBULATORY_CARE_PROVIDER_SITE_OTHER): Payer: Medicare Other | Admitting: *Deleted

## 2016-05-05 VITALS — BP 150/72 | HR 70 | Resp 18 | Ht 69.0 in | Wt 149.8 lb

## 2016-05-05 DIAGNOSIS — Z0000 Encounter for general adult medical examination without abnormal findings: Secondary | ICD-10-CM | POA: Diagnosis not present

## 2016-05-05 NOTE — Patient Instructions (Signed)
Schedule a follow-up appointment w/ Dr. Stanford Breed. Create your advance directives and bring a copy of your advance directives to your next office visit.  Advance Directive Advance directives are the legal documents that allow you to make choices about your health care and medical treatment if you cannot speak for yourself. Advance directives are a way for you to communicate your wishes to family, friends, and health care providers. The specified people can then convey your decisions about end-of-life care to avoid confusion if you should become unable to communicate. Ideally, the process of discussing and writing advance directives should happen over time rather than making decisions all at once. Advance directives can be modified as your situation changes, and you can change your mind at any time, even after you have signed the advance directives. Each state has its own laws regarding advance directives. You may want to check with your health care provider, attorney, or state representative about the law in your state. Below are some examples of advance directives. HEALTH CARE PROXY AND DURABLE POWER OF ATTORNEY FOR HEALTH CARE A health care proxy is a person (agent) appointed to make medical decisions for you if you cannot. Generally, people choose someone they know well and trust to represent their preferences when they can no longer do so. You should be sure to ask this person for agreement to act as your agent. An agent may have to exercise judgment in the event of a medical decision for which your wishes are not known. A durable power of attorney for health care is a legal document that names your health care proxy. Depending on the laws in your state, after the document is written, it may also need to be:  Signed.  Notarized.  Dated.  Copied.  Witnessed.  Incorporated into your medical record. You may also want to appoint someone to manage your financial affairs if you cannot. This is  called a durable power of attorney for finances. It is a separate legal document from the durable power of attorney for health care. You may choose the same person or someone different from your health care proxy to act as your agent in financial matters. LIVING WILL A living will is a set of instructions documenting your wishes about medical care when you cannot care for yourself. It is used if you become:  Terminally ill.  Incapacitated.  Unable to communicate.  Unable to make decisions. Items to consider in your living will include:  The use or non-use of life-sustaining equipment, such as dialysis machines and breathing machines (ventilators).  A do not resuscitate (DNR) order, which is the instruction not to use cardiopulmonary resuscitation (CPR) if breathing or heartbeat stops.  Tube feeding.  Withholding of food and fluids.  Comfort (palliative) care when the goal becomes comfort rather than a cure.  Organ and tissue donation. A living will does not give instructions about distribution of your money and property if you should pass away. It is advisable to seek the expert advice of a lawyer in drawing up a will regarding your possessions. Decisions about taxes, beneficiaries, and asset distribution will be legally binding. This process can relieve your family and friends of any burdens surrounding disputes or questions that may come up about the allocation of your assets. DO NOT RESUSCITATE (DNR) A do not resuscitate (DNR) order is a request to not have CPR in the event that your heart stops beating or you stop breathing. Unless given other instructions, a health care provider will  try to help any patient whose heart has stopped or who has stopped breathing.  This information is not intended to replace advice given to you by your health care provider. Make sure you discuss any questions you have with your health care provider. Document Released: 07/04/2007 Document Revised:  07/19/2015 Document Reviewed: 08/14/2012 Elsevier Interactive Patient Education  2017 [employer].

## 2016-05-19 ENCOUNTER — Encounter: Payer: Medicare Other | Attending: Registered Nurse

## 2016-05-19 ENCOUNTER — Ambulatory Visit (HOSPITAL_BASED_OUTPATIENT_CLINIC_OR_DEPARTMENT_OTHER): Payer: Medicare Other | Admitting: Physical Medicine & Rehabilitation

## 2016-05-19 ENCOUNTER — Encounter: Payer: Self-pay | Admitting: Physical Medicine & Rehabilitation

## 2016-05-19 VITALS — BP 149/77 | HR 73

## 2016-05-19 DIAGNOSIS — M25561 Pain in right knee: Secondary | ICD-10-CM

## 2016-05-19 DIAGNOSIS — G894 Chronic pain syndrome: Secondary | ICD-10-CM | POA: Diagnosis not present

## 2016-05-19 DIAGNOSIS — M1611 Unilateral primary osteoarthritis, right hip: Secondary | ICD-10-CM

## 2016-05-19 DIAGNOSIS — Z79899 Other long term (current) drug therapy: Secondary | ICD-10-CM | POA: Insufficient documentation

## 2016-05-19 DIAGNOSIS — M1711 Unilateral primary osteoarthritis, right knee: Secondary | ICD-10-CM | POA: Insufficient documentation

## 2016-05-19 DIAGNOSIS — Z5181 Encounter for therapeutic drug level monitoring: Secondary | ICD-10-CM | POA: Diagnosis not present

## 2016-05-19 DIAGNOSIS — M48062 Spinal stenosis, lumbar region with neurogenic claudication: Secondary | ICD-10-CM | POA: Diagnosis not present

## 2016-05-19 MED ORDER — HYDROCODONE-ACETAMINOPHEN 5-325 MG PO TABS: 1.0000 | ORAL_TABLET | Freq: Three times a day (TID) | ORAL | 0 refills | Status: DC

## 2016-05-19 NOTE — Progress Notes (Signed)
Subjective:    Patient ID: Kristopher [name], male    DOB: Apr 06, 1931, 83 y.o.   MRN: [id number]  HPI Hip doing ok Right knee pain with ambulation to mailbox No back pain  Pain Inventory Average Pain 3 Pain Right Now 2 My pain is dull and aching  In the last 24 hours, has pain interfered with the following? General activity 3 Relation with others 3 Enjoyment of life 3 What TIME of day is your pain at its worst? . Sleep (in general) .  Pain is worse with: . Pain improves with: . Relief from Meds: .  Mobility use a cane ability to climb steps?  yes  Function retired  Neuro/Psych No problems in this area  Prior Studies Any changes since last visit?  no  Physicians involved in your care Any changes since last visit?  no   Family History  Problem Relation Age of Onset  . Heart disease Mother   . Heart disease Father    Social History   Social History  . Marital status: Widowed    Spouse name: N/A  . Number of children: N/A  . Years of education: GED   Occupational History  . retired    Social History Main Topics  . Smoking status: Former Smoker    Packs/day: 3.00    Years: 38.00    Quit date: 04/10/1988  . Smokeless tobacco: Never Used  . Alcohol use No     Comment: rarely  . Drug use: No  . Sexual activity: Yes   Other Topics Concern  . None   Social History Narrative  . None   Past Surgical History:  Procedure Laterality Date  . CATARACT EXTRACTION W/ INTRAOCULAR LENS  IMPLANT, BILATERAL  YRS AGO  . CORONARY ANGIOPLASTY WITH STENT PLACEMENT    . [place]  . LOWER EXTREMITY ANGIOGRAM Bilateral 09/20/2011   Procedure: LOWER EXTREMITY ANGIOGRAM;  Surgeon: Wellington Hampshire, MD;  Location: [facility];  Service: Cardiovascular;  Laterality: Bilateral;  . PCI of LAD  05/1996   STENT X 4 TOTAL  . TRANSURETHRAL RESECTION OF PROSTATE  08/11/2011   Procedure: TRANSURETHRAL RESECTION OF THE PROSTATE WITH GYRUS INSTRUMENTS;  Surgeon:  Fredricka Bonine, MD;  Location: [facility];  Service: Urology;  Laterality: N/A;     . VASECTOMY  1968   Past Medical History:  Diagnosis Date  . AAA (abdominal aortic aneurysm) ([place])    ultrasound 07/2011:  2.7 x 2.8 cm  . Acute myocardial infarction, unspecified site, episode of care unspecified 1998  . Aortic stenosis    echo 4/12: normal LVF, mild LAE, no significant AS  . CAD (coronary artery disease)    s/p PCI of LAD 1998;  s/p DES to RCA 05/2009;  Myoview 4/13: low risk, no ischemia, EF 56%  . COPD (chronic obstructive pulmonary disease) ([place])   . DJD (degenerative joint disease), lumbar 07/06/2011  . Esophageal reflux   . HLD (hyperlipidemia)   . HTN (hypertension)   . PAD (peripheral artery disease) ([place])    s/p L CIA stent 09/2011 ([place])  . Renal artery stenosis ([facility])    s/p bilat RA stents  . Urine incontinence    BP (!) 149/77   Pulse 73   SpO2 96%   Opioid Risk Score:   Fall Risk Score:  `1  Depression screen PHQ 2/9  Depression screen [facility] 2/9 05/05/2016 01/26/2016 07/27/2015 06/07/2015 01/22/2015 01/01/2015 12/04/2014  Decreased Interest 0 0  0 0 0 0 0  Down, Depressed, Hopeless 0 0 0 0 0 0 0  PHQ - 2 Score 0 0 0 0 0 0 0  Altered sleeping - - - - - - -  Tired, decreased energy - - - - - - -  Change in appetite - - - - - - -  Feeling bad or failure about yourself  - - - - - - -  Trouble concentrating - - - - - - -  Moving slowly or fidgety/restless - - - - - - -  Suicidal thoughts - - - - - - -  PHQ-9 Score - - - - - - -   Review of Systems  Constitutional: Negative.   HENT: Negative.   Eyes: Negative.   Respiratory: Negative.   Cardiovascular: Negative.   Gastrointestinal: Negative.   Endocrine: Negative.   Genitourinary: Negative.   Musculoskeletal: Positive for gait problem and joint swelling.  Skin: Negative.   Allergic/Immunologic: Negative.   Hematological: Negative.   Psychiatric/Behavioral: Negative.   All other systems reviewed and are  negative.      Objective:   Physical Exam  Constitutional: He is oriented to person, place, and time. He appears well-developed and well-nourished.  HENT:  Head: Normocephalic and atraumatic.  Eyes: Conjunctivae and EOM are normal. Pupils are equal, round, and reactive to light.  Neck: Normal range of motion.  Neurological: He is alert and oriented to person, place, and time. Gait abnormal.  Antalgic gait RIght side  Psychiatric: He has a normal mood and affect. His behavior is normal. Judgment and thought content normal.  Nursing note and vitals reviewed. No numbness or tingling in the right thigh. Deep tendon reflexes are absent bilateral patella bilateral Achilles. The patient has mild pain with internal/external rotation of the right hip. No pain with right knee range of motion. There is tenderness over the patellar tendon. Also, right medial joint line, tenderness        Assessment & Plan:  1. Right knee osteoarthritis, right hip osteoarthritis as well as lumbar spinal stenosis. Continue hydrocodone.Current dose 5 mg 3 times a day Last urine drug screen February 2017 was appropriate Continue opioid monitoring program. This consists of regular clinic visits, examinations, urine drug screen, pill counts as well as use of [place] controlled substance reporting System.  Nurse practitioner. Visit 1 months M.D. visit 6 months  The [place] control substances reporting system was reviewed. There currently is no evidence of multiple prescribers for opiates and other controlled substances. Patients on chronic opioids will have this reviewed at minimum every 3 months.

## 2016-05-19 NOTE — Progress Notes (Signed)
HPI: FU CAD, s/p PCI of his LAD performed in February 1998. He also has PVD and has had prior bilateral renal artery stenting. Cardiac catheterization in February 2011: EF 60% and no renal artery stenosis, RCA 90% but no obstructive disease in the left system. He had PCI of his right coronary artery with a drug-eluting stent at that time. Echocardiogram in April of 2012 revealed normal LV function, mild LAE, no significant AS. LE angiogram 09/20/11: Small infrarenal aortic aneurysm, no renal artery stenosis, ostial right CIA 60%, proximal left CIA severely calcified with 80-90%, left EIA occluded and filled by collats. He underwent intervention with angioplasty and stenting to the left CIA. H/O asymptomatic Mobitz 1. Nuclear study March 2016 with ejection fraction 51%, diaphragmatic attenuation, no ischemia. Abdominal ultrasound June 2016 showed a 2.8 x 2.7 cm abdominal aortic aneurysm and 50-69% right common iliac artery. Since last seen, he does have dyspnea on exertion. No orthopnea, PND, pedal edema or syncope. Rare chest pain for which he takes nitroglycerin.  Current Outpatient Prescriptions  Medication Sig Dispense Refill  . Ascorbic Acid (VITAMIN C) 500 MG tablet Take 500 mg by mouth 2 (two) times daily.     Marland Kitchen aspirin 325 MG EC tablet Take 325 mg by mouth daily.    Marland Kitchen atorvastatin (LIPITOR) 80 MG tablet TAKE 1 TABLET DAILY 90 tablet 1  . BEE POLLEN PO Take by mouth.    Marland Kitchen Co-Enzyme Q-10 100 MG CAPS Take 2 capsules by mouth daily.     . diclofenac sodium (VOLTAREN) 1 % GEL Apply 2 g topically 4 (four) times daily. (Patient taking differently: Apply 2 g topically 4 (four) times daily as needed. ) 9 Tube 1  . Docusate Calcium (STOOL SOFTENER PO) Take 2 capsules by mouth daily.    . fish oil-omega-3 fatty acids 1000 MG capsule Take 1 g by mouth daily.     Marland Kitchen HYDROcodone-acetaminophen (NORCO/VICODIN) 5-325 MG tablet Take 1 tablet by mouth 3 (three) times daily before meals. 90 tablet 0  .  Multiple Vitamin (MULTIVITAMIN) capsule Take 1 capsule by mouth daily.     . Multiple Vitamins-Minerals (PRESERVISION AREDS PO) Take 2 tablets by mouth daily.    . nitroGLYCERIN (NITROSTAT) 0.4 MG SL tablet Place 1 tablet (0.4 mg total) under the tongue every 5 (five) minutes as needed for chest pain. 90 tablet 3  . omeprazole (PRILOSEC) 20 MG capsule TAKE 2 CAPSULES DAILY 180 capsule 0  . ramipril (ALTACE) 10 MG capsule Take 1 capsule (10 mg total) by mouth daily. 90 capsule 0   No current facility-administered medications for this visit.      Past Medical History:  Diagnosis Date  . AAA (abdominal aortic aneurysm) ([place])    ultrasound 07/2011:  2.7 x 2.8 cm  . Acute myocardial infarction, unspecified site, episode of care unspecified 1998  . Aortic stenosis    echo 4/12: normal LVF, mild LAE, no significant AS  . CAD (coronary artery disease)    s/p PCI of LAD 1998;  s/p DES to RCA 05/2009;  Myoview 4/13: low risk, no ischemia, EF 56%  . COPD (chronic obstructive pulmonary disease) ([place])   . DJD (degenerative joint disease), lumbar 07/06/2011  . Esophageal reflux   . HLD (hyperlipidemia)   . HTN (hypertension)   . PAD (peripheral artery disease) ([place])    s/p L CIA stent 09/2011 ([place])  . Renal artery stenosis ([facility])    s/p bilat RA stents  .  Urine incontinence     Past Surgical History:  Procedure Laterality Date  . CATARACT EXTRACTION W/ INTRAOCULAR LENS  IMPLANT, BILATERAL  YRS AGO  . CORONARY ANGIOPLASTY WITH STENT PLACEMENT    . [place]  . LOWER EXTREMITY ANGIOGRAM Bilateral 09/20/2011   Procedure: LOWER EXTREMITY ANGIOGRAM;  Surgeon: Wellington Hampshire, MD;  Location: [facility];  Service: Cardiovascular;  Laterality: Bilateral;  . PCI of LAD  05/1996   STENT X 4 TOTAL  . TRANSURETHRAL RESECTION OF PROSTATE  08/11/2011   Procedure: TRANSURETHRAL RESECTION OF THE PROSTATE WITH GYRUS INSTRUMENTS;  Surgeon: Fredricka Bonine, MD;  Location: [facility];  Service:  Urology;  Laterality: N/A;     . VASECTOMY  1968    Social History   Social History  . Marital status: Widowed    Spouse name: N/A  . Number of children: N/A  . Years of education: GED   Occupational History  . retired    Social History Main Topics  . Smoking status: Former Smoker    Packs/day: 3.00    Years: 38.00    Quit date: 04/10/1988  . Smokeless tobacco: Never Used  . Alcohol use No     Comment: rarely  . Drug use: No  . Sexual activity: Yes   Other Topics Concern  . Not on file   Social History Narrative  . No narrative on file    Family History  Problem Relation Age of Onset  . Heart disease Mother   . Heart disease Father     ROS: no fevers or chills, productive cough, hemoptysis, dysphasia, odynophagia, melena, hematochezia, dysuria, hematuria, rash, seizure activity, orthopnea, PND, pedal edema, claudication. Remaining systems are negative.  Physical Exam: Well-developed well-nourished in no acute distress.  Skin is warm and dry.  HEENT is normal.  Neck is supple.  Chest diminished BS Cardiovascular exam is regular rate and rhythm.  Abdominal exam nontender or distended. No masses palpated. Extremities show no edema. neuro grossly intact  ECG-Sinus rhythm at a rate of 59. Right bundle branch block.  A/P  1 Coronary artery disease-continue aspirin and statin. He notes some dyspnea on exertion. I will arrange a [facility] nuclear study for risk stratification.  2 hypertension-blood pressure controlled. Continue present medications.   3 hyperlipidemia-continue statin.  4 abdominal aortic aneurysm-schedule follow-up abdominal ultrasound.   5 renal artery stenosis-schedule follow-up [place].  6 peripheral vascular disease-continue aspirin and statin.   7 history of Mobitz 1 second-degree AV block-we are avoiding beta-blockade.  8 dyspnea-etiology unclear. Nuclear study to screen for ischemia and reassess LV function. Not volume overloaded on  examination. Possible contribution from COPD.  [name], MD

## 2016-05-19 NOTE — Patient Instructions (Signed)
Recommend stationary bicycling

## 2016-05-31 ENCOUNTER — Ambulatory Visit (INDEPENDENT_AMBULATORY_CARE_PROVIDER_SITE_OTHER): Payer: Medicare Other | Admitting: Cardiology

## 2016-05-31 ENCOUNTER — Encounter: Payer: Self-pay | Admitting: Cardiology

## 2016-05-31 VITALS — BP 102/58 | HR 58 | Ht 68.5 in | Wt 150.0 lb

## 2016-05-31 DIAGNOSIS — R072 Precordial pain: Secondary | ICD-10-CM | POA: Diagnosis not present

## 2016-05-31 DIAGNOSIS — I2510 Atherosclerotic heart disease of native coronary artery without angina pectoris: Secondary | ICD-10-CM | POA: Diagnosis not present

## 2016-05-31 DIAGNOSIS — I714 Abdominal aortic aneurysm, without rupture, unspecified: Secondary | ICD-10-CM

## 2016-05-31 DIAGNOSIS — I701 Atherosclerosis of renal artery: Secondary | ICD-10-CM

## 2016-05-31 NOTE — Patient Instructions (Signed)
Medication Instructions:   NO CHANGE  Testing/Procedures:  Your physician has requested that you have a lexiscan myoview. For further information please visit [email or web address]. Please follow instruction sheet, as given.   Your physician has requested that you have an abdominal aorta duplex. During this test, an ultrasound is used to evaluate the aorta. Allow 30 minutes for this exam. Do not eat after midnight the day before and avoid carbonated beverages   Follow-Up:  Your physician wants you to follow-up in: [place] will receive a reminder letter in the mail two months in advance. If you don't receive a letter, please call our office to schedule the follow-up appointment.   If you need a refill on your cardiac medications before your next appointment, please call your pharmacy.

## 2016-06-16 ENCOUNTER — Encounter: Payer: Medicare Other | Attending: Registered Nurse | Admitting: Registered Nurse

## 2016-06-16 ENCOUNTER — Encounter: Payer: Self-pay | Admitting: Registered Nurse

## 2016-06-16 VITALS — BP 125/81 | HR 72 | Resp 14

## 2016-06-16 DIAGNOSIS — Z79899 Other long term (current) drug therapy: Secondary | ICD-10-CM | POA: Insufficient documentation

## 2016-06-16 DIAGNOSIS — M48062 Spinal stenosis, lumbar region with neurogenic claudication: Secondary | ICD-10-CM

## 2016-06-16 DIAGNOSIS — I701 Atherosclerosis of renal artery: Secondary | ICD-10-CM | POA: Diagnosis not present

## 2016-06-16 DIAGNOSIS — M25561 Pain in right knee: Secondary | ICD-10-CM

## 2016-06-16 DIAGNOSIS — M1711 Unilateral primary osteoarthritis, right knee: Secondary | ICD-10-CM | POA: Diagnosis not present

## 2016-06-16 DIAGNOSIS — M1611 Unilateral primary osteoarthritis, right hip: Secondary | ICD-10-CM | POA: Insufficient documentation

## 2016-06-16 DIAGNOSIS — G894 Chronic pain syndrome: Secondary | ICD-10-CM | POA: Diagnosis not present

## 2016-06-16 DIAGNOSIS — Z5181 Encounter for therapeutic drug level monitoring: Secondary | ICD-10-CM | POA: Diagnosis not present

## 2016-06-16 MED ORDER — HYDROCODONE-ACETAMINOPHEN 5-325 MG PO TABS: 1.0000 | ORAL_TABLET | Freq: Three times a day (TID) | ORAL | 0 refills | Status: DC

## 2016-06-16 NOTE — Progress Notes (Signed)
Subjective:    Patient ID: Kristopher [name], male    DOB: 03-15-1931, 83 y.o.   MRN: [id number]  HPI: [name] is a 83year old male who returns for follow up appointment for chronic pain and medication refill. He states his pain is located in his lower back ( occasionally) with activityand right knee. Heratehis pain 2. His current exercise regime is walking and performing stretching exercises  Pain Inventory Average Pain 3 Pain Right Now 2 My pain is constant and dull  In the last 24 hours, has pain interfered with the following? General activity 0 Relation with others 0 Enjoyment of life 0 What TIME of day is your pain at its worst? evening Sleep (in general) Good  Pain is worse with: walking, bending and standing Pain improves with: medication Relief from Meds: 9  Mobility walk with assistance use a cane ability to climb steps?  yes do you drive?  yes  Function retired  Neuro/Psych trouble walking  Prior Studies Any changes since last visit?  no  Physicians involved in your care Any changes since last visit?  no   Family History  Problem Relation Age of Onset  . Heart disease Mother   . Heart disease Father    Social History   Social History  . Marital status: Widowed    Spouse name: N/A  . Number of children: N/A  . Years of education: GED   Occupational History  . retired    Social History Main Topics  . Smoking status: Former Smoker    Packs/day: 3.00    Years: 38.00    Quit date: 04/10/1988  . Smokeless tobacco: Never Used  . Alcohol use No     Comment: rarely  . Drug use: No  . Sexual activity: Yes   Other Topics Concern  . None   Social History Narrative  . None   Past Surgical History:  Procedure Laterality Date  . CATARACT EXTRACTION W/ INTRAOCULAR LENS  IMPLANT, BILATERAL  YRS AGO  . CORONARY ANGIOPLASTY WITH STENT PLACEMENT    . [place]  . LOWER EXTREMITY ANGIOGRAM Bilateral 09/20/2011   Procedure: LOWER EXTREMITY ANGIOGRAM;  Surgeon: Wellington Hampshire, MD;  Location: [facility];  Service: Cardiovascular;  Laterality: Bilateral;  . PCI of LAD  05/1996   STENT X 4 TOTAL  . TRANSURETHRAL RESECTION OF PROSTATE  08/11/2011   Procedure: TRANSURETHRAL RESECTION OF THE PROSTATE WITH GYRUS INSTRUMENTS;  Surgeon: Fredricka Bonine, MD;  Location: [facility];  Service: Urology;  Laterality: N/A;     . VASECTOMY  1968   Past Medical History:  Diagnosis Date  . AAA (abdominal aortic aneurysm) ([place])    ultrasound 07/2011:  2.7 x 2.8 cm  . Acute myocardial infarction, unspecified site, episode of care unspecified 1998  . Aortic stenosis    echo 4/12: normal LVF, mild LAE, no significant AS  . CAD (coronary artery disease)    s/p PCI of LAD 1998;  s/p DES to RCA 05/2009;  Myoview 4/13: low risk, no ischemia, EF 56%  . COPD (chronic obstructive pulmonary disease) ([place])   . DJD (degenerative joint disease), lumbar 07/06/2011  . Esophageal reflux   . HLD (hyperlipidemia)   . HTN (hypertension)   . PAD (peripheral artery disease) ([place])    s/p L CIA stent 09/2011 ([place])  . Renal artery stenosis ([facility])    s/p bilat RA stents  . Urine incontinence    BP  125/81 (BP Location: Left Arm, Patient Position: Sitting, Cuff Size: Normal)   Pulse 72   Resp 14   SpO2 91%   Opioid Risk Score:   Fall Risk Score:  `1  Depression screen PHQ 2/9  Depression screen [facility] 2/9 05/05/2016 01/26/2016 07/27/2015 06/07/2015 01/22/2015 01/01/2015 12/04/2014  Decreased Interest 0 0 0 0 0 0 0  Down, Depressed, Hopeless 0 0 0 0 0 0 0  PHQ - 2 Score 0 0 0 0 0 0 0  Altered sleeping - - - - - - -  Tired, decreased energy - - - - - - -  Change in appetite - - - - - - -  Feeling bad or failure about yourself  - - - - - - -  Trouble concentrating - - - - - - -  Moving slowly or fidgety/restless - - - - - - -  Suicidal thoughts - - - - - - -  PHQ-9 Score - - - - - - -    Review of Systems  Constitutional:  Negative.   HENT: Negative.   Eyes: Negative.   Respiratory: Negative.   Cardiovascular: Negative.   Gastrointestinal: Negative.   Endocrine: Negative.   Genitourinary: Negative.   Musculoskeletal: Positive for gait problem.  Skin: Negative.   Allergic/Immunologic: Negative.   Hematological: Negative.   Psychiatric/Behavioral: Negative.   All other systems reviewed and are negative.      Objective:   Physical Exam  Constitutional: He is oriented to person, place, and time. He appears well-developed and well-nourished.  HENT:  Head: Normocephalic and atraumatic.  Neck: Normal range of motion.  Cardiovascular: Normal rate and regular rhythm.   Pulmonary/Chest: Effort normal and breath sounds normal.  Musculoskeletal:  Normal Muscle Bulk and Muscle Testing Reveals: Upper Extremities: Full ROM and Muscle Strength 5/5 Lower Extremities: Full ROM and Muscle Strength 5/5 Right Lower Extremity Flexion Produces Pain into Patella Arises from chair slowly  Antalgic Gait   Neurological: He is alert and oriented to person, place, and time.  Skin: Skin is warm and dry.  Psychiatric: He has a normal mood and affect.  Nursing note and vitals reviewed.         Assessment & Plan:  1. Patellofemoral arthritis. Continue Voltaren gel, and continue with exercise regimen. 06/16/2016 2 Chronic Low Back Pain/ Lumbar Stenosis with radiculopathy: 06/16/2016 3. Chronic Pain: Refilled: Hydrocodone 5/325 mg one tablet three times a day #90. We will continue the opioid monitoring program, this consists of regular clinic visits, examinations, urine drug screen, pill counts as well as use of [place] Controlled Substance reporting System.  20 minutes of face to face patient care time was spent during this visit. All questions were encouraged and answered.  F/U in 4 weeks

## 2016-06-20 ENCOUNTER — Encounter (HOSPITAL_COMMUNITY): Payer: Medicare Other

## 2016-06-22 ENCOUNTER — Telehealth (HOSPITAL_COMMUNITY): Payer: Self-pay

## 2016-06-22 LAB — TOXASSURE SELECT,+ANTIDEPR,UR

## 2016-06-22 NOTE — Telephone Encounter (Signed)
Encounter complete. 

## 2016-06-27 ENCOUNTER — Ambulatory Visit (HOSPITAL_COMMUNITY)
Admission: RE | Admit: 2016-06-27 | Discharge: 2016-06-27 | Disposition: A | Discharge status: Home / Self Care | Payer: Medicare Other | Source: Ambulatory Visit | Attending: Cardiovascular Disease | Admitting: Cardiovascular Disease

## 2016-06-27 DIAGNOSIS — Z8249 Family history of ischemic heart disease and other diseases of the circulatory system: Secondary | ICD-10-CM | POA: Diagnosis not present

## 2016-06-27 DIAGNOSIS — I714 Abdominal aortic aneurysm, without rupture, unspecified: Secondary | ICD-10-CM

## 2016-06-27 DIAGNOSIS — J449 Chronic obstructive pulmonary disease, unspecified: Secondary | ICD-10-CM | POA: Insufficient documentation

## 2016-06-27 DIAGNOSIS — R072 Precordial pain: Secondary | ICD-10-CM

## 2016-06-27 DIAGNOSIS — I252 Old myocardial infarction: Secondary | ICD-10-CM | POA: Diagnosis not present

## 2016-06-27 DIAGNOSIS — I2510 Atherosclerotic heart disease of native coronary artery without angina pectoris: Secondary | ICD-10-CM | POA: Insufficient documentation

## 2016-06-27 DIAGNOSIS — I739 Peripheral vascular disease, unspecified: Secondary | ICD-10-CM | POA: Diagnosis not present

## 2016-06-27 DIAGNOSIS — I708 Atherosclerosis of other arteries: Secondary | ICD-10-CM | POA: Insufficient documentation

## 2016-06-27 DIAGNOSIS — I10 Essential (primary) hypertension: Secondary | ICD-10-CM | POA: Insufficient documentation

## 2016-06-27 DIAGNOSIS — E785 Hyperlipidemia, unspecified: Secondary | ICD-10-CM | POA: Diagnosis not present

## 2016-06-27 DIAGNOSIS — I350 Nonrheumatic aortic (valve) stenosis: Secondary | ICD-10-CM | POA: Diagnosis not present

## 2016-06-27 DIAGNOSIS — I701 Atherosclerosis of renal artery: Secondary | ICD-10-CM | POA: Diagnosis not present

## 2016-06-27 LAB — MYOCARDIAL PERFUSION IMAGING
LV dias vol: 96 mL (ref 62–150)
LV sys vol: 43 mL
Peak HR: 82 {beats}/min
Rest HR: 62 {beats}/min
SDS: 1
SRS: 1
SSS: 2
TID: 0.99

## 2016-06-27 MED ORDER — TECHNETIUM TC 99M TETROFOSMIN IV KIT
10.6000 | PACK | Freq: Once | INTRAVENOUS | Status: AC | PRN
  Administered 2016-06-27: 10.6 via INTRAVENOUS
  Filled 2016-06-27: qty 11

## 2016-06-27 MED ORDER — REGADENOSON 0.4 MG/5ML IV SOLN
0.4000 mg | Freq: Once | INTRAVENOUS | Status: AC
  Administered 2016-06-27: 0.4 mg via INTRAVENOUS

## 2016-06-27 MED ORDER — TECHNETIUM TC 99M TETROFOSMIN IV KIT
30.0000 | PACK | Freq: Once | INTRAVENOUS | Status: AC | PRN
Start: 2016-06-27 — End: 2016-06-27
  Administered 2016-06-27: 30 via INTRAVENOUS
  Filled 2016-06-27: qty 30

## 2016-06-28 ENCOUNTER — Telehealth: Payer: Self-pay | Admitting: Cardiology

## 2016-06-28 NOTE — Telephone Encounter (Signed)
New Message     Pt is returning Altha Harm about test results

## 2016-06-29 NOTE — Telephone Encounter (Signed)
Patient made aware of results verbalized understanding

## 2016-06-29 NOTE — Telephone Encounter (Signed)
Follow up ° ° ° ° ° °Returning a call to the nurse to get lab results °

## 2016-07-01 ENCOUNTER — Other Ambulatory Visit: Payer: Self-pay | Admitting: Internal Medicine

## 2016-07-04 ENCOUNTER — Other Ambulatory Visit: Payer: Self-pay | Admitting: Cardiology

## 2016-07-21 ENCOUNTER — Encounter: Payer: Self-pay | Admitting: Registered Nurse

## 2016-07-21 ENCOUNTER — Encounter: Payer: Medicare Other | Attending: Registered Nurse | Admitting: Registered Nurse

## 2016-07-21 ENCOUNTER — Telehealth: Payer: Self-pay | Admitting: Registered Nurse

## 2016-07-21 VITALS — BP 144/79 | HR 62

## 2016-07-21 DIAGNOSIS — Z5181 Encounter for therapeutic drug level monitoring: Secondary | ICD-10-CM | POA: Diagnosis not present

## 2016-07-21 DIAGNOSIS — M48062 Spinal stenosis, lumbar region with neurogenic claudication: Secondary | ICD-10-CM | POA: Diagnosis not present

## 2016-07-21 DIAGNOSIS — M1711 Unilateral primary osteoarthritis, right knee: Secondary | ICD-10-CM | POA: Diagnosis not present

## 2016-07-21 DIAGNOSIS — M25561 Pain in right knee: Secondary | ICD-10-CM

## 2016-07-21 DIAGNOSIS — G894 Chronic pain syndrome: Secondary | ICD-10-CM

## 2016-07-21 DIAGNOSIS — Z79899 Other long term (current) drug therapy: Secondary | ICD-10-CM

## 2016-07-21 DIAGNOSIS — I701 Atherosclerosis of renal artery: Secondary | ICD-10-CM | POA: Diagnosis not present

## 2016-07-21 DIAGNOSIS — M1611 Unilateral primary osteoarthritis, right hip: Secondary | ICD-10-CM | POA: Insufficient documentation

## 2016-07-21 MED ORDER — HYDROCODONE-ACETAMINOPHEN 5-325 MG PO TABS: 1.0000 | ORAL_TABLET | Freq: Three times a day (TID) | ORAL | 0 refills | Status: DC

## 2016-07-21 NOTE — Progress Notes (Signed)
Subjective:    Patient ID: Kristopher [name], male    DOB: 05/14/1930, 83 y.o.   MRN: [id number]  HPI:Mr. AERION BAGDASARIAN is a 83year old male who returns for follow up appointmentfor chronic pain and medication refill. He states his pain is located in his lower back ( occasionally) with activityand right knee. Heratehis pain 4. His current exercise regime is walking, performing stretching exercises and mowing with push mower.   Mr. Wainer last UDS was on 06/16/2016, it was consistent.  Pain Inventory Average Pain 3 Pain Right Now 4 My pain is constant, sharp and dull  In the last 24 hours, has pain interfered with the following? General activity 0 Relation with others 0 Enjoyment of life 1 What TIME of day is your pain at its worst? varies Sleep (in general) Good  Pain is worse with: some activites Pain improves with: medication Relief from Meds: 9  Mobility walk with assistance use a cane how many minutes can you walk? 15-20 ability to climb steps?  yes do you drive?  yes  Function retired  Neuro/Psych numbness trouble walking  Prior Studies Any changes since last visit?  no  Physicians involved in your care Any changes since last visit?  no   Family History  Problem Relation Age of Onset  . Heart disease Mother   . Heart disease Father    Social History   Social History  . Marital status: Widowed    Spouse name: N/A  . Number of children: N/A  . Years of education: GED   Occupational History  . retired    Social History Main Topics  . Smoking status: Former Smoker    Packs/day: 3.00    Years: 38.00    Quit date: 04/10/1988  . Smokeless tobacco: Never Used  . Alcohol use No     Comment: rarely  . Drug use: No  . Sexual activity: Yes   Other Topics Concern  . None   Social History Narrative  . None   Past Surgical History:  Procedure Laterality Date  . CATARACT EXTRACTION W/ INTRAOCULAR LENS  IMPLANT, BILATERAL  YRS AGO  . CORONARY  ANGIOPLASTY WITH STENT PLACEMENT    . [place]  . LOWER EXTREMITY ANGIOGRAM Bilateral 09/20/2011   Procedure: LOWER EXTREMITY ANGIOGRAM;  Surgeon: Wellington Hampshire, MD;  Location: [facility];  Service: Cardiovascular;  Laterality: Bilateral;  . PCI of LAD  05/1996   STENT X 4 TOTAL  . TRANSURETHRAL RESECTION OF PROSTATE  08/11/2011   Procedure: TRANSURETHRAL RESECTION OF THE PROSTATE WITH GYRUS INSTRUMENTS;  Surgeon: Fredricka Bonine, MD;  Location: [facility];  Service: Urology;  Laterality: N/A;     . VASECTOMY  1968   Past Medical History:  Diagnosis Date  . AAA (abdominal aortic aneurysm) ([place])    ultrasound 07/2011:  2.7 x 2.8 cm  . Acute myocardial infarction, unspecified site, episode of care unspecified 1998  . Aortic stenosis    echo 4/12: normal LVF, mild LAE, no significant AS  . CAD (coronary artery disease)    s/p PCI of LAD 1998;  s/p DES to RCA 05/2009;  Myoview 4/13: low risk, no ischemia, EF 56%  . COPD (chronic obstructive pulmonary disease) ([place])   . DJD (degenerative joint disease), lumbar 07/06/2011  . Esophageal reflux   . HLD (hyperlipidemia)   . HTN (hypertension)   . PAD (peripheral artery disease) ([facility])    s/p L CIA stent 09/2011 (  [place])  . Renal artery stenosis ([facility])    s/p bilat RA stents  . Urine incontinence    There were no vitals taken for this visit.  Opioid Risk Score:   Fall Risk Score:  `1  Depression screen PHQ 2/9  Depression screen [facility] 2/9 05/05/2016 01/26/2016 07/27/2015 06/07/2015 01/22/2015 01/01/2015 12/04/2014  Decreased Interest 0 0 0 0 0 0 0  Down, Depressed, Hopeless 0 0 0 0 0 0 0  PHQ - 2 Score 0 0 0 0 0 0 0  Altered sleeping - - - - - - -  Tired, decreased energy - - - - - - -  Change in appetite - - - - - - -  Feeling bad or failure about yourself  - - - - - - -  Trouble concentrating - - - - - - -  Moving slowly or fidgety/restless - - - - - - -  Suicidal thoughts - - - - - - -  PHQ-9 Score - - - - - - -     Review of Systems  Constitutional: Negative.   HENT: Negative.   Eyes: Negative.   Respiratory: Negative.   Cardiovascular: Negative.   Gastrointestinal: Negative.   Endocrine: Negative.   Genitourinary: Negative.   Musculoskeletal: Positive for arthralgias, gait problem and myalgias.  Skin: Negative.   Allergic/Immunologic: Negative.   Hematological: Negative.   Psychiatric/Behavioral: Negative.   All other systems reviewed and are negative.      Objective:   Physical Exam  Constitutional: He is oriented to person, place, and time. He appears well-developed and well-nourished.  HENT:  Head: Normocephalic and atraumatic.  Neck: Normal range of motion. Neck supple.  Cardiovascular: Normal rate and regular rhythm.   Pulmonary/Chest: Effort normal and breath sounds normal.  Musculoskeletal:  Normal Muscle Bulk and Muscle Testing Reveals: Upper Extremities: Full ROM and Muscle Strength 5/5 Back without spinal tenderness noted Lower Extremities: Full ROM and Muscle Strength 5/5 Right Lower Extremity Flexion Produces Pain into Patella Arises from Table slowly using 3 prong cane for support Narrow Based Gait  Neurological: He is alert and oriented to person, place, and time.  Skin: Skin is warm and dry.  Psychiatric: He has a normal mood and affect.  Nursing note and vitals reviewed.         Assessment & Plan:  1. Patellofemoral arthritis. Continue Voltaren gel, and continue with exercise regimen. 07/21/2016 2 Chronic Low Back Pain/ Lumbar Stenosis with radiculopathy: 07/21/2016 3. Chronic Pain: Refilled: Hydrocodone 5/325 mg one tablet three times a day #90. We will continue the opioid monitoring program, this consists of regular clinic visits, examinations, urine drug screen, pill counts as well as use of [place] Controlled Substance reporting System.  20 minutes of face to face patient care time was spent during this visit. All questions were encouraged  and answered.  F/U in 5weeks

## 2016-07-21 NOTE — Telephone Encounter (Signed)
On 07/21/2016 the [place] was reviewed no conflict was seen on the [place] with multiple prescribers. Kristopher [name] has a signed narcotic contract with our office. If there were any discrepancies this would have been reported to his physician.

## 2016-07-22 ENCOUNTER — Other Ambulatory Visit: Payer: Self-pay | Admitting: Cardiology

## 2016-07-24 ENCOUNTER — Telehealth: Payer: Self-pay | Admitting: Registered Nurse

## 2016-07-24 NOTE — Telephone Encounter (Signed)
Kristopher [name] had a UDS performed on 06/16/2016, it was consistent.

## 2016-07-24 NOTE — Telephone Encounter (Signed)
Rx(s) sent to pharmacy electronically.  

## 2016-08-22 ENCOUNTER — Encounter: Payer: Medicare Other | Attending: Registered Nurse | Admitting: Registered Nurse

## 2016-08-22 ENCOUNTER — Encounter: Payer: Self-pay | Admitting: Registered Nurse

## 2016-08-22 VITALS — BP 122/73 | HR 61

## 2016-08-22 DIAGNOSIS — Z79899 Other long term (current) drug therapy: Secondary | ICD-10-CM | POA: Diagnosis not present

## 2016-08-22 DIAGNOSIS — I701 Atherosclerosis of renal artery: Secondary | ICD-10-CM

## 2016-08-22 DIAGNOSIS — M48062 Spinal stenosis, lumbar region with neurogenic claudication: Secondary | ICD-10-CM

## 2016-08-22 DIAGNOSIS — M25561 Pain in right knee: Secondary | ICD-10-CM | POA: Diagnosis not present

## 2016-08-22 DIAGNOSIS — G894 Chronic pain syndrome: Secondary | ICD-10-CM

## 2016-08-22 DIAGNOSIS — M1611 Unilateral primary osteoarthritis, right hip: Secondary | ICD-10-CM | POA: Diagnosis not present

## 2016-08-22 DIAGNOSIS — M1711 Unilateral primary osteoarthritis, right knee: Secondary | ICD-10-CM | POA: Insufficient documentation

## 2016-08-22 DIAGNOSIS — Z5181 Encounter for therapeutic drug level monitoring: Secondary | ICD-10-CM

## 2016-08-22 MED ORDER — HYDROCODONE-ACETAMINOPHEN 5-325 MG PO TABS: 1.0000 | ORAL_TABLET | Freq: Three times a day (TID) | ORAL | 0 refills | Status: DC

## 2016-08-22 NOTE — Progress Notes (Signed)
Subjective:    Patient ID: Kristopher [name], male    DOB: [birth year], 83 y.o.   MRN: [id number]  HPI: Kristopher [name] is a 83year old male who returns for follow up appointmentfor chronic pain and medication refill. He states his pain is located in his lower back ( occasionally)with activityand right knee. Heratehis pain 3. His current exercise regime is walking, performing stretching exercises and mowing with push mower.   Kristopher [name] last UDS was on 06/16/2016, it was consistent.  Pain Inventory Average Pain 3 Pain Right Now 3 My pain is constant, sharp and aching  In the last 24 hours, has pain interfered with the following? General activity 2 Relation with others 2 Enjoyment of life 2 What TIME of day is your pain at its worst? evening Sleep (in general) Fair  Pain is worse with: unsure Pain improves with: rest and medication Relief from Meds: 8  Mobility use a cane how many minutes can you walk? 15 ability to climb steps?  yes do you drive?  yes  Function retired  Neuro/Psych weakness numbness  Prior Studies Any changes since last visit?  no  Physicians involved in your care Any changes since last visit?  no   Family History  Problem Relation Age of Onset  . Heart disease Mother   . Heart disease Father    Social History   Social History  . Marital status: Widowed    Spouse name: N/A  . Number of children: N/A  . Years of education: GED   Occupational History  . retired    Social History Main Topics  . Smoking status: Former Smoker    Packs/day: 3.00    Years: 38.00    Quit date: 04/10/1988  . Smokeless tobacco: Never Used  . Alcohol use No     Comment: rarely  . Drug use: No  . Sexual activity: Yes   Other Topics Concern  . None   Social History Narrative  . None   Past Surgical History:  Procedure Laterality Date  . CATARACT EXTRACTION W/ INTRAOCULAR LENS  IMPLANT, BILATERAL  YRS AGO  . CORONARY ANGIOPLASTY WITH STENT  PLACEMENT    . [place]  . LOWER EXTREMITY ANGIOGRAM Bilateral 09/20/2011   Procedure: LOWER EXTREMITY ANGIOGRAM;  Surgeon: Kristopher Hampshire, MD;  Location: [facility];  Service: Cardiovascular;  Laterality: Bilateral;  . PCI of LAD  05/1996   STENT X 4 TOTAL  . TRANSURETHRAL RESECTION OF PROSTATE  08/11/2011   Procedure: TRANSURETHRAL RESECTION OF THE PROSTATE WITH GYRUS INSTRUMENTS;  Surgeon: Kristopher Bonine, MD;  Location: [facility];  Service: Urology;  Laterality: N/A;     . VASECTOMY  1968   Past Medical History:  Diagnosis Date  . AAA (abdominal aortic aneurysm) ([place])    ultrasound 07/2011:  2.7 x 2.8 cm  . Acute myocardial infarction, unspecified site, episode of care unspecified 1998  . Aortic stenosis    echo 4/12: normal LVF, mild LAE, no significant AS  . CAD (coronary artery disease)    s/p PCI of LAD 1998;  s/p DES to RCA 05/2009;  Myoview 4/13: low risk, no ischemia, EF 56%  . COPD (chronic obstructive pulmonary disease) ([place])   . DJD (degenerative joint disease), lumbar 07/06/2011  . Esophageal reflux   . HLD (hyperlipidemia)   . HTN (hypertension)   . PAD (peripheral artery disease) ([place])    s/p L CIA stent 09/2011 ([place])  .  Renal artery stenosis ([facility])    s/p bilat RA stents  . Urine incontinence    BP 122/73   Pulse 61   SpO2 94%   Opioid Risk Score:   Fall Risk Score:  `1  Depression screen PHQ 2/9  Depression screen [facility] 2/9 08/22/2016 05/05/2016 01/26/2016 07/27/2015 06/07/2015 01/22/2015 01/01/2015  Decreased Interest 0 0 0 0 0 0 0  Down, Depressed, Hopeless 0 0 0 0 0 0 0  PHQ - 2 Score 0 0 0 0 0 0 0  Altered sleeping - - - - - - -  Tired, decreased energy - - - - - - -  Change in appetite - - - - - - -  Feeling bad or failure about yourself  - - - - - - -  Trouble concentrating - - - - - - -  Moving slowly or fidgety/restless - - - - - - -  Suicidal thoughts - - - - - - -  PHQ-9 Score - - - - - - -    Review of Systems    Constitutional: Negative.   HENT: Negative.   Eyes: Negative.   Respiratory: Negative.   Cardiovascular: Negative.   Gastrointestinal: Negative.   Endocrine: Negative.   Genitourinary: Negative.   Musculoskeletal: Negative.   Skin: Negative.   Allergic/Immunologic: Negative.   Neurological: Positive for numbness.  Hematological: Negative.   Psychiatric/Behavioral: Negative.   All other systems reviewed and are negative.      Objective:   Physical Exam  Constitutional: He is oriented to person, place, and time. He appears well-developed and well-nourished.  HENT:  Head: Normocephalic and atraumatic.  Neck: Normal range of motion. Neck supple.  Cardiovascular: Normal rate and regular rhythm.   Pulmonary/Chest: Effort normal and breath sounds normal.  Musculoskeletal:  Normal Muscle Bulk and Muscle Testing Reveals: Upper Extremities: Full ROM and Muscle Strength 5/5 Lower Extremities: Full ROM and Muscle Strength 5/5 Right: Lower Extremity Flexion Produces Pain into Patella Arises from Table slowly using 3 prong cane  Antalgic Gait  Neurological: He is alert and oriented to person, place, and time.  Skin: Skin is warm and dry.  Psychiatric: He has a normal mood and affect.  Nursing note and vitals reviewed.         Assessment & Plan:  1. Patellofemoral arthritis. Continue Voltaren gel, and continue with exercise regimen. 08/22/2016 2 Chronic Low Back Pain/ Lumbar Stenosis with radiculopathy: 08/22/2016 3. Chronic Pain: Refilled: Hydrocodone 5/325 mg one tablet three times a day #90. 08/22/2016 We will continue the opioid monitoring program, this consists of regular clinic visits, examinations, urine drug screen, pill counts as well as use of [place] Controlled Substance reporting System.  20 minutes of face to face patient care time was spent during this visit. All questions were encouraged and answered.  F/U in 5weeks

## 2016-09-18 ENCOUNTER — Encounter: Payer: Medicare Other | Admitting: Registered Nurse

## 2016-09-19 ENCOUNTER — Encounter: Payer: Medicare Other | Attending: Registered Nurse | Admitting: Registered Nurse

## 2016-09-19 ENCOUNTER — Encounter: Payer: Self-pay | Admitting: Registered Nurse

## 2016-09-19 VITALS — BP 160/83 | HR 69

## 2016-09-19 DIAGNOSIS — G894 Chronic pain syndrome: Secondary | ICD-10-CM | POA: Diagnosis not present

## 2016-09-19 DIAGNOSIS — Z79899 Other long term (current) drug therapy: Secondary | ICD-10-CM | POA: Diagnosis not present

## 2016-09-19 DIAGNOSIS — M1611 Unilateral primary osteoarthritis, right hip: Secondary | ICD-10-CM | POA: Insufficient documentation

## 2016-09-19 DIAGNOSIS — M1711 Unilateral primary osteoarthritis, right knee: Secondary | ICD-10-CM | POA: Diagnosis not present

## 2016-09-19 DIAGNOSIS — M48062 Spinal stenosis, lumbar region with neurogenic claudication: Secondary | ICD-10-CM

## 2016-09-19 DIAGNOSIS — Z5181 Encounter for therapeutic drug level monitoring: Secondary | ICD-10-CM | POA: Insufficient documentation

## 2016-09-19 DIAGNOSIS — M25561 Pain in right knee: Secondary | ICD-10-CM

## 2016-09-19 DIAGNOSIS — I701 Atherosclerosis of renal artery: Secondary | ICD-10-CM

## 2016-09-19 MED ORDER — HYDROCODONE-ACETAMINOPHEN 5-325 MG PO TABS: 1.0000 | ORAL_TABLET | Freq: Three times a day (TID) | ORAL | 0 refills | Status: DC

## 2016-09-19 NOTE — Progress Notes (Signed)
Subjective:    Patient ID: Kristopher [name], male    DOB: 09-19-1930, 83 y.o.   MRN: [id number]  HPI:  Kristopher [name] is a 83year old male who returns for follow up appointmentfor chronic pain and medication refill. He states his pain is located in his lower back ( occasionally)with activityand right knee. Heratehis pain 1. His current exercise regime is walking,performing stretching exercises and mowing with push mower.   Kristopher [name] last UDS was on 06/16/2016, it was consistent.   Pain Inventory Average Pain 3 Pain Right Now 1 My pain is dull  In the last 24 hours, has pain interfered with the following? General activity 0 Relation with others 0 Enjoyment of life 0 What TIME of day is your pain at its worst? varies Sleep (in general) Fair  Pain is worse with: unsure Pain improves with: medication Relief from Meds: 8  Mobility walk with assistance use a cane ability to climb steps?  yes do you drive?  yes  Function retired Do you have any goals in this area?  no  Neuro/Psych No problems in this area  Prior Studies Any changes since last visit?  no  Physicians involved in your care Any changes since last visit?  no   Family History  Problem Relation Age of Onset  . Heart disease Mother   . Heart disease Father    Social History   Social History  . Marital status: Widowed    Spouse name: N/A  . Number of children: N/A  . Years of education: GED   Occupational History  . retired    Social History Main Topics  . Smoking status: Former Smoker    Packs/day: 3.00    Years: 38.00    Quit date: 04/10/1988  . Smokeless tobacco: Never Used  . Alcohol use No     Comment: rarely  . Drug use: No  . Sexual activity: Yes   Other Topics Concern  . None   Social History Narrative  . None   Past Surgical History:  Procedure Laterality Date  . CATARACT EXTRACTION W/ INTRAOCULAR LENS  IMPLANT, BILATERAL  YRS AGO  . CORONARY ANGIOPLASTY WITH STENT  PLACEMENT    . [name]  . LOWER EXTREMITY ANGIOGRAM Bilateral 09/20/2011   Procedure: LOWER EXTREMITY ANGIOGRAM;  Surgeon: Wellington Hampshire, MD;  Location: [facility];  Service: Cardiovascular;  Laterality: Bilateral;  . PCI of LAD  05/1996   STENT X 4 TOTAL  . TRANSURETHRAL RESECTION OF PROSTATE  08/11/2011   Procedure: TRANSURETHRAL RESECTION OF THE PROSTATE WITH GYRUS INSTRUMENTS;  Surgeon: Fredricka Bonine, MD;  Location: [facility];  Service: Urology;  Laterality: N/A;     . VASECTOMY  1968   Past Medical History:  Diagnosis Date  . AAA (abdominal aortic aneurysm) ([place])    ultrasound 07/2011:  2.7 x 2.8 cm  . Acute myocardial infarction, unspecified site, episode of care unspecified 1998  . Aortic stenosis    echo 4/12: normal LVF, mild LAE, no significant AS  . CAD (coronary artery disease)    s/p PCI of LAD 1998;  s/p DES to RCA 05/2009;  Myoview 4/13: low risk, no ischemia, EF 56%  . COPD (chronic obstructive pulmonary disease) ([place])   . DJD (degenerative joint disease), lumbar 07/06/2011  . Esophageal reflux   . HLD (hyperlipidemia)   . HTN (hypertension)   . PAD (peripheral artery disease) ([facility])    s/p L CIA  stent 09/2011 [name]  . Renal artery stenosis ([facility])    s/p bilat RA stents  . Urine incontinence    BP (!) 160/83   Pulse 69   SpO2 96%   Opioid Risk Score:   Fall Risk Score:  `1  Depression screen PHQ 2/9  Depression screen [facility] 2/9 08/22/2016 05/05/2016 01/26/2016 07/27/2015 06/07/2015 01/22/2015 01/01/2015  Decreased Interest 0 0 0 0 0 0 0  Down, Depressed, Hopeless 0 0 0 0 0 0 0  PHQ - 2 Score 0 0 0 0 0 0 0  Altered sleeping - - - - - - -  Tired, decreased energy - - - - - - -  Change in appetite - - - - - - -  Feeling bad or failure about yourself  - - - - - - -  Trouble concentrating - - - - - - -  Moving slowly or fidgety/restless - - - - - - -  Suicidal thoughts - - - - - - -  PHQ-9 Score - - - - - - -    Review of Systems    Constitutional: Negative.   HENT: Negative.   Eyes: Negative.   Respiratory: Negative.   Cardiovascular: Negative.   Gastrointestinal: Negative.   Endocrine: Negative.   Genitourinary: Negative.   Musculoskeletal: Positive for arthralgias.  Skin: Negative.   Allergic/Immunologic: Negative.   Neurological: Negative.   Hematological: Negative.   Psychiatric/Behavioral: Negative.   All other systems reviewed and are negative.      Objective:   Physical Exam  Constitutional: He is oriented to person, place, and time. He appears well-developed and well-nourished.  HENT:  Head: Normocephalic and atraumatic.  Neck: Normal range of motion. Neck supple.  Cardiovascular: Normal rate and regular rhythm.   Pulmonary/Chest: Effort normal and breath sounds normal.  Musculoskeletal:  Normal Muscle Bulk and Muscle Testing Reveals: Upper Extremities: Full ROM and Muscle Strength 5/5 Back without spinal tenderness noted Lower Extremities: Full ROM and Muscle Strength 5/5 Right Lower Extremity Flexion Produces Pain into Patella Arises from Table slowly using 4 prong cane for support Narrow Based Gait  Neurological: He is alert and oriented to person, place, and time.  Skin: Skin is warm and dry.  Psychiatric: He has a normal mood and affect.  Nursing note and vitals reviewed.         Assessment & Plan:  1. Patellofemoral arthritis. Continue Voltaren gel, and continue with exercise regimen. 09/19/2016 2 Chronic Low Back Pain/ Lumbar Stenosis with radiculopathy: 09/19/2016 3. Chronic Pain: Refilled: Hydrocodone 5/325 mg one tablet three times a day #90. 09/19/2016 We will continue the opioid monitoring program, this consists of regular clinic visits, examinations, urine drug screen, pill counts as well as use of [place] Controlled Substance reporting System.  20 minutes of face to face patient care time was spent during this visit. All questions were encouraged and  answered.   F/U in 1 month

## 2016-09-23 LAB — TOXASSURE SELECT,+ANTIDEPR,UR

## 2016-09-26 ENCOUNTER — Telehealth: Payer: Self-pay | Admitting: *Deleted

## 2016-09-26 NOTE — Telephone Encounter (Signed)
Urine drug screen for this encounter is consistent for prescribed medication 

## 2016-10-24 ENCOUNTER — Encounter: Payer: Self-pay | Admitting: Registered Nurse

## 2016-10-24 ENCOUNTER — Telehealth: Payer: Self-pay | Admitting: Registered Nurse

## 2016-10-24 ENCOUNTER — Encounter: Payer: Medicare Other | Attending: Registered Nurse | Admitting: Registered Nurse

## 2016-10-24 VITALS — BP 147/71 | HR 67 | Resp 14

## 2016-10-24 DIAGNOSIS — Z79899 Other long term (current) drug therapy: Secondary | ICD-10-CM | POA: Diagnosis not present

## 2016-10-24 DIAGNOSIS — I701 Atherosclerosis of renal artery: Secondary | ICD-10-CM | POA: Diagnosis not present

## 2016-10-24 DIAGNOSIS — M1711 Unilateral primary osteoarthritis, right knee: Secondary | ICD-10-CM | POA: Diagnosis not present

## 2016-10-24 DIAGNOSIS — Z5181 Encounter for therapeutic drug level monitoring: Secondary | ICD-10-CM | POA: Diagnosis not present

## 2016-10-24 DIAGNOSIS — M1611 Unilateral primary osteoarthritis, right hip: Secondary | ICD-10-CM | POA: Diagnosis not present

## 2016-10-24 DIAGNOSIS — G894 Chronic pain syndrome: Secondary | ICD-10-CM | POA: Insufficient documentation

## 2016-10-24 DIAGNOSIS — M25561 Pain in right knee: Secondary | ICD-10-CM | POA: Diagnosis not present

## 2016-10-24 MED ORDER — HYDROCODONE-ACETAMINOPHEN 5-325 MG PO TABS: 1.0000 | ORAL_TABLET | Freq: Three times a day (TID) | ORAL | 0 refills | Status: DC

## 2016-10-24 NOTE — Progress Notes (Signed)
Subjective:    Patient ID: [name], male    DOB: 16-Sep-1930, 83 y.o.   MRN: [id number]  HPI: [name] is a 83year old male who returns for follow up appointmentfor chronic pain and medication refill. He states his pain is located in his  right knee. Heratehis pain 2. His current exercise regime is walking,performing stretching exercises and mowing with push mower.   Mr. Lo last UDS was on 09/19/2016, it was consistent.   Pain Inventory Average Pain 3 Pain Right Now 2 My pain is constant  In the last 24 hours, has pain interfered with the following? General activity 0 Relation with others 0 Enjoyment of life 0 What TIME of day is your pain at its worst? all Sleep (in general) Fair  Pain is worse with: walking, standing and some activites Pain improves with: medication Relief from Meds: 9  Mobility walk with assistance use a cane ability to climb steps?  yes do you drive?  yes  Function retired  Neuro/Psych weakness  Prior Studies Any changes since last visit?  no  Physicians involved in your care Any changes since last visit?  no   Family History  Problem Relation Age of Onset  . Heart disease Mother   . Heart disease Father    Social History   Social History  . Marital status: Widowed    Spouse name: N/A  . Number of children: N/A  . Years of education: GED   Occupational History  . retired    Social History Main Topics  . Smoking status: Former Smoker    Packs/day: 3.00    Years: 38.00    Quit date: 04/10/1988  . Smokeless tobacco: Never Used  . Alcohol use No     Comment: rarely  . Drug use: No  . Sexual activity: Yes   Other Topics Concern  . None   Social History Narrative  . None   Past Surgical History:  Procedure Laterality Date  . CATARACT EXTRACTION W/ INTRAOCULAR LENS  IMPLANT, BILATERAL  YRS AGO  . CORONARY ANGIOPLASTY WITH STENT PLACEMENT    . [place]  . LOWER EXTREMITY ANGIOGRAM  Bilateral 09/20/2011   Procedure: LOWER EXTREMITY ANGIOGRAM;  Surgeon: Wellington Hampshire, MD;  Location: [facility];  Service: Cardiovascular;  Laterality: Bilateral;  . PCI of LAD  05/1996   STENT X 4 TOTAL  . TRANSURETHRAL RESECTION OF PROSTATE  08/11/2011   Procedure: TRANSURETHRAL RESECTION OF THE PROSTATE WITH GYRUS INSTRUMENTS;  Surgeon: Fredricka Bonine, MD;  [facility];  Service: Urology;  Laterality: N/A;     . VASECTOMY  1968   Past Medical History:  Diagnosis Date  . AAA (abdominal aortic aneurysm) ([place])    ultrasound 07/2011:  2.7 x 2.8 cm  . Acute myocardial infarction, unspecified site, episode of care unspecified 1998  . Aortic stenosis    echo 4/12: normal LVF, mild LAE, no significant AS  . CAD (coronary artery disease)    s/p PCI of LAD 1998;  s/p DES to RCA 05/2009;  Myoview 4/13: low risk, no ischemia, EF 56%  . COPD (chronic obstructive pulmonary disease) ([place])   . DJD (degenerative joint disease), lumbar 07/06/2011  . Esophageal reflux   . HLD (hyperlipidemia)   . HTN (hypertension)   . PAD (peripheral artery disease) ([place])    s/p L CIA stent 09/2011 ([place])  . Renal artery stenosis ([facility])    s/p bilat RA  stents  . Urine incontinence    BP (!) 147/71 (BP Location: Right Arm, Patient Position: Sitting, Cuff Size: Normal)   Pulse 67   Resp 14   SpO2 95%   Opioid Risk Score:   Fall Risk Score:  `1  Depression screen PHQ 2/9  Depression screen [facility] 2/9 08/22/2016 05/05/2016 01/26/2016 07/27/2015 06/07/2015 01/22/2015 01/01/2015  Decreased Interest 0 0 0 0 0 0 0  Down, Depressed, Hopeless 0 0 0 0 0 0 0  PHQ - 2 Score 0 0 0 0 0 0 0  Altered sleeping - - - - - - -  Tired, decreased energy - - - - - - -  Change in appetite - - - - - - -  Feeling bad or failure about yourself  - - - - - - -  Trouble concentrating - - - - - - -  Moving slowly or fidgety/restless - - - - - - -  Suicidal thoughts - - - - - - -  PHQ-9 Score - - - - - - -    Review of  Systems  HENT: Negative.   Eyes: Negative.   Respiratory: Negative.   Cardiovascular: Negative.   Gastrointestinal: Negative.   Endocrine: Negative.   Genitourinary: Negative.   Musculoskeletal: Positive for arthralgias.  Skin: Negative.   Allergic/Immunologic: Negative.   Neurological: Positive for weakness.  Hematological: Negative.   Psychiatric/Behavioral: Negative.   All other systems reviewed and are negative.      Objective:   Physical Exam  Constitutional: He is oriented to person, place, and time. He appears well-developed and well-nourished.  HENT:  Head: Normocephalic and atraumatic.  Neck: Normal range of motion. Neck supple.  Cardiovascular: Normal rate and regular rhythm.   Pulmonary/Chest: Effort normal and breath sounds normal.  Musculoskeletal:  Normal Muscle Bulk and Muscle Testing Reveals:  Upper Extremities: Full ROM and Muscle Strength 5/5 Lower Extremities: Full ROM and Muscle Strength 5/5 Right Lower Extremity Flexion Produces Pain into Patella Arises from Table with ease using walker for support Narrow Based Gait  Neurological: He is alert and oriented to person, place, and time.  Skin: Skin is warm and dry.  Psychiatric: He has a normal mood and affect.  Nursing note and vitals reviewed.         Assessment & Plan:  1. Patellofemoral arthritis. Continue Voltaren gel, and continue with exercise regimen. 10/24/2016 2 Chronic Low Back Pain/ Lumbar Stenosis: No complaints Today.  10/24/2016 3. Chronic Pain: Refilled: Hydrocodone 5/325 mg one tablet three times a day #90. 10/24/2016 We will continue the opioid monitoring program, this consists of regular clinic visits, examinations, urine drug screen, pill counts as well as use of [place] Controlled Substance reporting System.   15 minutes of face to face patient care time was spent during this visit. All questions were encouraged and answered.   F/U in 1 month

## 2016-10-24 NOTE — Telephone Encounter (Signed)
On 10/24/2016 the  [place] was reviewed no conflict was seen on the [place] with multiple prescribers. Mr. Kozak has a signed narcotic contract with our office. If there were any discrepancies this would have been reported to his physician.

## 2016-11-27 ENCOUNTER — Encounter (HOSPITAL_COMMUNITY): Payer: Self-pay

## 2016-11-27 ENCOUNTER — Emergency Department (HOSPITAL_COMMUNITY)
Admission: EM | Admit: 2016-11-27 | Discharge: 2016-11-27 | Disposition: A | Discharge status: Home / Self Care | Payer: Medicare Other | Attending: Emergency Medicine | Admitting: Emergency Medicine

## 2016-11-27 DIAGNOSIS — E785 Hyperlipidemia, unspecified: Secondary | ICD-10-CM | POA: Insufficient documentation

## 2016-11-27 DIAGNOSIS — Z87891 Personal history of nicotine dependence: Secondary | ICD-10-CM | POA: Diagnosis not present

## 2016-11-27 DIAGNOSIS — L03012 Cellulitis of left finger: Secondary | ICD-10-CM | POA: Diagnosis not present

## 2016-11-27 DIAGNOSIS — Z7982 Long term (current) use of aspirin: Secondary | ICD-10-CM | POA: Insufficient documentation

## 2016-11-27 DIAGNOSIS — L089 Local infection of the skin and subcutaneous tissue, unspecified: Secondary | ICD-10-CM

## 2016-11-27 DIAGNOSIS — I10 Essential (primary) hypertension: Secondary | ICD-10-CM | POA: Insufficient documentation

## 2016-11-27 DIAGNOSIS — I252 Old myocardial infarction: Secondary | ICD-10-CM | POA: Insufficient documentation

## 2016-11-27 DIAGNOSIS — J449 Chronic obstructive pulmonary disease, unspecified: Secondary | ICD-10-CM | POA: Insufficient documentation

## 2016-11-27 DIAGNOSIS — I441 Atrioventricular block, second degree: Secondary | ICD-10-CM | POA: Diagnosis not present

## 2016-11-27 DIAGNOSIS — Z79899 Other long term (current) drug therapy: Secondary | ICD-10-CM | POA: Diagnosis not present

## 2016-11-27 DIAGNOSIS — I2510 Atherosclerotic heart disease of native coronary artery without angina pectoris: Secondary | ICD-10-CM | POA: Insufficient documentation

## 2016-11-27 DIAGNOSIS — R600 Localized edema: Secondary | ICD-10-CM | POA: Diagnosis present

## 2016-11-27 LAB — CBC WITH DIFFERENTIAL/PLATELET
Basophils Absolute: 0.1 10*3/uL (ref 0.0–0.1)
Basophils Relative: 1 %
Eosinophils Absolute: 0.4 10*3/uL (ref 0.0–0.7)
Eosinophils Relative: 4 %
HCT: 38.5 % — ABNORMAL LOW (ref 39.0–52.0)
Hemoglobin: 13 g/dL (ref 13.0–17.0)
Lymphocytes Relative: 28 %
Lymphs Abs: 2.9 10*3/uL (ref 0.7–4.0)
MCH: 30.7 pg (ref 26.0–34.0)
MCHC: 33.8 g/dL (ref 30.0–36.0)
MCV: 90.8 fL (ref 78.0–100.0)
Monocytes Absolute: 0.8 10*3/uL (ref 0.1–1.0)
Monocytes Relative: 8 %
Neutro Abs: 6.3 10*3/uL (ref 1.7–7.7)
Neutrophils Relative %: 59 %
Platelets: 251 10*3/uL (ref 150–400)
RBC: 4.24 MIL/uL (ref 4.22–5.81)
RDW: 15 % (ref 11.5–15.5)
WBC: 11 10*3/uL — ABNORMAL HIGH (ref 4.0–10.5)

## 2016-11-27 LAB — COMPREHENSIVE METABOLIC PANEL
ALT: 27 U/L (ref 17–63)
AST: 33 U/L (ref 15–41)
Albumin: 3.4 g/dL — ABNORMAL LOW (ref 3.5–5.0)
Alkaline Phosphatase: 99 U/L (ref 38–126)
Anion gap: 7 (ref 5–15)
BUN: 34 mg/dL — ABNORMAL HIGH (ref 6–20)
CO2: 25 mmol/L (ref 22–32)
Calcium: 9.3 mg/dL (ref 8.9–10.3)
Chloride: 109 mmol/L (ref 101–111)
Creatinine, Ser: 1.37 mg/dL — ABNORMAL HIGH (ref 0.61–1.24)
GFR calc Af Amer: 53 mL/min — ABNORMAL LOW (ref >60)
GFR calc non Af Amer: 46 mL/min — ABNORMAL LOW (ref >60)
Glucose, Bld: 123 mg/dL — ABNORMAL HIGH (ref 65–99)
Potassium: 5.1 mmol/L (ref 3.5–5.1)
Sodium: 141 mmol/L (ref 135–145)
Total Bilirubin: 0.5 mg/dL (ref 0.3–1.2)
Total Protein: 6.9 g/dL (ref 6.5–8.1)

## 2016-11-27 LAB — I-STAT CG4 LACTIC ACID, ED: Lactic Acid, Venous: 1.73 mmol/L (ref 0.5–1.9)

## 2016-11-27 MED ORDER — CLINDAMYCIN HCL 300 MG PO CAPS: 300.0000 mg | ORAL_CAPSULE | Freq: Three times a day (TID) | ORAL | 0 refills | Status: DC

## 2016-11-27 MED ORDER — CLINDAMYCIN HCL 150 MG PO CAPS
300.0000 mg | ORAL_CAPSULE | Freq: Once | ORAL | Status: AC
  Administered 2016-11-27: 300 mg via ORAL
  Filled 2016-11-27: qty 2

## 2016-11-27 MED ORDER — LIDOCAINE HCL (PF) 1 % IJ SOLN
5.0000 mL | Freq: Once | INTRAMUSCULAR | Status: AC
  Administered 2016-11-27: 5 mL
  Filled 2016-11-27: qty 5

## 2016-11-27 NOTE — ED Provider Notes (Signed)
[facility] DEPT Provider Note   CSN: [id number] Arrival date & time: 11/27/16  1052   By signing my name below, I, Eunice Blase, attest that this documentation has been prepared under the direction and in the presence of Virgel Manifold, MD. Electronically signed, Eunice Blase, ED Scribe. 11/27/16. 12:35 PM.   History   Chief Complaint Chief Complaint  Patient presents with  . Finger Injury   The history is provided by the patient and medical records. No language interpreter was used.    Kristopher [name] is a 83 y.o. male presenting to the Emergency Department concerning recurrent painful swelling to the L 3rd digit x ~3 years.Warmth and redness associated. Pt reports recurrent abscesses to the area every 3 months since an injury reportedly 3 years ago. He adds he typically drains them, but he cannot drain the raised, indurated area this time. He reports he bites his nails. No other complaints at this time.   Past Medical History:  Diagnosis Date  . AAA (abdominal aortic aneurysm) ([place])    ultrasound 07/2011:  2.7 x 2.8 cm  . Acute myocardial infarction, unspecified site, episode of care unspecified 1998  . Aortic stenosis    echo 4/12: normal LVF, mild LAE, no significant AS  . CAD (coronary artery disease)    s/p PCI of LAD 1998;  s/p DES to RCA 05/2009;  Myoview 4/13: low risk, no ischemia, EF 56%  . COPD (chronic obstructive pulmonary disease) ([place])   . DJD (degenerative joint disease), lumbar 07/06/2011  . Esophageal reflux   . HLD (hyperlipidemia)   . HTN (hypertension)   . PAD (peripheral artery disease) ([place])    s/p L CIA stent 09/2011 ([place])  . Renal artery stenosis ([facility])    s/p bilat RA stents  . Urine incontinence     Patient Active Problem List   Diagnosis Date Noted  . Spinal stenosis, lumbar region, with neurogenic claudication 08/31/2014  . Right lumbar radiculitis 08/04/2014  . Chronic low back pain 07/02/2014  . RBBB 06/18/2014  . Patellofemoral  arthralgia of right knee 03/24/2014  . Osteoarthritis of right hip 03/24/2014  . Dupuytren's contracture of both hands 03/24/2014  . Ankle impingement syndrome 11/06/2013  . Liver mass 10/21/2013  . Low back pain 08/31/2013  . AV block, 2nd degree 08/31/2013  . Chronic pain syndrome 04/17/2013  . Right hand pain 07/11/2012  . Peripheral artery disease ([place]) 10/04/2011  . COPD (chronic obstructive pulmonary disease) ([place]) 07/06/2011  . DJD (degenerative joint disease), lumbar 07/06/2011  . Preventative health care 07/06/2011  . Abdominal pain 05/25/2011  . Pancreatitis 05/24/2011  . Abdominal aortic aneurysm ([place]) 06/13/2010  . Hyperlipidemia 05/18/2009  . Essential hypertension 05/18/2009  . MI 05/18/2009  . CAD S/P LAD PCI 1998, RCA DES 2011 05/18/2009  . AORTIC STENOSIS 05/18/2009  . RENAL ARTERY STENOSIS 05/18/2009  . Peripheral vascular disease ([place]) 05/18/2009  . GERD 05/18/2009  . DYSPNEA 05/18/2009    Past Surgical History:  Procedure Laterality Date  . CATARACT EXTRACTION W/ INTRAOCULAR LENS  IMPLANT, BILATERAL  YRS AGO  . CORONARY ANGIOPLASTY WITH STENT PLACEMENT    . [place]  . LOWER EXTREMITY ANGIOGRAM Bilateral 09/20/2011   Procedure: LOWER EXTREMITY ANGIOGRAM;  Surgeon: Wellington Hampshire, MD;  Location: [facility]ATH [facility];  Service: Cardiovascular;  Laterality: Bilateral;  . PCI of LAD  05/1996   STENT X 4 TOTAL  . TRANSURETHRAL RESECTION OF PROSTATE  08/11/2011   Procedure: TRANSURETHRAL RESECTION OF  THE PROSTATE WITH GYRUS INSTRUMENTS;  Surgeon: Fredricka Bonine, MD;  Location[facility];  Service: Urology;  Laterality: N/A;     . [place] Medications    Prior to Admission medications   Medication Sig Start Date End Date Taking? Authorizing Provider  Ascorbic Acid (VITAMIN C) 500 MG tablet Take 500 mg by mouth 2 (two) times daily.     [provider]  aspirin 325 MG EC tablet Take 325 mg by mouth daily.    [provider]  atorvastatin (LIPITOR) 80 MG tablet Take 1 tablet (80 mg total) by mouth daily. 07/24/16   Lelon Perla, MD  BEE POLLEN PO Take by mouth.    [provider]  Co-Enzyme Q-10 100 MG CAPS Take 2 capsules by mouth daily.     [provider]  diclofenac sodium (VOLTAREN) 1 % GEL Apply 2 g topically 4 (four) times daily. Patient taking differently: Apply 2 g topically 4 (four) times daily as needed.  10/09/13   Kirsteins, Luanna Salk, MD  Docusate Calcium (STOOL SOFTENER PO) Take 2 capsules by mouth daily.    [provider]  fish oil-omega-3 fatty acids 1000 MG capsule Take 1 g by mouth daily.     [provider]  HYDROcodone-acetaminophen (NORCO/VICODIN) 5-325 MG tablet Take 1 tablet by mouth 3 (three) times daily before meals. 10/24/16   Bayard Hugger, NP  Multiple Vitamin (MULTIVITAMIN) capsule Take 1 capsule by mouth daily.     [provider]  Multiple Vitamins-Minerals (PRESERVISION AREDS PO) Take 2 tablets by mouth daily.    [provider]  nitroGLYCERIN (NITROSTAT) 0.4 MG SL tablet Place 1 tablet (0.4 mg total) under the tongue every 5 (five) minutes as needed for chest pain. 07/14/14   Lelon Perla, MD  omeprazole (PRILOSEC) 20 MG capsule TAKE 2 CAPSULES DAILY 07/03/16   [name], MD  ramipril (ALTACE) 10 MG capsule Take 1 capsule (10 mg total) by mouth daily. 07/04/16   Lelon Perla, MD    Family History Family History  Problem Relation Age of Onset  . Heart disease Mother   . Heart disease Father     Social History Social History  Substance Use Topics  . Smoking status: Former Smoker    Packs/day: 3.00    Years: 38.00    Quit date: 04/10/1988  . Smokeless tobacco: Never Used  . Alcohol use No     Comment: rarely     Allergies   Influenza vaccines and Sulfa antibiotics   Review of Systems Review of Systems  Constitutional: Negative for chills, diaphoresis and fever.  Gastrointestinal:  Negative for abdominal pain, nausea and vomiting.  Musculoskeletal: Positive for myalgias. Negative for arthralgias.  Skin: Positive for color change. Negative for wound.  Neurological: Negative for weakness and numbness.     Physical Exam Updated Vital Signs BP (!) 104/93 (BP Location: Right Arm)   Pulse 80   Temp 97.7 F (36.5 C) (Oral)   Ht 5\' 9"  (1.753 m)   Wt 140 lb (63.5 kg)   SpO2 90%   BMI 20.67 kg/m   Physical Exam  Constitutional: He is oriented to person, place, and time. He appears well-developed and well-nourished.  HENT:  Head: Normocephalic.  Eyes: EOM are normal.  Neck: Normal range of motion.  Pulmonary/Chest: Effort normal.  Abdominal: He exhibits no distension.  Musculoskeletal: Normal range of motion.  Neurological: He  is alert and oriented to person, place, and time.  Skin: There is erythema.  Paronychia L middle finger. Finger tip is soft.  Psychiatric: He has a normal mood and affect.  Nursing note and vitals reviewed.    ED Treatments / Results  DIAGNOSTIC STUDIES: Oxygen Saturation is 90% on RA, low by my interpretation.    COORDINATION OF CARE: 12:32 PM-Discussed next steps with pt. Pt verbalized understanding and is agreeable with the plan. Pt prepared for I&D. Will Rx medications.   Labs (all labs ordered are listed, but only abnormal results are displayed) Labs Reviewed  COMPREHENSIVE METABOLIC PANEL  CBC WITH DIFFERENTIAL/PLATELET  I-STAT CG4 LACTIC ACID, ED    EKG  EKG Interpretation None       Radiology No results found.  Procedures .Nerve Block Date/Time: 11/27/2016 1:34 PM Performed by: Virgel Manifold Authorized by: Virgel Manifold   Consent:    Consent obtained:  Verbal   Consent given by:  Patient Indications:    Indications:  Pain relief Location:    Body area:  Upper extremity   Upper extremity nerve:  Metacarpal   Laterality:  Left Procedure details (see MAR for exact dosages):    Needle gauge: 27 G.    Anesthetic injected:  Lidocaine 1% WITH epi   Additive injected:  None   Injection procedure:  Anatomic landmarks identified, anatomic landmarks palpated, introduced needle, incremental injection and negative aspiration for blood   Paresthesia:  None Post-procedure details:    Dressing:  None   Outcome:  Anesthesia achieved   Patient tolerance of procedure:  Tolerated well, no immediate complications Comments:     Minimal bleeding. No pus.   (including critical care time)  Medications Ordered in ED Medications - No data to display   Initial Impression / Assessment and Plan / ED Course  I have reviewed the triage vital signs and the nursing notes.  Pertinent labs & imaging results that were available during my care of the patient were reviewed by me and considered in my medical decision making (see chart for details).     83 year old male with finger infection. He clinically appeared to be a paronychia. I was unable to get any significant drainage on I&D though. Continued wound care was discussed. Antibiotics.  Final Clinical Impressions(s) / ED Diagnoses   Final diagnoses:  Finger infection    New Prescriptions New Prescriptions   No medications on file    I personally preformed the services scribed in my presence. The recorded information has been reviewed is accurate. Virgel Manifold, MD.    Virgel Manifold, MD 12/13/16 [id number]

## 2016-11-27 NOTE — Discharge Instructions (Signed)
Continue warm soaks at home. Take antibiotics until finished. If not improvement in ~24 hours or worsening, then you need re-checked.

## 2016-11-27 NOTE — ED Triage Notes (Signed)
Pt states he has recurrent infection in his left middle finger. Pt states he normally drains it and puts peroxide on it but this time it is not improving. Redness and swelling noted. Hot to touch. Pt afebrile.

## 2016-11-27 NOTE — ED Notes (Signed)
ED Provider at bedside. 

## 2016-11-28 ENCOUNTER — Encounter: Payer: Medicare Other | Attending: Registered Nurse | Admitting: Registered Nurse

## 2016-11-28 ENCOUNTER — Encounter: Payer: Self-pay | Admitting: Registered Nurse

## 2016-11-28 VITALS — BP 110/69 | HR 81

## 2016-11-28 DIAGNOSIS — Z5181 Encounter for therapeutic drug level monitoring: Secondary | ICD-10-CM | POA: Diagnosis not present

## 2016-11-28 DIAGNOSIS — M62838 Other muscle spasm: Secondary | ICD-10-CM | POA: Diagnosis not present

## 2016-11-28 DIAGNOSIS — Z79899 Other long term (current) drug therapy: Secondary | ICD-10-CM | POA: Diagnosis not present

## 2016-11-28 DIAGNOSIS — G894 Chronic pain syndrome: Secondary | ICD-10-CM | POA: Diagnosis not present

## 2016-11-28 DIAGNOSIS — M1611 Unilateral primary osteoarthritis, right hip: Secondary | ICD-10-CM | POA: Diagnosis not present

## 2016-11-28 DIAGNOSIS — M48062 Spinal stenosis, lumbar region with neurogenic claudication: Secondary | ICD-10-CM | POA: Diagnosis not present

## 2016-11-28 DIAGNOSIS — M1711 Unilateral primary osteoarthritis, right knee: Secondary | ICD-10-CM | POA: Diagnosis not present

## 2016-11-28 DIAGNOSIS — M25561 Pain in right knee: Secondary | ICD-10-CM

## 2016-11-28 DIAGNOSIS — I701 Atherosclerosis of renal artery: Secondary | ICD-10-CM | POA: Diagnosis not present

## 2016-11-28 MED ORDER — HYDROCODONE-ACETAMINOPHEN 5-325 MG PO TABS: 1.0000 | ORAL_TABLET | Freq: Three times a day (TID) | ORAL | 0 refills | Status: DC

## 2016-11-28 MED ORDER — CYCLOBENZAPRINE HCL 5 MG PO TABS: ORAL_TABLET | ORAL | 0 refills | Status: DC

## 2016-11-28 NOTE — Progress Notes (Signed)
Subjective:    Patient ID: Kristopher [name], male    DOB: 10-23-1930, 83 y.o.   MRN: [id number]  HPI: Kristopher [name] is a 83year old male who returns for follow up appointmentfor chronic pain and medication refill. He states his pain is located in his left finger ( 3rd digit) right leg and  right knee. Heratehis pain 2. His current exercise regime is walking,performing stretching exercises and mowing with push mower.   Kristopher [name] states he has been having increase  Frequency of  muscle spasms in his right leg, we will prescribe Flexeril 2.5 mg, he was instructed to take at HS only as needed and not to take with the Hydrocodone, he verbalizes understanding. Instructions given and discussed with Dr. Letta Pate agrees with plan.   Kristopher [name] reports he was bitten by a spider on last Thursday 11/23/2016, he went to [facility] ED on 11/27/2016. I & D was performed and he was prescribed clindamycin.   Kristopher [name] last UDS was on 09/19/2016, it was consistent.  Pain Inventory Average Pain 3 Pain Right Now 2 My pain is intermittent, burning and aching  In the last 24 hours, has pain interfered with the following? General activity 3 Relation with others 3 Enjoyment of life 3 What TIME of day is your pain at its worst? evening Sleep (in general) Good  Pain is worse with: walking, standing and some activites Pain improves with: rest and medication Relief from Meds: 9  Mobility walk with assistance use a cane ability to climb steps?  yes do you drive?  yes  Function retired  Neuro/Psych numbness tingling spasms  Prior Studies Any changes since last visit?  no  Physicians involved in your care Any changes since last visit?  no   Family History  Problem Relation Age of Onset  . Heart disease Mother   . Heart disease Father    Social History   Social History  . Marital status: Widowed    Spouse name: N/A  . Number of children: N/A  . Years of education: GED    Occupational History  . retired    Social History Main Topics  . Smoking status: Former Smoker    Packs/day: 3.00    Years: 38.00    Quit date: 04/10/1988  . Smokeless tobacco: Never Used  . Alcohol use No     Comment: rarely  . Drug use: No  . Sexual activity: Yes   Other Topics Concern  . Not on file   Social History Narrative  . No narrative on file   Past Surgical History:  Procedure Laterality Date  . CATARACT EXTRACTION W/ INTRAOCULAR LENS  IMPLANT, BILATERAL  YRS AGO  . CORONARY ANGIOPLASTY WITH STENT PLACEMENT    . [place]  . LOWER EXTREMITY ANGIOGRAM Bilateral 09/20/2011   Procedure: LOWER EXTREMITY ANGIOGRAM;  Surgeon: Wellington Hampshire, MD;  Location: [facility] CATH LAB;  Service: Cardiovascular;  Laterality: Bilateral;  . PCI of LAD  05/1996   STENT X 4 TOTAL  . TRANSURETHRAL RESECTION OF PROSTATE  08/11/2011   Procedure: TRANSURETHRAL RESECTION OF THE PROSTATE WITH GYRUS INSTRUMENTS;  Surgeon: Fredricka Bonine, MD;  Location: [facility];  Service: Urology;  Laterality: N/A;     . VASECTOMY  1968   Past Medical History:  Diagnosis Date  . AAA (abdominal aortic aneurysm) ([place])    ultrasound 07/2011:  2.7 x 2.8 cm  . Acute myocardial infarction, unspecified site, episode of  care unspecified 1998  . Aortic stenosis    echo 4/12: normal LVF, mild LAE, no significant AS  . CAD (coronary artery disease)    s/p PCI of LAD 1998;  s/p DES to RCA 05/2009;  Myoview 4/13: low risk, no ischemia, EF 56%  . COPD (chronic obstructive pulmonary disease) ([place])   . DJD (degenerative joint disease), lumbar 07/06/2011  . Esophageal reflux   . HLD (hyperlipidemia)   . HTN (hypertension)   . PAD (peripheral artery disease) ([place])    s/p L CIA stent 09/2011 ([place])  . Renal artery stenosis ([facility])    s/p bilat RA stents  . Urine incontinence    There were no vitals taken for this visit.  Opioid Risk Score:   Fall Risk Score:  `1  Depression screen PHQ  2/9  Depression screen [facility] 2/9 08/22/2016 05/05/2016 01/26/2016 07/27/2015 06/07/2015 01/22/2015 01/01/2015  Decreased Interest 0 0 0 0 0 0 0  Down, Depressed, Hopeless 0 0 0 0 0 0 0  PHQ - 2 Score 0 0 0 0 0 0 0  Altered sleeping - - - - - - -  Tired, decreased energy - - - - - - -  Change in appetite - - - - - - -  Feeling bad or failure about yourself  - - - - - - -  Trouble concentrating - - - - - - -  Moving slowly or fidgety/restless - - - - - - -  Suicidal thoughts - - - - - - -  PHQ-9 Score - - - - - - -     Review of Systems  Constitutional: Negative.   HENT: Negative.   Eyes: Negative.   Respiratory: Negative.   Cardiovascular: Negative.   Gastrointestinal: Negative.   Endocrine: Negative.   Genitourinary: Negative.   Musculoskeletal: Positive for gait problem and joint swelling.  Skin: Negative.   Allergic/Immunologic: Negative.   Hematological: Negative.   Psychiatric/Behavioral: Negative.   All other systems reviewed and are negative.      Objective:   Physical Exam  Constitutional: He is oriented to person, place, and time. He appears well-developed and well-nourished.  HENT:  Head: Normocephalic and atraumatic.  Neck: Normal range of motion. Neck supple.  Cardiovascular: Normal rate and regular rhythm.   Pulmonary/Chest: Effort normal and breath sounds normal.  Neurological: He is alert and oriented to person, place, and time.  Skin: Skin is warm and dry. There is erythema.  Left Finger (Third Digit) with swelling and redness noted  Psychiatric: He has a normal mood and affect.  Nursing note and vitals reviewed.         Assessment & Plan:  1. Patellofemoral arthritis. Continue Voltaren gel, and continue with exercise regimen. 11/28/2016 2 Chronic Low Back Pain/ Lumbar Stenosis: No complaints Today.  11/28/2016 3. Chronic Pain: Refilled: Hydrocodone 5/325 mg one tablet three times a day #90. 10/24/2016 We will continue the opioid monitoring program,  this consists of regular clinic visits, examinations, urine drug screen, pill counts as well as use of [place] Controlled Substance reporting System. 4. Left Finger Pain: Went to ED on 11/27/2016: I& D was performed he was prescribed Clindamycin.  5. Muscle Spasm: RX: Flexeril 2.5 mg HS as needed: Instructions Given.  20 minutes of face to face patient care time was spent during this visit. All questions were encouraged and answered.   F/U in 1 month

## 2016-11-28 NOTE — Patient Instructions (Signed)
Flexeril 5 mg has been prescribed  for muscle Spasm : Only Take a half tablet !!!!!!!!  at Bedtime as needed for Muscle Spasm.   Call office on Thursday 08/23 or Friday 12/01/2016.    Call with  Any questions or concerns: [phone]

## 2016-12-01 ENCOUNTER — Telehealth: Payer: Self-pay | Admitting: *Deleted

## 2016-12-01 NOTE — Telephone Encounter (Signed)
Patient called to report that the 1/2 tablet of cyclobenzaprine was helping a little, but not quite enough. He wanted to speak to [place].  I consulted Zella Ball and she asked ,me to call the patient back and instruct to take 1 tablet instead.   I passed this on to the patient and asked him to call back Monday to report effect

## 2016-12-08 ENCOUNTER — Ambulatory Visit (INDEPENDENT_AMBULATORY_CARE_PROVIDER_SITE_OTHER): Payer: Medicare Other | Admitting: Family Medicine

## 2016-12-08 ENCOUNTER — Encounter: Payer: Self-pay | Admitting: Family Medicine

## 2016-12-08 VITALS — BP 120/70 | HR 68 | Ht 69.0 in | Wt 143.0 lb

## 2016-12-08 DIAGNOSIS — L03012 Cellulitis of left finger: Secondary | ICD-10-CM

## 2016-12-08 MED ORDER — AMOXICILLIN-POT CLAVULANATE 875-125 MG PO TABS: 1.0000 | ORAL_TABLET | Freq: Two times a day (BID) | ORAL | 0 refills | Status: DC

## 2016-12-08 NOTE — Patient Instructions (Addendum)
Thank you for coming in,   Please try to soak your finger with warm water and soap.  Please apply neosporin after each warm soak. Warm soaks should last 10 to 15 minutes. Please take a probiotic with the antibiotic.    Please feel free to call with any questions or concerns at any time, at [phone]. --Dr. Raeford Razor

## 2016-12-08 NOTE — Progress Notes (Addendum)
Kristopher [name] - 83 y.o. male MRN [id number]  Date of birth: 1930-11-12  SUBJECTIVE:  Including CC & ROS.  Chief Complaint  Patient presents with  . Hospitalization Follow-up    when using a grinder hit the end of his finger went to hospital. was put on clindamycin ran out and wants more     Kristopher [name] is presenting with acute change of his middle finger on his left hand. His pain started yesterday. He had an I&D and completed antibiotics and had resolution of his finger pain. His pain, redness and swelling returned two days ago. He denies any fever. He had another episode in June in which he drained it himself. He denies any trauma or history of gout. Pain with touching and movement of his finger. He is in pain management for chronic pain and takes pain medication for that.    He was seen in the emergency department on 8/20. He was seen there for recurrent painful swelling of the left third digit 3 years. A 91 performed during that visit and clindamycin was dry.  Review of Systems  Constitutional: Negative for fever.  Musculoskeletal: Positive for arthralgias, gait problem and joint swelling.  Skin: Positive for color change.  Neurological: Negative for weakness and numbness.  Hematological: Negative for adenopathy.  otherwise negative   HISTORY: Past Medical, Surgical, Social, and Family History Reviewed & Updated per EMR.   Pertinent Historical Findings include:  Past Medical History:  Diagnosis Date  . AAA (abdominal aortic aneurysm) ([place])    ultrasound 07/2011:  2.7 x 2.8 cm  . Acute myocardial infarction, unspecified site, episode of care unspecified 1998  . Aortic stenosis    echo 4/12: normal LVF, mild LAE, no significant AS  . CAD (coronary artery disease)    s/p PCI of LAD 1998;  s/p DES to RCA 05/2009;  Myoview 4/13: low risk, no ischemia, EF 56%  . COPD (chronic obstructive pulmonary disease) ([place])   . DJD (degenerative joint disease), lumbar 07/06/2011  . Esophageal reflux    . HLD (hyperlipidemia)   . HTN (hypertension)   . PAD (peripheral artery disease) ([place])    s/p L CIA stent 09/2011 ([place])  . Renal artery stenosis ([facility])    s/p bilat RA stents  . Urine incontinence     Past Surgical History:  Procedure Laterality Date  . CATARACT EXTRACTION W/ INTRAOCULAR LENS  IMPLANT, BILATERAL  YRS AGO  . CORONARY ANGIOPLASTY WITH STENT PLACEMENT    . [place]  . LOWER EXTREMITY ANGIOGRAM Bilateral 09/20/2011   Procedure: LOWER EXTREMITY ANGIOGRAM;  Surgeon: Wellington Hampshire, MD;  Location: [place] CATH LAB;  Service: Cardiovascular;  Laterality: Bilateral;  . PCI of LAD  05/1996   STENT X 4 TOTAL  . TRANSURETHRAL RESECTION OF PROSTATE  08/11/2011   Procedure: TRANSURETHRAL RESECTION OF THE PROSTATE WITH GYRUS INSTRUMENTS;  Surgeon: Fredricka Bonine, MD;  Location[facility];  Service: Urology;  Laterality: N/A;     . VASECTOMY  1968    Allergies  Allergen Reactions  . Influenza Vaccines Other (See Comments)    unknown  . Sulfa Antibiotics Rash    Family History  Problem Relation Age of Onset  . Heart disease Mother   . Heart disease Father      Social History   Social History  . Marital status: Widowed    Spouse name: N/A  . Number of children: N/A  . Years of education: GED  Occupational History  . retired    Social History Main Topics  . Smoking status: Former Smoker    Packs/day: 3.00    Years: 38.00    Quit date: 04/10/1988  . Smokeless tobacco: Never Used  . Alcohol use No     Comment: rarely  . Drug use: No  . Sexual activity: Yes   Other Topics Concern  . Not on file   Social History Narrative  . No narrative on file     PHYSICAL EXAM:  VS: BP 120/70 (BP Location: Left Arm, Patient Position: Sitting, Cuff Size: Normal)   Pulse 68   Ht 5\' 9"  (1.753 m)   Wt 143 lb (64.9 kg)   SpO2 95%   BMI 21.12 kg/m  Physical Exam Gen: NAD, alert, cooperative with exam,  ENT: normal lips, normal nasal mucosa,  Eye:  normal EOM, normal conjunctiva and lids CV:  no edema, +2 pedal pulses   Resp: no accessory muscle use, non-labored,  Skin: redness and swelling of the third finger on left hand  Neuro: normal tone, normal sensation to touch Psych:  normal insight, alert and oriented MSK:  Left hand:  Third finger with redness on the dorsal aspect just proximal to the nail bed to suggest a paronychia  No fluctuance  TTP of the DIP  Able to flex and extend  Neurovascularly intact   Limited ultrasound: left 3rd finger:  Soft tissue swelling present  Area of tenderness doesn't demonstrate an abscess  DIP with arthritic chances   Summary: soft tissue swelling   Ultrasound and interpretation by Clearance Coots, MD       ASSESSMENT & PLAN:   Paronychia of left middle finger No abscess to drain on [place]. This seems to be re-occurring. Has completed clindamycin  - augmentin sent  - provided instructions on care  - given indications to return.

## 2016-12-10 DIAGNOSIS — L03012 Cellulitis of left finger: Secondary | ICD-10-CM | POA: Insufficient documentation

## 2016-12-10 NOTE — Assessment & Plan Note (Signed)
No abscess to drain on [place]. This seems to be re-occurring. Has completed clindamycin  - augmentin sent  - provided instructions on care  - given indications to return.

## 2016-12-13 ENCOUNTER — Telehealth: Payer: Self-pay | Admitting: Registered Nurse

## 2016-12-13 MED ORDER — CYCLOBENZAPRINE HCL 5 MG PO TABS: 5.0000 mg | ORAL_TABLET | Freq: Every evening | ORAL | 2 refills | Status: DC | PRN

## 2016-12-13 NOTE — Telephone Encounter (Signed)
Flexeril reordered

## 2016-12-18 ENCOUNTER — Other Ambulatory Visit (INDEPENDENT_AMBULATORY_CARE_PROVIDER_SITE_OTHER): Payer: Medicare Other

## 2016-12-18 ENCOUNTER — Ambulatory Visit (INDEPENDENT_AMBULATORY_CARE_PROVIDER_SITE_OTHER): Payer: Medicare Other | Admitting: Family Medicine

## 2016-12-18 ENCOUNTER — Telehealth: Payer: Self-pay | Admitting: Family Medicine

## 2016-12-18 ENCOUNTER — Encounter: Payer: Self-pay | Admitting: Family Medicine

## 2016-12-18 VITALS — BP 136/82 | HR 78 | Temp 94.5°F | Ht 69.0 in | Wt 145.0 lb

## 2016-12-18 DIAGNOSIS — L03012 Cellulitis of left finger: Secondary | ICD-10-CM | POA: Diagnosis not present

## 2016-12-18 DIAGNOSIS — M7989 Other specified soft tissue disorders: Secondary | ICD-10-CM | POA: Diagnosis not present

## 2016-12-18 LAB — CBC WITH DIFFERENTIAL/PLATELET
Basophils Absolute: 0.1 10*3/uL (ref 0.0–0.1)
Basophils Relative: 1.2 % (ref 0.0–3.0)
Eosinophils Absolute: 0.5 10*3/uL (ref 0.0–0.7)
Eosinophils Relative: 5.3 % — ABNORMAL HIGH (ref 0.0–5.0)
HCT: 40.5 % (ref 39.0–52.0)
Hemoglobin: 13.3 g/dL (ref 13.0–17.0)
Lymphocytes Relative: 28.4 % (ref 12.0–46.0)
Lymphs Abs: 2.4 10*3/uL (ref 0.7–4.0)
MCHC: 32.8 g/dL (ref 30.0–36.0)
MCV: 93.3 fl (ref 78.0–100.0)
Monocytes Absolute: 0.8 10*3/uL (ref 0.1–1.0)
Monocytes Relative: 9 % (ref 3.0–12.0)
Neutro Abs: 4.8 10*3/uL (ref 1.4–7.7)
Neutrophils Relative %: 56.1 % (ref 43.0–77.0)
Platelets: 230 10*3/uL (ref 150.0–400.0)
RBC: 4.34 Mil/uL (ref 4.22–5.81)
RDW: 15.3 % (ref 11.5–15.5)
WBC: 8.6 10*3/uL (ref 4.0–10.5)

## 2016-12-18 LAB — SEDIMENTATION RATE: Sed Rate: 5 mm/hr (ref 0–20)

## 2016-12-18 LAB — C-REACTIVE PROTEIN: CRP: <0.1 mg/dL — ABNORMAL LOW (ref 0.5–20.0)

## 2016-12-18 MED ORDER — ACYCLOVIR 400 MG PO TABS: 800.0000 mg | ORAL_TABLET | Freq: Two times a day (BID) | ORAL | 0 refills | Status: DC

## 2016-12-18 NOTE — Patient Instructions (Signed)
Thank you for coming in,   Please try this new medication.   Please follow up with me on Friday is no improvement.    Please feel free to call with any questions or concerns at any time, at [phone]. --Dr. Raeford Razor

## 2016-12-18 NOTE — Assessment & Plan Note (Signed)
Possible to have an autoimmune problem  - RF, Anti-CCP, ESR, CRP 

## 2016-12-18 NOTE — Progress Notes (Signed)
Kristopher [name] - 83 y.o. male MRN [id number]  Date of birth: 06-May-1930  SUBJECTIVE:  Including CC & ROS.  Chief Complaint  Patient presents with  . Hand Pain    Patient is here today C/O left 3rd digit pain and swelling.  He was seen here on 8.31.18 then seen at [facility] on 8.20.18. He was given Clindamycin in hospital then was given Augmentin here and finished the course of Augmentin this am.    [name] is an 83 year old male that is presenting with left third DIP swelling and redness. He has been placed on clindamycin and Augmentin with some improvement but his symptoms returned after the completion of antibiotics. He has been soaking his finger and salt water with some improvement. Denies any trauma or injury. Denies any fever or chills. Denies any streaking or radiation proximally. He denies any history of gout. He does have arthritis in several joints and is followed by chronic pain management.  He was seen by me on 8/31 and was started on Augmentin. He was seen in the emergency room on 8/20 and then placed on clindamycin.     Review of Systems  Constitutional: Negative for fever.  Musculoskeletal: Positive for arthralgias, gait problem and joint swelling.  Skin: Positive for rash.  Neurological: Negative for weakness and numbness.    HISTORY: Past Medical, Surgical, Social, and Family History Reviewed & Updated per EMR.   Pertinent Historical Findings include:  Past Medical History:  Diagnosis Date  . AAA (abdominal aortic aneurysm) ([place])    ultrasound 07/2011:  2.7 x 2.8 cm  . Acute myocardial infarction, unspecified site, episode of care unspecified 1998  . Aortic stenosis    echo 4/12: normal LVF, mild LAE, no significant AS  . CAD (coronary artery disease)    s/p PCI of LAD 1998;  s/p DES to RCA 05/2009;  Myoview 4/13: low risk, no ischemia, EF 56%  . COPD (chronic obstructive pulmonary disease) ([place])   . DJD (degenerative joint disease), lumbar 07/06/2011  . Esophageal  reflux   . HLD (hyperlipidemia)   . HTN (hypertension)   . PAD (peripheral artery disease) ([place])    s/p L CIA stent 09/2011 ([place])  . Renal artery stenosis ([facility])    s/p bilat RA stents  . Urine incontinence     Past Surgical History:  Procedure Laterality Date  . CATARACT EXTRACTION W/ INTRAOCULAR LENS  IMPLANT, BILATERAL  YRS AGO  . CORONARY ANGIOPLASTY WITH STENT PLACEMENT    . [place]  . LOWER EXTREMITY ANGIOGRAM Bilateral 09/20/2011   Procedure: LOWER EXTREMITY ANGIOGRAM;  Surgeon: Wellington Hampshire, MD;  Location: [facility] CATH LAB;  Service: Cardiovascular;  Laterality: Bilateral;  . PCI of LAD  05/1996   STENT X 4 TOTAL  . TRANSURETHRAL RESECTION OF PROSTATE  08/11/2011   Procedure: TRANSURETHRAL RESECTION OF THE PROSTATE WITH GYRUS INSTRUMENTS;  Surgeon: Fredricka Bonine, MD;  [facility];  Service: Urology;  Laterality: N/A;     . VASECTOMY  1968    Allergies  Allergen Reactions  . Influenza Vaccines Other (See Comments)    unknown  . Sulfa Antibiotics Rash    Family History  Problem Relation Age of Onset  . Heart disease Mother   . Heart disease Father      Social History   Social History  . Marital status: Widowed    Spouse name: N/A  . Number of children: N/A  . Years of education: GED  Occupational History  . retired    Social History Main Topics  . Smoking status: Former Smoker    Packs/day: 3.00    Years: 38.00    Quit date: 04/10/1988  . Smokeless tobacco: Never Used  . Alcohol use No     Comment: rarely  . Drug use: No  . Sexual activity: Yes   Other Topics Concern  . Not on file   Social History Narrative  . No narrative on file     PHYSICAL EXAM:  VS: BP 136/82 (BP Location: Left Arm, Patient Position: Sitting, Cuff Size: Normal)   Pulse 78   Temp (!) 94.5 F (34.7 C) (Axillary)   Ht _0  (1.753 m)   Wt 145 lb (65.8 kg)   SpO2 98%   BMI 21.41 kg/m  Physical Exam Gen: NAD, alert, cooperative with exam,  well-appearing ENT: normal lips, normal nasal mucosa,  Eye: normal EOM, normal conjunctiva and lids CV:  no edema, +2 pedal pulses   Resp: no accessory muscle use, non-labored,  GI: no masses or tenderness, no hernia  Skin: no rashes, no areas of induration  Neuro: normal tone, normal sensation to touch Psych:  normal insight, alert and oriented MSK:  Left ankle Obvious redness and swelling of the DIP of the third digit. Flexion and extension in place. Normal sensation. No fluctuance palpated. Fingernail intact. Swelling and redness occurring mainly on the dorsal aspect of the DIP. Neurovascular intact.    Limited ultrasound: Left hand:  He has significant arthritis within the DIP joint of the third digit but no abscess was appreciated  Summary: Significant arthritis of the third digit DIP joint  Ultrasound and interpretation by Clearance Coots, MD           ASSESSMENT & PLAN:   I spent 25 minutes with this patient, greater than 50% was face-to-face time counseling regarding the below diagnosis.   Paronychia of left middle finger Symptoms have returned after ABX use. May be herpetic whitlow  - acyclovir  - CBC w/ diff  - If no improvement then may consider doxy   Finger swelling Possible to have an autoimmune problem  - RF, Anti-CCP, ESR, CRP

## 2016-12-18 NOTE — Assessment & Plan Note (Addendum)
Symptoms have returned after ABX use. May be herpetic whitlow  - acyclovir  - CBC w/ diff  - If no improvement then may consider doxy

## 2016-12-20 LAB — CYCLIC CITRUL PEPTIDE ANTIBODY, IGG: Cyclic Citrullin Peptide Ab: <16 UNITS

## 2016-12-20 LAB — RHEUMATOID FACTOR: Rhuematoid fact SerPl-aCnc: <14 IU/mL (ref <14)

## 2016-12-20 NOTE — Telephone Encounter (Signed)
error 

## 2016-12-21 ENCOUNTER — Telehealth: Payer: Self-pay | Admitting: Internal Medicine

## 2016-12-21 MED ORDER — DICLOFENAC SODIUM 1 % TD GEL: 2.0000 g | Freq: Four times a day (QID) | TRANSDERMAL | 1 refills | Status: DC | PRN

## 2016-12-21 NOTE — Telephone Encounter (Signed)
Notified pt w/MD response. Had to resend script to correct pharmacy. Pt does not use [facility] in [place]. Removed off med list as well...Kristopher [name]

## 2016-12-21 NOTE — Telephone Encounter (Signed)
Pt saw Dr. Raeford Razor he is not in the office today. Pls advise on msg below.Marland KitchenJohny Chess

## 2016-12-21 NOTE — Telephone Encounter (Signed)
Pt called in and said that his finger is not doing much better.  He would like to know if there is nothing else that can be called in to help before the weekend?    Best number [phone]

## 2016-12-21 NOTE — Telephone Encounter (Signed)
Ok for voltaren gel - I have sent erx

## 2016-12-26 ENCOUNTER — Encounter: Payer: Self-pay | Admitting: Registered Nurse

## 2016-12-26 ENCOUNTER — Encounter: Payer: Medicare Other | Attending: Registered Nurse | Admitting: Registered Nurse

## 2016-12-26 VITALS — BP 118/73 | HR 68

## 2016-12-26 DIAGNOSIS — M1611 Unilateral primary osteoarthritis, right hip: Secondary | ICD-10-CM | POA: Diagnosis not present

## 2016-12-26 DIAGNOSIS — G894 Chronic pain syndrome: Secondary | ICD-10-CM

## 2016-12-26 DIAGNOSIS — Z79899 Other long term (current) drug therapy: Secondary | ICD-10-CM | POA: Diagnosis not present

## 2016-12-26 DIAGNOSIS — M1711 Unilateral primary osteoarthritis, right knee: Secondary | ICD-10-CM | POA: Diagnosis not present

## 2016-12-26 DIAGNOSIS — M48062 Spinal stenosis, lumbar region with neurogenic claudication: Secondary | ICD-10-CM

## 2016-12-26 DIAGNOSIS — I701 Atherosclerosis of renal artery: Secondary | ICD-10-CM

## 2016-12-26 DIAGNOSIS — M25561 Pain in right knee: Secondary | ICD-10-CM

## 2016-12-26 DIAGNOSIS — Z5181 Encounter for therapeutic drug level monitoring: Secondary | ICD-10-CM | POA: Insufficient documentation

## 2016-12-26 MED ORDER — DICLOFENAC SODIUM 1 % TD GEL: 2.0000 g | Freq: Four times a day (QID) | TRANSDERMAL | 2 refills | Status: DC | PRN

## 2016-12-26 MED ORDER — HYDROCODONE-ACETAMINOPHEN 5-325 MG PO TABS: 1.0000 | ORAL_TABLET | Freq: Three times a day (TID) | ORAL | 0 refills | Status: DC

## 2016-12-26 NOTE — Progress Notes (Signed)
Subjective:    Patient ID: [name], male    DOB: 02-24-1931, 83 y.o.   MRN: [id number]  HPI: [name] is a 83year old male who returns for follow up appointmentfor chronic pain and medication refill. He states his pain is located in his left finger ( 3rd digit) ,right leg and  right knee. Heratehis pain 4. His current exercise regime is walking and performing stretching exercises.  Mr. [name] last UDS was on 09/19/2016, it was consistent.  Pain Inventory Average Pain 3 Pain Right Now 4 My pain is intermittent, burning and aching  In the last 24 hours, has pain interfered with the following? General activity 3 Relation with others 3 Enjoyment of life 3 What TIME of day is your pain at its worst? evening Sleep (in general) Good  Pain is worse with: walking, standing and some activites Pain improves with: rest and medication Relief from Meds: 9  Mobility walk with assistance use a cane ability to climb steps?  yes do you drive?  yes  Function retired  Neuro/Psych numbness tingling spasms  Prior Studies Any changes since last visit?  no  Physicians involved in your care Any changes since last visit?  no   Family History  Problem Relation Age of Onset  . Heart disease Mother   . Heart disease Father    Social History   Social History  . Marital status: Widowed    Spouse name: N/A  . Number of children: N/A  . Years of education: GED   Occupational History  . retired    Social History Main Topics  . Smoking status: Former Smoker    Packs/day: 3.00    Years: 38.00    Quit date: 04/10/1988  . Smokeless tobacco: Never Used  . Alcohol use No     Comment: rarely  . Drug use: No  . Sexual activity: Yes   Other Topics Concern  . None   Social History Narrative  . None   Past Surgical History:  Procedure Laterality Date  . CATARACT EXTRACTION W/ INTRAOCULAR LENS  IMPLANT, BILATERAL  YRS AGO  . CORONARY ANGIOPLASTY WITH STENT  PLACEMENT    . [place]  . LOWER EXTREMITY ANGIOGRAM Bilateral 09/20/2011   Procedure: LOWER EXTREMITY ANGIOGRAM;  Surgeon: Wellington Hampshire, MD;  Location: [facility] CATH [facility];  Service: Cardiovascular;  Laterality: Bilateral;  . PCI of LAD  05/1996   STENT X 4 TOTAL  . TRANSURETHRAL RESECTION OF PROSTATE  08/11/2011   Procedure: TRANSURETHRAL RESECTION OF THE PROSTATE WITH GYRUS INSTRUMENTS;  Surgeon: Fredricka Bonine, MD;  Location: [facility];  Service: Urology;  Laterality: N/A;     . VASECTOMY  1968   Past Medical History:  Diagnosis Date  . AAA (abdominal aortic aneurysm) ([place])    ultrasound 07/2011:  2.7 x 2.8 cm  . Acute myocardial infarction, unspecified site, episode of care unspecified 1998  . Aortic stenosis    echo 4/12: normal LVF, mild LAE, no significant AS  . CAD (coronary artery disease)    s/p PCI of LAD 1998;  s/p DES to RCA 05/2009;  Myoview 4/13: low risk, no ischemia, EF 56%  . COPD (chronic obstructive pulmonary disease) ([place])   . DJD (degenerative joint disease), lumbar 07/06/2011  . Esophageal reflux   . HLD (hyperlipidemia)   . HTN (hypertension)   . PAD (peripheral artery disease) ([place])    s/p L CIA stent 09/2011 ([place])  .  Renal artery stenosis ([facility])    s/p bilat RA stents  . Urine incontinence    BP 118/73   Pulse 68   SpO2 94%   Opioid Risk Score:  0 Fall Risk Score:  `1  Depression screen PHQ 2/9  Depression screen [facility] 2/9 12/26/2016 08/22/2016 05/05/2016 01/26/2016 07/27/2015 06/07/2015 01/22/2015  Decreased Interest 0 0 0 0 0 0 0  Down, Depressed, Hopeless 0 0 0 0 0 0 0  PHQ - 2 Score 0 0 0 0 0 0 0  Altered sleeping - - - - - - -  Tired, decreased energy - - - - - - -  Change in appetite - - - - - - -  Feeling bad or failure about yourself  - - - - - - -  Trouble concentrating - - - - - - -  Moving slowly or fidgety/restless - - - - - - -  Suicidal thoughts - - - - - - -  PHQ-9 Score - - - - - - -     Review of Systems    Constitutional: Negative.   HENT: Negative.   Eyes: Negative.   Respiratory: Negative.   Cardiovascular: Negative.   Gastrointestinal: Negative.   Endocrine: Negative.   Genitourinary: Negative.   Musculoskeletal: Positive for gait problem and joint swelling.  Skin: Negative.   Allergic/Immunologic: Negative.   Hematological: Negative.   Psychiatric/Behavioral: Negative.   All other systems reviewed and are negative.      Objective:   Physical Exam  Constitutional: He is oriented to person, place, and time. He appears well-developed and well-nourished.  HENT:  Head: Normocephalic and atraumatic.  Neck: Normal range of motion. Neck supple.  Cardiovascular: Normal rate and regular rhythm.   Pulmonary/Chest: Effort normal and breath sounds normal.  Musculoskeletal:  Normal Muscle Bulk and Muscle Testing Reveals: Upper Extremities: Full ROM and Muscle Strength 5/5 Back without spinal tenderness Lower Extremities: Full ROM and Muscle Strength 5/5 Right Lower Extremity Flexion Produces Pain into Right Lower Extremity and Patella Arises from Table slowly using straight cane for support  Neurological: He is alert and oriented to person, place, and time.  Skin: Skin is warm and dry. No erythema.  Left Finger (Third Digit) with  redness noted  Psychiatric: He has a normal mood and affect.  Nursing note and vitals reviewed.         Assessment & Plan:  1. Patellofemoral arthritis. Continue Voltaren gel, and continue with exercise regimen. 12/26/2016 2 Chronic Low Back Pain/ Lumbar Stenosis:Continue HEP as Tolerated and Continue to Monitor  12/26/2016 3. Chronic Pain: Refilled: Hydrocodone 5/325 mg one tablet three times a day #90. 12/26/2016 We will continue the opioid monitoring program, this consists of regular clinic visits, examinations, urine drug screen, pill counts as well as use of [place] Controlled Substance reporting System. 4. Muscle Spasm: Continue Flexeril    20 minutes of face to face patient care time was spent during this visit. All questions were encouraged and answered.  F/U in 1 month

## 2017-01-25 ENCOUNTER — Encounter: Payer: Self-pay | Admitting: Physical Medicine & Rehabilitation

## 2017-01-25 ENCOUNTER — Ambulatory Visit (HOSPITAL_BASED_OUTPATIENT_CLINIC_OR_DEPARTMENT_OTHER): Payer: Medicare Other | Admitting: Physical Medicine & Rehabilitation

## 2017-01-25 ENCOUNTER — Encounter: Payer: Medicare Other | Attending: Registered Nurse

## 2017-01-25 VITALS — BP 106/76 | HR 70 | Resp 14

## 2017-01-25 DIAGNOSIS — Z79899 Other long term (current) drug therapy: Secondary | ICD-10-CM | POA: Insufficient documentation

## 2017-01-25 DIAGNOSIS — I701 Atherosclerosis of renal artery: Secondary | ICD-10-CM

## 2017-01-25 DIAGNOSIS — M1611 Unilateral primary osteoarthritis, right hip: Secondary | ICD-10-CM

## 2017-01-25 DIAGNOSIS — M48062 Spinal stenosis, lumbar region with neurogenic claudication: Secondary | ICD-10-CM | POA: Diagnosis not present

## 2017-01-25 DIAGNOSIS — M1711 Unilateral primary osteoarthritis, right knee: Secondary | ICD-10-CM | POA: Diagnosis not present

## 2017-01-25 DIAGNOSIS — G894 Chronic pain syndrome: Secondary | ICD-10-CM | POA: Insufficient documentation

## 2017-01-25 DIAGNOSIS — M25561 Pain in right knee: Secondary | ICD-10-CM | POA: Diagnosis not present

## 2017-01-25 DIAGNOSIS — Z5181 Encounter for therapeutic drug level monitoring: Secondary | ICD-10-CM | POA: Diagnosis not present

## 2017-01-25 MED ORDER — HYDROCODONE-ACETAMINOPHEN 5-325 MG PO TABS: 1.0000 | ORAL_TABLET | Freq: Three times a day (TID) | ORAL | 0 refills | Status: DC

## 2017-01-25 NOTE — Progress Notes (Signed)
Subjective:    Patient ID: Kristopher [name], male    DOB: [birth year], 83 y.o.   MRN: [id number]  HPI  83 year old male with lumbar spinal stenosis, intermittent radicular pain L3 distribution as well as right hip osteoarthritis and right knee osteoarthritis. No recent hip or knee injections. Right post thigh "thumping' comes and goes Intermittent knee and  hip joint pain No constipation No falls Meds effective for the most part, at times, he wishes to take an extra pill Pain Inventory Average Pain 3 Pain Right Now 3 My pain is constant, dull and tightness  In the last 24 hours, has pain interfered with the following? General activity 0 Relation with others 0 Enjoyment of life 0 What TIME of day is your pain at its worst? varies Sleep (in general) Good  Pain is worse with: some activites Pain improves with: medication Relief from Meds: 8  Mobility walk without assistance Do you have any goals in this area?  no  Function retired  Neuro/Psych No problems in this area  Prior Studies Any changes since last visit?  no  Physicians involved in your care Any changes since last visit?  no   Family History  Problem Relation Age of Onset  . Heart disease Mother   . Heart disease Father    Social History   Social History  . Marital status: Widowed    Spouse name: N/A  . Number of children: N/A  . Years of education: GED   Occupational History  . retired    Social History Main Topics  . Smoking status: Former Smoker    Packs/day: 3.00    Years: 38.00    Quit date: 04/10/1988  . Smokeless tobacco: Never Used  . Alcohol use No     Comment: rarely  . Drug use: No  . Sexual activity: Yes   Other Topics Concern  . None   Social History Narrative  . None   Past Surgical History:  Procedure Laterality Date  . CATARACT EXTRACTION W/ INTRAOCULAR LENS  IMPLANT, BILATERAL  YRS AGO  . CORONARY ANGIOPLASTY WITH STENT PLACEMENT    . [place]  .  LOWER EXTREMITY ANGIOGRAM Bilateral 09/20/2011   Procedure: LOWER EXTREMITY ANGIOGRAM;  Surgeon: Wellington Hampshire, MD;  Location: [facility] CATH LAB;  Service: Cardiovascular;  Laterality: Bilateral;  . PCI of LAD  05/1996   STENT X 4 TOTAL  . TRANSURETHRAL RESECTION OF PROSTATE  08/11/2011   Procedure: TRANSURETHRAL RESECTION OF THE PROSTATE WITH GYRUS INSTRUMENTS;  Surgeon: Fredricka Bonine, MD;  Location: [facility];  Service: Urology;  Laterality: N/A;     . VASECTOMY  1968   Past Medical History:  Diagnosis Date  . AAA (abdominal aortic aneurysm) ([place])    ultrasound 07/2011:  2.7 x 2.8 cm  . Acute myocardial infarction, unspecified site, episode of care unspecified 1998  . Aortic stenosis    echo 4/12: normal LVF, mild LAE, no significant AS  . CAD (coronary artery disease)    s/p PCI of LAD 1998;  s/p DES to RCA 05/2009;  Myoview 4/13: low risk, no ischemia, EF 56%  . COPD (chronic obstructive pulmonary disease) ([place])   . DJD (degenerative joint disease), lumbar 07/06/2011  . Esophageal reflux   . HLD (hyperlipidemia)   . HTN (hypertension)   . PAD (peripheral artery disease) ([place])    s/p L CIA stent 09/2011 ([place])  . Renal artery stenosis ([facility])    s/p bilat RA  stents  . Urine incontinence    BP 106/76 (BP Location: Right Arm, Patient Position: Sitting, Cuff Size: Normal)   Pulse 70   Resp 14   SpO2 95%   Opioid Risk Score:   Fall Risk Score:  `1  Depression screen PHQ 2/9  Depression screen [facility] 2/9 12/26/2016 08/22/2016 05/05/2016 01/26/2016 07/27/2015 06/07/2015 01/22/2015  Decreased Interest 0 0 0 0 0 0 0  Down, Depressed, Hopeless 0 0 0 0 0 0 0  PHQ - 2 Score 0 0 0 0 0 0 0  Altered sleeping - - - - - - -  Tired, decreased energy - - - - - - -  Change in appetite - - - - - - -  Feeling bad or failure about yourself  - - - - - - -  Trouble concentrating - - - - - - -  Moving slowly or fidgety/restless - - - - - - -  Suicidal thoughts - - - - - - -  PHQ-9 Score - - - - - - -      Review of Systems  Constitutional: Negative.   HENT: Negative.   Eyes: Negative.   Respiratory: Negative.   Cardiovascular: Negative.   Gastrointestinal: Negative.   Endocrine: Negative.   Genitourinary: Negative.   Musculoskeletal: Negative.   Skin: Negative.   Allergic/Immunologic: Negative.   Neurological: Negative.   Hematological: Negative.   Psychiatric/Behavioral: Negative.   All other systems reviewed and are negative.      Objective:   Physical Exam  Constitutional: He is oriented to person, place, and time. He appears well-developed and well-nourished.  HENT:  Head: Normocephalic and atraumatic.  Eyes: Pupils are equal, round, and reactive to light. Conjunctivae and EOM are normal.  Neck: Normal range of motion.  Neurological: He is alert and oriented to person, place, and time.  Psychiatric: He has a normal mood and affect.  Nursing note and vitals reviewed.  Patient has normal lumbar flexion, reduced extension. No tenderness to palpation along the lumbar or lumbar sacral spine. Has reduced hip internal and stroke rotation Knee flexion, extension is full. No evidence of knee effusion. Ambulates with a cane. No evidence of toe drag or knee instability       Assessment & Plan:  1. Lumbar spinal stenosis with history of intermittent radiculopathy. This is mainly activity related. However, it is not disabling. No indication of progression.  2. Right hip osteoarthritis has benefited from intra-articular injection under ultrasound guidance in the past. However, at this point this is not  severely limiting condition for him. He is still cutting his grass with a push mower  3. Right knee osteoarthritis. No evidence of effusion. Pain is controlled by current regimen He may take up to 3 extra Tylenol per day as needed   Continue opioid monitoring program. This consists of regular clinic visits, examinations, urine drug screen 09/2016, pill counts as well as use  of [place] controlled substance reporting System.

## 2017-01-25 NOTE — Patient Instructions (Signed)
May take an additional 3. Tylenol per day as needed for pain

## 2017-01-31 ENCOUNTER — Other Ambulatory Visit: Payer: Self-pay | Admitting: *Deleted

## 2017-01-31 DIAGNOSIS — I714 Abdominal aortic aneurysm, without rupture, unspecified: Secondary | ICD-10-CM

## 2017-02-26 ENCOUNTER — Other Ambulatory Visit: Payer: Self-pay

## 2017-02-26 ENCOUNTER — Encounter: Payer: Self-pay | Admitting: Registered Nurse

## 2017-02-26 ENCOUNTER — Telehealth: Payer: Self-pay | Admitting: Physical Medicine & Rehabilitation

## 2017-02-26 ENCOUNTER — Encounter: Payer: Medicare Other | Attending: Registered Nurse | Admitting: Registered Nurse

## 2017-02-26 VITALS — BP 148/82 | HR 61

## 2017-02-26 DIAGNOSIS — M48062 Spinal stenosis, lumbar region with neurogenic claudication: Secondary | ICD-10-CM

## 2017-02-26 DIAGNOSIS — Z79899 Other long term (current) drug therapy: Secondary | ICD-10-CM | POA: Diagnosis not present

## 2017-02-26 DIAGNOSIS — M1711 Unilateral primary osteoarthritis, right knee: Secondary | ICD-10-CM | POA: Diagnosis not present

## 2017-02-26 DIAGNOSIS — G609 Hereditary and idiopathic neuropathy, unspecified: Secondary | ICD-10-CM | POA: Diagnosis not present

## 2017-02-26 DIAGNOSIS — G894 Chronic pain syndrome: Secondary | ICD-10-CM

## 2017-02-26 DIAGNOSIS — M1611 Unilateral primary osteoarthritis, right hip: Secondary | ICD-10-CM | POA: Diagnosis not present

## 2017-02-26 DIAGNOSIS — M25561 Pain in right knee: Secondary | ICD-10-CM | POA: Diagnosis not present

## 2017-02-26 DIAGNOSIS — Z5181 Encounter for therapeutic drug level monitoring: Secondary | ICD-10-CM | POA: Insufficient documentation

## 2017-02-26 DIAGNOSIS — I701 Atherosclerosis of renal artery: Secondary | ICD-10-CM | POA: Diagnosis not present

## 2017-02-26 MED ORDER — HYDROCODONE-ACETAMINOPHEN 5-325 MG PO TABS: 1.0000 | ORAL_TABLET | Freq: Three times a day (TID) | ORAL | 0 refills | Status: DC

## 2017-02-26 NOTE — Telephone Encounter (Signed)
Pt phoned and stated that the pharmacy told him it was okay as long as it wasn't taken with Nitro. Please advise.

## 2017-02-26 NOTE — Progress Notes (Signed)
Subjective:    Patient ID: Kristopher [name], male    DOB: March 19, 1931, 83 y.o.   MRN: [id number]  HPI: [name] is a 83year old male who returns for follow up appointmentfor chronic pain and medication refill. He states his pain is located in his right leg and  right knee. Heratehis pain 2. His current exercise regime is walking and performing stretching exercises.  [name] states he bought Nitric Oxide/ Sanquenal and has been taking this, he didn't ask his PCP prior to purchasing. He was instructed to call his PCP and speak to his pharmacist.Also educated on speaking to his providers before purchasing medication on-line he verbalizes understanding.   [name] called office and stated he spoke with his pharmacist regarding the Nitric Oxide.   [name] last UDS was on 09/19/2016, it was consistent.  Pain Inventory Average Pain 3 Pain Right Now 2 My pain is dull and .  In the last 24 hours, has pain interfered with the following? General activity 3 Relation with others 3 Enjoyment of life 3 What TIME of day is your pain at its worst? evening Sleep (in general) Good  Pain is worse with: walking, bending, standing and some activites Pain improves with: rest and medication Relief from Meds: 9  Mobility walk with assistance use a cane ability to climb steps?  yes do you drive?  yes  Function retired  Neuro/Psych numbness tingling spasms  Prior Studies Any changes since last visit?  no  Physicians involved in your care Any changes since last visit?  no   Family History  Problem Relation Age of Onset  . Heart disease Mother   . Heart disease Father    Social History   Socioeconomic History  . Marital status: Widowed    Spouse name: None  . Number of children: None  . Years of education: GED  . Highest education level: None  Social Needs  . Financial resource strain: None  . Food insecurity - worry: None  . Food insecurity - inability: None    . Transportation needs - medical: None  . Transportation needs - non-medical: None  Occupational History  . Occupation: retired  Tobacco Use  . Smoking status: Former Smoker    Packs/day: 3.00    Years: 38.00    Pack years: 114.00    Last attempt to quit: 04/10/1988    Years since quitting: 28.9  . Smokeless tobacco: Never Used  Substance and Sexual Activity  . Alcohol use: No    Comment: rarely  . Drug use: No  . Sexual activity: Yes  Other Topics Concern  . None  Social History Narrative  . None   Past Surgical History:  Procedure Laterality Date  . CATARACT EXTRACTION W/ INTRAOCULAR LENS  IMPLANT, BILATERAL  YRS AGO  . CORONARY ANGIOPLASTY WITH STENT PLACEMENT    . [place]  . LOWER EXTREMITY ANGIOGRAM Bilateral 09/20/2011   Performed by Wellington Hampshire, MD at [facility] CATH LAB  . PCI of LAD  05/1996   STENT X 4 TOTAL  . TRANSURETHRAL RESECTION OF THE PROSTATE WITH GYRUS INSTRUMENTS N/A 08/11/2011   Performed by Fredricka Bonine, MD at [facility] ORS  . VASECTOMY  1968   Past Medical History:  Diagnosis Date  . AAA (abdominal aortic aneurysm) ([place])    ultrasound 07/2011:  2.7 x 2.8 cm  . Acute myocardial infarction, unspecified site, episode of care unspecified 1998  . Aortic stenosis  echo 4/12: normal LVF, mild LAE, no significant AS  . CAD (coronary artery disease)    s/p PCI of LAD 1998;  s/p DES to RCA 05/2009;  Myoview 4/13: low risk, no ischemia, EF 56%  . COPD (chronic obstructive pulmonary disease) ([place])   . DJD (degenerative joint disease), lumbar 07/06/2011  . Esophageal reflux   . HLD (hyperlipidemia)   . HTN (hypertension)   . PAD (peripheral artery disease) ([place])    s/p L CIA stent 09/2011 ([place])  . Renal artery stenosis ([facility])    s/p bilat RA stents  . Urine incontinence    There were no vitals taken for this visit.  Opioid Risk Score:  0 Fall Risk Score:  `1  Depression screen PHQ 2/9  Depression screen [facility] 2/9 02/26/2017  12/26/2016 08/22/2016 05/05/2016 01/26/2016 07/27/2015 06/07/2015  Decreased Interest 0 0 0 0 0 0 0  Down, Depressed, Hopeless 0 0 0 0 0 0 0  PHQ - 2 Score 0 0 0 0 0 0 0  Altered sleeping - - - - - - -  Tired, decreased energy - - - - - - -  Change in appetite - - - - - - -  Feeling bad or failure about yourself  - - - - - - -  Trouble concentrating - - - - - - -  Moving slowly or fidgety/restless - - - - - - -  Suicidal thoughts - - - - - - -  PHQ-9 Score - - - - - - -     Review of Systems  Constitutional: Negative.   HENT: Negative.   Eyes: Negative.   Respiratory: Negative.   Cardiovascular: Negative.   Gastrointestinal: Negative.   Endocrine: Negative.   Genitourinary: Negative.   Musculoskeletal: Positive for gait problem and joint swelling.  Skin: Negative.   Allergic/Immunologic: Negative.   Hematological: Negative.   Psychiatric/Behavioral: Negative.   All other systems reviewed and are negative.      Objective:   Physical Exam  Constitutional: He is oriented to person, place, and time. He appears well-developed and well-nourished.  HENT:  Head: Normocephalic and atraumatic.  Neck: Normal range of motion. Neck supple.  Cardiovascular: Normal rate and regular rhythm.  Pulmonary/Chest: Effort normal and breath sounds normal.  Musculoskeletal:  Normal Muscle Bulk and Muscle Testing Reveals: Upper Extremities: Full ROM and Muscle Strength 5/5 Back without spinal tenderness Lower Extremities: Full ROM and Muscle Strength 5/5 Right Lower Extremity Flexion Produces Pain into Right Lower Extremity and Patella Arises from Table slowly using straight cane for support  Neurological: He is alert and oriented to person, place, and time.  Skin: Skin is warm and dry. No erythema.  Left Finger (Third Digit) with  redness noted  Psychiatric: He has a normal mood and affect.  Nursing note and vitals reviewed.         Assessment & Plan:  1. Patellofemoral arthritis.  Continue Voltaren gel, and continue with exercise regimen. 02/26/2017 2 Chronic Low Back Pain/ Lumbar Stenosis:Continue HEP as Tolerated and Continue to Monitor  02/26/2017 3. Chronic Pain: Refilled: Hydrocodone 5/325 mg one tablet three times a day #90. 02/26/2017 We will continue the opioid monitoring program, this consists of regular clinic visits, examinations, urine drug screen, pill counts as well as use of [place] Controlled Substance reporting System. 4. Muscle Spasm: Continue Flexeril . 02/26/2017  20 minutes of face to face patient care time was spent during this visit. All questions were encouraged  and answered.  F/U in 1 month

## 2017-02-27 ENCOUNTER — Ambulatory Visit: Payer: Medicare Other | Admitting: Registered Nurse

## 2017-02-28 ENCOUNTER — Telehealth: Payer: Self-pay | Admitting: Registered Nurse

## 2017-02-28 NOTE — Telephone Encounter (Signed)
On 02/28/2017 the Kristopher [name] was reviewed no conflict was seen on the Kristopher [name] with multiple prescribers. Kristopher [name]  has a signed narcotic contract with our office. If there were any discrepancies this would have been reported to his/her physician.   Kristopher [name] Morphine equivalent is 15.00 MME.

## 2017-03-22 ENCOUNTER — Other Ambulatory Visit: Payer: Self-pay | Admitting: *Deleted

## 2017-03-22 MED ORDER — CYCLOBENZAPRINE HCL 5 MG PO TABS: 5.0000 mg | ORAL_TABLET | Freq: Every evening | ORAL | 2 refills | Status: DC | PRN

## 2017-03-29 ENCOUNTER — Other Ambulatory Visit: Payer: Self-pay

## 2017-03-29 ENCOUNTER — Encounter: Payer: Self-pay | Admitting: Registered Nurse

## 2017-03-29 ENCOUNTER — Encounter: Payer: Medicare Other | Attending: Registered Nurse | Admitting: Registered Nurse

## 2017-03-29 VITALS — BP 149/89 | HR 72

## 2017-03-29 DIAGNOSIS — M1711 Unilateral primary osteoarthritis, right knee: Secondary | ICD-10-CM | POA: Insufficient documentation

## 2017-03-29 DIAGNOSIS — Z79899 Other long term (current) drug therapy: Secondary | ICD-10-CM | POA: Diagnosis not present

## 2017-03-29 DIAGNOSIS — M48062 Spinal stenosis, lumbar region with neurogenic claudication: Secondary | ICD-10-CM

## 2017-03-29 DIAGNOSIS — M25561 Pain in right knee: Secondary | ICD-10-CM

## 2017-03-29 DIAGNOSIS — G894 Chronic pain syndrome: Secondary | ICD-10-CM

## 2017-03-29 DIAGNOSIS — M1611 Unilateral primary osteoarthritis, right hip: Secondary | ICD-10-CM | POA: Insufficient documentation

## 2017-03-29 DIAGNOSIS — I701 Atherosclerosis of renal artery: Secondary | ICD-10-CM | POA: Diagnosis not present

## 2017-03-29 DIAGNOSIS — Z5181 Encounter for therapeutic drug level monitoring: Secondary | ICD-10-CM | POA: Diagnosis not present

## 2017-03-29 DIAGNOSIS — G609 Hereditary and idiopathic neuropathy, unspecified: Secondary | ICD-10-CM

## 2017-03-29 MED ORDER — HYDROCODONE-ACETAMINOPHEN 5-325 MG PO TABS: 1.0000 | ORAL_TABLET | Freq: Three times a day (TID) | ORAL | 0 refills | Status: DC

## 2017-03-29 NOTE — Progress Notes (Signed)
Subjective:    Patient ID: Kristopher [name], male    DOB: 1930-07-26, 83 y.o.   MRN: [id number]  HPI: Kristopher [name] is an 83year old male who returns for follow up appointmentfor chronic pain and medication refill. He states his pain is located in his right leg and  right knee. Heratehis pain 2. His current exercise regime is walking and performing stretching exercises.  Kristopher [name] Morphine equivalent is 15.00 MME.    Kristopher [name] last UDS was on 09/19/2016, it was consistent.  Pain Inventory Average Pain 3 Pain Right Now 2 My pain is dull and stabbing  In the last 24 hours, has pain interfered with the following? General activity 3 Relation with others 3 Enjoyment of life 3 What TIME of day is your pain at its worst? daytime Sleep (in general) Good  Pain is worse with: walking and some activites Pain improves with: rest and medication Relief from Meds: 8  Mobility use a cane  Function retired  Neuro/Psych trouble walking  Prior Studies Any changes since last visit?  no  Physicians involved in your care Any changes since last visit?  no   Family History  Problem Relation Age of Onset  . Heart disease Mother   . Heart disease Father    Social History   Socioeconomic History  . Marital status: Widowed    Spouse name: None  . Number of children: None  . Years of education: GED  . Highest education level: None  Social Needs  . Financial resource strain: None  . Food insecurity - worry: None  . Food insecurity - inability: None  . Transportation needs - medical: None  . Transportation needs - non-medical: None  Occupational History  . Occupation: retired  Tobacco Use  . Smoking status: Former Smoker    Packs/day: 3.00    Years: 38.00    Pack years: 114.00    Last attempt to quit: 04/10/1988    Years since quitting: 28.9  . Smokeless tobacco: Never Used  Substance and Sexual Activity  . Alcohol use: No    Comment: rarely  . Drug use: No  .  Sexual activity: Yes  Other Topics Concern  . None  Social History Narrative  . None   Past Surgical History:  Procedure Laterality Date  . CATARACT EXTRACTION W/ INTRAOCULAR LENS  IMPLANT, BILATERAL  YRS AGO  . CORONARY ANGIOPLASTY WITH STENT PLACEMENT    . [place]  . LOWER EXTREMITY ANGIOGRAM Bilateral 09/20/2011   Procedure: LOWER EXTREMITY ANGIOGRAM;  Surgeon: Kristopher Hampshire, MD;  Location: [facility];  Service: Cardiovascular;  Laterality: Bilateral;  . PCI of LAD  05/1996   STENT X 4 TOTAL  . TRANSURETHRAL RESECTION OF PROSTATE  08/11/2011   Procedure: TRANSURETHRAL RESECTION OF THE PROSTATE WITH GYRUS INSTRUMENTS;  Surgeon: Fredricka Bonine, MD;  Location: [facility];  Service: Urology;  Laterality: N/A;     . VASECTOMY  1968   Past Medical History:  Diagnosis Date  . AAA (abdominal aortic aneurysm) ([place])    ultrasound 07/2011:  2.7 x 2.8 cm  . Acute myocardial infarction, unspecified site, episode of care unspecified 1998  . Aortic stenosis    echo 4/12: normal LVF, mild LAE, no significant AS  . CAD (coronary artery disease)    s/p PCI of LAD 1998;  s/p DES to RCA 05/2009;  Myoview 4/13: low risk, no ischemia, EF 56%  . COPD (chronic obstructive pulmonary  disease) ([place])   . DJD (degenerative joint disease), lumbar 07/06/2011  . Esophageal reflux   . HLD (hyperlipidemia)   . HTN (hypertension)   . PAD (peripheral artery disease) ([place])    s/p L CIA stent 09/2011 ([place])  . Renal artery stenosis ([facility])    s/p bilat RA stents  . Urine incontinence    BP (!) 154/80   Pulse 69   SpO2 95%   Opioid Risk Score:  0 Fall Risk Score:  `1  Depression screen PHQ 2/9  Depression screen [facility] 2/9 03/29/2017 02/26/2017 12/26/2016 08/22/2016 05/05/2016 01/26/2016 07/27/2015  Decreased Interest 0 0 0 0 0 0 0  Down, Depressed, Hopeless 0 0 0 0 0 0 0  PHQ - 2 Score 0 0 0 0 0 0 0  Altered sleeping - - - - - - -  Tired, decreased energy - - - - - - -  Change in  appetite - - - - - - -  Feeling bad or failure about yourself  - - - - - - -  Trouble concentrating - - - - - - -  Moving slowly or fidgety/restless - - - - - - -  Suicidal thoughts - - - - - - -  PHQ-9 Score - - - - - - -     Review of Systems  Constitutional: Negative.   HENT: Negative.   Eyes: Negative.   Respiratory: Negative.   Cardiovascular: Negative.   Gastrointestinal: Negative.   Endocrine: Negative.   Genitourinary: Negative.   Musculoskeletal: Positive for gait problem and joint swelling.  Skin: Negative.   Allergic/Immunologic: Negative.   Hematological: Negative.   Psychiatric/Behavioral: Negative.   All other systems reviewed and are negative.      Objective:   Physical Exam  Constitutional: He is oriented to person, place, and time. He appears well-developed and well-nourished.  HENT:  Head: Normocephalic and atraumatic.  Neck: Normal range of motion. Neck supple.  Cardiovascular: Normal rate and regular rhythm.  Pulmonary/Chest: Effort normal and breath sounds normal.  Musculoskeletal:  Normal Muscle Bulk and Muscle Testing Reveals: Upper Extremities: Full ROM and Muscle Strength 5/5 Lumbar Paraspinal Tenderness: L-4-L-5 Mainly right side Right Greater Trochanter Tenderness Lower Extremities: Right: Decreased ROM and Muscle Strength 4/5 Left: Full ROM and Muscle Strength 5/5 Right Lower Extremity Flexion Produces Pain into Right Lower Extremity and Patella Arises from Table slowly using straight cane for support Antalgic Gait  Neurological: He is alert and oriented to person, place, and time.  Skin: Skin is warm and dry. No erythema.  Left Finger (Third Digit) with  redness noted  Psychiatric: He has a normal mood and affect.  Nursing note and vitals reviewed.         Assessment & Plan:  1. Patellofemoral arthritis. Continue Voltaren gel, and continue with exercise regimen. 03/29/2017 2 Chronic Low Back Pain/ Lumbar Stenosis:Continue HEP as  Tolerated and Continue to Monitor  03/29/2017 3. Chronic Pain: Refilled: Hydrocodone 5/325 mg one tablet three times a day #90. 03/29/2017 We will continue the opioid monitoring program, this consists of regular clinic visits, examinations, urine drug screen, pill counts as well as use of [place] Controlled Substance reporting System. 4. Muscle Spasm: Continue Flexeril . 03/29/2017  20 minutes of face to face patient care time was spent during this visit. All questions were encouraged and answered.  F/U in 1 month

## 2017-03-31 ENCOUNTER — Other Ambulatory Visit: Payer: Self-pay | Admitting: Cardiology

## 2017-04-19 ENCOUNTER — Telehealth: Payer: Self-pay | Admitting: Internal Medicine

## 2017-04-19 NOTE — Telephone Encounter (Signed)
Gave pt. Date of last shingles vaccine.

## 2017-04-19 NOTE — Telephone Encounter (Signed)
Copied from [place] [id number]. Topic: Quick Communication - See Telephone Encounter >> Apr 19, 2017  8:10 AM Synthia Innocent wrote: CRM for notification. See Telephone encounter for:  Patient would like to know when his last shingles injection was, I don't see listed. Please advise 04/19/17.

## 2017-05-01 ENCOUNTER — Other Ambulatory Visit: Payer: Self-pay

## 2017-05-01 ENCOUNTER — Encounter: Payer: Self-pay | Admitting: Registered Nurse

## 2017-05-01 ENCOUNTER — Encounter: Payer: Medicare Other | Attending: Registered Nurse | Admitting: Registered Nurse

## 2017-05-01 VITALS — BP 124/72 | HR 69 | Resp 14

## 2017-05-01 DIAGNOSIS — M62838 Other muscle spasm: Secondary | ICD-10-CM

## 2017-05-01 DIAGNOSIS — G894 Chronic pain syndrome: Secondary | ICD-10-CM

## 2017-05-01 DIAGNOSIS — M1711 Unilateral primary osteoarthritis, right knee: Secondary | ICD-10-CM | POA: Insufficient documentation

## 2017-05-01 DIAGNOSIS — Z5181 Encounter for therapeutic drug level monitoring: Secondary | ICD-10-CM

## 2017-05-01 DIAGNOSIS — Z79899 Other long term (current) drug therapy: Secondary | ICD-10-CM | POA: Diagnosis not present

## 2017-05-01 DIAGNOSIS — M1611 Unilateral primary osteoarthritis, right hip: Secondary | ICD-10-CM | POA: Diagnosis not present

## 2017-05-01 DIAGNOSIS — M48062 Spinal stenosis, lumbar region with neurogenic claudication: Secondary | ICD-10-CM | POA: Diagnosis not present

## 2017-05-01 DIAGNOSIS — M25561 Pain in right knee: Secondary | ICD-10-CM

## 2017-05-01 MED ORDER — HYDROCODONE-ACETAMINOPHEN 5-325 MG PO TABS: 1.0000 | ORAL_TABLET | Freq: Three times a day (TID) | ORAL | 0 refills | Status: DC

## 2017-05-01 NOTE — Progress Notes (Signed)
Subjective:    Patient ID: Kristopher [name], male    DOB: 1930/09/27, 84 y.o.   MRN: [id number]  HPI: [name] is a 84year old male who returns for follow up appointmentfor chronic pain and medication refill. He states his pain is located in his right knee and right leg. Also reports occasionally lower back pain with activity.  He rates his pain 3. His current exercise regime is walking and performing stretching exercises.    [name] equivalent is 13.50 MME.    Kristopher [name] last UDS was on 09/19/2016, it was consistent. Oral Swab Performed Today.  Pain Inventory Average Pain 3 Pain Right Now 3 My pain is dull and stabbing  In the last 24 hours, has pain interfered with the following? General activity 3 Relation with others 3 Enjoyment of life 3 What TIME of day is your pain at its worst? daytime Sleep (in general) Good  Pain is worse with: unsure Pain improves with: rest and medication Relief from Meds: 8  Mobility use a cane  Function retired  Neuro/Psych No problems in this area  Prior Studies Any changes since last visit?  no  Physicians involved in your care Any changes since last visit?  no   Family History  Problem Relation Age of Onset  . Heart disease Mother   . Heart disease Father    Social History   Socioeconomic History  . Marital status: Widowed    Spouse name: None  . Number of children: None  . Years of education: GED  . Highest education level: None  Social Needs  . Financial resource strain: None  . Food insecurity - worry: None  . Food insecurity - inability: None  . Transportation needs - medical: None  . Transportation needs - non-medical: None  Occupational History  . Occupation: retired  Tobacco Use  . Smoking status: Former Smoker    Packs/day: 3.00    Years: 38.00    Pack years: 114.00    Last attempt to quit: 04/10/1988    Years since quitting: 29.0  . Smokeless tobacco: Never Used  Substance and Sexual  Activity  . Alcohol use: No    Comment: rarely  . Drug use: No  . Sexual activity: Yes  Other Topics Concern  . None  Social History Narrative  . None   Past Surgical History:  Procedure Laterality Date  . CATARACT EXTRACTION W/ INTRAOCULAR LENS  IMPLANT, BILATERAL  YRS AGO  . CORONARY ANGIOPLASTY WITH STENT PLACEMENT    . [place]  . LOWER EXTREMITY ANGIOGRAM Bilateral 09/20/2011   Procedure: LOWER EXTREMITY ANGIOGRAM;  Surgeon: Wellington Hampshire, MD;  Location: [facility] CATH [facility];  Service: Cardiovascular;  Laterality: Bilateral;  . PCI of LAD  05/1996   STENT X 4 TOTAL  . TRANSURETHRAL RESECTION OF PROSTATE  08/11/2011   Procedure: TRANSURETHRAL RESECTION OF THE PROSTATE WITH GYRUS INSTRUMENTS;  Surgeon: Fredricka Bonine, MD;  Location: [facility];  Service: Urology;  Laterality: N/A;     . VASECTOMY  1968   Past Medical History:  Diagnosis Date  . AAA (abdominal aortic aneurysm) ([place])    ultrasound 07/2011:  2.7 x 2.8 cm  . Acute myocardial infarction, unspecified site, episode of care unspecified 1998  . Aortic stenosis    echo 4/12: normal LVF, mild LAE, no significant AS  . CAD (coronary artery disease)    s/p PCI of LAD 1998;  s/p DES to RCA  05/2009;  Myoview 4/13: low risk, no ischemia, EF 56%  . COPD (chronic obstructive pulmonary disease) ([place])   . DJD (degenerative joint disease), lumbar 07/06/2011  . Esophageal reflux   . HLD (hyperlipidemia)   . HTN (hypertension)   . PAD (peripheral artery disease) ([place])    s/p L CIA stent 09/2011 ([place])  . Renal artery stenosis ([facility])    s/p bilat RA stents  . Urine incontinence    BP (!) 144/72   Pulse 75   Resp 14   Opioid Risk Score:  0 Fall Risk Score:  `1  Depression screen PHQ 2/9  Depression screen [facility] 2/9 05/01/2017 03/29/2017 02/26/2017 12/26/2016 08/22/2016 05/05/2016 01/26/2016  Decreased Interest 0 0 0 0 0 0 0  Down, Depressed, Hopeless 0 0 0 0 0 0 0  PHQ - 2 Score 0 0 0 0 0 0 0  Altered sleeping - -  - - - - -  Tired, decreased energy - - - - - - -  Change in appetite - - - - - - -  Feeling bad or failure about yourself  - - - - - - -  Trouble concentrating - - - - - - -  Moving slowly or fidgety/restless - - - - - - -  Suicidal thoughts - - - - - - -  PHQ-9 Score - - - - - - -     Review of Systems  Constitutional: Negative.   HENT: Negative.   Eyes: Negative.   Respiratory: Negative.   Cardiovascular: Negative.   Gastrointestinal: Negative.   Endocrine: Negative.   Genitourinary: Negative.   Musculoskeletal: Positive for gait problem and joint swelling.  Skin: Negative.   Allergic/Immunologic: Negative.   Hematological: Negative.   Psychiatric/Behavioral: Negative.   All other systems reviewed and are negative.      Objective:   Physical Exam  Constitutional: He is oriented to person, place, and time. He appears well-developed and well-nourished.  HENT:  Head: Normocephalic and atraumatic.  Neck: Normal range of motion. Neck supple.  Cardiovascular: Normal rate and regular rhythm.  Pulmonary/Chest: Effort normal and breath sounds normal.  Musculoskeletal:  Normal Muscle Bulk and Muscle Testing Reveals: Upper Extremities: Full ROM and Muscle Strength 5/5 Back without spinal tenderness noted Lower Extremities: Right: Decreased ROM and Muscle Strength 4/5. Right Lower Extremity Flexion Produces Pain into Patella Left: Full ROM and Muscle Strength 5/5 Arises from Table slowly using straight cane for support Antalgic Gait  Neurological: He is alert and oriented to person, place, and time.  Skin: Skin is warm and dry. No erythema.  Left Finger (Third Digit) with  redness noted  Psychiatric: He has a normal mood and affect.  Nursing note and vitals reviewed.         Assessment & Plan:  1. Patellofemoral arthritis. Continue Voltaren gel, and continue with exercise regimen. 05/01/2017 2 Chronic Low Back Pain/ Lumbar Stenosis:Continue HEP as Tolerated and  Continue to Monitor. 05/01/2017 3. Chronic Pain: Refilled: Hydrocodone 5/325 mg one tablet three times a day #90. 05/01/2017 We will continue the opioid monitoring program, this consists of regular clinic visits, examinations, urine drug screen, pill counts as well as use of [place] Controlled Substance reporting System. 4. Muscle Spasm: Continue Flexeril. 05/01/2017  20 minutes of face to face patient care time was spent during this visit. All questions were encouraged and answered.  F/U in 1 month

## 2017-05-05 LAB — DRUG TOX MONITOR 1 W/CONF, ORAL FLD
Amphetamines: NEGATIVE ng/mL (ref <10)
Barbiturates: NEGATIVE ng/mL (ref <10)
Benzodiazepines: NEGATIVE ng/mL (ref <0.50)
Buprenorphine: NEGATIVE ng/mL (ref <0.10)
Cocaine: NEGATIVE ng/mL (ref <5.0)
Codeine: NEGATIVE ng/mL (ref <2.5)
Dihydrocodeine: NEGATIVE ng/mL (ref <2.5)
Fentanyl: NEGATIVE ng/mL (ref <0.10)
Heroin Metabolite: NEGATIVE ng/mL (ref <1.0)
Hydrocodone: 36.3 ng/mL — ABNORMAL HIGH (ref <2.5)
Hydromorphone: NEGATIVE ng/mL (ref <2.5)
MARIJUANA: NEGATIVE ng/mL (ref <2.5)
MDMA: NEGATIVE ng/mL (ref <10)
Meprobamate: NEGATIVE ng/mL (ref <2.5)
Methadone: NEGATIVE ng/mL (ref <5.0)
Morphine: NEGATIVE ng/mL (ref <2.5)
Nicotine Metabolite: NEGATIVE ng/mL (ref <5.0)
Norhydrocodone: 4.7 ng/mL — ABNORMAL HIGH (ref <2.5)
Noroxycodone: NEGATIVE ng/mL (ref <2.5)
Opiates: POSITIVE ng/mL — AB (ref <2.5)
Oxycodone: NEGATIVE ng/mL (ref <2.5)
Oxymorphone: NEGATIVE ng/mL (ref <2.5)
Phencyclidine: NEGATIVE ng/mL (ref <10)
Tapentadol: NEGATIVE ng/mL (ref <5.0)
Tramadol: NEGATIVE ng/mL (ref <5.0)
Zolpidem: NEGATIVE ng/mL (ref <5.0)

## 2017-05-05 LAB — DRUG TOX ALC METAB W/CON, ORAL FLD: Alcohol Metabolite: NEGATIVE ng/mL (ref <25)

## 2017-05-08 ENCOUNTER — Telehealth: Payer: Self-pay | Admitting: *Deleted

## 2017-05-08 NOTE — Telephone Encounter (Signed)
Oral swab drug screen was consistent for prescribed medications.  ?

## 2017-05-21 ENCOUNTER — Emergency Department (HOSPITAL_COMMUNITY): Payer: Medicare Other

## 2017-05-21 ENCOUNTER — Observation Stay (HOSPITAL_COMMUNITY)
Admission: EM | Admit: 2017-05-21 | Discharge: 2017-05-22 | Disposition: A | Discharge status: Home / Self Care | Payer: Medicare Other | Attending: Internal Medicine | Admitting: Internal Medicine

## 2017-05-21 ENCOUNTER — Other Ambulatory Visit: Payer: Self-pay

## 2017-05-21 ENCOUNTER — Encounter (HOSPITAL_COMMUNITY): Payer: Self-pay

## 2017-05-21 DIAGNOSIS — J9600 Acute respiratory failure, unspecified whether with hypoxia or hypercapnia: Secondary | ICD-10-CM | POA: Diagnosis not present

## 2017-05-21 DIAGNOSIS — Z887 Allergy status to serum and vaccine status: Secondary | ICD-10-CM | POA: Insufficient documentation

## 2017-05-21 DIAGNOSIS — J189 Pneumonia, unspecified organism: Secondary | ICD-10-CM

## 2017-05-21 DIAGNOSIS — Z7982 Long term (current) use of aspirin: Secondary | ICD-10-CM | POA: Diagnosis not present

## 2017-05-21 DIAGNOSIS — J449 Chronic obstructive pulmonary disease, unspecified: Secondary | ICD-10-CM | POA: Diagnosis present

## 2017-05-21 DIAGNOSIS — N179 Acute kidney failure, unspecified: Secondary | ICD-10-CM | POA: Insufficient documentation

## 2017-05-21 DIAGNOSIS — Z8249 Family history of ischemic heart disease and other diseases of the circulatory system: Secondary | ICD-10-CM | POA: Diagnosis not present

## 2017-05-21 DIAGNOSIS — Z955 Presence of coronary angioplasty implant and graft: Secondary | ICD-10-CM | POA: Insufficient documentation

## 2017-05-21 DIAGNOSIS — G894 Chronic pain syndrome: Secondary | ICD-10-CM | POA: Diagnosis not present

## 2017-05-21 DIAGNOSIS — I350 Nonrheumatic aortic (valve) stenosis: Secondary | ICD-10-CM | POA: Insufficient documentation

## 2017-05-21 DIAGNOSIS — Z87891 Personal history of nicotine dependence: Secondary | ICD-10-CM | POA: Diagnosis not present

## 2017-05-21 DIAGNOSIS — I10 Essential (primary) hypertension: Secondary | ICD-10-CM | POA: Diagnosis not present

## 2017-05-21 DIAGNOSIS — I441 Atrioventricular block, second degree: Secondary | ICD-10-CM | POA: Diagnosis not present

## 2017-05-21 DIAGNOSIS — K219 Gastro-esophageal reflux disease without esophagitis: Secondary | ICD-10-CM | POA: Diagnosis not present

## 2017-05-21 DIAGNOSIS — R05 Cough: Secondary | ICD-10-CM | POA: Diagnosis not present

## 2017-05-21 DIAGNOSIS — I359 Nonrheumatic aortic valve disorder, unspecified: Secondary | ICD-10-CM | POA: Diagnosis present

## 2017-05-21 DIAGNOSIS — Z79899 Other long term (current) drug therapy: Secondary | ICD-10-CM | POA: Insufficient documentation

## 2017-05-21 DIAGNOSIS — I2510 Atherosclerotic heart disease of native coronary artery without angina pectoris: Secondary | ICD-10-CM | POA: Diagnosis not present

## 2017-05-21 DIAGNOSIS — I714 Abdominal aortic aneurysm, without rupture: Secondary | ICD-10-CM | POA: Diagnosis not present

## 2017-05-21 DIAGNOSIS — J440 Chronic obstructive pulmonary disease with acute lower respiratory infection: Principal | ICD-10-CM | POA: Insufficient documentation

## 2017-05-21 DIAGNOSIS — R0602 Shortness of breath: Secondary | ICD-10-CM | POA: Diagnosis not present

## 2017-05-21 DIAGNOSIS — J1000 Influenza due to other identified influenza virus with unspecified type of pneumonia: Secondary | ICD-10-CM | POA: Diagnosis not present

## 2017-05-21 DIAGNOSIS — I739 Peripheral vascular disease, unspecified: Secondary | ICD-10-CM | POA: Insufficient documentation

## 2017-05-21 DIAGNOSIS — A419 Sepsis, unspecified organism: Secondary | ICD-10-CM | POA: Insufficient documentation

## 2017-05-21 DIAGNOSIS — Z882 Allergy status to sulfonamides status: Secondary | ICD-10-CM | POA: Diagnosis not present

## 2017-05-21 DIAGNOSIS — E785 Hyperlipidemia, unspecified: Secondary | ICD-10-CM | POA: Insufficient documentation

## 2017-05-21 DIAGNOSIS — I252 Old myocardial infarction: Secondary | ICD-10-CM | POA: Insufficient documentation

## 2017-05-21 DIAGNOSIS — I4510 Unspecified right bundle-branch block: Secondary | ICD-10-CM | POA: Insufficient documentation

## 2017-05-21 DIAGNOSIS — I700 Atherosclerosis of aorta: Secondary | ICD-10-CM | POA: Insufficient documentation

## 2017-05-21 DIAGNOSIS — Z9861 Coronary angioplasty status: Secondary | ICD-10-CM

## 2017-05-21 HISTORY — DX: Pneumonia, unspecified organism: J18.9

## 2017-05-21 HISTORY — DX: Unspecified osteoarthritis, unspecified site: M19.90

## 2017-05-21 LAB — COMPREHENSIVE METABOLIC PANEL
ALT: 60 U/L (ref 17–63)
AST: 92 U/L — ABNORMAL HIGH (ref 15–41)
Albumin: 3.3 g/dL — ABNORMAL LOW (ref 3.5–5.0)
Alkaline Phosphatase: 77 U/L (ref 38–126)
Anion gap: 13 (ref 5–15)
BUN: 31 mg/dL — ABNORMAL HIGH (ref 6–20)
CO2: 21 mmol/L — ABNORMAL LOW (ref 22–32)
Calcium: 8.3 mg/dL — ABNORMAL LOW (ref 8.9–10.3)
Chloride: 104 mmol/L (ref 101–111)
Creatinine, Ser: 1.6 mg/dL — ABNORMAL HIGH (ref 0.61–1.24)
GFR calc Af Amer: 44 mL/min — ABNORMAL LOW (ref >60)
GFR calc non Af Amer: 38 mL/min — ABNORMAL LOW (ref >60)
Glucose, Bld: 151 mg/dL — ABNORMAL HIGH (ref 65–99)
Potassium: 4.5 mmol/L (ref 3.5–5.1)
Sodium: 138 mmol/L (ref 135–145)
Total Bilirubin: 0.9 mg/dL (ref 0.3–1.2)
Total Protein: 6.5 g/dL (ref 6.5–8.1)

## 2017-05-21 LAB — RESPIRATORY PANEL BY PCR
Adenovirus: NOT DETECTED
Bordetella pertussis: NOT DETECTED
Chlamydophila pneumoniae: NOT DETECTED
Coronavirus 229E: NOT DETECTED
Coronavirus HKU1: NOT DETECTED
Coronavirus NL63: NOT DETECTED
Coronavirus OC43: NOT DETECTED
Influenza A H1 2009: DETECTED — AB
Influenza A H1: NOT DETECTED
Influenza A H3: NOT DETECTED
Influenza A: NOT DETECTED
Influenza B: NOT DETECTED
Metapneumovirus: NOT DETECTED
Mycoplasma pneumoniae: NOT DETECTED
Parainfluenza Virus 1: NOT DETECTED
Parainfluenza Virus 2: NOT DETECTED
Parainfluenza Virus 3: NOT DETECTED
Parainfluenza Virus 4: NOT DETECTED
Respiratory Syncytial Virus: NOT DETECTED
Rhinovirus / Enterovirus: NOT DETECTED

## 2017-05-21 LAB — CBC WITH DIFFERENTIAL/PLATELET
Basophils Absolute: 0 10*3/uL (ref 0.0–0.1)
Basophils Relative: 0 %
Eosinophils Absolute: 0 10*3/uL (ref 0.0–0.7)
Eosinophils Relative: 0 %
HCT: 42.1 % (ref 39.0–52.0)
Hemoglobin: 13.9 g/dL (ref 13.0–17.0)
Lymphocytes Relative: 23 %
Lymphs Abs: 1.3 10*3/uL (ref 0.7–4.0)
MCH: 30.5 pg (ref 26.0–34.0)
MCHC: 33 g/dL (ref 30.0–36.0)
MCV: 92.5 fL (ref 78.0–100.0)
Monocytes Absolute: 0.5 10*3/uL (ref 0.1–1.0)
Monocytes Relative: 8 %
Neutro Abs: 3.8 10*3/uL (ref 1.7–7.7)
Neutrophils Relative %: 69 %
Platelets: 138 10*3/uL — ABNORMAL LOW (ref 150–400)
RBC: 4.55 MIL/uL (ref 4.22–5.81)
RDW: 15.1 % (ref 11.5–15.5)
WBC: 5.6 10*3/uL (ref 4.0–10.5)

## 2017-05-21 LAB — URINALYSIS, ROUTINE W REFLEX MICROSCOPIC
Bilirubin Urine: NEGATIVE
Glucose, UA: NEGATIVE mg/dL
Hgb urine dipstick: NEGATIVE
Ketones, ur: 5 mg/dL — AB
Leukocytes, UA: NEGATIVE
Nitrite: NEGATIVE
Protein, ur: 30 mg/dL — AB
Specific Gravity, Urine: 1.018 (ref 1.005–1.030)
pH: 5 (ref 5.0–8.0)

## 2017-05-21 LAB — INFLUENZA PANEL BY PCR (TYPE A & B)
Influenza A By PCR: POSITIVE — AB
Influenza B By PCR: NEGATIVE

## 2017-05-21 LAB — GLUCOSE, CAPILLARY
Glucose-Capillary: 107 mg/dL — ABNORMAL HIGH (ref 65–99)
Glucose-Capillary: 137 mg/dL — ABNORMAL HIGH (ref 65–99)

## 2017-05-21 LAB — I-STAT TROPONIN, ED: Troponin i, poc: 0.04 ng/mL (ref 0.00–0.08)

## 2017-05-21 LAB — I-STAT CG4 LACTIC ACID, ED: Lactic Acid, Venous: 2.35 mmol/L (ref 0.5–1.9)

## 2017-05-21 LAB — LACTIC ACID, PLASMA
Lactic Acid, Venous: 2.2 mmol/L (ref 0.5–1.9)
Lactic Acid, Venous: 1.5 mmol/L (ref 0.5–1.9)

## 2017-05-21 LAB — PROCALCITONIN: Procalcitonin: <0.1 ng/mL

## 2017-05-21 MED ORDER — SODIUM CHLORIDE 0.9 % IV BOLUS (SEPSIS)
1000.0000 mL | Freq: Once | INTRAVENOUS | Status: AC
Start: 2017-05-21 — End: 2017-05-21
  Administered 2017-05-21: 1000 mL via INTRAVENOUS

## 2017-05-21 MED ORDER — SODIUM CHLORIDE 0.9 % IV SOLN
1.0000 g | INTRAVENOUS | Status: DC
  Administered 2017-05-22: 1 g via INTRAVENOUS
  Filled 2017-05-21: qty 10

## 2017-05-21 MED ORDER — SENNOSIDES-DOCUSATE SODIUM 8.6-50 MG PO TABS: 1.0000 | ORAL_TABLET | Freq: Every evening | ORAL | Status: DC | PRN

## 2017-05-21 MED ORDER — SODIUM CHLORIDE 0.9 % IV BOLUS (SEPSIS)
500.0000 mL | Freq: Once | INTRAVENOUS | Status: AC
  Administered 2017-05-21: 500 mL via INTRAVENOUS

## 2017-05-21 MED ORDER — IPRATROPIUM-ALBUTEROL 0.5-2.5 (3) MG/3ML IN SOLN
3.0000 mL | Freq: Two times a day (BID) | RESPIRATORY_TRACT | Status: DC
  Administered 2017-05-22: 3 mL via RESPIRATORY_TRACT
  Filled 2017-05-21: qty 3

## 2017-05-21 MED ORDER — KETOROLAC TROMETHAMINE 15 MG/ML IJ SOLN: 15.0000 mg | Freq: Four times a day (QID) | INTRAMUSCULAR | Status: DC | PRN

## 2017-05-21 MED ORDER — ONDANSETRON HCL 4 MG PO TABS: 4.0000 mg | ORAL_TABLET | Freq: Four times a day (QID) | ORAL | Status: DC | PRN

## 2017-05-21 MED ORDER — PANTOPRAZOLE SODIUM 40 MG PO TBEC
40.0000 mg | DELAYED_RELEASE_TABLET | Freq: Every day | ORAL | Status: DC
  Administered 2017-05-22: 40 mg via ORAL
  Filled 2017-05-21: qty 1

## 2017-05-21 MED ORDER — ACETAMINOPHEN 325 MG PO TABS
650.0000 mg | ORAL_TABLET | Freq: Four times a day (QID) | ORAL | Status: DC | PRN
  Administered 2017-05-21: 650 mg via ORAL

## 2017-05-21 MED ORDER — SODIUM CHLORIDE 0.9 % IV SOLN
INTRAVENOUS | Status: DC
  Administered 2017-05-21: 16:00:00 via INTRAVENOUS

## 2017-05-21 MED ORDER — ENOXAPARIN SODIUM 40 MG/0.4ML ~~LOC~~ SOLN
40.0000 mg | SUBCUTANEOUS | Status: DC
  Administered 2017-05-21: 40 mg via SUBCUTANEOUS
  Filled 2017-05-21 (×2): qty 0.4

## 2017-05-21 MED ORDER — ASPIRIN EC 325 MG PO TBEC
325.0000 mg | DELAYED_RELEASE_TABLET | Freq: Every day | ORAL | Status: DC
  Administered 2017-05-22: 325 mg via ORAL
  Filled 2017-05-21: qty 1

## 2017-05-21 MED ORDER — TRAZODONE HCL 50 MG PO TABS
25.0000 mg | ORAL_TABLET | Freq: Every evening | ORAL | Status: DC | PRN
  Filled 2017-05-21: qty 1

## 2017-05-21 MED ORDER — INSULIN ASPART 100 UNIT/ML ~~LOC~~ SOLN
0.0000 [IU] | Freq: Three times a day (TID) | SUBCUTANEOUS | Status: DC
  Administered 2017-05-22: 2 [IU] via SUBCUTANEOUS
  Administered 2017-05-22: 1 [IU] via SUBCUTANEOUS

## 2017-05-21 MED ORDER — SODIUM CHLORIDE 0.9 % IV SOLN
1.0000 g | Freq: Once | INTRAVENOUS | Status: AC
  Administered 2017-05-21: 1 g via INTRAVENOUS

## 2017-05-21 MED ORDER — ACETAMINOPHEN 650 MG RE SUPP: 650.0000 mg | Freq: Four times a day (QID) | RECTAL | Status: DC | PRN

## 2017-05-21 MED ORDER — OSELTAMIVIR PHOSPHATE 30 MG PO CAPS
30.0000 mg | ORAL_CAPSULE | Freq: Two times a day (BID) | ORAL | Status: DC
  Administered 2017-05-21 – 2017-05-22 (×2): 30 mg via ORAL
  Filled 2017-05-21 (×3): qty 1

## 2017-05-21 MED ORDER — PREDNISONE 20 MG PO TABS
40.0000 mg | ORAL_TABLET | Freq: Every day | ORAL | Status: DC
  Administered 2017-05-21 – 2017-05-22 (×2): 40 mg via ORAL
  Filled 2017-05-21 (×2): qty 2

## 2017-05-21 MED ORDER — IPRATROPIUM-ALBUTEROL 0.5-2.5 (3) MG/3ML IN SOLN
3.0000 mL | Freq: Four times a day (QID) | RESPIRATORY_TRACT | Status: DC
  Administered 2017-05-21: 3 mL via RESPIRATORY_TRACT
  Filled 2017-05-21: qty 3

## 2017-05-21 MED ORDER — ATORVASTATIN CALCIUM 80 MG PO TABS
80.0000 mg | ORAL_TABLET | Freq: Every day | ORAL | Status: DC
  Administered 2017-05-21: 80 mg via ORAL
  Filled 2017-05-21: qty 1

## 2017-05-21 MED ORDER — SODIUM CHLORIDE 0.9 % IV SOLN
500.0000 mg | Freq: Once | INTRAVENOUS | Status: AC
  Administered 2017-05-21: 500 mg via INTRAVENOUS
  Filled 2017-05-21: qty 500

## 2017-05-21 MED ORDER — ONDANSETRON HCL 4 MG/2ML IJ SOLN: 4.0000 mg | Freq: Four times a day (QID) | INTRAMUSCULAR | Status: DC | PRN

## 2017-05-21 MED ORDER — AZITHROMYCIN 500 MG PO TABS
500.0000 mg | ORAL_TABLET | ORAL | Status: DC
  Administered 2017-05-22: 500 mg via ORAL
  Filled 2017-05-21: qty 1

## 2017-05-21 MED ORDER — IPRATROPIUM-ALBUTEROL 0.5-2.5 (3) MG/3ML IN SOLN
3.0000 mL | Freq: Once | RESPIRATORY_TRACT | Status: DC
Start: 2017-05-21 — End: 2017-05-21

## 2017-05-21 NOTE — Progress Notes (Signed)
Results for RIGHTEOUS, CLAIBORNE (MRN [id number]) as of 05/21/2017 18:43  Ref. Range 05/21/2017 17:29  Lactic Acid, Venous Latest Ref Range: 0.5 - 1.9 mmol/L 2.2 (HH)   Dr. Lorin Mercy notified via [place]. Will continue to monitor pt.

## 2017-05-21 NOTE — ED Notes (Signed)
Admitting at bedside 

## 2017-05-21 NOTE — ED Provider Notes (Signed)
[facility] EMERGENCY DEPARTMENT Provider Note   CSN: [id number] Arrival date & time: 05/21/17  1220     History   Chief Complaint Chief Complaint  Patient presents with  . Cough    HPI Kristopher [name] is a 84 y.o. male.  HPI   84 year old male presents today with complaints of shortness of breath.  Patient's daughter is at bedside reports that over the last week he has had cough, chest congestion fever and chills and generalized weakness.  Daughter notes that usually patient is up and active and is able to walk down to get the mail, she notes that recently he has been significant short of breath and cannot do this.  She notes that he has had very little p.o. intake as he gets severely nauseous after eating or drinking.  Patient reports a history of chest pain, notes he did have an episode of chest pain today while in x-ray and took 1 of his nitroglycerin.  Shortly after he was placed in the waiting room, staff was called out by family noting that he slumped over and was nonresponsive to verbal and physical stimuli.  Staff arrived noting that patient was alert and oriented.  Patient does not recall this event.  Patient does note a remote history of smoking, no longer smoke  Past Medical History:  Diagnosis Date  . AAA (abdominal aortic aneurysm) ([place])    ultrasound 07/2011:  2.7 x 2.8 cm  . Acute myocardial infarction, unspecified site, episode of care unspecified 1998  . Aortic stenosis    echo 4/12: normal LVF, mild LAE, no significant AS  . CAD (coronary artery disease)    s/p PCI of LAD 1998;  s/p DES to RCA 05/2009;  Myoview 4/13: low risk, no ischemia, EF 56%  . COPD (chronic obstructive pulmonary disease) ([place])   . DJD (degenerative joint disease), lumbar 07/06/2011  . Esophageal reflux   . HLD (hyperlipidemia)   . HTN (hypertension)   . PAD (peripheral artery disease) ([place])    s/p L CIA stent 09/2011 ([place])  . Renal artery stenosis ([facility])    s/p bilat RA  stents  . Urine incontinence     Patient Active Problem List   Diagnosis Date Noted  . CAP (community acquired pneumonia) 05/21/2017  . Acute kidney injury ([employer]) 05/21/2017  . Finger swelling 12/18/2016  . Paronychia of left middle finger 12/10/2016  . Spinal stenosis, lumbar region, with neurogenic claudication 08/31/2014  . Right lumbar radiculitis 08/04/2014  . Chronic low back pain 07/02/2014  . RBBB 06/18/2014  . Patellofemoral arthralgia of right knee 03/24/2014  . Osteoarthritis of right hip 03/24/2014  . Dupuytren's contracture of both hands 03/24/2014  . Ankle impingement syndrome 11/06/2013  . Liver mass 10/21/2013  . Low back pain 08/31/2013  . AV block, 2nd degree 08/31/2013  . Chronic pain syndrome 04/17/2013  . Right hand pain 07/11/2012  . Peripheral artery disease ([place]) 10/04/2011  . COPD (chronic obstructive pulmonary disease) ([place]) 07/06/2011  . DJD (degenerative joint disease), lumbar 07/06/2011  . Preventative health care 07/06/2011  . Abdominal pain 05/25/2011  . Pancreatitis 05/24/2011  . Abdominal aortic aneurysm ([place]) 06/13/2010  . Hyperlipidemia 05/18/2009  . Essential hypertension 05/18/2009  . MI 05/18/2009  . CAD S/P LAD PCI 1998, RCA DES 2011 05/18/2009  . Aortic valve disorder 05/18/2009  . RENAL ARTERY STENOSIS 05/18/2009  . Peripheral vascular disease ([place]) 05/18/2009  . GERD 05/18/2009  . DYSPNEA 05/18/2009  Past Surgical History:  Procedure Laterality Date  . CATARACT EXTRACTION W/ INTRAOCULAR LENS  IMPLANT, BILATERAL  YRS AGO  . CORONARY ANGIOPLASTY WITH STENT PLACEMENT    . [place]  . LOWER EXTREMITY ANGIOGRAM Bilateral 09/20/2011   Procedure: LOWER EXTREMITY ANGIOGRAM;  Surgeon: Wellington Hampshire, MD;  Location: [facility] CATH [facility];  Service: Cardiovascular;  Laterality: Bilateral;  . PCI of LAD  05/1996   STENT X 4 TOTAL  . TRANSURETHRAL RESECTION OF PROSTATE  08/11/2011   Procedure: TRANSURETHRAL RESECTION OF THE PROSTATE  WITH GYRUS INSTRUMENTS;  Surgeon: Fredricka Bonine, MD;  Location: [facility] OR[facility];  Service: Urology;  Laterality: N/A;     . [place] Medications    Prior to Admission medications   Medication Sig Start Date End Date Taking? Authorizing Provider  ALTACE 10 MG capsule TAKE 1 CAPSULE DAILY 04/02/17   Lelon Perla, MD  Ascorbic Acid (VITAMIN C) 500 MG tablet Take 500 mg by mouth 2 (two) times daily.     [provider]  aspirin 325 MG EC tablet Take 325 mg by mouth daily.    [provider]  atorvastatin (LIPITOR) 80 MG tablet Take 1 tablet (80 mg total) by mouth daily. 07/24/16   Lelon Perla, MD  BEE POLLEN PO Take by mouth.    [provider]  Co-Enzyme Q-10 100 MG CAPS Take 2 capsules by mouth daily.     [provider]  cyclobenzaprine (FLEXERIL) 5 MG tablet Take 1 tablet (5 mg total) by mouth at bedtime as needed for muscle spasms. 03/22/17   Bayard Hugger, NP  diclofenac sodium (VOLTAREN) 1 % GEL Apply 2 g topically 4 (four) times daily as needed. 12/26/16   Bayard Hugger, NP  Docusate Calcium (STOOL SOFTENER PO) Take 2 capsules by mouth daily.    [provider]  fish oil-omega-3 fatty acids 1000 MG capsule Take 1 g by mouth daily.     [provider]  HYDROcodone-acetaminophen (NORCO/VICODIN) 5-325 MG tablet Take 1 tablet by mouth 3 (three) times daily before meals. 05/01/17   Bayard Hugger, NP  Multiple Vitamin (MULTIVITAMIN) capsule Take 1 capsule by mouth daily.     [provider]  Multiple Vitamins-Minerals (PRESERVISION AREDS PO) Take 2 tablets by mouth daily.    [provider]  nitroGLYCERIN (NITROSTAT) 0.4 MG SL tablet Place 1 tablet (0.4 mg total) under the tongue every 5 (five) minutes as needed for chest pain. 07/14/14   Lelon Perla, MD  omeprazole (PRILOSEC) 20 MG capsule TAKE 2 CAPSULES DAILY 07/03/16   [name], MD    Family History Family History    Problem Relation Age of Onset  . Heart disease Mother   . Heart disease Father     Social History Social History   Tobacco Use  . Smoking status: Former Smoker    Packs/day: 3.00    Years: 38.00    Pack years: 114.00    Last attempt to quit: 04/10/1988    Years since quitting: 29.1  . Smokeless tobacco: Never Used  Substance Use Topics  . Alcohol use: No    Comment: rarely  . Drug use: No     Allergies   Influenza vaccines and Sulfa antibiotics   Review of Systems Review of Systems  All other systems reviewed and are negative.   Physical Exam Updated Vital Signs BP (!) 100/53 (BP Location:  Right Arm)   Pulse 83   Temp 99.4 F (37.4 C) (Oral)   Resp 18   Ht 5\' 9"  (1.753 m)   Wt 63.5 kg (140 lb)   SpO2 95%   BMI 20.67 kg/m   Physical Exam  Constitutional: He is oriented to person, place, and time. He appears well-developed and well-nourished.  HENT:  Head: Normocephalic and atraumatic.  Eyes: Conjunctivae are normal. Pupils are equal, round, and reactive to light. Right eye exhibits no discharge. Left eye exhibits no discharge. No scleral icterus.  Neck: Normal range of motion. No JVD present. No tracheal deviation present.  Cardiovascular: Regular rhythm and normal heart sounds.  Pulmonary/Chest: Effort normal and breath sounds normal. No stridor. No respiratory distress. He has no wheezes. He has no rales. He exhibits no tenderness.  Fine crackles left lower lobe  Neurological: He is alert and oriented to person, place, and time. Coordination normal.  Skin: Skin is warm.  Psychiatric: He has a normal mood and affect. His behavior is normal. Judgment and thought content normal.  Nursing note and vitals reviewed.    ED Treatments / Results  Labs (all labs ordered are listed, but only abnormal results are displayed) Labs Reviewed  COMPREHENSIVE METABOLIC PANEL - Abnormal; Notable for the following components:      Result Value   CO2 21 (*)    Glucose,  Bld 151 (*)    BUN 31 (*)    Creatinine, Ser 1.60 (*)    Calcium 8.3 (*)    Albumin 3.3 (*)    AST 92 (*)    GFR calc non Af Amer 38 (*)    GFR calc Af Amer 44 (*)    All other components within normal limits  CBC WITH DIFFERENTIAL/PLATELET - Abnormal; Notable for the following components:   Platelets 138 (*)    All other components within normal limits  URINALYSIS, ROUTINE W REFLEX MICROSCOPIC - Abnormal; Notable for the following components:   APPearance HAZY (*)    Ketones, ur 5 (*)    Protein, ur 30 (*)    Bacteria, UA RARE (*)    Squamous Epithelial / LPF 0-5 (*)    All other components within normal limits  I-STAT CG4 LACTIC ACID, ED - Abnormal; Notable for the following components:   Lactic Acid, Venous 2.35 (*)    All other components within normal limits  CULTURE, BLOOD (ROUTINE X 2)  CULTURE, BLOOD (ROUTINE X 2)  RESPIRATORY PANEL BY PCR  GRAM STAIN  CULTURE, EXPECTORATED SPUTUM-ASSESSMENT  STREP PNEUMONIAE URINARY ANTIGEN  LACTIC ACID, PLASMA  LACTIC ACID, PLASMA  INFLUENZA PANEL BY PCR (TYPE A & B)  I-STAT TROPONIN, ED  I-STAT CG4 LACTIC ACID, ED    EKG  EKG Interpretation  Date/Time:  Monday May 21 2017 13:21:17 EST Ventricular Rate:  103 PR Interval:  124 QRS Duration: 126 QT Interval:  362 QTC Calculation: 474 R Axis:   95 Text Interpretation:  Sinus tachycardia Right bundle branch block T wave inversion Anterior leads Confirmed by Blanchie Dessert [phone]) on 05/21/2017 2:06:49 PM       Radiology Dg Chest 2 View  Result Date: 05/21/2017 CLINICAL DATA:  Productive cough, shortness of breath, and fever for several days. History of hypertension, coronary artery disease with stent placement, previous MI, COPD, former heavy smoker. EXAM: CHEST  2 VIEW COMPARISON:  Chest x-ray of Aug 09, 2011 FINDINGS: The lungs are well-expanded. The interstitial markings are coarse in the mid and lower  lung zones bilaterally. There is subtle confluent density that  projects inferior laterally in the right lower lung. The heart is normal in size. The pulmonary vascularity is normal. There calcification in the wall of the aortic arch. There is a moderate-sized hiatal hernia. The bony thorax exhibits no acute abnormality. IMPRESSION: Chronic bronchitic changes. Increased interstitial density at both bases with an area of confluence inferior laterally on the right. This may reflect atelectasis or pneumonia. Followup PA and lateral chest X-ray is recommended in 3-4 weeks following trial of antibiotic therapy to ensure resolution and exclude underlying malignancy. Thoracic aortic atherosclerosis. Electronically Signed   By: David  Martinique M.D.   On: 05/21/2017 13:10    Procedures Procedures (including critical care time)  CRITICAL CARE Performed by: Stevie Kern Ainslee Sou   Total critical care time: 35 minutes  Critical care time was exclusive of separately billable procedures and treating other patients.  Critical care was necessary to treat or prevent imminent or life-threatening deterioration.  Critical care was time spent personally by me on the following activities: development of treatment plan with patient and/or surrogate as well as nursing, discussions with consultants, evaluation of patient's response to treatment, examination of patient, obtaining history from patient or surrogate, ordering and performing treatments and interventions, ordering and review of laboratory studies, ordering and review of radiographic studies, pulse oximetry and re-evaluation of patient's condition.  Medications Ordered in ED Medications  aspirin EC tablet 325 mg (not administered)  atorvastatin (LIPITOR) tablet 80 mg (not administered)  pantoprazole (PROTONIX) EC tablet 40 mg (not administered)  enoxaparin (LOVENOX) injection 40 mg (not administered)  0.9 %  sodium chloride infusion ( Intravenous New Bag/Given 05/21/17 1621)  acetaminophen (TYLENOL) tablet 650 mg (not  administered)    Or  acetaminophen (TYLENOL) suppository 650 mg (not administered)  ketorolac (TORADOL) 15 MG/ML injection 15 mg (not administered)  traZODone (DESYREL) tablet 25 mg (not administered)  senna-docusate (Senokot-S) tablet 1 tablet (not administered)  ondansetron (ZOFRAN) tablet 4 mg (not administered)    Or  ondansetron (ZOFRAN) injection 4 mg (not administered)  cefTRIAXone (ROCEPHIN) 1 g in sodium chloride 0.9 % 100 mL IVPB (not administered)  azithromycin (ZITHROMAX) tablet 500 mg (not administered)  insulin aspart (novoLOG) injection 0-9 Units (not administered)  sodium chloride 0.9 % bolus 1,000 mL (0 mLs Intravenous Stopped 05/21/17 1617)  cefTRIAXone (ROCEPHIN) 1 g in sodium chloride 0.9 % 100 mL IVPB (0 g Intravenous Stopped 05/21/17 1548)  azithromycin (ZITHROMAX) 500 mg in sodium chloride 0.9 % 250 mL IVPB (0 mg Intravenous Stopped 05/21/17 1617)  sodium chloride 0.9 % bolus 1,000 mL (0 mLs Intravenous Stopped 05/21/17 1622)     Initial Impression / Assessment and Plan / ED Course  I have reviewed the triage vital signs and the nursing notes.  Pertinent labs & imaging results that were available during my care of the patient were reviewed by me and considered in my medical decision making (see chart for details).     Final Clinical Impressions(s) / ED Diagnoses   Final diagnoses:  Community acquired pneumonia, unspecified laterality    Labs: blood culture, cbc, cmp, I stat lactic acid, I stat trop, respiratory panel by pcr, ua  Imaging: DG Chest 2 view - EKG  Consults: [facility]  Therapeutics: Azithromycin, ceftriaxone, normal saline  Discharge Meds:   Assessment/Plan: 84 year old male presents today with sepsis likely secondary to community acquired pneumonia.  Patient was immediately started on antibiotics, normal saline.  He had no signs of septic  shock.  Patient had relatively clear lung sounds with very minimal crackles.  Patient was evaluated numerous  times throughout his ED stay, he continued to have reassuring vital signs with no significant changes, he appeared to be in no respiratory distress.  Hospitalist service was consulted for admission.    ED Discharge Orders    None       Francee Gentile 05/21/17 Diaz, MD 05/21/17 [phone]

## 2017-05-21 NOTE — ED Notes (Signed)
Admitting NP in with Pt

## 2017-05-21 NOTE — ED Notes (Signed)
placed pt on O2 2L [place]

## 2017-05-21 NOTE — ED Triage Notes (Signed)
Per Pt, Pt is coming from home with complaints of cough, congestion, fevers, chills x 3 days.

## 2017-05-21 NOTE — ED Notes (Signed)
May eat or drink per Dr. Maryan Rued

## 2017-05-21 NOTE — ED Notes (Signed)
Dr Lorin Mercy hospitalist at bedside.  She is aware of pt's sats of 89% RA.  Gave VO to place pt on 2L O2

## 2017-05-21 NOTE — H&P (Signed)
History and Physical    [name]:[id number] DOB: 1930-04-23 DOA: 05/21/2017  PCP: [name], MD Patient coming from:   Chief Complaint: sob  HPI: Kristopher [name] is a very pleasant 84 y.o. male with medical history significant for hypertension, hyperlipidemia, COPD not on home oxygen or inhalers, CAD status post PCI 1998, aortic stenosis, AAA presents to the emergency department the chief complaint worsening shortness of breath cough subjective fever. Initial evaluation reveals acute respiratory failure in the setting of community-acquired pneumonia. [facility] are asked to admit.  Information is obtained from the patient and his significant other who is at the bedside. Reports patient has had a wet sounding but nonproductive cough for a week subjective fever chills and generalized weakness. She also notes he's had decreased oral intake and has been "lying around a lot. Patient reports worsening shortness of breath particularly with exertion. Patient denies headache dizziness syncope or near-syncope. He denies chest pain palpitations lower extremity edema. He does report he has intermittent chronic chest pain. He denies abdominal pain nausea vomiting diarrhea constipation melena bright red blood per rectum. He denies dysuria hematuria frequency or urgency. Significant other reports while they were in the waiting room and he slumped over and appeared nonresponsive.    ED Course: In the emergency department he is alert and oriented afebrile with a blood pressure low end of normal. Provided with IV fluids antibiotics and nebulizers.  Review of Systems: As per HPI otherwise all other systems reviewed and are negative.   Ambulatory Status: Ambulates independently and independent with ADLs no recent falls  Past Medical History:  Diagnosis Date  . AAA (abdominal aortic aneurysm) ([place])    ultrasound 07/2011:  2.7 x 2.8 cm  . Acute myocardial infarction, unspecified site, episode  of care unspecified 1998  . Aortic stenosis    echo 4/12: normal LVF, mild LAE, no significant AS  . CAD (coronary artery disease)    s/p PCI of LAD 1998;  s/p DES to RCA 05/2009;  Myoview 4/13: low risk, no ischemia, EF 56%  . COPD (chronic obstructive pulmonary disease) ([place])   . DJD (degenerative joint disease), lumbar 07/06/2011  . Esophageal reflux   . HLD (hyperlipidemia)   . HTN (hypertension)   . PAD (peripheral artery disease) ([place])    s/p L CIA stent 09/2011 ([place])  . Renal artery stenosis ([facility])    s/p bilat RA stents  . Urine incontinence     Past Surgical History:  Procedure Laterality Date  . CATARACT EXTRACTION W/ INTRAOCULAR LENS  IMPLANT, BILATERAL  YRS AGO  . CORONARY ANGIOPLASTY WITH STENT PLACEMENT    . [place]  . LOWER EXTREMITY ANGIOGRAM Bilateral 09/20/2011   Procedure: LOWER EXTREMITY ANGIOGRAM;  Surgeon: Wellington Hampshire, MD;  Location: [facility];  Service: Cardiovascular;  Laterality: Bilateral;  . PCI of LAD  05/1996   STENT X 4 TOTAL  . TRANSURETHRAL RESECTION OF PROSTATE  08/11/2011   Procedure: TRANSURETHRAL RESECTION OF THE PROSTATE WITH GYRUS INSTRUMENTS;  Surgeon: Fredricka Bonine, MD;  Location: [facility];  Service: Urology;  Laterality: N/A;     . VASECTOMY  1968    Social History   Socioeconomic History  . Marital status: Widowed    Spouse name: Not on file  . Number of children: Not on file  . Years of education: GED  . Highest education level: Not on file  Social Needs  . Financial resource strain: Not on  file  . Food insecurity - worry: Not on file  . Food insecurity - inability: Not on file  . Transportation needs - medical: Not on file  . Transportation needs - non-medical: Not on file  Occupational History  . Occupation: retired  Tobacco Use  . Smoking status: Former Smoker    Packs/day: 3.00    Years: 38.00    Pack years: 114.00    Last attempt to quit: 04/10/1988    Years since quitting: 29.1  . Smokeless  tobacco: Never Used  Substance and Sexual Activity  . Alcohol use: No    Comment: rarely  . Drug use: No  . Sexual activity: Yes  Other Topics Concern  . Not on file  Social History Narrative  . Not on file    Allergies  Allergen Reactions  . Influenza Vaccines Other (See Comments)    unknown  . Sulfa Antibiotics Rash    Family History  Problem Relation Age of Onset  . Heart disease Mother   . Heart disease Father     Prior to Admission medications   Medication Sig Start Date End Date Taking? Authorizing Provider  ALTACE 10 MG capsule TAKE 1 CAPSULE DAILY 04/02/17   Lelon Perla, MD  Ascorbic Acid (VITAMIN C) 500 MG tablet Take 500 mg by mouth 2 (two) times daily.     [provider]  aspirin 325 MG EC tablet Take 325 mg by mouth daily.    [provider]  atorvastatin (LIPITOR) 80 MG tablet Take 1 tablet (80 mg total) by mouth daily. 07/24/16   Lelon Perla, MD  BEE POLLEN PO Take by mouth.    [provider]  Co-Enzyme Q-10 100 MG CAPS Take 2 capsules by mouth daily.     [provider]  cyclobenzaprine (FLEXERIL) 5 MG tablet Take 1 tablet (5 mg total) by mouth at bedtime as needed for muscle spasms. 03/22/17   Bayard Hugger, NP  diclofenac sodium (VOLTAREN) 1 % GEL Apply 2 g topically 4 (four) times daily as needed. 12/26/16   Bayard Hugger, NP  Docusate Calcium (STOOL SOFTENER PO) Take 2 capsules by mouth daily.    [provider]  fish oil-omega-3 fatty acids 1000 MG capsule Take 1 g by mouth daily.     [provider]  HYDROcodone-acetaminophen (NORCO/VICODIN) 5-325 MG tablet Take 1 tablet by mouth 3 (three) times daily before meals. 05/01/17   Bayard Hugger, NP  Multiple Vitamin (MULTIVITAMIN) capsule Take 1 capsule by mouth daily.     [provider]  Multiple Vitamins-Minerals (PRESERVISION AREDS PO) Take 2 tablets by mouth daily.    [provider]  nitroGLYCERIN (NITROSTAT) 0.4  MG SL tablet Place 1 tablet (0.4 mg total) under the tongue every 5 (five) minutes as needed for chest pain. 07/14/14   Lelon Perla, MD  omeprazole (PRILOSEC) 20 MG capsule TAKE 2 CAPSULES DAILY 07/03/16   [name], MD    Physical Exam: Vitals:   05/21/17 1445 05/21/17 1500 05/21/17 1651 05/21/17 1654  BP: 101/62 106/78 (!) 100/53   Pulse: 88 89 (!) 50 83  Resp: (!) 21 (!) 22 18   Temp:      TempSrc:      SpO2: 92% 91% (!) 89% 95%  Weight:      Height:         General:  Appears calm and comfortable in no acute distress, thin and frail appearing Eyes:  PERRL, EOMI, normal lids, iris ENT:  grossly normal hearing, lips & tongue, his membranes are slightly pale Neck:  no LAD, masses or thyromegaly Cardiovascular: , no m/r/g. No LE edema.  Respiratory:  Mild increased work of breathing with conversation. Breath sounds are quite coarse throughout. On crackles bilateral lower bases Abdomen:  soft, ntnd, is a bowel sounds Skin:  no rash or induration seen on limited exam Musculoskeletal:  grossly normal tone BUE/BLE, good ROM, no bony abnormality Psychiatric:  grossly normal mood and affect, speech fluent and appropriate, AOx3 Neurologic:  CN 2-12 grossly intact, moves all extremities in coordinated fashion, sensation intact  Labs on Admission: I have personally reviewed following labs and imaging studies  CBC: Recent Labs  Lab 05/21/17 1408  WBC 5.6  NEUTROABS 3.8  HGB 13.9  HCT 42.1  MCV 92.5  PLT 364*   Basic Metabolic Panel: Recent Labs  Lab 05/21/17 1408  NA 138  K 4.5  CL 104  CO2 21*  GLUCOSE 151*  BUN 31*  CREATININE 1.60*  CALCIUM 8.3*   GFR: Estimated Creatinine Clearance: 30.9 mL/min (A) (by C-G formula based on SCr of 1.6 mg/dL (H)). Liver Function Tests: Recent Labs  Lab 05/21/17 1408  AST 92*  ALT 60  ALKPHOS 77  BILITOT 0.9  PROT 6.5  ALBUMIN 3.3*   No results for input(s): LIPASE, AMYLASE in the last 168 hours. No results for  input(s): AMMONIA in the last 168 hours. Coagulation Profile: No results for input(s): INR, PROTIME in the last 168 hours. Cardiac Enzymes: No results for input(s): CKTOTAL, CKMB, CKMBINDEX, TROPONINI in the last 168 hours. BNP (last 3 results) No results for input(s): PROBNP in the last 8760 hours. HbA1C: No results for input(s): HGBA1C in the last 72 hours. CBG: No results for input(s): GLUCAP in the last 168 hours. Lipid Profile: No results for input(s): CHOL, HDL, LDLCALC, TRIG, CHOLHDL, LDLDIRECT in the last 72 hours. Thyroid Function Tests: No results for input(s): TSH, T4TOTAL, FREET4, T3FREE, THYROIDAB in the last 72 hours. Anemia Panel: No results for input(s): VITAMINB12, FOLATE, FERRITIN, TIBC, IRON, RETICCTPCT in the last 72 hours. Urine analysis:    Component Value Date/Time   COLORURINE YELLOW 05/21/2017 1359   APPEARANCEUR HAZY (A) 05/21/2017 1359   LABSPEC 1.018 05/21/2017 1359   PHURINE 5.0 05/21/2017 1359   GLUCOSEU NEGATIVE 05/21/2017 1359   GLUCOSEU NEGATIVE 01/26/2016 1114   HGBUR NEGATIVE 05/21/2017 1359   BILIRUBINUR NEGATIVE 05/21/2017 1359   KETONESUR 5 (A) 05/21/2017 1359   PROTEINUR 30 (A) 05/21/2017 1359   UROBILINOGEN 0.2 01/26/2016 1114   NITRITE NEGATIVE 05/21/2017 1359   LEUKOCYTESUR NEGATIVE 05/21/2017 1359    Creatinine Clearance: Estimated Creatinine Clearance: 30.9 mL/min (A) (by C-G formula based on SCr of 1.6 mg/dL (H)).  Sepsis Labs: @LABRCNTIP (procalcitonin:4,lacticidven:4) )No results found for this or any previous visit (from the past 240 hour(s)).   Radiological Exams on Admission: Dg Chest 2 View  Result Date: 05/21/2017 CLINICAL DATA:  Productive cough, shortness of breath, and fever for several days. History of hypertension, coronary artery disease with stent placement, previous MI, COPD, former heavy smoker. EXAM: CHEST  2 VIEW COMPARISON:  Chest x-ray of Aug 09, 2011 FINDINGS: The lungs are well-expanded. The interstitial  markings are coarse in the mid and lower lung zones bilaterally. There is subtle confluent density that projects inferior laterally in the right lower lung. The heart is normal in size. The pulmonary vascularity is normal. There calcification in the wall of  the aortic arch. There is a moderate-sized hiatal hernia. The bony thorax exhibits no acute abnormality. IMPRESSION: Chronic bronchitic changes. Increased interstitial density at both bases with an area of confluence inferior laterally on the right. This may reflect atelectasis or pneumonia. Followup PA and lateral chest X-ray is recommended in 3-4 weeks following trial of antibiotic therapy to ensure resolution and exclude underlying malignancy. Thoracic aortic atherosclerosis. Electronically Signed   By: David  Martinique M.D.   On: 05/21/2017 13:10    EKG: Sinus tachycardia Right bundle branch block T wave inversion Anterior leads  Assessment/Plan Principal Problem:   CAP (community acquired pneumonia) Active Problems:   Essential hypertension   CAD S/P LAD PCI 1998, RCA DES 2011   Aortic valve disorder   GERD   COPD (chronic obstructive pulmonary disease) ([facility])   Chronic pain syndrome   Acute kidney injury ([place])   Acute respiratory failure ([place])   Sepsis ([place])   #1. Acute respiratory failure likely related to community-acquired pneumonia in a patient with underlying COPD.  Oxygen saturation level 89% on room air. Chest x-ray reveals chronic bronchitic changes increased interstitial density at both bases inserting for pneumonia. He is provided with IV fluids antibiotics and nebulizer will is oxygen supplementation. At the time of admission oxygen saturation level greater than 90% on 2 L. -Admit -Continue oxygen supplementation -Monitor oxygen saturation level -Nebulizer -Influenza panel -Antibiotics  #2. Community-acquired pneumonia. Chest x-ray as noted above -Antibiotics per protocol -Pneumo urine antigen -legionella urine  antigen -sputum culture as able -See #1  3. Sepsis. Presents with a somewhat soft blood pressure, tachypnea, hypoxia, elevated lactic acid, acute kidney injury. He's provided with IV fluids and antibiotics in the emergency department. Chest x-ray as noted above. Urinalysis with rare bacteria.  -Follow blood cultures -Track lactic acid -Continue IV fluids -Antibiotics as noted above -monitor  #4. Acute kidney injury. Creatinine 1.6. -Hold nephrotoxins -Monitor urine output -IV fluids as noted above -Recheck in the morning  #5. Hypertension. Blood pressure somewhat soft in the emergency department. Home medications include altace  -hold altace -iv fluids as noted above -monitor closely  #6. COPD. Not on home oxygen or nebulizers or inhalers. Top smoking 30 years ago. Chest x-ray as noted above -See #1 -monitor  7. CAD/aortic valve disorder. No chest pain. EKG as noted above. He was seen by cardiology ago recommended continuing aspirin and statin. -continue home meds  DVT prophylaxis: lovenox  Code Status: full  Family Communication: significant other at bedside  Disposition Plan: home  Consults called: none  Admission status: obs    Radene Gunning MD [facility]  If 7PM-7AM, please contact night-coverage [email or web address] Password [facility]  05/21/2017, 5:28 PM

## 2017-05-21 NOTE — ED Notes (Signed)
ED Provider at bedside. 

## 2017-05-21 NOTE — ED Notes (Signed)
Spoke with Wife. Stated that patient was having chest pain in Xray and she had Nitro in her pocket. Gave it to patient without staff knowing. When patient arrived back to the waiting room, pt was lightheaded and altered from original status. Pt was placed on Dinamap to evaluated. Pt moved to room to shoot EKG.

## 2017-05-21 NOTE — ED Notes (Signed)
Pt stated his care here in the ED was good.

## 2017-05-21 NOTE — ED Notes (Signed)
EDP Does not wish to add additional fluids at this time. BP may be related to Nitro taken in lobby. Will continue to monitor.

## 2017-05-22 DIAGNOSIS — N179 Acute kidney failure, unspecified: Secondary | ICD-10-CM

## 2017-05-22 DIAGNOSIS — J181 Lobar pneumonia, unspecified organism: Secondary | ICD-10-CM | POA: Diagnosis not present

## 2017-05-22 DIAGNOSIS — J9601 Acute respiratory failure with hypoxia: Secondary | ICD-10-CM | POA: Diagnosis not present

## 2017-05-22 DIAGNOSIS — J440 Chronic obstructive pulmonary disease with acute lower respiratory infection: Secondary | ICD-10-CM | POA: Diagnosis not present

## 2017-05-22 LAB — BASIC METABOLIC PANEL
Anion gap: 14 (ref 5–15)
BUN: 27 mg/dL — ABNORMAL HIGH (ref 6–20)
CO2: 17 mmol/L — ABNORMAL LOW (ref 22–32)
Calcium: 8 mg/dL — ABNORMAL LOW (ref 8.9–10.3)
Chloride: 110 mmol/L (ref 101–111)
Creatinine, Ser: 1.28 mg/dL — ABNORMAL HIGH (ref 0.61–1.24)
GFR calc Af Amer: 58 mL/min — ABNORMAL LOW (ref >60)
GFR calc non Af Amer: 50 mL/min — ABNORMAL LOW (ref >60)
Glucose, Bld: 171 mg/dL — ABNORMAL HIGH (ref 65–99)
Potassium: 4.1 mmol/L (ref 3.5–5.1)
Sodium: 141 mmol/L (ref 135–145)

## 2017-05-22 LAB — CBC
HCT: 40.4 % (ref 39.0–52.0)
Hemoglobin: 12.9 g/dL — ABNORMAL LOW (ref 13.0–17.0)
MCH: 29.5 pg (ref 26.0–34.0)
MCHC: 31.9 g/dL (ref 30.0–36.0)
MCV: 92.4 fL (ref 78.0–100.0)
Platelets: 121 10*3/uL — ABNORMAL LOW (ref 150–400)
RBC: 4.37 MIL/uL (ref 4.22–5.81)
RDW: 15.1 % (ref 11.5–15.5)
WBC: 5.1 10*3/uL (ref 4.0–10.5)

## 2017-05-22 LAB — GLUCOSE, CAPILLARY
Glucose-Capillary: 160 mg/dL — ABNORMAL HIGH (ref 65–99)
Glucose-Capillary: 126 mg/dL — ABNORMAL HIGH (ref 65–99)

## 2017-05-22 MED ORDER — CEFUROXIME AXETIL 500 MG PO TABS: 500.0000 mg | ORAL_TABLET | Freq: Two times a day (BID) | ORAL | 0 refills | Status: AC

## 2017-05-22 MED ORDER — OSELTAMIVIR PHOSPHATE 30 MG PO CAPS: 30.0000 mg | ORAL_CAPSULE | Freq: Two times a day (BID) | ORAL | 0 refills | Status: AC

## 2017-05-22 MED ORDER — RAMIPRIL 10 MG PO CAPS: 10.0000 mg | ORAL_CAPSULE | Freq: Every day | ORAL | 0 refills | Status: DC

## 2017-05-22 MED ORDER — AZITHROMYCIN 500 MG PO TABS: 500.0000 mg | ORAL_TABLET | ORAL | 0 refills | Status: AC

## 2017-05-22 MED ORDER — PREDNISONE 20 MG PO TABS: 40.0000 mg | ORAL_TABLET | Freq: Every day | ORAL | 0 refills | Status: AC

## 2017-05-22 NOTE — Discharge Summary (Signed)
Physician Discharge Summary  ODAY RIDINGS MRN: [id number] DOB/AGE: June 04, 1930 84 y.o.  PCP: [name], MD   Admit date: 05/21/2017 Discharge date: 05/22/2017  Discharge Diagnoses:    Principal Problem:   CAP (community acquired pneumonia) Active Problems:   Essential hypertension   CAD S/P LAD PCI 1998, RCA DES 2011   Aortic valve disorder   GERD   COPD (chronic obstructive pulmonary disease) ([place])   Chronic pain syndrome   Acute kidney injury ([place])   Acute respiratory failure ([place])   Sepsis ([place])    Follow-up recommendations Follow-up with PCP in 3-5 days , including all  additional recommended appointments as below Follow-up CBC, CMP in 3-5 days Hold ACE until seen by PCP Follow up PA and lateral chest X-ray is recommended in 3-4 weeks following trial of antibiotic therapy to ensure resolution and exclude underlying malignancy recommended .     Allergies as of 05/22/2017      Reactions   Influenza Vaccines Other (See Comments)   unknown   Sulfa Antibiotics Rash      Medication List    TAKE these medications   aspirin 325 MG EC tablet Take 325 mg by mouth daily.   atorvastatin 80 MG tablet Commonly known as:  LIPITOR Take 1 tablet (80 mg total) by mouth daily.   azithromycin 500 MG tablet Commonly known as:  ZITHROMAX Take 1 tablet (500 mg total) by mouth daily for 5 days.   BEE POLLEN PO Take by mouth.   cefUROXime 500 MG tablet Commonly known as:  CEFTIN Take 1 tablet (500 mg total) by mouth 2 (two) times daily for 10 days.   Co-Enzyme Q-10 100 MG Caps Take 2 capsules by mouth daily.   cyclobenzaprine 5 MG tablet Commonly known as:  FLEXERIL Take 1 tablet (5 mg total) by mouth at bedtime as needed for muscle spasms.   diclofenac sodium 1 % Gel Commonly known as:  VOLTAREN Apply 2 g topically 4 (four) times daily as needed.   fish oil-omega-3 fatty acids 1000 MG capsule Take 1 g by mouth daily.   HYDROcodone-acetaminophen 5-325 MG  tablet Commonly known as:  NORCO/VICODIN Take 1 tablet by mouth 3 (three) times daily before meals.   multivitamin capsule Take 1 capsule by mouth daily.   nitroGLYCERIN 0.4 MG SL tablet Commonly known as:  NITROSTAT Place 1 tablet (0.4 mg total) under the tongue every 5 (five) minutes as needed for chest pain.   omeprazole 20 MG capsule Commonly known as:  PRILOSEC TAKE 2 CAPSULES DAILY   oseltamivir 30 MG capsule Commonly known as:  TAMIFLU Take 1 capsule (30 mg total) by mouth 2 (two) times daily for 5 days.   predniSONE 20 MG tablet Commonly known as:  DELTASONE Take 2 tablets (40 mg total) by mouth daily with breakfast for 5 days. Start taking on:  05/23/2017   PRESERVISION AREDS PO Take 2 tablets by mouth daily.   ramipril 10 MG capsule Commonly known as:  ALTACE Take 1 capsule (10 mg total) by mouth daily. Start taking on:  05/29/2017 What changed:    how much to take  These instructions start on 05/29/2017. If you are unsure what to do until then, ask your doctor or other care provider.   STOOL SOFTENER PO Take 2 capsules by mouth daily.   vitamin C 500 MG tablet Commonly known as:  ASCORBIC ACID Take 500 mg by mouth 2 (two) times daily.  Discharge Condition stable  Discharge Instructions Get Medicines reviewed and adjusted: Please take all your medications with you for your next visit with your Primary MD  Please request your Primary MD to go over all hospital tests and procedure/radiological results at the follow up, please ask your Primary MD to get all Hospital records sent to his/her office.  If you experience worsening of your admission symptoms, develop shortness of breath, life threatening emergency, suicidal or homicidal thoughts you must seek medical attention immediately by calling 911 or calling your MD immediately if symptoms less severe.  You must read complete instructions/literature along with all the possible adverse  reactions/side effects for all the Medicines you take and that have been prescribed to you. Take any new Medicines after you have completely understood and accpet all the possible adverse reactions/side effects.   Do not drive when taking Pain medications.   Do not take more than prescribed Pain, Sleep and Anxiety Medications  Special Instructions: If you have smoked or chewed Tobacco in the last 2 yrs please stop smoking, stop any regular Alcohol and or any Recreational drug use.  Wear Seat belts while driving.  Please note  You were cared for by a hospitalist during your hospital stay. Once you are discharged, your primary care physician will handle any further medical issues. Please note that NO REFILLS for any discharge medications will be authorized once you are discharged, as it is imperative that you return to your primary care physician (or establish a relationship with a primary care physician if you do not have one) for your aftercare needs so that they can reassess your need for medications and monitor your lab values.     Allergies  Allergen Reactions  . Influenza Vaccines Other (See Comments)    unknown  . Sulfa Antibiotics Rash      Disposition: 01-Home or Self Care   Consults:   None     Significant Diagnostic Studies:  Dg Chest 2 View  Result Date: 05/21/2017 CLINICAL DATA:  Productive cough, shortness of breath, and fever for several days. History of hypertension, coronary artery disease with stent placement, previous MI, COPD, former heavy smoker. EXAM: CHEST  2 VIEW COMPARISON:  Chest x-ray of Aug 09, 2011 FINDINGS: The lungs are well-expanded. The interstitial markings are coarse in the mid and lower lung zones bilaterally. There is subtle confluent density that projects inferior laterally in the right lower lung. The heart is normal in size. The pulmonary vascularity is normal. There calcification in the wall of the aortic arch. There is a moderate-sized  hiatal hernia. The bony thorax exhibits no acute abnormality. IMPRESSION: Chronic bronchitic changes. Increased interstitial density at both bases with an area of confluence inferior laterally on the right. This may reflect atelectasis or pneumonia. Followup PA and lateral chest X-ray is recommended in 3-4 weeks following trial of antibiotic therapy to ensure resolution and exclude underlying malignancy. Thoracic aortic atherosclerosis. Electronically Signed   By: David  Martinique M.D.   On: 05/21/2017 13:10       Filed Weights   05/21/17 1235 05/21/17 2052  Weight: 63.5 kg (140 lb) 63.3 kg (139 lb 8.8 oz)     Microbiology: Recent Results (from the past 240 hour(s))  Blood Culture (routine x 2)     Status: None (Preliminary result)   Collection Time: 05/21/17  1:55 PM  Result Value Ref Range Status   Specimen Description BLOOD RIGHT FOREARM  Final   Special Requests   Final  BOTTLES DRAWN AEROBIC AND ANAEROBIC Blood Culture adequate volume   Culture   Final    NO GROWTH < 24 HOURS Performed at [facility], [place] [address], [place], [place] [address]    Report Status PENDING  Incomplete  Blood Culture (routine x 2)     Status: None (Preliminary result)   Collection Time: 05/21/17  2:02 PM  Result Value Ref Range Status   Specimen Description BLOOD RIGHT WRIST  Final   Special Requests   Final    BOTTLES DRAWN AEROBIC AND ANAEROBIC Blood Culture adequate volume   Culture   Final    NO GROWTH < 24 HOURS Performed at [facility], [place] [address] [place], [place] [address]    Report Status PENDING  Incomplete  Respiratory Panel by PCR     Status: Abnormal   Collection Time: 05/21/17  2:06 PM  Result Value Ref Range Status   Adenovirus NOT DETECTED NOT DETECTED Final   Coronavirus 229E NOT DETECTED NOT DETECTED Final   Coronavirus HKU1 NOT DETECTED NOT DETECTED Final   Coronavirus NL63 NOT DETECTED NOT DETECTED Final   Coronavirus OC43 NOT DETECTED NOT DETECTED Final    Metapneumovirus NOT DETECTED NOT DETECTED Final   Rhinovirus / Enterovirus NOT DETECTED NOT DETECTED Final   Influenza A NOT DETECTED NOT DETECTED Final   Influenza A H1 NOT DETECTED NOT DETECTED Final   Influenza A H1 2009 DETECTED (A) NOT DETECTED Final   Influenza A H3 NOT DETECTED NOT DETECTED Final   Influenza B NOT DETECTED NOT DETECTED Final   Parainfluenza Virus 1 NOT DETECTED NOT DETECTED Final   Parainfluenza Virus 2 NOT DETECTED NOT DETECTED Final   Parainfluenza Virus 3 NOT DETECTED NOT DETECTED Final    Comment: NOT DETECTED   Parainfluenza Virus 4 NOT DETECTED NOT DETECTED Final   Respiratory Syncytial Virus NOT DETECTED NOT DETECTED Final   Bordetella pertussis NOT DETECTED NOT DETECTED Final   Chlamydophila pneumoniae NOT DETECTED NOT DETECTED Final   Mycoplasma pneumoniae NOT DETECTED NOT DETECTED Final    Comment: Performed at [facility], [place] [address] [place], [place] [address]       Blood Culture    Component Value Date/Time   SDES BLOOD RIGHT WRIST 05/21/2017 1402   SPECREQUEST  05/21/2017 1402    BOTTLES DRAWN AEROBIC AND ANAEROBIC Blood Culture adequate volume   CULT  05/21/2017 1402    NO GROWTH < 24 HOURS Performed at [facility] [address], [place], [place] [address]    REPTSTATUS PENDING 05/21/2017 1402      Labs: Results for orders placed or performed during the hospital encounter of 05/21/17 (from the past 48 hour(s))  Blood Culture (routine x 2)     Status: None (Preliminary result)   Collection Time: 05/21/17  1:55 PM  Result Value Ref Range   Specimen Description BLOOD RIGHT FOREARM    Special Requests      BOTTLES DRAWN AEROBIC AND ANAEROBIC Blood Culture adequate volume   Culture      NO GROWTH < 24 HOURS Performed at [facility] [address], [place], [place] [address]    Report Status PENDING   Urinalysis, Routine w reflex microscopic     Status: Abnormal   Collection Time: 05/21/17  1:59 PM   Result Value Ref Range   Color, Urine YELLOW YELLOW   APPearance HAZY (A) CLEAR   Specific Gravity, Urine 1.018 1.005 -  1.030   pH 5.0 5.0 - 8.0   Glucose, UA NEGATIVE NEGATIVE mg/dL   Hgb urine dipstick NEGATIVE NEGATIVE   Bilirubin Urine NEGATIVE NEGATIVE   Ketones, ur 5 (A) NEGATIVE mg/dL   Protein, ur 30 (A) NEGATIVE mg/dL   Nitrite NEGATIVE NEGATIVE   Leukocytes, UA NEGATIVE NEGATIVE   RBC / HPF 0-5 0 - 5 RBC/hpf   WBC, UA 0-5 0 - 5 WBC/hpf   Bacteria, UA RARE (A) NONE SEEN   Squamous Epithelial / LPF 0-5 (A) NONE SEEN   Mucus PRESENT    Hyaline Casts, UA PRESENT     Comment: Performed at [facility] [address] [place], [place] [address]  Blood Culture (routine x 2)     Status: None (Preliminary result)   Collection Time: 05/21/17  2:02 PM  Result Value Ref Range   Specimen Description BLOOD RIGHT WRIST    Special Requests      BOTTLES DRAWN AEROBIC AND ANAEROBIC Blood Culture adequate volume   Culture      NO GROWTH < 24 HOURS Performed at [facility], [place] [address] [place], [place] [address]    Report Status PENDING   Respiratory Panel by PCR     Status: Abnormal   Collection Time: 05/21/17  2:06 PM  Result Value Ref Range   Adenovirus NOT DETECTED NOT DETECTED   Coronavirus 229E NOT DETECTED NOT DETECTED   Coronavirus HKU1 NOT DETECTED NOT DETECTED   Coronavirus NL63 NOT DETECTED NOT DETECTED   Coronavirus OC43 NOT DETECTED NOT DETECTED   Metapneumovirus NOT DETECTED NOT DETECTED   Rhinovirus / Enterovirus NOT DETECTED NOT DETECTED   Influenza A NOT DETECTED NOT DETECTED   Influenza A H1 NOT DETECTED NOT DETECTED   Influenza A H1 2009 DETECTED (A) NOT DETECTED   Influenza A H3 NOT DETECTED NOT DETECTED   Influenza B NOT DETECTED NOT DETECTED   Parainfluenza Virus 1 NOT DETECTED NOT DETECTED   Parainfluenza Virus 2 NOT DETECTED NOT DETECTED   Parainfluenza Virus 3 NOT DETECTED NOT DETECTED    Comment: NOT DETECTED   Parainfluenza Virus 4  NOT DETECTED NOT DETECTED   Respiratory Syncytial Virus NOT DETECTED NOT DETECTED   Bordetella pertussis NOT DETECTED NOT DETECTED   Chlamydophila pneumoniae NOT DETECTED NOT DETECTED   Mycoplasma pneumoniae NOT DETECTED NOT DETECTED    Comment: Performed at [facility], [place] [address] [place], [place] [address]  Comprehensive metabolic panel     Status: Abnormal   Collection Time: 05/21/17  2:08 PM  Result Value Ref Range   Sodium 138 135 - 145 mmol/L   Potassium 4.5 3.5 - 5.1 mmol/L   Chloride 104 101 - 111 mmol/L   CO2 21 (L) 22 - 32 mmol/L   Glucose, Bld 151 (H) 65 - 99 mg/dL   BUN 31 (H) 6 - 20 mg/dL   Creatinine, Ser 1.60 (H) 0.61 - 1.24 mg/dL   Calcium 8.3 (L) 8.9 - 10.3 mg/dL   Total Protein 6.5 6.5 - 8.1 g/dL   Albumin 3.3 (L) 3.5 - 5.0 g/dL   AST 92 (H) 15 - 41 U/L   ALT 60 17 - 63 U/L   Alkaline Phosphatase 77 38 - 126 U/L   Total Bilirubin 0.9 0.3 - 1.2 mg/dL   GFR calc non Af Amer 38 (L) >60 mL/min   GFR calc Af Amer 44 (L) >60 mL/min    Comment: (NOTE) The eGFR has been  calculated using the CKD EPI equation. This calculation has not been validated in all clinical situations. eGFR's persistently <60 mL/min signify possible Chronic Kidney Disease.    Anion gap 13 5 - 15    Comment: Performed at [facility] [address] [place], [place] [address]  CBC WITH DIFFERENTIAL     Status: Abnormal   Collection Time: 05/21/17  2:08 PM  Result Value Ref Range   WBC 5.6 4.0 - 10.5 K/uL   RBC 4.55 4.22 - 5.81 MIL/uL   Hemoglobin 13.9 13.0 - 17.0 g/dL   HCT 42.1 39.0 - 52.0 %   MCV 92.5 78.0 - 100.0 fL   MCH 30.5 26.0 - 34.0 pg   MCHC 33.0 30.0 - 36.0 g/dL   RDW 15.1 11.5 - 15.5 %   Platelets 138 (L) 150 - 400 K/uL   Neutrophils Relative % 69 %   Neutro Abs 3.8 1.7 - 7.7 K/uL   Lymphocytes Relative 23 %   Lymphs Abs 1.3 0.7 - 4.0 K/uL   Monocytes Relative 8 %   Monocytes Absolute 0.5 0.1 - 1.0 K/uL   Eosinophils Relative 0 %   Eosinophils Absolute  0.0 0.0 - 0.7 K/uL   Basophils Relative 0 %   Basophils Absolute 0.0 0.0 - 0.1 K/uL    Comment: Performed at [facility] [address], [place], [place] [address]  I-stat troponin, ED     Status: None   Collection Time: 05/21/17  2:14 PM  Result Value Ref Range   Troponin i, poc 0.04 0.00 - 0.08 ng/mL   Comment 3            Comment: Due to the release kinetics of cTnI, a negative result within the first hours of the onset of symptoms does not rule out myocardial infarction with certainty. If myocardial infarction is still suspected, repeat the test at appropriate intervals.   I-Stat CG4 Lactic Acid, ED  (not at  [facility])     Status: Abnormal   Collection Time: 05/21/17  2:17 PM  Result Value Ref Range   Lactic Acid, Venous 2.35 (HH) 0.5 - 1.9 mmol/L   Comment NOTIFIED PHYSICIAN   Influenza panel by PCR (type A & B)     Status: Abnormal   Collection Time: 05/21/17  5:18 PM  Result Value Ref Range   Influenza A By PCR POSITIVE (A) NEGATIVE   Influenza B By PCR NEGATIVE NEGATIVE    Comment: (NOTE) The Xpert Xpress Flu assay is intended as an aid in the diagnosis of  influenza and should not be used as a sole basis for treatment.  This  assay is FDA approved for nasopharyngeal swab specimens only. Nasal  washings and aspirates are unacceptable for Xpert Xpress Flu testing.   Lactic acid, plasma     Status: Abnormal   Collection Time: 05/21/17  5:29 PM  Result Value Ref Range   Lactic Acid, Venous 2.2 (HH) 0.5 - 1.9 mmol/L    Comment: CRITICAL RESULT CALLED TO, READ BACK BY AND VERIFIED WITH: Helmut Muster RN [phone] 1841 GREEN R Performed at [facility], [place] [address] [place], [place] [address]   Glucose, capillary     Status: Abnormal   Collection Time: 05/21/17  5:31 PM  Result Value Ref Range   Glucose-Capillary 107 (H) 65 - 99 mg/dL  Procalcitonin - Baseline     Status: None   Collection Time: 05/21/17  6:50 PM  Result Value  Ref Range   Procalcitonin <0.10 ng/mL     Comment:        Interpretation: PCT (Procalcitonin) <= 0.5 ng/mL: Systemic infection (sepsis) is not likely. Local bacterial infection is possible. (NOTE)       Sepsis PCT Algorithm           Lower Respiratory Tract                                      Infection PCT Algorithm    ----------------------------     ----------------------------         PCT < 0.25 ng/mL                PCT < 0.10 ng/mL         Strongly encourage             Strongly discourage   discontinuation of antibiotics    initiation of antibiotics    ----------------------------     -----------------------------       PCT 0.25 - 0.50 ng/mL            PCT 0.10 - 0.25 ng/mL               OR       >80% decrease in PCT            Discourage initiation of                                            antibiotics      Encourage discontinuation           of antibiotics    ----------------------------     -----------------------------         PCT >= 0.50 ng/mL              PCT 0.26 - 0.50 ng/mL               AND        <80% decrease in PCT             Encourage initiation of                                             antibiotics       Encourage continuation           of antibiotics    ----------------------------     -----------------------------        PCT >= 0.50 ng/mL                  PCT > 0.50 ng/mL               AND         increase in PCT                  Strongly encourage                                      initiation of antibiotics    Strongly encourage escalation           of  antibiotics                                     -----------------------------                                           PCT <= 0.25 ng/mL                                                 OR                                        > 80% decrease in PCT                                     Discontinue / Do not initiate                                             antibiotics Performed at [facility], [place] [address],  [place], [place] [address]   Glucose, capillary     Status: Abnormal   Collection Time: 05/21/17  8:51 PM  Result Value Ref Range   Glucose-Capillary 137 (H) 65 - 99 mg/dL  Lactic acid, plasma     Status: None   Collection Time: 05/21/17  9:05 PM  Result Value Ref Range   Lactic Acid, Venous 1.5 0.5 - 1.9 mmol/L    Comment: Performed at [facility] [address], [place], [place] [address]  Basic metabolic panel     Status: Abnormal   Collection Time: 05/22/17  6:06 AM  Result Value Ref Range   Sodium 141 135 - 145 mmol/L   Potassium 4.1 3.5 - 5.1 mmol/L   Chloride 110 101 - 111 mmol/L   CO2 17 (L) 22 - 32 mmol/L   Glucose, Bld 171 (H) 65 - 99 mg/dL   BUN 27 (H) 6 - 20 mg/dL   Creatinine, Ser 1.28 (H) 0.61 - 1.24 mg/dL   Calcium 8.0 (L) 8.9 - 10.3 mg/dL   GFR calc non Af Amer 50 (L) >60 mL/min   GFR calc Af Amer 58 (L) >60 mL/min    Comment: (NOTE) The eGFR has been calculated using the CKD EPI equation. This calculation has not been validated in all clinical situations. eGFR's persistently <60 mL/min signify possible Chronic Kidney Disease.    Anion gap 14 5 - 15    Comment: Performed at [facility] [address], [place] [address]  CBC     Status: Abnormal   Collection Time: 05/22/17  6:06 AM  Result Value Ref Range   WBC 5.1 4.0 - 10.5 K/uL   RBC 4.37 4.22 - 5.81 MIL/uL   Hemoglobin 12.9 (L) 13.0 - 17.0 g/dL   HCT 40.4 39.0 - 52.0 %   MCV 92.4 78.0 - 100.0 fL   MCH 29.5  26.0 - 34.0 pg   MCHC 31.9 30.0 - 36.0 g/dL   RDW 15.1 11.5 - 15.5 %   Platelets 121 (L) 150 - 400 K/uL    Comment: Performed at [facility], [place] [address] [place], [place] [address]  Glucose, capillary     Status: Abnormal   Collection Time: 05/22/17  7:19 AM  Result Value Ref Range   Glucose-Capillary 160 (H) 65 - 99 mg/dL  Glucose, capillary     Status: Abnormal   Collection Time: 05/22/17 11:27 AM  Result Value Ref Range   Glucose-Capillary 126 (H) 65 - 99 mg/dL      Lipid Panel     Component Value Date/Time   CHOL 136 01/26/2016 1114   TRIG 69.0 01/26/2016 1114   HDL 47.20 01/26/2016 1114   CHOLHDL 3 01/26/2016 1114   VLDL 13.8 01/26/2016 1114   LDLCALC 75 01/26/2016 1114     No results found for: HGBA1C   Lab Results  Component Value Date   LDLCALC 75 01/26/2016   CREATININE 1.28 (H) 05/22/2017     HPI :  Kristopher [name] is a very pleasant 84 y.o. male with medical history significant for hypertension, hyperlipidemia, COPD not on home oxygen or inhalers, CAD status post PCI 1998, aortic stenosis, AAA presents to the emergency department the chief complaint worsening shortness of breath cough subjective fever. Initial evaluation reveals acute respiratory failure in the setting of community-acquired pneumonia. [facility] hospitalists are asked to admit.  Information is obtained from the patient and his significant other who is at the bedside. Reports patient has had a wet sounding but nonproductive cough for a week subjective fever chills and generalized weakness. She also notes he's had decreased oral intake and has been "lying around a lot. Patient reports worsening shortness of breath particularly with exertion. Patient denies headache dizziness syncope or near-syncope. He denies chest pain palpitations lower extremity edema. He does report he has intermittent chronic chest pain. He denies abdominal pain nausea vomiting diarrhea constipation melena bright red blood per rectum. He denies dysuria hematuria frequency or urgency. Significant other reports while they were in the waiting room and he slumped over and appeared nonresponsive.    ED Course: In the emergency department he is alert and oriented afebrile with a blood pressure low end of normal. Provided with IV fluids antibiotics and nebulizers.    HOSPITAL COURSE:      #1. Acute respiratory failure likely related to community-acquired pneumonia / influenza A in a patient with  underlying COPD.  Oxygen saturation level 89% on room air. Chest x-ray reveals chronic bronchitic changes increased interstitial density at both bases inserting for pneumonia. He was provided with IV fluids antibiotics and nebulizer , oxygen supplementation. -Influenza panel positive for influenza A  -Antibiotics omniceg and azithro  As well as prednisone for another 5 days     2. Sepsis. Presents with a somewhat soft blood pressure, tachypnea, hypoxia, elevated lactic acid, acute kidney injury. He's provided with IV fluids and antibiotics in the emergency department. Chest x-ray as noted above. Urinalysis with rare bacteria.  -Follow blood cultures, ngsf -sepsis physiology resolved prior to dc   #3. Acute kidney injury. Creatinine 1.6. -Held nephrotoxins, ACE -cr 1.28 prior to DC   #4. Hypertension. Blood pressure somewhat soft in the emergency department. Home medications include altace  -held altace -iv fluids as noted above    #5. COPD. Not on home oxygen or nebulizers or inhalers. Top smoking 30  years ago. Chest x-ray as noted above -See #1    6. CAD/aortic valve disorder. No chest pain. EKG as noted above. He was seen by cardiology ago recommended continuing aspirin and statin. -continue home meds    Discharge Exam:   Blood pressure 122/68, pulse 69, temperature 98.4 F (36.9 C), temperature source Oral, resp. rate 18, height '5\' 9"'  (1.753 m), weight 63.3 kg (139 lb 8.8 oz), SpO2 96 %.    General:  Appears calm and comfortable in no acute distress, thin and frail appearing  Eyes:  PERRL, EOMI, normal lids, iris  ENT:  grossly normal hearing, lips & tongue, his membranes are slightly pale  Neck:  no LAD, masses or thyromegaly  Cardiovascular: , no m/r/g. No LE edema.   Respiratory:  Mild increased work of breathing with conversation. Breath sounds are quite coarse throughout. On crackles bilateral lower bases  Abdomen:  soft, ntnd, is a bowel sounds  Skin:  no  rash or induration seen on limited exam  Musculoskeletal:  grossly normal tone BUE/BLE, good ROM, no bony abnormality      Follow-up Information    [name], MD. Call.   Specialties:  Internal Medicine, Radiology Why:  for follow up appointment in 3-5 days  Contact information: [place] [address] [phone]           Signed: Reyne Dumas 05/22/2017, 1:58 PM        Time spent >1 hour

## 2017-05-22 NOTE — Evaluation (Signed)
Physical Therapy Evaluation Patient Details Name: Kristopher [name] MRN: [id number] DOB: 06/24/1930 Today's Date: 05/22/2017   History of Present Illness  84 y.o. male with a Past Medical History of RAS s/p B RA stents; PAD; HTN; HLD; COPD; CAD s/p PCI; and AAA who presents with cough and SOB  Clinical Impression  Orders received for PT evaluation. Patient demonstrates modest deficits in functional mobility as indicated below but overall mobilizing well. AT this time, encourage continued mobility during hospital course but do not feel skilled PT is needed. Patient ambulating, performed stairs without issue and saturations we stable. No further acute needs will sign off.     Follow Up Recommendations No PT follow up;Supervision - Intermittent    Equipment Recommendations  None recommended by PT    Recommendations for Other Services       Precautions / Restrictions Restrictions Weight Bearing Restrictions: No      Mobility  Bed Mobility Overal bed mobility: Independent             General bed mobility comments: no difficulty   Transfers Overall transfer level: Modified independent Equipment used: Straight cane             General transfer comment: no difficulty with transfers  Ambulation/Gait Ambulation/Gait assistance: Modified independent (Device/Increase time) Ambulation Distance (Feet): 180 Feet Assistive device: Straight cane Gait Pattern/deviations: WFL(Within Functional Limits) Gait velocity: decreased   General Gait Details: modest instability noted but able to self correct without difficulty  Stairs Stairs: Yes Stairs assistance: Supervision Stair Management: Alternating pattern;One rail Left Number of Stairs: 6 General stair comments: no difficulty, supervision for line management  Wheelchair Mobility    Modified Rankin (Stroke Patients Only)       Balance Overall balance assessment: No apparent balance deficits (not formally assessed)                                            Pertinent Vitals/Pain Pain Assessment: No/denies pain    Home Living Family/patient expects to be discharged to:: Private residence Living Arrangements: Spouse/significant other(girlfriend) Available Help at Discharge: Family Type of Home: House Home Access: Level entry     Home Layout: Multi-level Home Equipment: Cane - single point      Prior Function Level of Independence: Independent with assistive device(s)         Comments: uses cane for mobility, highly independent     Hand Dominance   Dominant Hand: Right    Extremity/Trunk Assessment   Upper Extremity Assessment Upper Extremity Assessment: Overall WFL for tasks assessed    Lower Extremity Assessment Lower Extremity Assessment: Overall WFL for tasks assessed       Communication   Communication: No difficulties  Cognition Arousal/Alertness: Awake/alert Behavior During Therapy: WFL for tasks assessed/performed Overall Cognitive Status: Within Functional Limits for tasks assessed                                        General Comments      Exercises     Assessment/Plan    PT Assessment Patent does not need any further PT services  PT Problem List         PT Treatment Interventions      PT Goals (Current goals can be found in the Care  Plan section)  Acute Rehab PT Goals PT Goal Formulation: All assessment and education complete, DC therapy    Frequency     Barriers to discharge        Co-evaluation               AM-PAC PT "6 Clicks" Daily Activity  Outcome Measure Difficulty turning over in bed (including adjusting bedclothes, sheets and blankets)?: None Difficulty moving from lying on back to sitting on the side of the bed? : None Difficulty sitting down on and standing up from a chair with arms (e.g., wheelchair, bedside commode, etc,.)?: None Help needed moving to and from a bed to chair (including a  wheelchair)?: None Help needed walking in hospital room?: None Help needed climbing 3-5 steps with a railing? : A Little 6 Click Score: 23    End of Session Equipment Utilized During Treatment: Gait belt Activity Tolerance: Patient tolerated treatment well Patient left: in bed;with call bell/phone within reach Nurse Communication: Mobility status PT Visit Diagnosis: Difficulty in walking, not elsewhere classified (R26.2)    Time: 5456-2563 PT Time Calculation (min) (ACUTE ONLY): 16 min   Charges:   PT Evaluation $PT Eval Moderate Complexity: 1 Mod     PT G Codes:        Alben Deeds, PT DPT  Board Certified Neurologic Specialist [place] 05/22/2017, 10:06 AM

## 2017-05-22 NOTE — Progress Notes (Signed)
SATURATION QUALIFICATIONS: (This note is used to comply with regulatory documentation for home oxygen)  Patient Saturations on Room Air at Rest = 96%  Patient Saturations on Room Air while Ambulating = 93%  Pt didn't de-sat during ambulation.

## 2017-05-22 NOTE — Care Management Note (Addendum)
Case Management Note  Patient Details  Name: Kristopher [name] MRN: [id number] Date of Birth: [birth year]  Subjective/Objective:      Admitted for Acute respiratory failure likely related to community-acquired pneumonia   Action/Plan: Prior to admission lived at home with spouse.  PCP noted.  Home DME: cane-single point.  Patient with discharge orders for today.  No discharge needs noted.  Expected Discharge Date:    05/22/2017              Expected Discharge Plan:  Home/Self Care  Discharge planning Services  CM Consult  Status of Service:Completed, signed off  Kristen Cardinal, RN  Nurse case manager [phone] 05/22/2017, 1:14 PM

## 2017-05-22 NOTE — Progress Notes (Signed)
Pt discharged to home. Discharge instructions discussed and reviewed with pt,k verbalized understanding. Copy of AVS given as sell. Pt was escorted out of the unit in wheelchair,k too,k all belongings with him.

## 2017-05-22 NOTE — Care Management Obs Status (Signed)
[facility] NOTIFICATION   Patient Details  Name: Kristopher [name] MRN: [id number] Date of Birth: Oct 09, 1930   Medicare Observation Status Notification Given:  Yes    Carles Collet, RN 05/22/2017, 11:17 AM

## 2017-05-23 ENCOUNTER — Telehealth: Payer: Self-pay | Admitting: *Deleted

## 2017-05-23 NOTE — Telephone Encounter (Signed)
Transition Care Management Follow-up Telephone Call   Date discharged? 05/22/17   How have you been since you were released from the hospital? Called pt spoke w/wife (dorothy) she states husband is doing alright   Do you understand why you were in the hospital? YES   Do you understand the discharge instructions? YES   Where were you discharged to? Home   Items Reviewed:  Medications reviewed: YES  Allergies reviewed: YES  Dietary changes reviewed: YES, she states not eating a whole lot  Referrals reviewed: No referral needed   Functional Questionnaire:   Activities of Daily Living (ADLs):   She states she are independent in the following: bathing and hygiene, feeding, continence, grooming, toileting and dressing States he require assistance with the following: ambulation   Any transportation issues/concerns?: NO   Any patient concerns? NO   Confirmed importance and date/time of follow-up visits scheduled YES, APPT 05/31/17  Provider Appointment booked with DR. JOHN  Confirmed with patient if condition begins to worsen call PCP or go to the ER.  Patient was given the office number and encouraged to call back with question or concerns.  : YES

## 2017-05-23 NOTE — Consult Note (Signed)
            [facility] CM Primary Care Navigator  05/23/2017  Kristopher [name] [birth year] [id number]   Went to seepatient at the bedsideto identify possible discharge needs but he was alreadydischargedhome per staff report.  Patient was discharged home yesterday.  Primary care provider's officeis listed as providing transition of care (TOC).  Patient has discharge instruction to follow-up with primary care provider in 3-5 days after discharge.   For additional questions please contact:  Edwena Felty A. Desyre Calma, BSN, RN-BC [facility] PRIMARY CARE Navigator Cell: [phone]

## 2017-05-26 LAB — CULTURE, BLOOD (ROUTINE X 2)
Culture: NO GROWTH
Culture: NO GROWTH
Special Requests: ADEQUATE
Special Requests: ADEQUATE

## 2017-05-31 ENCOUNTER — Encounter: Payer: Self-pay | Admitting: Internal Medicine

## 2017-05-31 ENCOUNTER — Other Ambulatory Visit (INDEPENDENT_AMBULATORY_CARE_PROVIDER_SITE_OTHER): Payer: Medicare Other

## 2017-05-31 ENCOUNTER — Ambulatory Visit (INDEPENDENT_AMBULATORY_CARE_PROVIDER_SITE_OTHER): Payer: Medicare Other | Admitting: Internal Medicine

## 2017-05-31 VITALS — BP 104/62 | HR 75 | Temp 97.8°F | Ht 69.0 in | Wt 143.0 lb

## 2017-05-31 DIAGNOSIS — J181 Lobar pneumonia, unspecified organism: Secondary | ICD-10-CM | POA: Diagnosis not present

## 2017-05-31 DIAGNOSIS — J189 Pneumonia, unspecified organism: Secondary | ICD-10-CM

## 2017-05-31 DIAGNOSIS — N179 Acute kidney failure, unspecified: Secondary | ICD-10-CM

## 2017-05-31 DIAGNOSIS — Z8709 Personal history of other diseases of the respiratory system: Secondary | ICD-10-CM | POA: Diagnosis not present

## 2017-05-31 DIAGNOSIS — J9601 Acute respiratory failure with hypoxia: Secondary | ICD-10-CM | POA: Diagnosis not present

## 2017-05-31 LAB — CBC WITH DIFFERENTIAL/PLATELET
Basophils Absolute: 0.1 10*3/uL (ref 0.0–0.1)
Basophils Relative: 0.4 % (ref 0.0–3.0)
Eosinophils Absolute: 0.2 10*3/uL (ref 0.0–0.7)
Eosinophils Relative: 1.3 % (ref 0.0–5.0)
HCT: 40.1 % (ref 39.0–52.0)
Hemoglobin: 13.3 g/dL (ref 13.0–17.0)
Lymphocytes Relative: 20.7 % (ref 12.0–46.0)
Lymphs Abs: 2.7 10*3/uL (ref 0.7–4.0)
MCHC: 33.3 g/dL (ref 30.0–36.0)
MCV: 91.2 fl (ref 78.0–100.0)
Monocytes Absolute: 1.1 10*3/uL — ABNORMAL HIGH (ref 0.1–1.0)
Monocytes Relative: 8.2 % (ref 3.0–12.0)
Neutro Abs: 8.9 10*3/uL — ABNORMAL HIGH (ref 1.4–7.7)
Neutrophils Relative %: 69.4 % (ref 43.0–77.0)
Platelets: 397 10*3/uL (ref 150.0–400.0)
RBC: 4.4 Mil/uL (ref 4.22–5.81)
RDW: 15.2 % (ref 11.5–15.5)
WBC: 12.9 10*3/uL — ABNORMAL HIGH (ref 4.0–10.5)

## 2017-05-31 LAB — BASIC METABOLIC PANEL
BUN: 25 mg/dL — ABNORMAL HIGH (ref 6–23)
CO2: 28 mEq/L (ref 19–32)
Calcium: 9 mg/dL (ref 8.4–10.5)
Chloride: 109 mEq/L (ref 96–112)
Creatinine, Ser: 1.26 mg/dL (ref 0.40–1.50)
GFR: 57.85 mL/min — ABNORMAL LOW (ref >60.00)
Glucose, Bld: 110 mg/dL — ABNORMAL HIGH (ref 70–99)
Potassium: 5.3 mEq/L — ABNORMAL HIGH (ref 3.5–5.1)
Sodium: 143 mEq/L (ref 135–145)

## 2017-05-31 LAB — HEPATIC FUNCTION PANEL
ALT: 39 U/L (ref 0–53)
AST: 24 U/L (ref 0–37)
Albumin: 3.4 g/dL — ABNORMAL LOW (ref 3.5–5.2)
Alkaline Phosphatase: 85 U/L (ref 39–117)
Bilirubin, Direct: 0 mg/dL (ref 0.0–0.3)
Total Bilirubin: 0.5 mg/dL (ref 0.2–1.2)
Total Protein: 6.5 g/dL (ref 6.0–8.3)

## 2017-05-31 NOTE — Patient Instructions (Addendum)
Please continue all other medications as before, and refills have been done if requested.  Please have the pharmacy call with any other refills you may need.  Please continue your efforts at being more active, low cholesterol diet, and weight control.  You are otherwise up to date with prevention measures today.  Please keep your appointments with your specialists as you may have planned  Please plan to go to the Xray dept about Mar 4 for a repeat chest xray  Please go to the LAB in the Basement (turn left off the elevator) for the tests to be done today  You will be contacted by phone if any changes need to be made immediately.  Otherwise, you will receive a letter about your results with an explanation, but please check with MyChart first.  Please remember to sign up for MyChart if you have not done so, as this will be important to you in the future with finding out test results, communicating by private email, and scheduling acute appointments online when needed.  Please return in 6 months, or sooner if neede

## 2017-05-31 NOTE — Assessment & Plan Note (Signed)
Also for f/u lab today,  to f/u any worsening symptoms or concerns 

## 2017-05-31 NOTE — Assessment & Plan Note (Signed)
Resolved, stable overall by history and exam, and pt to continue medical treatment as before,  to f/u any worsening symptoms or concerns 

## 2017-05-31 NOTE — Progress Notes (Signed)
Subjective:    Patient ID: Kristopher [name], male    DOB: Mar 16, 1931, 84 y.o.   MRN: [id number]  HPI  Here to f/u recent hospn feb 11-12 with CAP with sepsis in the setting of Essential hypertension,   CAD S/P LAD PCI 1998, RCA DES 2011   Aortic valve disorder   GERD   COPD (chronic obstructive pulmonary disease) ([place])   Chronic pain syndrome   Acute kidney injury ([place])   Acute respiratory failure ([place]) Pt recommended for f/u lab including CBC/CMP, and f/u cxr at 3-4 wks Today, Pt denies chest pain, increased sob or doe, wheezing, orthopnea, PND, increased LE swelling, palpitations, dizziness or syncope, overall feels the best in 2 wks but just not back to par yet.  Has finished zpack, now started ceftin feb 19 planned for 10 days.   Pt denies fever, wt loss, night sweats, loss of appetite, or other constitutional symptoms  Denies urinary symptoms such as dysuria, frequency, urgency, flank pain, hematuria or n/v.  Does have some low mid abd soreness and "feels empty" like he wants to eat but gets full fast, o/w Denies worsening reflux, dysphagia, n/v, or blood.  No other interval hx or complaints Past Medical History:  Diagnosis Date  . AAA (abdominal aortic aneurysm) ([place])    ultrasound 07/2011:  2.7 x 2.8 cm  . Acute myocardial infarction, unspecified site, episode of care unspecified 1998  . Aortic stenosis    echo 4/12: normal LVF, mild LAE, no significant AS  . Arthritis    "all over" (05/21/2017)  . CAD (coronary artery disease)    s/p PCI of LAD 1998;  s/p DES to RCA 05/2009;  Myoview 4/13: low risk, no ischemia, EF 56%  . CAP (community acquired pneumonia) 05/21/2017  . COPD (chronic obstructive pulmonary disease) ([place])   . DJD (degenerative joint disease), lumbar 07/06/2011  . Esophageal reflux   . HLD (hyperlipidemia)   . HTN (hypertension)   . PAD (peripheral artery disease) ([place])    s/p L CIA stent 09/2011 ([place])  . Pneumonia    "I've always had pneumonia; since I was 52  months old" (05/21/2017)  . Renal artery stenosis ([facility])    s/p bilat RA stents  . Urine incontinence    Past Surgical History:  Procedure Laterality Date  . CATARACT EXTRACTION W/ INTRAOCULAR LENS  IMPLANT, BILATERAL Bilateral YRS AGO  . CORONARY ANGIOPLASTY WITH STENT PLACEMENT  05/1996   PCI of LAD; "I've got a total of 4 stents" (05/21/2017)  . [place]  . LOWER EXTREMITY ANGIOGRAM Bilateral 09/20/2011   Procedure: LOWER EXTREMITY ANGIOGRAM;  Surgeon: Wellington Hampshire, MD;  Location: [facility] CATH LAB;  Service: Cardiovascular;  Laterality: Bilateral;  . TRANSURETHRAL RESECTION OF PROSTATE  08/11/2011   Procedure: TRANSURETHRAL RESECTION OF THE PROSTATE WITH GYRUS INSTRUMENTS;  Surgeon: Fredricka Bonine, MD;  Location: [facility];  Service: Urology;  Laterality: N/A;     . VASECTOMY  1968    reports that he quit smoking about 29 years ago. He has a 114.00 pack-year smoking history. he has never used smokeless tobacco. He reports that he drinks alcohol. He reports that he does not use drugs. family history includes Heart disease in his father and mother. Allergies  Allergen Reactions  . Influenza Vaccines Other (See Comments)    unknown  . Sulfa Antibiotics Rash   Current Outpatient Medications on File Prior to Visit  Medication Sig Dispense Refill  . Ascorbic Acid (  VITAMIN C) 500 MG tablet Take 500 mg by mouth 2 (two) times daily.     Marland Kitchen aspirin 325 MG EC tablet Take 325 mg by mouth daily.    Marland Kitchen atorvastatin (LIPITOR) 80 MG tablet Take 1 tablet (80 mg total) by mouth daily. 90 tablet 3  . BEE POLLEN PO Take by mouth.    . cefUROXime (CEFTIN) 500 MG tablet Take 1 tablet (500 mg total) by mouth 2 (two) times daily for 10 days. 20 tablet 0  . Co-Enzyme Q-10 100 MG CAPS Take 2 capsules by mouth daily.     . cyclobenzaprine (FLEXERIL) 5 MG tablet Take 1 tablet (5 mg total) by mouth at bedtime as needed for muscle spasms. 30 tablet 2  . diclofenac sodium (VOLTAREN) 1 % GEL Apply  2 g topically 4 (four) times daily as needed. 3 Tube 2  . Docusate Calcium (STOOL SOFTENER PO) Take 2 capsules by mouth daily.    . fish oil-omega-3 fatty acids 1000 MG capsule Take 1 g by mouth daily.     Marland Kitchen HYDROcodone-acetaminophen (NORCO/VICODIN) 5-325 MG tablet Take 1 tablet by mouth 3 (three) times daily before meals. 90 tablet 0  . Multiple Vitamin (MULTIVITAMIN) capsule Take 1 capsule by mouth daily.     . Multiple Vitamins-Minerals (PRESERVISION AREDS PO) Take 2 tablets by mouth daily.    . nitroGLYCERIN (NITROSTAT) 0.4 MG SL tablet Place 1 tablet (0.4 mg total) under the tongue every 5 (five) minutes as needed for chest pain. 90 tablet 3  . omeprazole (PRILOSEC) 20 MG capsule TAKE 2 CAPSULES DAILY 180 capsule 3  . ramipril (ALTACE) 10 MG capsule Take 1 capsule (10 mg total) by mouth daily. 30 capsule 0   No current facility-administered medications on file prior to visit.    Review of Systems  Constitutional: Negative for other unusual diaphoresis or sweats HENT: Negative for ear discharge or swelling Eyes: Negative for other worsening visual disturbances Respiratory: Negative for stridor or other swelling  Gastrointestinal: Negative for worsening distension or other blood Genitourinary: Negative for retention or other urinary change Musculoskeletal: Negative for other MSK pain or swelling Skin: Negative for color change or other new lesions Neurological: Negative for worsening tremors and other numbness  Psychiatric/Behavioral: Negative for worsening agitation or other fatigue All other system neg per pt    Objective:   Physical Exam BP 104/62   Pulse 75   Temp 97.8 F (36.6 C) (Oral)   Ht 5\' 9"  (1.753 m)   Wt 143 lb (64.9 kg)   SpO2 96%   BMI 21.12 kg/m  VS noted, not ill appearing Constitutional: Pt appears in NAD HENT: Head: NCAT.  Right Ear: External ear normal.  Left Ear: External ear normal.  Eyes: . Pupils are equal, round, and reactive to light. Conjunctivae  and EOM are normal Nose: without d/c or deformity Neck: Neck supple. Gross normal ROM Cardiovascular: Normal rate and regular rhythm.   Pulmonary/Chest: Effort normal and breath sounds without rales or wheezing.  Neurological: Pt is alert. At baseline orientation, motor grossly intact Skin: Skin is warm. No rashes, other new lesions, no LE edema Psychiatric: Pt behavior is normal without agitation  No other exam findings  Lab Results  Component Value Date   WBC 5.1 05/22/2017   HGB 12.9 (L) 05/22/2017   HCT 40.4 05/22/2017   PLT 121 (L) 05/22/2017   GLUCOSE 171 (H) 05/22/2017   CHOL 136 01/26/2016   TRIG 69.0 01/26/2016   HDL 47.20  01/26/2016   LDLCALC 75 01/26/2016   ALT 60 05/21/2017   AST 92 (H) 05/21/2017   NA 141 05/22/2017   K 4.1 05/22/2017   CL 110 05/22/2017   CREATININE 1.28 (H) 05/22/2017   BUN 27 (H) 05/22/2017   CO2 17 (L) 05/22/2017   TSH 3.38 01/26/2016   INR 1.01 08/31/2013       Assessment & Plan:

## 2017-05-31 NOTE — Assessment & Plan Note (Signed)
Clinically improved and stable, to finish ceftin as he has, and for cxr f/u about mar 4

## 2017-06-05 ENCOUNTER — Encounter: Payer: Self-pay | Admitting: Registered Nurse

## 2017-06-05 ENCOUNTER — Encounter: Payer: Medicare Other | Attending: Registered Nurse | Admitting: Registered Nurse

## 2017-06-05 VITALS — BP 94/58 | HR 75

## 2017-06-05 DIAGNOSIS — M1611 Unilateral primary osteoarthritis, right hip: Secondary | ICD-10-CM | POA: Insufficient documentation

## 2017-06-05 DIAGNOSIS — Z79899 Other long term (current) drug therapy: Secondary | ICD-10-CM | POA: Diagnosis not present

## 2017-06-05 DIAGNOSIS — M1711 Unilateral primary osteoarthritis, right knee: Secondary | ICD-10-CM | POA: Insufficient documentation

## 2017-06-05 DIAGNOSIS — G894 Chronic pain syndrome: Secondary | ICD-10-CM | POA: Insufficient documentation

## 2017-06-05 DIAGNOSIS — M25561 Pain in right knee: Secondary | ICD-10-CM

## 2017-06-05 DIAGNOSIS — Z5181 Encounter for therapeutic drug level monitoring: Secondary | ICD-10-CM | POA: Insufficient documentation

## 2017-06-05 DIAGNOSIS — M48062 Spinal stenosis, lumbar region with neurogenic claudication: Secondary | ICD-10-CM

## 2017-06-05 MED ORDER — HYDROCODONE-ACETAMINOPHEN 5-325 MG PO TABS: 1.0000 | ORAL_TABLET | Freq: Three times a day (TID) | ORAL | 0 refills | Status: DC

## 2017-06-05 NOTE — Progress Notes (Signed)
Subjective:    Patient ID: Kristopher [name], male    DOB: [birth year], 84 y.o.   MRN: [id number]  HPI: [name] is a 84year old male who returns for follow up appointmentfor chronic pain and medication refill. He states his pain is located in his right knee occasionally lower back pain with activity. He rates his pain 3. His current exercise regime walking and performing stretching exercises.  Mr. [name] Morphine equivalent is 12.50 MME.    Oral Swab was  Performed on 05/01/2017 it was consistent.   Pain Inventory Average Pain 3 Pain Right Now 3 My pain is dull and stabbing  In the last 24 hours, has pain interfered with the following? General activity 3 Relation with others 3 Enjoyment of life 3 What TIME of day is your pain at its worst? daytime Sleep (in general) Good  Pain is worse with: unsure Pain improves with: rest and medication Relief from Meds: 8  Mobility use a cane  Function retired  Neuro/Psych No problems in this area  Prior Studies Any changes since last visit?  no  Physicians involved in your care Any changes since last visit?  no   Family History  Problem Relation Age of Onset  . Heart disease Mother   . Heart disease Father    Social History   Socioeconomic History  . Marital status: Widowed    Spouse name: None  . Number of children: None  . Years of education: GED  . Highest education level: None  Social Needs  . Financial resource strain: None  . Food insecurity - worry: None  . Food insecurity - inability: None  . Transportation needs - medical: None  . Transportation needs - non-medical: None  Occupational History  . Occupation: retired  Tobacco Use  . Smoking status: Former Smoker    Packs/day: 3.00    Years: 38.00    Pack years: 114.00    Last attempt to quit: 04/10/1988    Years since quitting: 29.1  . Smokeless tobacco: Never Used  Substance and Sexual Activity  . Alcohol use: Yes    Comment: 05/21/2017  "might drink 6 beers/year"  . Drug use: No  . Sexual activity: None  Other Topics Concern  . None  Social History Narrative  . None   Past Surgical History:  Procedure Laterality Date  . CATARACT EXTRACTION W/ INTRAOCULAR LENS  IMPLANT, BILATERAL Bilateral YRS AGO  . CORONARY ANGIOPLASTY WITH STENT PLACEMENT  05/1996   PCI of LAD; "I've got a total of 4 stents" (05/21/2017)  . [place]  . LOWER EXTREMITY ANGIOGRAM Bilateral 09/20/2011   Procedure: LOWER EXTREMITY ANGIOGRAM;  Surgeon: Wellington Hampshire, MD;  Location: [facility] CATH LAB;  Service: Cardiovascular;  Laterality: Bilateral;  . TRANSURETHRAL RESECTION OF PROSTATE  08/11/2011   Procedure: TRANSURETHRAL RESECTION OF THE PROSTATE WITH GYRUS INSTRUMENTS;  Surgeon: Fredricka Bonine, MD;  Location: [facility];  Service: Urology;  Laterality: N/A;     . VASECTOMY  1968   Past Medical History:  Diagnosis Date  . AAA (abdominal aortic aneurysm) ([place])    ultrasound 07/2011:  2.7 x 2.8 cm  . Acute myocardial infarction, unspecified site, episode of care unspecified 1998  . Aortic stenosis    echo 4/12: normal LVF, mild LAE, no significant AS  . Arthritis    "all over" (05/21/2017)  . CAD (coronary artery disease)    s/p PCI of LAD 1998;  s/p DES to  RCA 05/2009;  Myoview 4/13: low risk, no ischemia, EF 56%  . CAP (community acquired pneumonia) 05/21/2017  . COPD (chronic obstructive pulmonary disease) ([place])   . DJD (degenerative joint disease), lumbar 07/06/2011  . Esophageal reflux   . HLD (hyperlipidemia)   . HTN (hypertension)   . PAD (peripheral artery disease) ([place])    s/p L CIA stent 09/2011 ([place])  . Pneumonia    "I've always had pneumonia; since I was 7 months old" (05/21/2017)  . Renal artery stenosis ([facility])    s/p bilat RA stents  . Urine incontinence    There were no vitals taken for this visit.  Opioid Risk Score:  0 Fall Risk Score:  `1  Depression screen PHQ 2/9  Depression screen [facility] 2/9 05/01/2017  03/29/2017 02/26/2017 12/26/2016 08/22/2016 05/05/2016 01/26/2016  Decreased Interest 0 0 0 0 0 0 0  Down, Depressed, Hopeless 0 0 0 0 0 0 0  PHQ - 2 Score 0 0 0 0 0 0 0  Altered sleeping - - - - - - -  Tired, decreased energy - - - - - - -  Change in appetite - - - - - - -  Feeling bad or failure about yourself  - - - - - - -  Trouble concentrating - - - - - - -  Moving slowly or fidgety/restless - - - - - - -  Suicidal thoughts - - - - - - -  PHQ-9 Score - - - - - - -     Review of Systems  Constitutional: Negative.   HENT: Negative.   Eyes: Negative.   Respiratory: Negative.   Cardiovascular: Negative.   Gastrointestinal: Negative.   Endocrine: Negative.   Genitourinary: Negative.   Musculoskeletal: Positive for arthralgias, back pain, gait problem, joint swelling and myalgias.  Skin: Negative.   Allergic/Immunologic: Negative.   Hematological: Negative.   Psychiatric/Behavioral: Negative.   All other systems reviewed and are negative.      Objective:   Physical Exam  Constitutional: He is oriented to person, place, and time. He appears well-developed and well-nourished.  HENT:  Head: Normocephalic and atraumatic.  Neck: Normal range of motion. Neck supple.  Cardiovascular: Normal rate and regular rhythm.  Pulmonary/Chest: Effort normal and breath sounds normal.  Musculoskeletal:  Normal Muscle Bulk and Muscle Testing Reveals: Upper Extremities: Full ROM and Muscle Strength 5/5 Back without spinal tenderness noted Lower Extremities: Full  ROM and Muscle Strength 5/5  Right Lower Extremity Flexion Produces Pain into Patella Arises from Table slowly using straight cane for support Narrow Based Gait  Neurological: He is alert and oriented to person, place, and time.  Skin: Skin is warm and dry. No erythema.  Left Finger (Third Digit) with  redness noted  Psychiatric: He has a normal mood and affect.  Nursing note and vitals reviewed.         Assessment & Plan:   1. Patellofemoral arthritis. Continue Voltaren gel, and continue with exercise regimen. 06/05/2017 2 Chronic Low Back Pain/ Lumbar Stenosis:Continue HEP as Tolerated and Continue to Monitor. 06/05/2017 3. Chronic Pain: Refilled: Hydrocodone 5/325 mg one tablet three times a day #90. 06/05/2017 We will continue the opioid monitoring program, this consists of regular clinic visits, examinations, urine drug screen, pill counts as well as use of [place] Controlled Substance reporting System. 4. Muscle Spasm: Continue Flexeril. 06/05/2017  20  minutes of face to face patient care time was spent during this visit. All questions  were encouraged and answered.  F/U in 1 month

## 2017-06-20 ENCOUNTER — Ambulatory Visit: Payer: Medicare Other | Admitting: Physician Assistant

## 2017-06-28 ENCOUNTER — Ambulatory Visit (INDEPENDENT_AMBULATORY_CARE_PROVIDER_SITE_OTHER): Payer: Medicare Other | Admitting: Physician Assistant

## 2017-06-28 ENCOUNTER — Encounter: Payer: Self-pay | Admitting: Physician Assistant

## 2017-06-28 VITALS — BP 128/76 | HR 84 | Ht 69.0 in | Wt 147.4 lb

## 2017-06-28 DIAGNOSIS — I2510 Atherosclerotic heart disease of native coronary artery without angina pectoris: Secondary | ICD-10-CM

## 2017-06-28 DIAGNOSIS — I714 Abdominal aortic aneurysm, without rupture, unspecified: Secondary | ICD-10-CM

## 2017-06-28 DIAGNOSIS — I10 Essential (primary) hypertension: Secondary | ICD-10-CM | POA: Diagnosis not present

## 2017-06-28 DIAGNOSIS — E785 Hyperlipidemia, unspecified: Secondary | ICD-10-CM | POA: Diagnosis not present

## 2017-06-28 DIAGNOSIS — J449 Chronic obstructive pulmonary disease, unspecified: Secondary | ICD-10-CM

## 2017-06-28 DIAGNOSIS — I739 Peripheral vascular disease, unspecified: Secondary | ICD-10-CM

## 2017-06-28 NOTE — Patient Instructions (Signed)
Medication Instructions:  Continue current medications  If you need a refill on your cardiac medications before your next appointment, please call your pharmacy.  Labwork: Fasting Lipids HERE IN OUR OFFICE AT [facility]  Take the provided lab slips for you to take with you to the lab for you blood draw.   You will need to fast. DO NOT EAT OR DRINK PAST MIDNIGHT.   Testing/Procedures: Your physician has requested that you have an abdominal aorta duplex. During this test, an ultrasound is used to evaluate the aorta. Allow 30 minutes for this exam. Do not eat after midnight the day before and avoid carbonated beverages   Follow-Up: Your physician wants you to follow-up in: 1 Year with Dr Stanford Breed. You should receive a reminder letter in the mail two months in advance. If you do not receive a letter, please call our office [phone].   Thank you for choosing [facility] at [facility]!!

## 2017-06-28 NOTE — Progress Notes (Signed)
Cardiology Office Note    Date:  06/28/2017   ID:  Kristopher [name], DOB August 03, 1930, MRN [id number]  PCP:  [name], MD  Cardiologist:  Dr. Stanford Breed   Chief Complaint  Patient presents with  . Follow-up    seen for Dr. Stanford Breed    History of Present Illness:  Kristopher [name] is a 84 y.o. male with PMH of CAD s/p PCI to LAD 1998, HTN, HLD, PAD s/p L CIA stent 09/2011, COPD, AAA and renal artery stenosis s/p bilateral stents.  Cardiac catheterization in February 2011 showed EF 60%, no renal artery stenosis, 90% RCA but no obstructive disease in the left system.  He underwent PCI of his RCA with drug-eluting stent.  Echocardiogram in April 2012 showed normal LV function, no significant aortic stenosis.  Lower extremity angiography in June 2013 showed small infrarenal aortic aneurysm, no renal artery stenosis, ostial right CA 60%, proximal left CIA severely calcified with 80-90%.  He underwent angioplasty and stenting of the left CIA.  He has asymptomatic Mobitz 1 degree heart block.  Last AAA ultrasound obtained on 06/27/2016 showed 2.4 x 2.6 cm infrarenal AAA.  Last Myoview in March 2018 showed EF 56%, small defect of moderate severity present in the apex location consistent with apical thinning, overall low risk study.  He was most recently admitted in February with community-acquired pneumonia.  He has since been followed by his PCP on 06/01/2017 for post hospital follow-up, he was doing well at the time.  Patient presents today for follow-up.  He said last year he did have occasional chest discomfort, he has been taking OTC nitric oxide and has not had any more chest pain.  He is ambulating at home and also occasionally pushes his lawn more without any issue.  For some of his age, he is actually quite active.  He does not have any lower extremity edema, orthopnea or PND.  He is aware that if his chest pain return, he will need to let us know.  I may have to switch his OTC nitric oxide to Imdur.   Given lack of further symptoms and a normal Myoview last year, I will not pursue any further ischemic workup at this time.  He will need a repeat AAA ultrasound.  He also is due for his lipid panel, last lipid panel was in October 2017.  He is not fasting today, however he can return anytime in the next few weeks for fasting labs.   Past Medical History:  Diagnosis Date  . AAA (abdominal aortic aneurysm) ([place])    ultrasound 07/2011:  2.7 x 2.8 cm  . Acute myocardial infarction, unspecified site, episode of care unspecified 1998  . Aortic stenosis    echo 4/12: normal LVF, mild LAE, no significant AS  . Arthritis    "all over" (05/21/2017)  . CAD (coronary artery disease)    s/p PCI of LAD 1998;  s/p DES to RCA 05/2009;  Myoview 4/13: low risk, no ischemia, EF 56%  . CAP (community acquired pneumonia) 05/21/2017  . COPD (chronic obstructive pulmonary disease) ([place])   . DJD (degenerative joint disease), lumbar 07/06/2011  . Esophageal reflux   . HLD (hyperlipidemia)   . HTN (hypertension)   . PAD (peripheral artery disease) ([place])    s/p L CIA stent 09/2011 ([place])  . Pneumonia    "I've always had pneumonia; since I was 86 months old" (05/21/2017)  . Renal artery stenosis ([facility])    s/p bilat  RA stents  . Urine incontinence     Past Surgical History:  Procedure Laterality Date  . CATARACT EXTRACTION W/ INTRAOCULAR LENS  IMPLANT, BILATERAL Bilateral YRS AGO  . CORONARY ANGIOPLASTY WITH STENT PLACEMENT  05/1996   PCI of LAD; "I've got a total of 4 stents" (05/21/2017)  . [place]  . LOWER EXTREMITY ANGIOGRAM Bilateral 09/20/2011   Procedure: LOWER EXTREMITY ANGIOGRAM;  Surgeon: Wellington Hampshire, MD;  Location: [facility];  Service: Cardiovascular;  Laterality: Bilateral;  . TRANSURETHRAL RESECTION OF PROSTATE  08/11/2011   Procedure: TRANSURETHRAL RESECTION OF THE PROSTATE WITH GYRUS INSTRUMENTS;  Surgeon: Fredricka Bonine, MD;  Location: [facility];  Service: Urology;   Laterality: N/A;     . VASECTOMY  1968    Current Medications: Outpatient Medications Prior to Visit  Medication Sig Dispense Refill  . Ascorbic Acid (VITAMIN C) 500 MG tablet Take 500 mg by mouth 2 (two) times daily.     Marland Kitchen aspirin 325 MG EC tablet Take 325 mg by mouth daily.    Marland Kitchen atorvastatin (LIPITOR) 80 MG tablet Take 1 tablet (80 mg total) by mouth daily. 90 tablet 3  . BEE POLLEN PO Take by mouth.    Marland Kitchen Co-Enzyme Q-10 100 MG CAPS Take 1 capsule by mouth every evening.     . cyclobenzaprine (FLEXERIL) 5 MG tablet Take 1 tablet (5 mg total) by mouth at bedtime as needed for muscle spasms. 30 tablet 2  . diclofenac sodium (VOLTAREN) 1 % GEL Apply 2 g topically 4 (four) times daily as needed. 3 Tube 2  . Docusate Calcium (STOOL SOFTENER PO) Take 2 capsules by mouth daily.    . fish oil-omega-3 fatty acids 1000 MG capsule Take 1 g by mouth daily.     Marland Kitchen HYDROcodone-acetaminophen (NORCO/VICODIN) 5-325 MG tablet Take 1 tablet by mouth 3 (three) times daily before meals. 90 tablet 0  . Multiple Vitamin (MULTIVITAMIN) capsule Take 1 capsule by mouth daily.     . Multiple Vitamins-Minerals (PRESERVISION AREDS PO) Take 2 tablets by mouth daily.    . nitroGLYCERIN (NITROSTAT) 0.4 MG SL tablet Place 1 tablet (0.4 mg total) under the tongue every 5 (five) minutes as needed for chest pain. 90 tablet 3  . Omega-3 Fatty Acids (OMEGA-3 FISH OIL PO) Take 2 capsules by mouth every morning. Omega Q Plus    . omeprazole (PRILOSEC) 20 MG capsule TAKE 2 CAPSULES DAILY 180 capsule 3  . OVER THE COUNTER MEDICATION Take 2 capsules by mouth every morning. NITRIC OXIDE    . ramipril (ALTACE) 10 MG capsule Take 1 capsule (10 mg total) by mouth daily. 30 capsule 0   No facility-administered medications prior to visit.      Allergies:   Influenza vaccines and Sulfa antibiotics   Social History   Socioeconomic History  . Marital status: Widowed    Spouse name: Not on file  . Number of children: Not on file  .  Years of education: GED  . Highest education level: Not on file  Occupational History  . Occupation: retired  Scientific laboratory technician  . Financial resource strain: Not on file  . Food insecurity:    Worry: Not on file    Inability: Not on file  . Transportation needs:    Medical: Not on file    Non-medical: Not on file  Tobacco Use  . Smoking status: Former Smoker    Packs/day: 3.00    Years: 38.00  Pack years: 114.00    Last attempt to quit: 04/10/1988    Years since quitting: 29.2  . Smokeless tobacco: Never Used  Substance and Sexual Activity  . Alcohol use: Yes    Comment: 05/21/2017 "might drink 6 beers/year"  . Drug use: No  . Sexual activity: Not on file  Lifestyle  . Physical activity:    Days per week: Not on file    Minutes per session: Not on file  . Stress: Not on file  Relationships  . Social connections:    Talks on phone: Not on file    Gets together: Not on file    Attends religious service: Not on file    Active member of club or organization: Not on file    Attends meetings of clubs or organizations: Not on file    Relationship status: Not on file  Other Topics Concern  . Not on file  Social History Narrative  . Not on file     Family History:  The patient's family history includes Heart disease in his father and mother.   ROS:   Please see the history of present illness.    ROS All other systems reviewed and are negative.   PHYSICAL EXAM:   VS:  BP 128/76   Pulse 84   Ht 5\' 9"  (1.753 m)   Wt 147 lb 6.4 oz (66.9 kg)   BMI 21.77 kg/m    GEN: Well nourished, well developed, in no acute distress  HEENT: normal  Neck: no JVD, carotid bruits, or masses Cardiac: RRR; no murmurs, rubs, or gallops,no edema  Respiratory: Diminished breath sound bilaterally without rhonchi, wheezing or crackle GI: soft, nontender, nondistended, + BS MS: no deformity or atrophy  Skin: warm and dry, no rash Neuro:  Alert and Oriented x 3, Strength and sensation are  intact Psych: euthymic mood, full affect  Wt Readings from Last 3 Encounters:  06/28/17 147 lb 6.4 oz (66.9 kg)  05/31/17 143 lb (64.9 kg)  05/21/17 139 lb 8.8 oz (63.3 kg)      Studies/Labs Reviewed:   EKG:  EKG is not ordered today.     Recent Labs: 05/31/2017: ALT 39; BUN 25; Creatinine, Ser 1.26; Hemoglobin 13.3; Platelets 397.0; Potassium 5.3; Sodium 143   Lipid Panel    Component Value Date/Time   CHOL 136 01/26/2016 1114   TRIG 69.0 01/26/2016 1114   HDL 47.20 01/26/2016 1114   CHOLHDL 3 01/26/2016 1114   VLDL 13.8 01/26/2016 1114   LDLCALC 75 01/26/2016 1114    Additional studies/ records that were reviewed today include:   AAA [place] 06/27/2016 Stable dimensions of the infrarenal fusiform dilatation measuring 2.4 cm x 2.6 cm. Stable >50% stenosis in the right common iliac artery.  Patent left common ilac artery stenosis. Bilateral external iliac arteries are normal in caliber.    Myoview 06/27/2016 Study Highlights    The left ventricular ejection fraction is normal (55-65%).  Nuclear stress EF: 56%.  Defect 1: There is a small defect of moderate severity present in the apex location. This defect is most consistent with apical thinning .  The study is normal.  This is a low risk study.      ASSESSMENT:    1. Coronary artery disease involving native coronary artery of native heart without angina pectoris   2. Abdominal aortic aneurysm (AAA) without rupture ([place])   3. Hyperlipidemia, unspecified hyperlipidemia type   4. Essential hypertension   5. PAD (peripheral artery disease) (  [place])   6. Chronic obstructive pulmonary disease, unspecified COPD type ([place])      PLAN:  In order of problems listed above:  1. CAD: Denies any recent anginal symptom.  He did have some occasional chest discomfort last year, however he says the symptom improved after he took the over-the-counter nitric oxide.  He has been able to push his lawnmower without any exertional  chest pain.  I will hold off on ischemic workup at this time.  If he does have breakthrough chest pain, I will switch his nitric oxide to Imdur  2. Abdominal AAA: 2.4 x 2.6 cm infrarenal AAA seen on previous Doppler, due to have repeat Doppler  3. PAD: No obvious discomfort with ambulation  4. Hypertension: Blood pressure very well controlled, continue ramipril 10 mg daily.  5. Hyperlipidemia: Continue Lipitor 80 mg daily.  He has not had any lipid panel since October 2017, he will need a repeat lipid panel.  Recent liver function test is normal.    Medication Adjustments/Labs and Tests Ordered: Current medicines are reviewed at length with the patient today.  Concerns regarding medicines are outlined above.  Medication changes, Labs and Tests ordered today are listed in the Patient Instructions below. Patient Instructions  Medication Instructions:  Continue current medications  If you need a refill on your cardiac medications before your next appointment, please call your pharmacy.  Labwork: Fasting Lipids HERE IN OUR OFFICE AT [facility]  Take the provided lab slips for you to take with you to the lab for you blood draw.   You will need to fast. DO NOT EAT OR DRINK PAST MIDNIGHT.   Testing/Procedures: Your physician has requested that you have an abdominal aorta duplex. During this test, an ultrasound is used to evaluate the aorta. Allow 30 minutes for this exam. Do not eat after midnight the day before and avoid carbonated beverages   Follow-Up: Your physician wants you to follow-up in: 1 Year with Dr Stanford Breed. You should receive a reminder letter in the mail two months in advance. If you do not receive a letter, please call our office [phone].   Thank you for choosing [facility] at [employer], [place]  06/28/2017 11:16 AM    [address], [place], [place]  [address] Phone: [phone]; Fax: [phone]

## 2017-06-29 DIAGNOSIS — E785 Hyperlipidemia, unspecified: Secondary | ICD-10-CM | POA: Diagnosis not present

## 2017-06-29 LAB — LIPID PANEL
Chol/HDL Ratio: 2.8 ratio (ref 0.0–5.0)
Cholesterol, Total: 151 mg/dL (ref 100–199)
HDL: 53 mg/dL (ref >39)
LDL Calculated: 82 mg/dL (ref 0–99)
Triglycerides: 78 mg/dL (ref 0–149)
VLDL Cholesterol Cal: 16 mg/dL (ref 5–40)

## 2017-07-03 ENCOUNTER — Other Ambulatory Visit: Payer: Self-pay

## 2017-07-03 DIAGNOSIS — E785 Hyperlipidemia, unspecified: Secondary | ICD-10-CM

## 2017-07-03 MED ORDER — ROSUVASTATIN CALCIUM 40 MG PO TABS: 40.0000 mg | ORAL_TABLET | Freq: Every day | ORAL | 2 refills | Status: DC

## 2017-07-05 ENCOUNTER — Encounter: Payer: Medicare Other | Attending: Registered Nurse | Admitting: Registered Nurse

## 2017-07-05 ENCOUNTER — Other Ambulatory Visit: Payer: Self-pay

## 2017-07-05 ENCOUNTER — Encounter: Payer: Self-pay | Admitting: Registered Nurse

## 2017-07-05 VITALS — BP 143/78 | HR 68 | Ht 69.0 in | Wt 143.6 lb

## 2017-07-05 DIAGNOSIS — M1711 Unilateral primary osteoarthritis, right knee: Secondary | ICD-10-CM | POA: Diagnosis not present

## 2017-07-05 DIAGNOSIS — M48062 Spinal stenosis, lumbar region with neurogenic claudication: Secondary | ICD-10-CM

## 2017-07-05 DIAGNOSIS — G894 Chronic pain syndrome: Secondary | ICD-10-CM | POA: Insufficient documentation

## 2017-07-05 DIAGNOSIS — Z5181 Encounter for therapeutic drug level monitoring: Secondary | ICD-10-CM | POA: Diagnosis not present

## 2017-07-05 DIAGNOSIS — Z79899 Other long term (current) drug therapy: Secondary | ICD-10-CM | POA: Insufficient documentation

## 2017-07-05 DIAGNOSIS — G609 Hereditary and idiopathic neuropathy, unspecified: Secondary | ICD-10-CM

## 2017-07-05 DIAGNOSIS — I2510 Atherosclerotic heart disease of native coronary artery without angina pectoris: Secondary | ICD-10-CM | POA: Diagnosis not present

## 2017-07-05 DIAGNOSIS — M1611 Unilateral primary osteoarthritis, right hip: Secondary | ICD-10-CM | POA: Diagnosis not present

## 2017-07-05 DIAGNOSIS — M25561 Pain in right knee: Secondary | ICD-10-CM

## 2017-07-05 MED ORDER — HYDROCODONE-ACETAMINOPHEN 5-325 MG PO TABS: 1.0000 | ORAL_TABLET | Freq: Three times a day (TID) | ORAL | 0 refills | Status: DC

## 2017-07-05 NOTE — Progress Notes (Signed)
Subjective:    Patient ID: Kristopher [name], male    DOB: April 13, 1930, 84 y.o.   MRN: [id number]  HPI: Kristopher [name] is a 84year old male who returns for follow up appointmentfor chronic pain and medication refill. He states his pain is located in his right right hip radiating into his right lower extremity with numbness and right knee pain. He rates his pain 2. His current exercise regime is walking performing stretching exercises and performing outdoor chores mowing with push mower.   Kristopher [name] Morphine equivalent is 15.00 MME.    Oral Swab was  Performed on 05/01/2017 it was consistent.   Pain Inventory Average Pain 3 Pain Right Now 2 My pain is dull and stabbing  In the last 24 hours, has pain interfered with the following? General activity 3 Relation with others 3 Enjoyment of life 3 What TIME of day is your pain at its worst? evening Sleep (in general) Good  Pain is worse with: bending Pain improves with: rest and medication Relief from Meds: 8  Mobility use a cane  Function retired  Neuro/Psych No problems in this area  Prior Studies Any changes since last visit?  no  Physicians involved in your care Any changes since last visit?  no   Family History  Problem Relation Age of Onset  . Heart disease Mother   . Heart disease Father    Social History   Socioeconomic History  . Marital status: Widowed    Spouse name: Not on file  . Number of children: Not on file  . Years of education: GED  . Highest education level: Not on file  Occupational History  . Occupation: retired  Scientific laboratory technician  . Financial resource strain: Not on file  . Food insecurity:    Worry: Not on file    Inability: Not on file  . Transportation needs:    Medical: Not on file    Non-medical: Not on file  Tobacco Use  . Smoking status: Former Smoker    Packs/day: 3.00    Years: 38.00    Pack years: 114.00    Last attempt to quit: 04/10/1988    Years since quitting: 29.2  .  Smokeless tobacco: Never Used  Substance and Sexual Activity  . Alcohol use: Yes    Comment: 05/21/2017 "might drink 6 beers/year"  . Drug use: No  . Sexual activity: Not on file  Lifestyle  . Physical activity:    Days per week: Not on file    Minutes per session: Not on file  . Stress: Not on file  Relationships  . Social connections:    Talks on phone: Not on file    Gets together: Not on file    Attends religious service: Not on file    Active member of club or organization: Not on file    Attends meetings of clubs or organizations: Not on file    Relationship status: Not on file  Other Topics Concern  . Not on file  Social History Narrative  . Not on file   Past Surgical History:  Procedure Laterality Date  . CATARACT EXTRACTION W/ INTRAOCULAR LENS  IMPLANT, BILATERAL Bilateral YRS AGO  . CORONARY ANGIOPLASTY WITH STENT PLACEMENT  05/1996   PCI of LAD; "I've got a total of 4 stents" (05/21/2017)  . [place]  . LOWER EXTREMITY ANGIOGRAM Bilateral 09/20/2011   Procedure: LOWER EXTREMITY ANGIOGRAM;  Surgeon: Wellington Hampshire, MD;  Location:  [facility];  Service: Cardiovascular;  Laterality: Bilateral;  . TRANSURETHRAL RESECTION OF PROSTATE  08/11/2011   Procedure: TRANSURETHRAL RESECTION OF THE PROSTATE WITH GYRUS INSTRUMENTS;  Surgeon: Fredricka Bonine, MD;  Location: [facility];  Service: Urology;  Laterality: N/A;     . VASECTOMY  1968   Past Medical History:  Diagnosis Date  . AAA (abdominal aortic aneurysm) ([place])    ultrasound 07/2011:  2.7 x 2.8 cm  . Acute myocardial infarction, unspecified site, episode of care unspecified 1998  . Aortic stenosis    echo 4/12: normal LVF, mild LAE, no significant AS  . Arthritis    "all over" (05/21/2017)  . CAD (coronary artery disease)    s/p PCI of LAD 1998;  s/p DES to RCA 05/2009;  Myoview 4/13: low risk, no ischemia, EF 56%  . CAP (community acquired pneumonia) 05/21/2017  . COPD (chronic obstructive  pulmonary disease) ([place])   . DJD (degenerative joint disease), lumbar 07/06/2011  . Esophageal reflux   . HLD (hyperlipidemia)   . HTN (hypertension)   . PAD (peripheral artery disease) ([place])    s/p L CIA stent 09/2011 ([place])  . Pneumonia    "I've always had pneumonia; since I was 31 months old" (05/21/2017)  . Renal artery stenosis ([facility])    s/p bilat RA stents  . Urine incontinence    BP (!) 143/78   Pulse 68   Ht 5\' 9"  (1.753 m) Comment: pt reported  Wt 143 lb 9.6 oz (65.1 kg)   SpO2 95%   BMI 21.21 kg/m   Opioid Risk Score:  0 Fall Risk Score:  `1  Depression screen PHQ 2/9  Depression screen [facility] 2/9 07/05/2017 05/01/2017 03/29/2017 02/26/2017 12/26/2016 08/22/2016 05/05/2016  Decreased Interest 0 0 0 0 0 0 0  Down, Depressed, Hopeless 0 0 0 0 0 0 0  PHQ - 2 Score 0 0 0 0 0 0 0  Altered sleeping - - - - - - -  Tired, decreased energy - - - - - - -  Change in appetite - - - - - - -  Feeling bad or failure about yourself  - - - - - - -  Trouble concentrating - - - - - - -  Moving slowly or fidgety/restless - - - - - - -  Suicidal thoughts - - - - - - -  PHQ-9 Score - - - - - - -     Review of Systems  Constitutional: Negative.   HENT: Negative.   Eyes: Negative.   Respiratory: Negative.   Cardiovascular: Negative.   Gastrointestinal: Negative.   Endocrine: Negative.   Genitourinary: Negative.   Musculoskeletal: Positive for gait problem, joint swelling and myalgias.  Skin: Negative.   Allergic/Immunologic: Negative.   Hematological: Negative.   Psychiatric/Behavioral: Negative.   All other systems reviewed and are negative.      Objective:   Physical Exam  Constitutional: He is oriented to person, place, and time. He appears well-developed and well-nourished.  HENT:  Head: Normocephalic and atraumatic.  Neck: Normal range of motion. Neck supple.  Cardiovascular: Normal rate and regular rhythm.  Pulmonary/Chest: Effort normal and breath sounds normal.    Musculoskeletal:  Normal Muscle Bulk and Muscle Testing Reveals: Upper Extremities: Full ROM and Muscle Strength 5/5 Back without spinal tenderness noted Lower Extremities: Right: Decreased ROM and Muscle Strength 4/5  Right Lower Extremity Flexion Produces Pain into Right Lower Extremity  and Right Patella Left: Full  ROM and Muscle Strength 5/5 Arises from Table slowly using straight cane for support Narrow Based Gait  Neurological: He is alert and oriented to person, place, and time.  Skin: Skin is warm and dry. No erythema.  Psychiatric: He has a normal mood and affect.  Nursing note and vitals reviewed.         Assessment & Plan:  1. Patellofemoral arthritis. Continue Voltaren gel, and continue with exercise regimen. 07/05/2017 2 Chronic Low Back Pain/ Lumbar Stenosis:Continue HEP as Tolerated and Continue to Monitor. 07/05/2017 3. Chronic Pain: Refilled: Hydrocodone 5/325 mg one tablet three times a day #90. 07/05/2017 We will continue the opioid monitoring program, this consists of regular clinic visits, examinations, urine drug screen, pill counts as well as use of [place] Controlled Substance reporting System. 4. Muscle Spasm: Continue Flexeril. 07/05/2017  20  minutes of face to face patient care time was spent during this visit. All questions were encouraged and answered.  F/U in 1 month

## 2017-07-11 ENCOUNTER — Ambulatory Visit (HOSPITAL_COMMUNITY)
Admission: RE | Admit: 2017-07-11 | Discharge: 2017-07-11 | Disposition: A | Discharge status: Home / Self Care | Payer: Medicare Other | Source: Ambulatory Visit | Attending: Cardiovascular Disease | Admitting: Cardiovascular Disease

## 2017-07-11 DIAGNOSIS — I714 Abdominal aortic aneurysm, without rupture, unspecified: Secondary | ICD-10-CM

## 2017-07-11 DIAGNOSIS — I708 Atherosclerosis of other arteries: Secondary | ICD-10-CM | POA: Insufficient documentation

## 2017-08-06 ENCOUNTER — Encounter: Payer: Medicare Other | Attending: Registered Nurse | Admitting: Registered Nurse

## 2017-08-06 ENCOUNTER — Encounter: Payer: Self-pay | Admitting: Registered Nurse

## 2017-08-06 VITALS — BP 123/73 | HR 60 | Ht 67.0 in | Wt 149.0 lb

## 2017-08-06 DIAGNOSIS — Z5181 Encounter for therapeutic drug level monitoring: Secondary | ICD-10-CM | POA: Diagnosis not present

## 2017-08-06 DIAGNOSIS — M25561 Pain in right knee: Secondary | ICD-10-CM | POA: Diagnosis not present

## 2017-08-06 DIAGNOSIS — M48062 Spinal stenosis, lumbar region with neurogenic claudication: Secondary | ICD-10-CM | POA: Diagnosis not present

## 2017-08-06 DIAGNOSIS — G894 Chronic pain syndrome: Secondary | ICD-10-CM | POA: Diagnosis not present

## 2017-08-06 DIAGNOSIS — I2510 Atherosclerotic heart disease of native coronary artery without angina pectoris: Secondary | ICD-10-CM

## 2017-08-06 DIAGNOSIS — Z79899 Other long term (current) drug therapy: Secondary | ICD-10-CM | POA: Diagnosis not present

## 2017-08-06 DIAGNOSIS — M1711 Unilateral primary osteoarthritis, right knee: Secondary | ICD-10-CM | POA: Diagnosis not present

## 2017-08-06 DIAGNOSIS — M1611 Unilateral primary osteoarthritis, right hip: Secondary | ICD-10-CM | POA: Diagnosis not present

## 2017-08-06 MED ORDER — CYCLOBENZAPRINE HCL 5 MG PO TABS: 5.0000 mg | ORAL_TABLET | Freq: Every evening | ORAL | 2 refills | Status: DC | PRN

## 2017-08-06 MED ORDER — HYDROCODONE-ACETAMINOPHEN 5-325 MG PO TABS: 1.0000 | ORAL_TABLET | Freq: Three times a day (TID) | ORAL | 0 refills | Status: DC

## 2017-08-06 NOTE — Progress Notes (Signed)
Subjective:    Patient ID: Kristopher [name], male    DOB: [birth year], 84 y.o.   MRN: [id number]  HPI: [name] is a 84 year old male who returns for follow up appointment for chronic pain and medication refill. He states his pain is located in his lower back with activity and right lower extremity. He rates his  Pain 3.  His current exercise regime is walking, outside chores cutting grass with push mower, light weights daily and performing stretching exercises.   Kristopher [name] arrived bradycardic apical pulse 60.   Kristopher [name] Morphine Equivalent is 14.00 MME.   Last Oral Swab was Performed on 05/01/2017, it was consistent.    Pain Inventory Average Pain 3 Pain Right Now 3 My pain is aching  In the last 24 hours, has pain interfered with the following? General activity 3 Relation with others 0 Enjoyment of life 0 What TIME of day is your pain at its worst? evening Sleep (in general) Fair  Pain is worse with: unsure Pain improves with: medication Relief from Meds: 10  Mobility walk with assistance use a cane how many minutes can you walk? 10 ability to climb steps?  yes do you drive?  yes transfers alone  Function retired  Neuro/Psych trouble walking  Prior Studies Any changes since last visit?  no  Physicians involved in your care Any changes since last visit?  no   Family History  Problem Relation Age of Onset  . Heart disease Mother   . Heart disease Father    Social History   Socioeconomic History  . Marital status: Widowed    Spouse name: Not on file  . Number of children: Not on file  . Years of education: GED  . Highest education level: Not on file  Occupational History  . Occupation: retired  Scientific laboratory technician  . Financial resource strain: Not on file  . Food insecurity:    Worry: Not on file    Inability: Not on file  . Transportation needs:    Medical: Not on file    Non-medical: Not on file  Tobacco Use  . Smoking status: Former  Smoker    Packs/day: 3.00    Years: 38.00    Pack years: 114.00    Last attempt to quit: 04/10/1988    Years since quitting: 29.3  . Smokeless tobacco: Never Used  Substance and Sexual Activity  . Alcohol use: Yes    Comment: 05/21/2017 "might drink 6 beers/year"  . Drug use: No  . Sexual activity: Not on file  Lifestyle  . Physical activity:    Days per week: Not on file    Minutes per session: Not on file  . Stress: Not on file  Relationships  . Social connections:    Talks on phone: Not on file    Gets together: Not on file    Attends religious service: Not on file    Active member of club or organization: Not on file    Attends meetings of clubs or organizations: Not on file    Relationship status: Not on file  Other Topics Concern  . Not on file  Social History Narrative  . Not on file   Past Surgical History:  Procedure Laterality Date  . CATARACT EXTRACTION W/ INTRAOCULAR LENS  IMPLANT, BILATERAL Bilateral YRS AGO  . CORONARY ANGIOPLASTY WITH STENT PLACEMENT  05/1996   PCI of LAD; "I've got a total of 4 stents" (05/21/2017)  .  [place]  . LOWER EXTREMITY ANGIOGRAM Bilateral 09/20/2011   Procedure: LOWER EXTREMITY ANGIOGRAM;  Surgeon: Wellington Hampshire, MD;  Location: [facility];  Service: Cardiovascular;  Laterality: Bilateral;  . TRANSURETHRAL RESECTION OF PROSTATE  08/11/2011   Procedure: TRANSURETHRAL RESECTION OF THE PROSTATE WITH GYRUS INSTRUMENTS;  Surgeon: Fredricka Bonine, MD;  Location: [facility];  Service: Urology;  Laterality: N/A;     . VASECTOMY  1968   Past Medical History:  Diagnosis Date  . AAA (abdominal aortic aneurysm) ([place])    ultrasound 07/2011:  2.7 x 2.8 cm  . Acute myocardial infarction, unspecified site, episode of care unspecified 1998  . Aortic stenosis    echo 4/12: normal LVF, mild LAE, no significant AS  . Arthritis    "all over" (05/21/2017)  . CAD (coronary artery disease)    s/p PCI of LAD 1998;  s/p DES to RCA  05/2009;  Myoview 4/13: low risk, no ischemia, EF 56%  . CAP (community acquired pneumonia) 05/21/2017  . COPD (chronic obstructive pulmonary disease) ([place])   . DJD (degenerative joint disease), lumbar 07/06/2011  . Esophageal reflux   . HLD (hyperlipidemia)   . HTN (hypertension)   . PAD (peripheral artery disease) ([place])    s/p L CIA stent 09/2011 ([place])  . Pneumonia    "I've always had pneumonia; since I was 82 months old" (05/21/2017)  . Renal artery stenosis (HCC)    s/p bilat RA stents  . Urine incontinence    BP 123/73 (BP Location: Right Arm, Patient Position: Sitting, Cuff Size: Normal)   Pulse 60   Ht 5\' 7"  (1.702 m)   Wt 149 lb (67.6 kg)   SpO2 96%   BMI 23.34 kg/m   Opioid Risk Score:   Fall Risk Score:  `1  Depression screen PHQ 2/9  Depression screen [facility] 2/9 07/05/2017 05/01/2017 03/29/2017 02/26/2017 12/26/2016 08/22/2016 05/05/2016  Decreased Interest 0 0 0 0 0 0 0  Down, Depressed, Hopeless 0 0 0 0 0 0 0  PHQ - 2 Score 0 0 0 0 0 0 0  Altered sleeping - - - - - - -  Tired, decreased energy - - - - - - -  Change in appetite - - - - - - -  Feeling bad or failure about yourself  - - - - - - -  Trouble concentrating - - - - - - -  Moving slowly or fidgety/restless - - - - - - -  Suicidal thoughts - - - - - - -  PHQ-9 Score - - - - - - -    Review of Systems  Constitutional: Negative.   HENT: Negative.   Eyes: Negative.   Respiratory: Negative.   Cardiovascular: Negative.   Gastrointestinal: Negative.   Endocrine: Negative.   Genitourinary: Negative.   Musculoskeletal: Positive for arthralgias.  Skin: Negative.   Allergic/Immunologic: Negative.   Neurological: Negative.   Hematological: Negative.   Psychiatric/Behavioral: Negative.        Objective:   Physical Exam  Constitutional: He is oriented to person, place, and time. He appears well-developed and well-nourished.  HENT:  Head: Normocephalic and atraumatic.  Neck: Normal range of motion. Neck  supple.  Cardiovascular: Normal rate.  Pulmonary/Chest: Effort normal and breath sounds normal.  Musculoskeletal:  Normal Muscle Bulk and Muscle Testing Reveals: Upper Extremities: Full ROM and Muscle Strength 5/5 Back without spinal tenderness  Lower Extremities: Right: Decreased ROM and Muscle Strength 5/5  Right Lower Extremity Flexion Produces Pain into extremity Left: Full ROM and Muscle Strength 5/5 Arises from chair with ease Narrow Based gait  Neurological: He is alert and oriented to person, place, and time.  Skin: Skin is warm and dry.  Psychiatric: He has a normal mood and affect.  Nursing note and vitals reviewed.         Assessment & Plan:  1. Patellofemoral arthritis. Continue Voltaren gel, and continue with exercise regimen. 08/06/2017 2 Chronic Low Back Pain/ Lumbar Stenosis:Continue HEP as Tolerated and Continue to Monitor. Continue current medication regimen. 08/06/2017 3. Chronic Pain: Refilled: Hydrocodone 5/325 mg one tablet three times a day #90. 07/05/2017 We will continue the opioid monitoring program, this consists of regular clinic visits, examinations, urine drug screen, pill counts as well as use of [place] Controlled Substance reporting System. 4. Muscle Spasm: Continue current medication regimen with  Flexeril. 08/06/2017  20  minutes of face to face patient care time was spent during this visit. All questions were encouraged and answered.  F/U in 1 month

## 2017-08-13 DIAGNOSIS — Z961 Presence of intraocular lens: Secondary | ICD-10-CM | POA: Diagnosis not present

## 2017-08-13 DIAGNOSIS — H353131 Nonexudative age-related macular degeneration, bilateral, early dry stage: Secondary | ICD-10-CM | POA: Diagnosis not present

## 2017-08-13 DIAGNOSIS — H04123 Dry eye syndrome of bilateral lacrimal glands: Secondary | ICD-10-CM | POA: Diagnosis not present

## 2017-09-04 ENCOUNTER — Encounter: Payer: Medicare Other | Attending: Registered Nurse

## 2017-09-04 ENCOUNTER — Ambulatory Visit (HOSPITAL_BASED_OUTPATIENT_CLINIC_OR_DEPARTMENT_OTHER): Payer: Medicare Other | Admitting: Physical Medicine & Rehabilitation

## 2017-09-04 ENCOUNTER — Telehealth: Payer: Self-pay

## 2017-09-04 ENCOUNTER — Encounter: Payer: Self-pay | Admitting: Physical Medicine & Rehabilitation

## 2017-09-04 VITALS — BP 116/69 | HR 62 | Resp 16 | Ht 67.0 in | Wt 150.0 lb

## 2017-09-04 DIAGNOSIS — M1611 Unilateral primary osteoarthritis, right hip: Secondary | ICD-10-CM | POA: Diagnosis not present

## 2017-09-04 DIAGNOSIS — M48062 Spinal stenosis, lumbar region with neurogenic claudication: Secondary | ICD-10-CM

## 2017-09-04 DIAGNOSIS — Z79899 Other long term (current) drug therapy: Secondary | ICD-10-CM | POA: Insufficient documentation

## 2017-09-04 DIAGNOSIS — M25561 Pain in right knee: Secondary | ICD-10-CM | POA: Diagnosis not present

## 2017-09-04 DIAGNOSIS — Z5181 Encounter for therapeutic drug level monitoring: Secondary | ICD-10-CM | POA: Diagnosis not present

## 2017-09-04 DIAGNOSIS — M1711 Unilateral primary osteoarthritis, right knee: Secondary | ICD-10-CM | POA: Insufficient documentation

## 2017-09-04 DIAGNOSIS — I2510 Atherosclerotic heart disease of native coronary artery without angina pectoris: Secondary | ICD-10-CM | POA: Diagnosis not present

## 2017-09-04 DIAGNOSIS — G894 Chronic pain syndrome: Secondary | ICD-10-CM | POA: Insufficient documentation

## 2017-09-04 MED ORDER — HYDROCODONE-ACETAMINOPHEN 5-325 MG PO TABS: 1.0000 | ORAL_TABLET | Freq: Three times a day (TID) | ORAL | 0 refills | Status: DC

## 2017-09-04 NOTE — Telephone Encounter (Signed)
Patient called stated that his hydrocodone medication was accidentally sent to the incorrect location, has asked if it can be sent to the [facility] on [address].

## 2017-09-04 NOTE — Progress Notes (Signed)
Subjective:    Patient ID: Kristopher [name], male    DOB: November 04, 1930, 84 y.o.   MRN: [id number] 84 year old male with lumbar spinal stenosis affecting the right quadricep, L3-L4 as well as chronic right hip pain and chronic right knee pain HPI Had followup US for AAA monitoring Admitted to to [facility] for CAP in Feb 2019 Follows with cardiology for CAD s/p PCI to LAD No new pain issues Chronic Right knee and post thigh and post calf pain, occ knee swelling with longer distance walking ([employer])  Uses cane to amb outside the home Still has push mower Pt limits WB to RLE  Cardiology:  Stanford Breed PCP:  Dr [name] Pain Inventory Average Pain 3 Pain Right Now 3 My pain is constant and tingling  In the last 24 hours, has pain interfered with the following? General activity 3 Relation with others 3 Enjoyment of life 3 What TIME of day is your pain at its worst? evening Sleep (in general) Fair  Pain is worse with: unsure Pain improves with: medication Relief from Meds: .  Mobility use a cane ability to climb steps?  yes do you drive?  yes  Function retired  Neuro/Psych No problems in this area  Prior Studies Any changes since last visit?  no  Physicians involved in your care Any changes since last visit?  no   Family History  Problem Relation Age of Onset  . Heart disease Mother   . Heart disease Father    Social History   Socioeconomic History  . Marital status: Widowed    Spouse name: Not on file  . Number of children: Not on file  . Years of education: GED  . Highest education level: Not on file  Occupational History  . Occupation: retired  Scientific laboratory technician  . Financial resource strain: Not on file  . Food insecurity:    Worry: Not on file    Inability: Not on file  . Transportation needs:    Medical: Not on file    Non-medical: Not on file  Tobacco Use  . Smoking status: Former Smoker    Packs/day: 3.00    Years: 38.00    Pack years: 114.00    Last  attempt to quit: 04/10/1988    Years since quitting: 29.4  . Smokeless tobacco: Never Used  Substance and Sexual Activity  . Alcohol use: Yes    Comment: 05/21/2017 "might drink 6 beers/year"  . Drug use: No  . Sexual activity: Not on file  Lifestyle  . Physical activity:    Days per week: Not on file    Minutes per session: Not on file  . Stress: Not on file  Relationships  . Social connections:    Talks on phone: Not on file    Gets together: Not on file    Attends religious service: Not on file    Active member of club or organization: Not on file    Attends meetings of clubs or organizations: Not on file    Relationship status: Not on file  Other Topics Concern  . Not on file  Social History Narrative  . Not on file   Past Surgical History:  Procedure Laterality Date  . CATARACT EXTRACTION W/ INTRAOCULAR LENS  IMPLANT, BILATERAL Bilateral YRS AGO  . CORONARY ANGIOPLASTY WITH STENT PLACEMENT  05/1996   PCI of LAD; "I've got a total of 4 stents" (05/21/2017)  . [place]  . LOWER EXTREMITY ANGIOGRAM Bilateral 09/20/2011  Procedure: LOWER EXTREMITY ANGIOGRAM;  Surgeon: Wellington Hampshire, MD;  Location: [facility];  Service: Cardiovascular;  Laterality: Bilateral;  . TRANSURETHRAL RESECTION OF PROSTATE  08/11/2011   Procedure: TRANSURETHRAL RESECTION OF THE PROSTATE WITH GYRUS INSTRUMENTS;  Surgeon: Fredricka Bonine, MD;  [facility];  Service: Urology;  Laterality: N/A;     . VASECTOMY  1968   Past Medical History:  Diagnosis Date  . AAA (abdominal aortic aneurysm) ([place])    ultrasound 07/2011:  2.7 x 2.8 cm  . Acute myocardial infarction, unspecified site, episode of care unspecified 1998  . Aortic stenosis    echo 4/12: normal LVF, mild LAE, no significant AS  . Arthritis    "all over" (05/21/2017)  . CAD (coronary artery disease)    s/p PCI of LAD 1998;  s/p DES to RCA 05/2009;  Myoview 4/13: low risk, no ischemia, EF 56%  . CAP (community  acquired pneumonia) 05/21/2017  . COPD (chronic obstructive pulmonary disease) ([place])   . DJD (degenerative joint disease), lumbar 07/06/2011  . Esophageal reflux   . HLD (hyperlipidemia)   . HTN (hypertension)   . PAD (peripheral artery disease) ([place])    s/p L CIA stent 09/2011 ([place])  . Pneumonia    "I've always had pneumonia; since I was 52 months old" (05/21/2017)  . Renal artery stenosis (HCC)    s/p bilat RA stents  . Urine incontinence    There were no vitals taken for this visit.  Opioid Risk Score:   Fall Risk Score:  `1  Depression screen PHQ 2/9  Depression screen [facility] 2/9 08/06/2017 07/05/2017 05/01/2017 03/29/2017 02/26/2017 12/26/2016 08/22/2016  Decreased Interest 0 0 0 0 0 0 0  Down, Depressed, Hopeless 0 0 0 0 0 0 0  PHQ - 2 Score 0 0 0 0 0 0 0  Altered sleeping - - - - - - -  Tired, decreased energy - - - - - - -  Change in appetite - - - - - - -  Feeling bad or failure about yourself  - - - - - - -  Trouble concentrating - - - - - - -  Moving slowly or fidgety/restless - - - - - - -  Suicidal thoughts - - - - - - -  PHQ-9 Score - - - - - - -     Review of Systems  Constitutional: Negative.   HENT: Negative.   Eyes: Negative.   Respiratory: Negative.   Cardiovascular: Negative.   Gastrointestinal: Negative.   Endocrine: Negative.   Genitourinary: Negative.   Musculoskeletal: Positive for arthralgias, gait problem and myalgias.  Skin: Negative.   Allergic/Immunologic: Negative.   Neurological: Positive for numbness.  Hematological: Negative.   Psychiatric/Behavioral: Negative.   All other systems reviewed and are negative.      Objective:   Physical Exam  Constitutional: He is oriented to person, place, and time. He appears well-developed and well-nourished.  HENT:  Head: Normocephalic and atraumatic.  Eyes: Pupils are equal, round, and reactive to light. EOM are normal.  Pulmonary/Chest: Effort normal.  Neurological: He is alert and oriented to  person, place, and time. He displays abnormal reflex. No sensory deficit.  Reflex Scores:      Patellar reflexes are 0 on the right side and 0 on the left side.      Achilles reflexes are 0 on the right side and 0 on the left side. Nursing note and vitals reviewed.  No  evidence of right knee effusion.  Has full range of motion.  Patient has reduced right patellar reflex compared to the left side        Assessment & Plan:  1.  Lumbar spinal stenosis chronic low back and right lower extremity pain.  No progressive weakness. 2.  Right hip osteoarthritis no flareup he is able to mow his lawn and ambulates with a cane. 3.  Right knee pain chronic osteoarthritis has done well after knee injections x2 last injection was several years ago.  No evidence of effusion.  Updated pain agreement, reviewed PMP aware website no red flags, continue hydrocodone 5/325 1 tablet p.o. 3 times daily, #90 Nurse practitioner visit 1 month

## 2017-09-04 NOTE — Telephone Encounter (Signed)
I will be happy to call and [facility], but I am not able to reorder the hydrocodone with my current credentialing.  I have started the reordering proceedings for you if that helps.

## 2017-09-04 NOTE — Telephone Encounter (Signed)
Yes please send the prescription to the desired pharmacy same medication and dosage

## 2017-09-14 ENCOUNTER — Other Ambulatory Visit: Payer: Self-pay

## 2017-09-14 DIAGNOSIS — I714 Abdominal aortic aneurysm, without rupture, unspecified: Secondary | ICD-10-CM

## 2017-10-09 ENCOUNTER — Encounter: Payer: Self-pay | Admitting: Registered Nurse

## 2017-10-09 ENCOUNTER — Encounter: Payer: Medicare Other | Attending: Registered Nurse | Admitting: Registered Nurse

## 2017-10-09 VITALS — BP 118/78 | HR 76 | Resp 16 | Ht 67.5 in | Wt 150.0 lb

## 2017-10-09 DIAGNOSIS — I2510 Atherosclerotic heart disease of native coronary artery without angina pectoris: Secondary | ICD-10-CM | POA: Diagnosis not present

## 2017-10-09 DIAGNOSIS — M1611 Unilateral primary osteoarthritis, right hip: Secondary | ICD-10-CM | POA: Insufficient documentation

## 2017-10-09 DIAGNOSIS — M25561 Pain in right knee: Secondary | ICD-10-CM

## 2017-10-09 DIAGNOSIS — G609 Hereditary and idiopathic neuropathy, unspecified: Secondary | ICD-10-CM | POA: Diagnosis not present

## 2017-10-09 DIAGNOSIS — M1711 Unilateral primary osteoarthritis, right knee: Secondary | ICD-10-CM | POA: Diagnosis not present

## 2017-10-09 DIAGNOSIS — Z5181 Encounter for therapeutic drug level monitoring: Secondary | ICD-10-CM

## 2017-10-09 DIAGNOSIS — Z79899 Other long term (current) drug therapy: Secondary | ICD-10-CM | POA: Insufficient documentation

## 2017-10-09 DIAGNOSIS — M48062 Spinal stenosis, lumbar region with neurogenic claudication: Secondary | ICD-10-CM

## 2017-10-09 DIAGNOSIS — G894 Chronic pain syndrome: Secondary | ICD-10-CM | POA: Diagnosis not present

## 2017-10-09 MED ORDER — CYCLOBENZAPRINE HCL 5 MG PO TABS: 5.0000 mg | ORAL_TABLET | Freq: Every evening | ORAL | 2 refills | Status: DC | PRN

## 2017-10-09 MED ORDER — HYDROCODONE-ACETAMINOPHEN 5-325 MG PO TABS: 1.0000 | ORAL_TABLET | Freq: Three times a day (TID) | ORAL | 0 refills | Status: DC

## 2017-10-09 NOTE — Progress Notes (Signed)
Subjective:    Patient ID: Kristopher [name], male    DOB: 06/13/1930, 84 y.o.   MRN: [id number]  HPI: [name] is a 84 year old male who returns for follow up appointment for chronic pain and medication refill. He states his pain is located in his lower back with activity and standing for long periods, right lower extremity with tingling and right knee pain. He rates his pain 3. His current exercise regime is walking and mowing.   [name] is 15.00 MME. Last Oral Swab was Performed on 05/01/2017, it was consistent.   Kristopher [name] has an open are on his right thumb, he reports it has been there for > 2 months, he will be following up with his PCP he states. Also states this has been going on for a few years it appears and disappears. He was encouraged to follow up with with PCP.  Pain Inventory Average Pain 3 Pain Right Now 3 My pain is dull and tingling  In the last 24 hours, has pain interfered with the following? General activity 3 Relation with others 3 Enjoyment of life 3 What TIME of day is your pain at its worst? evening Sleep (in general) Fair  Pain is worse with: some activites Pain improves with: medication Relief from Meds: 5  Mobility use a cane ability to climb steps?  yes do you drive?  yes  Function retired  Neuro/Psych No problems in this area  Prior Studies Any changes since last visit?  no  Physicians involved in your care Any changes since last visit?  no   Family History  Problem Relation Age of Onset  . Heart disease Mother   . Heart disease Father    Social History   Socioeconomic History  . Marital status: Widowed    Spouse name: Not on file  . Number of children: Not on file  . Years of education: GED  . Highest education level: Not on file  Occupational History  . Occupation: retired  Scientific laboratory technician  . Financial resource strain: Not on file  . Food insecurity:    Worry: Not on file    Inability: Not on  file  . Transportation needs:    Medical: Not on file    Non-medical: Not on file  Tobacco Use  . Smoking status: Former Smoker    Packs/day: 3.00    Years: 38.00    Pack years: 114.00    Last attempt to quit: 04/10/1988    Years since quitting: 29.5  . Smokeless tobacco: Never Used  Substance and Sexual Activity  . Alcohol use: Yes    Comment: 05/21/2017 "might drink 6 beers/year"  . Drug use: No  . Sexual activity: Not on file  Lifestyle  . Physical activity:    Days per week: Not on file    Minutes per session: Not on file  . Stress: Not on file  Relationships  . Social connections:    Talks on phone: Not on file    Gets together: Not on file    Attends religious service: Not on file    Active member of club or organization: Not on file    Attends meetings of clubs or organizations: Not on file    Relationship status: Not on file  Other Topics Concern  . Not on file  Social History Narrative  . Not on file   Past Surgical History:  Procedure Laterality Date  . CATARACT EXTRACTION  W/ INTRAOCULAR LENS  IMPLANT, BILATERAL Bilateral YRS AGO  . CORONARY ANGIOPLASTY WITH STENT PLACEMENT  05/1996   PCI of LAD; "I've got a total of 4 stents" (05/21/2017)  . [place]  . LOWER EXTREMITY ANGIOGRAM Bilateral 09/20/2011   Procedure: LOWER EXTREMITY ANGIOGRAM;  Surgeon: Wellington Hampshire, MD;  Location: [facility];  Service: Cardiovascular;  Laterality: Bilateral;  . TRANSURETHRAL RESECTION OF PROSTATE  08/11/2011   Procedure: TRANSURETHRAL RESECTION OF THE PROSTATE WITH GYRUS INSTRUMENTS;  Surgeon: Fredricka Bonine, MD;  [facility];  Service: Urology;  Laterality: N/A;     . VASECTOMY  1968   Past Medical History:  Diagnosis Date  . AAA (abdominal aortic aneurysm) ([place])    ultrasound 07/2011:  2.7 x 2.8 cm  . Acute myocardial infarction, unspecified site, episode of care unspecified 1998  . Aortic stenosis    echo 4/12: normal LVF, mild LAE, no  significant AS  . Arthritis    "all over" (05/21/2017)  . CAD (coronary artery disease)    s/p PCI of LAD 1998;  s/p DES to RCA 05/2009;  Myoview 4/13: low risk, no ischemia, EF 56%  . CAP (community acquired pneumonia) 05/21/2017  . COPD (chronic obstructive pulmonary disease) ([place])   . DJD (degenerative joint disease), lumbar 07/06/2011  . Esophageal reflux   . HLD (hyperlipidemia)   . HTN (hypertension)   . PAD (peripheral artery disease) ([place])    s/p L CIA stent 09/2011 ([place])  . Pneumonia    "I've always had pneumonia; since I was 59 months old" (05/21/2017)  . Renal artery stenosis (HCC)    s/p bilat RA stents  . Urine incontinence    BP 118/78   Pulse 76   Resp 16   Ht 5' 7.5" (1.715 m)   Wt 150 lb (68 kg)   BMI 23.15 kg/m   Opioid Risk Score:   Fall Risk Score:  `1  Depression screen PHQ 2/9  Depression screen [facility] 2/9 08/06/2017 07/05/2017 05/01/2017 03/29/2017 02/26/2017 12/26/2016 08/22/2016  Decreased Interest 0 0 0 0 0 0 0  Down, Depressed, Hopeless 0 0 0 0 0 0 0  PHQ - 2 Score 0 0 0 0 0 0 0  Altered sleeping - - - - - - -  Tired, decreased energy - - - - - - -  Change in appetite - - - - - - -  Feeling bad or failure about yourself  - - - - - - -  Trouble concentrating - - - - - - -  Moving slowly or fidgety/restless - - - - - - -  Suicidal thoughts - - - - - - -  PHQ-9 Score - - - - - - -     Review of Systems  Constitutional: Negative.   HENT: Negative.   Eyes: Negative.   Respiratory: Negative.   Cardiovascular: Negative.   Gastrointestinal: Negative.   Endocrine: Negative.   Genitourinary: Negative.   Musculoskeletal: Positive for arthralgias, gait problem, joint swelling and myalgias.  Skin: Positive for wound.  Allergic/Immunologic: Negative.   Hematological: Negative.   Psychiatric/Behavioral: Negative.        Objective:   Physical Exam  Constitutional: He is oriented to person, place, and time. He appears well-developed and well-nourished.    HENT:  Head: Normocephalic and atraumatic.  Neck: Normal range of motion. Neck supple.  Cardiovascular: Normal rate and regular rhythm.  Pulmonary/Chest: Effort normal and breath sounds normal.  Musculoskeletal:  Normal Muscle Bulk and Muscle Testing Reveals: Upper Extremities: Full ROM and Muscle Strength 5/5 Back without spinal tenderness Lower Extremities: Full ROM and Muscle Strength 5/5 Right: Flexion Produces Pain into Patella Arises from Table with ease Narrow based gait  Neurological: He is alert and oriented to person, place, and time.  Skin: Skin is warm and dry.  Psychiatric: He has a normal mood and affect. His behavior is normal.  Nursing note and vitals reviewed.         Assessment & Plan:  1. Patellofemoral arthritis. Continue Voltaren gel, and continue with exercise regimen. 10/09/2017 2 Chronic Low Back Pain/ Lumbar Stenosis:Continue HEP as Tolerated and Continue to Monitor. Continue current medication regimen. 10/09/2017 3. Chronic Pain: Refilled: Hydrocodone 5/325 mg one tablet three times a day #90. 10/09/2017 We will continue the opioid monitoring program, this consists of regular clinic visits, examinations, urine drug screen, pill counts as well as use of [place] Controlled Substance reporting System. 4. Muscle Spasm: Continue current medication regimen with  Flexeril. 10/09/2017  20 minutes of face to face patient care time was spent during this visit. All questions were encouraged and answered.  F/U in 1 month

## 2017-10-30 ENCOUNTER — Other Ambulatory Visit: Payer: Self-pay | Admitting: Internal Medicine

## 2017-11-08 ENCOUNTER — Encounter: Payer: Self-pay | Admitting: Registered Nurse

## 2017-11-08 ENCOUNTER — Encounter: Payer: Medicare Other | Attending: Registered Nurse | Admitting: Registered Nurse

## 2017-11-08 VITALS — BP 125/76 | HR 66 | Ht 67.5 in | Wt 150.0 lb

## 2017-11-08 DIAGNOSIS — Z79899 Other long term (current) drug therapy: Secondary | ICD-10-CM | POA: Insufficient documentation

## 2017-11-08 DIAGNOSIS — M1711 Unilateral primary osteoarthritis, right knee: Secondary | ICD-10-CM | POA: Diagnosis not present

## 2017-11-08 DIAGNOSIS — M62838 Other muscle spasm: Secondary | ICD-10-CM

## 2017-11-08 DIAGNOSIS — I2510 Atherosclerotic heart disease of native coronary artery without angina pectoris: Secondary | ICD-10-CM

## 2017-11-08 DIAGNOSIS — M25561 Pain in right knee: Secondary | ICD-10-CM | POA: Diagnosis not present

## 2017-11-08 DIAGNOSIS — M48062 Spinal stenosis, lumbar region with neurogenic claudication: Secondary | ICD-10-CM

## 2017-11-08 DIAGNOSIS — Z5181 Encounter for therapeutic drug level monitoring: Secondary | ICD-10-CM | POA: Diagnosis not present

## 2017-11-08 DIAGNOSIS — G894 Chronic pain syndrome: Secondary | ICD-10-CM | POA: Insufficient documentation

## 2017-11-08 DIAGNOSIS — M1611 Unilateral primary osteoarthritis, right hip: Secondary | ICD-10-CM | POA: Diagnosis not present

## 2017-11-08 MED ORDER — HYDROCODONE-ACETAMINOPHEN 5-325 MG PO TABS: 1.0000 | ORAL_TABLET | Freq: Three times a day (TID) | ORAL | 0 refills | Status: DC

## 2017-11-08 NOTE — Progress Notes (Signed)
Subjective:    Patient ID: Kristopher [name], male    DOB: 1931-01-16, 84 y.o.   MRN: [id number]  HPI: Kristopher [name] is a 84 year old male who returns for follow up appointment for chronic pain and medication refill. He states his pain is located in his right knee and occassionally in his lower back  with long periods of standing and activity. At this time he denies back pain. He rates his pain 3. His current exercise regime is walking and performing outside chores.   Kristopher [name] Morphine Equivalent is 15.00 MME. Last Oral Swab was Performed on 05/01/2017 it was consistent.   Pain Inventory Average Pain 3 Pain Right Now 3 My pain is dull and tingling  In the last 24 hours, has pain interfered with the following? General activity 3 Relation with others 3 Enjoyment of life 3 What TIME of day is your pain at its worst? evening Sleep (in general) Fair  Pain is worse with: bending Pain improves with: medication Relief from Meds: 3  Mobility use a cane  Function retired  Neuro/Psych No problems in this area  Prior Studies Any changes since last visit?  no  Physicians involved in your care Any changes since last visit?  no   Family History  Problem Relation Age of Onset  . Heart disease Mother   . Heart disease Father    Social History   Socioeconomic History  . Marital status: Widowed    Spouse name: Not on file  . Number of children: Not on file  . Years of education: GED  . Highest education level: Not on file  Occupational History  . Occupation: retired  Scientific laboratory technician  . Financial resource strain: Not on file  . Food insecurity:    Worry: Not on file    Inability: Not on file  . Transportation needs:    Medical: Not on file    Non-medical: Not on file  Tobacco Use  . Smoking status: Former Smoker    Packs/day: 3.00    Years: 38.00    Pack years: 114.00    Last attempt to quit: 04/10/1988    Years since quitting: 29.6  . Smokeless tobacco: Never Used    Substance and Sexual Activity  . Alcohol use: Yes    Comment: 05/21/2017 "might drink 6 beers/year"  . Drug use: No  . Sexual activity: Not on file  Lifestyle  . Physical activity:    Days per week: Not on file    Minutes per session: Not on file  . Stress: Not on file  Relationships  . Social connections:    Talks on phone: Not on file    Gets together: Not on file    Attends religious service: Not on file    Active member of club or organization: Not on file    Attends meetings of clubs or organizations: Not on file    Relationship status: Not on file  Other Topics Concern  . Not on file  Social History Narrative  . Not on file   Past Surgical History:  Procedure Laterality Date  . CATARACT EXTRACTION W/ INTRAOCULAR LENS  IMPLANT, BILATERAL Bilateral YRS AGO  . CORONARY ANGIOPLASTY WITH STENT PLACEMENT  05/1996   PCI of LAD; "I've got a total of 4 stents" (05/21/2017)  . [place]  . LOWER EXTREMITY ANGIOGRAM Bilateral 09/20/2011   Procedure: LOWER EXTREMITY ANGIOGRAM;  Surgeon: Wellington Hampshire, MD;  Location: [facility]  CATH LAB;  Service: Cardiovascular;  Laterality: Bilateral;  . TRANSURETHRAL RESECTION OF PROSTATE  08/11/2011   Procedure: TRANSURETHRAL RESECTION OF THE PROSTATE WITH GYRUS INSTRUMENTS;  Surgeon: Fredricka Bonine, MD;  Location: [facility] ORS;  Service: Urology;  Laterality: N/A;     . VASECTOMY  1968   Past Medical History:  Diagnosis Date  . AAA (abdominal aortic aneurysm) ([place])    ultrasound 07/2011:  2.7 x 2.8 cm  . Acute myocardial infarction, unspecified site, episode of care unspecified 1998  . Aortic stenosis    echo 4/12: normal LVF, mild LAE, no significant AS  . Arthritis    "all over" (05/21/2017)  . CAD (coronary artery disease)    s/p PCI of LAD 1998;  s/p DES to RCA 05/2009;  Myoview 4/13: low risk, no ischemia, EF 56%  . CAP (community acquired pneumonia) 05/21/2017  . COPD (chronic obstructive pulmonary disease) ([place])   . DJD  (degenerative joint disease), lumbar 07/06/2011  . Esophageal reflux   . HLD (hyperlipidemia)   . HTN (hypertension)   . PAD (peripheral artery disease) ([place])    s/p L CIA stent 09/2011 ([place])  . Pneumonia    "I've always had pneumonia; since I was 27 months old" (05/21/2017)  . Renal artery stenosis (HCC)    s/p bilat RA stents  . Urine incontinence    There were no vitals taken for this visit.  Opioid Risk Score:   Fall Risk Score:  `1  Depression screen PHQ 2/9  Depression screen [facility] 2/9 08/06/2017 07/05/2017 05/01/2017 03/29/2017 02/26/2017 12/26/2016 08/22/2016  Decreased Interest 0 0 0 0 0 0 0  Down, Depressed, Hopeless 0 0 0 0 0 0 0  PHQ - 2 Score 0 0 0 0 0 0 0  Altered sleeping - - - - - - -  Tired, decreased energy - - - - - - -  Change in appetite - - - - - - -  Feeling bad or failure about yourself  - - - - - - -  Trouble concentrating - - - - - - -  Moving slowly or fidgety/restless - - - - - - -  Suicidal thoughts - - - - - - -  PHQ-9 Score - - - - - - -     Review of Systems  Constitutional: Negative.   HENT: Negative.   Eyes: Negative.   Respiratory: Negative.   Cardiovascular: Negative.   Gastrointestinal: Negative.   Endocrine: Negative.   Genitourinary: Negative.   Musculoskeletal: Positive for arthralgias, gait problem and joint swelling.  Skin: Negative.   Allergic/Immunologic: Negative.   Neurological: Positive for numbness.  Hematological: Negative.   Psychiatric/Behavioral: Negative.   All other systems reviewed and are negative.      Objective:   Physical Exam  Constitutional: He is oriented to person, place, and time. He appears well-developed and well-nourished.  HENT:  Head: Normocephalic and atraumatic.  Neck: Normal range of motion. Neck supple.  Cardiovascular: Normal rate and regular rhythm.  Pulmonary/Chest: Effort normal and breath sounds normal.  Musculoskeletal:  Normal Muscle Bulk and Muscle Testing Reveals:  Upper  Extremities: Full ROM and Muscle Strength 5/5 Back without spinal tenderness noted 5/5 Lower Extremities: Full ROM and Muscle Strength 5/5 Right Lower Extremity Flexion Produces Pain in Patella Arises from Table with ease, using cane for support Narrow Based Gait  Neurological: He is alert and oriented to person, place, and time.  Skin: Skin is warm and dry.  Psychiatric: He has a normal mood and affect. His behavior is normal.  Nursing note and vitals reviewed.         Assessment & Plan:  1. Patellofemoral arthritis. Continue Voltaren gel, and continue with exercise regimen. 11/08/2017 2 Chronic Low Back Pain/ Lumbar Stenosis:Continue HEP as Tolerated and Continue to Monitor.Continue current medication regimen.11/08/2017 3. Chronic Pain: Refilled: Hydrocodone 5/325 mg one tablet three times a day #90. Second script e-scribe, to accommodate his scheduled appointment.  11/08/2017 We will continue the opioid monitoring program, this consists of regular clinic visits, examinations, urine drug screen, pill counts as well as use of [place] Controlled Substance reporting System. 4. Muscle Spasm: Continuecurrent medication regimen withFlexeril. 11/08/2017  20 minutes of face to face patient care time was spent during this visit. All questions were encouraged and answered.  F/U in 1 month

## 2017-11-12 LAB — DRUG TOX MONITOR 1 W/CONF, ORAL FLD
Amphetamines: NEGATIVE ng/mL (ref <10)
Barbiturates: NEGATIVE ng/mL (ref <10)
Benzodiazepines: NEGATIVE ng/mL (ref <0.50)
Buprenorphine: NEGATIVE ng/mL (ref <0.10)
Cocaine: NEGATIVE ng/mL (ref <5.0)
Codeine: NEGATIVE ng/mL (ref <2.5)
Dihydrocodeine: NEGATIVE ng/mL (ref <2.5)
Fentanyl: NEGATIVE ng/mL (ref <0.10)
Heroin Metabolite: NEGATIVE ng/mL (ref <1.0)
Hydrocodone: 27.9 ng/mL — ABNORMAL HIGH (ref <2.5)
Hydromorphone: NEGATIVE ng/mL (ref <2.5)
MARIJUANA: NEGATIVE ng/mL (ref <2.5)
MDMA: NEGATIVE ng/mL (ref <10)
Meprobamate: NEGATIVE ng/mL (ref <2.5)
Methadone: NEGATIVE ng/mL (ref <5.0)
Morphine: NEGATIVE ng/mL (ref <2.5)
Nicotine Metabolite: NEGATIVE ng/mL (ref <5.0)
Norhydrocodone: 3.3 ng/mL — ABNORMAL HIGH (ref <2.5)
Noroxycodone: NEGATIVE ng/mL (ref <2.5)
Opiates: POSITIVE ng/mL — AB (ref <2.5)
Oxycodone: NEGATIVE ng/mL (ref <2.5)
Oxymorphone: NEGATIVE ng/mL (ref <2.5)
Phencyclidine: NEGATIVE ng/mL (ref <10)
Tapentadol: NEGATIVE ng/mL (ref <5.0)
Tramadol: NEGATIVE ng/mL (ref <5.0)
Zolpidem: NEGATIVE ng/mL (ref <5.0)

## 2017-11-12 LAB — DRUG TOX ALC METAB W/CON, ORAL FLD: Alcohol Metabolite: NEGATIVE ng/mL (ref <25)

## 2017-11-16 ENCOUNTER — Telehealth: Payer: Self-pay | Admitting: *Deleted

## 2017-11-16 NOTE — Telephone Encounter (Signed)
Oral swab drug screen was consistent for prescribed medications.  ?

## 2017-11-30 ENCOUNTER — Encounter: Payer: Self-pay | Admitting: Internal Medicine

## 2017-11-30 ENCOUNTER — Ambulatory Visit (INDEPENDENT_AMBULATORY_CARE_PROVIDER_SITE_OTHER): Payer: Medicare Other | Admitting: Internal Medicine

## 2017-11-30 ENCOUNTER — Other Ambulatory Visit (INDEPENDENT_AMBULATORY_CARE_PROVIDER_SITE_OTHER): Payer: Medicare Other

## 2017-11-30 VITALS — BP 126/68 | HR 65 | Temp 97.5°F | Ht 67.5 in | Wt 150.0 lb

## 2017-11-30 DIAGNOSIS — I2510 Atherosclerotic heart disease of native coronary artery without angina pectoris: Secondary | ICD-10-CM | POA: Diagnosis not present

## 2017-11-30 DIAGNOSIS — R739 Hyperglycemia, unspecified: Secondary | ICD-10-CM

## 2017-11-30 DIAGNOSIS — I10 Essential (primary) hypertension: Secondary | ICD-10-CM | POA: Diagnosis not present

## 2017-11-30 DIAGNOSIS — E119 Type 2 diabetes mellitus without complications: Secondary | ICD-10-CM | POA: Insufficient documentation

## 2017-11-30 DIAGNOSIS — L989 Disorder of the skin and subcutaneous tissue, unspecified: Secondary | ICD-10-CM | POA: Diagnosis not present

## 2017-11-30 DIAGNOSIS — Z23 Encounter for immunization: Secondary | ICD-10-CM

## 2017-11-30 DIAGNOSIS — E785 Hyperlipidemia, unspecified: Secondary | ICD-10-CM

## 2017-11-30 LAB — CBC WITH DIFFERENTIAL/PLATELET
Basophils Absolute: 0.1 10*3/uL (ref 0.0–0.1)
Basophils Relative: 1 % (ref 0.0–3.0)
Eosinophils Absolute: 0.4 10*3/uL (ref 0.0–0.7)
Eosinophils Relative: 4.9 % (ref 0.0–5.0)
HCT: 37.9 % — ABNORMAL LOW (ref 39.0–52.0)
Hemoglobin: 12.7 g/dL — ABNORMAL LOW (ref 13.0–17.0)
Lymphocytes Relative: 33.3 % (ref 12.0–46.0)
Lymphs Abs: 2.5 10*3/uL (ref 0.7–4.0)
MCHC: 33.4 g/dL (ref 30.0–36.0)
MCV: 91.5 fl (ref 78.0–100.0)
Monocytes Absolute: 0.6 10*3/uL (ref 0.1–1.0)
Monocytes Relative: 8.3 % (ref 3.0–12.0)
Neutro Abs: 3.9 10*3/uL (ref 1.4–7.7)
Neutrophils Relative %: 52.5 % (ref 43.0–77.0)
Platelets: 207 10*3/uL (ref 150.0–400.0)
RBC: 4.14 Mil/uL — ABNORMAL LOW (ref 4.22–5.81)
RDW: 15.6 % — ABNORMAL HIGH (ref 11.5–15.5)
WBC: 7.4 10*3/uL (ref 4.0–10.5)

## 2017-11-30 LAB — URINALYSIS, ROUTINE W REFLEX MICROSCOPIC
Bilirubin Urine: NEGATIVE
Hgb urine dipstick: NEGATIVE
Ketones, ur: NEGATIVE
Leukocytes, UA: NEGATIVE
Nitrite: NEGATIVE
RBC / HPF: NONE SEEN (ref 0–?)
Specific Gravity, Urine: 1.025 (ref 1.000–1.030)
Total Protein, Urine: NEGATIVE
Urine Glucose: NEGATIVE
Urobilinogen, UA: 0.2 (ref 0.0–1.0)
pH: 5.5 (ref 5.0–8.0)

## 2017-11-30 LAB — BASIC METABOLIC PANEL
BUN: 33 mg/dL — ABNORMAL HIGH (ref 6–23)
CO2: 27 mEq/L (ref 19–32)
Calcium: 9.4 mg/dL (ref 8.4–10.5)
Chloride: 109 mEq/L (ref 96–112)
Creatinine, Ser: 1.24 mg/dL (ref 0.40–1.50)
GFR: 58.86 mL/min — ABNORMAL LOW (ref >60.00)
Glucose, Bld: 113 mg/dL — ABNORMAL HIGH (ref 70–99)
Potassium: 4.6 mEq/L (ref 3.5–5.1)
Sodium: 142 mEq/L (ref 135–145)

## 2017-11-30 LAB — LIPID PANEL
Cholesterol: 121 mg/dL (ref 0–200)
HDL: 45.3 mg/dL (ref >39.00)
LDL Cholesterol: 55 mg/dL (ref 0–99)
NonHDL: 75.86
Total CHOL/HDL Ratio: 3
Triglycerides: 103 mg/dL (ref 0.0–149.0)
VLDL: 20.6 mg/dL (ref 0.0–40.0)

## 2017-11-30 LAB — HEPATIC FUNCTION PANEL
ALT: 17 U/L (ref 0–53)
AST: 21 U/L (ref 0–37)
Albumin: 4.1 g/dL (ref 3.5–5.2)
Alkaline Phosphatase: 84 U/L (ref 39–117)
Bilirubin, Direct: 0.1 mg/dL (ref 0.0–0.3)
Total Bilirubin: 0.5 mg/dL (ref 0.2–1.2)
Total Protein: 6.8 g/dL (ref 6.0–8.3)

## 2017-11-30 LAB — TSH: TSH: 3.74 u[IU]/mL (ref 0.35–4.50)

## 2017-11-30 LAB — HEMOGLOBIN A1C: Hgb A1c MFr Bld: 6.6 % — ABNORMAL HIGH (ref 4.6–6.5)

## 2017-11-30 NOTE — Patient Instructions (Addendum)
You had the Tdap tetanus shot today  Please continue all other medications as before, and refills have been done if requested.  Please have the pharmacy call with any other refills you may need.  Please continue your efforts at being more active, low cholesterol diet, and weight control.  You are otherwise up to date with prevention measures today.  Please keep your appointments with your specialists as you may have planned  You will be contacted regarding the referral for: [place]  Please go to the LAB in the Basement (turn left off the elevator) for the tests to be done today  You will be contacted by phone if any changes need to be made immediately.  Otherwise, you will receive a letter about your results with an explanation, but please check with MyChart first.  Please remember to sign up for MyChart if you have not done so, as this will be important to you in the future with finding out test results, communicating by private email, and scheduling acute appointments online when needed.  Please return in 6 months, or sooner if needed

## 2017-11-30 NOTE — Addendum Note (Signed)
Addended by: Marcina Millard on: 11/30/2017 04:25 PM   Modules accepted: Orders

## 2017-11-30 NOTE — Assessment & Plan Note (Signed)
stable overall by history and exam, recent data reviewed with pt, and pt to continue medical treatment as before,  to f/u any worsening symptoms or concerns, for f/u lab today 

## 2017-11-30 NOTE — Assessment & Plan Note (Signed)
stable overall by history and exam, recent data reviewed with pt, and pt to continue medical treatment as before,  to f/u any worsening symptoms or concerns BP Readings from Last 3 Encounters:  11/30/17 126/68  11/08/17 125/76  10/09/17 118/78

## 2017-11-30 NOTE — Progress Notes (Signed)
Subjective:    Patient ID: Kristopher [name], male    DOB: 1931-03-14, 84 y.o.   MRN: [id number]  HPI  Here to f/u; overall doing ok,  Pt denies chest pain, increasing sob or doe, wheezing, orthopnea, PND, increased LE swelling, palpitations, dizziness or syncope.  Pt denies new neurological symptoms such as new headache, or facial or extremity weakness or numbness.  Pt denies polydipsia, polyuria, or low sugar episode.  Pt states overall good compliance with meds, mostly trying to follow appropriate diet, with wt overall stable,  but little exercise however. Left thumb base with 1/2 cm ulcerated area with heaped edges not better peroxide and triple antibiotic for 2 mo, just getting bigger.  Still taking hydrocodone 5.325 for left knee djd per pain management for about 2 yrs.  No other new complaints Past Medical History:  Diagnosis Date  . AAA (abdominal aortic aneurysm) ([place])    ultrasound 07/2011:  2.7 x 2.8 cm  . Acute myocardial infarction, unspecified site, episode of care unspecified 1998  . Aortic stenosis    echo 4/12: normal LVF, mild LAE, no significant AS  . Arthritis    "all over" (05/21/2017)  . CAD (coronary artery disease)    s/p PCI of LAD 1998;  s/p DES to RCA 05/2009;  Myoview 4/13: low risk, no ischemia, EF 56%  . CAP (community acquired pneumonia) 05/21/2017  . COPD (chronic obstructive pulmonary disease) ([place])   . DJD (degenerative joint disease), lumbar 07/06/2011  . Esophageal reflux   . HLD (hyperlipidemia)   . HTN (hypertension)   . PAD (peripheral artery disease) ([place])    s/p L CIA stent 09/2011 ([place])  . Pneumonia    "I've always had pneumonia; since I was 80 months old" (05/21/2017)  . Renal artery stenosis (HCC)    s/p bilat RA stents  . Urine incontinence    Past Surgical History:  Procedure Laterality Date  . CATARACT EXTRACTION W/ INTRAOCULAR LENS  IMPLANT, BILATERAL Bilateral YRS AGO  . CORONARY ANGIOPLASTY WITH STENT PLACEMENT  05/1996   PCI of LAD;  "I've got a total of 4 stents" (05/21/2017)  . [place]  . LOWER EXTREMITY ANGIOGRAM Bilateral 09/20/2011   Procedure: LOWER EXTREMITY ANGIOGRAM;  Surgeon: Wellington Hampshire, MD;  Location: [facility] CATH LAB;  Service: Cardiovascular;  Laterality: Bilateral;  . TRANSURETHRAL RESECTION OF PROSTATE  08/11/2011   Procedure: TRANSURETHRAL RESECTION OF THE PROSTATE WITH GYRUS INSTRUMENTS;  Surgeon: Fredricka Bonine, MD;  Location: [facility];  Service: Urology;  Laterality: N/A;     . VASECTOMY  1968    reports that he quit smoking about 29 years ago. He has a 114.00 pack-year smoking history. He has never used smokeless tobacco. He reports that he drinks alcohol. He reports that he does not use drugs. family history includes Heart disease in his father and mother. Allergies  Allergen Reactions  . Influenza Vaccines Other (See Comments)    unknown  . Sulfa Antibiotics Rash   Current Outpatient Medications on File Prior to Visit  Medication Sig Dispense Refill  . Ascorbic Acid (VITAMIN C) 500 MG tablet Take 500 mg by mouth 2 (two) times daily.     Marland Kitchen aspirin 325 MG EC tablet Take 325 mg by mouth daily.    Marland Kitchen BEE POLLEN PO Take by mouth.    Marland Kitchen Co-Enzyme Q-10 100 MG CAPS Take 1 capsule by mouth every evening.     . cyclobenzaprine (FLEXERIL) 5 MG tablet  Take 1 tablet (5 mg total) by mouth at bedtime as needed for muscle spasms. 30 tablet 2  . diclofenac sodium (VOLTAREN) 1 % GEL Apply 2 g topically 4 (four) times daily as needed. 3 Tube 2  . Docusate Calcium (STOOL SOFTENER PO) Take 2 capsules by mouth daily.    Marland Kitchen HYDROcodone-acetaminophen (NORCO/VICODIN) 5-325 MG tablet Take 1 tablet by mouth 3 (three) times daily before meals. 90 tablet 0  . Multiple Vitamin (MULTIVITAMIN) capsule Take 1 capsule by mouth daily.     . Multiple Vitamins-Minerals (PRESERVISION AREDS PO) Take 2 tablets by mouth daily.    . nitroGLYCERIN (NITROSTAT) 0.4 MG SL tablet Place 1 tablet (0.4 mg total) under the  tongue every 5 (five) minutes as needed for chest pain. 90 tablet 3  . Omega-3 Fatty Acids (OMEGA-3 PLUS PO) Take by mouth.    Marland Kitchen omeprazole (PRILOSEC) 20 MG capsule TAKE 2 CAPSULES DAILY 180 capsule 1  . OVER THE COUNTER MEDICATION Take 2 capsules by mouth every morning. NITRIC OXIDE    . ramipril (ALTACE) 10 MG capsule Take 1 capsule (10 mg total) by mouth daily. 30 capsule 0  . rosuvastatin (CRESTOR) 40 MG tablet Take 1 tablet (40 mg total) by mouth daily. 90 tablet 2   No current facility-administered medications on file prior to visit.    Review of Systems  Constitutional: Negative for other unusual diaphoresis or sweats HENT: Negative for ear discharge or swelling Eyes: Negative for other worsening visual disturbances Respiratory: Negative for stridor or other swelling  Gastrointestinal: Negative for worsening distension or other blood Genitourinary: Negative for retention or other urinary change Musculoskeletal: Negative for other MSK pain or swelling Skin: Negative for color change or other new lesions Neurological: Negative for worsening tremors and other numbness  Psychiatric/Behavioral: Negative for worsening agitation or other fatigue ALl other system neg per pt    Objective:   Physical Exam BP 126/68 (BP Location: Right Arm, Patient Position: Sitting, Cuff Size: Normal)   Pulse 65   Temp (!) 97.5 F (36.4 C) (Oral)   Ht 5' 7.5" (1.715 m)   Wt 150 lb (68 kg)   SpO2 96%   BMI 23.15 kg/m  VS noted, not ill appearing Constitutional: Pt appears in NAD HENT: Head: NCAT.  Right Ear: External ear normal.  Left Ear: External ear normal.  Eyes: . Pupils are equal, round, and reactive to light. Conjunctivae and EOM are normal Nose: without d/c or deformity Neck: Neck supple. Gross normal ROM Cardiovascular: Normal rate and regular rhythm.   Pulmonary/Chest: Effort normal and breath sounds without rales or wheezing.  Abd:  Soft, NT, ND, + BS, no organomegaly Neurological:  Pt is alert. At baseline orientation, motor grossly intact Skin: Skin is warm. No rashes, no LE edema, has 1/2 cm at post base of left thumb near [facility] with central ulcer and heaped edges Psychiatric: Pt behavior is normal without agitation  No other exam findings Lab Results  Component Value Date   WBC 12.9 (H) 05/31/2017   HGB 13.3 05/31/2017   HCT 40.1 05/31/2017   PLT 397.0 05/31/2017   GLUCOSE 110 (H) 05/31/2017   CHOL 151 06/29/2017   TRIG 78 06/29/2017   HDL 53 06/29/2017   LDLCALC 82 06/29/2017   ALT 39 05/31/2017   AST 24 05/31/2017   NA 143 05/31/2017   K 5.3 (H) 05/31/2017   CL 109 05/31/2017   CREATININE 1.26 05/31/2017   BUN 25 (H) 05/31/2017   CO2  28 05/31/2017   TSH 3.38 01/26/2016   INR 1.01 08/31/2013       Assessment & Plan:

## 2017-11-30 NOTE — Assessment & Plan Note (Signed)
stable overall by history and exam, recent data reviewed with pt, and pt to continue medical treatment as before,  to f/u any worsening symptoms or concerns, for a1c with lab 

## 2017-11-30 NOTE — Assessment & Plan Note (Signed)
Consistent with basal [facility] ca - [facility]

## 2017-12-11 ENCOUNTER — Telehealth: Payer: Self-pay

## 2017-12-11 NOTE — Telephone Encounter (Signed)
Referral has been in place for the [facility] since aug 23  Ok to refer pt to [facility] if he has further concerns

## 2017-12-11 NOTE — Telephone Encounter (Signed)
Copied from [place] [phone]. Topic: Referral - Question >> Dec 11, 2017 10:04 AM Ivar Drape wrote: Reason for CRM:  Patient stated he was suppose to get a referral to the [facility] and he hasn't heard anything yet.  Please advise.

## 2017-12-20 ENCOUNTER — Encounter: Payer: Medicare Other | Attending: Registered Nurse | Admitting: Registered Nurse

## 2017-12-20 ENCOUNTER — Encounter: Payer: Self-pay | Admitting: Registered Nurse

## 2017-12-20 VITALS — BP 113/64 | HR 63 | Resp 16 | Ht 67.5 in | Wt 150.0 lb

## 2017-12-20 DIAGNOSIS — G894 Chronic pain syndrome: Secondary | ICD-10-CM | POA: Insufficient documentation

## 2017-12-20 DIAGNOSIS — Z79899 Other long term (current) drug therapy: Secondary | ICD-10-CM

## 2017-12-20 DIAGNOSIS — M62838 Other muscle spasm: Secondary | ICD-10-CM

## 2017-12-20 DIAGNOSIS — I2510 Atherosclerotic heart disease of native coronary artery without angina pectoris: Secondary | ICD-10-CM

## 2017-12-20 DIAGNOSIS — M25561 Pain in right knee: Secondary | ICD-10-CM | POA: Diagnosis not present

## 2017-12-20 DIAGNOSIS — M1711 Unilateral primary osteoarthritis, right knee: Secondary | ICD-10-CM | POA: Diagnosis not present

## 2017-12-20 DIAGNOSIS — M48062 Spinal stenosis, lumbar region with neurogenic claudication: Secondary | ICD-10-CM | POA: Diagnosis not present

## 2017-12-20 DIAGNOSIS — M1611 Unilateral primary osteoarthritis, right hip: Secondary | ICD-10-CM | POA: Insufficient documentation

## 2017-12-20 DIAGNOSIS — Z5181 Encounter for therapeutic drug level monitoring: Secondary | ICD-10-CM | POA: Diagnosis not present

## 2017-12-20 MED ORDER — CYCLOBENZAPRINE HCL 5 MG PO TABS: 5.0000 mg | ORAL_TABLET | Freq: Every evening | ORAL | 2 refills | Status: DC | PRN

## 2017-12-20 NOTE — Progress Notes (Signed)
Subjective:    Patient ID: Kristopher [name], male    DOB: [birth year], 84 y.o.   MRN: [id number]  HPI: [name] is a 84 year old male who returns for follow up appointment for chronic pain and medication refill. He states his pain is located in his lower back with activity and right knee pain. He rates his pain 2. His current exercise regime walking and mowing grass with push mower.   [name] Morphine Equivalent is 15.00 MME. Last Oral Swab was Performed on 11/08/2017, it was consistent.   Pain Inventory Average Pain 3 Pain Right Now 2 My pain is constant and dull  In the last 24 hours, has pain interfered with the following? General activity 3 Relation with others 3 Enjoyment of life 3 What TIME of day is your pain at its worst? morning Sleep (in general) Fair  Pain is worse with: walking Pain improves with: medication Relief from Meds: 5  Mobility use a cane  Function retired  Neuro/Psych No problems in this area  Prior Studies Any changes since last visit?  no  Physicians involved in your care Any changes since last visit?  no   Family History  Problem Relation Age of Onset  . Heart disease Mother   . Heart disease Father    Social History   Socioeconomic History  . Marital status: Widowed    Spouse name: Not on file  . Number of children: Not on file  . Years of education: GED  . Highest education level: Not on file  Occupational History  . Occupation: retired  Scientific laboratory technician  . Financial resource strain: Not on file  . Food insecurity:    Worry: Not on file    Inability: Not on file  . Transportation needs:    Medical: Not on file    Non-medical: Not on file  Tobacco Use  . Smoking status: Former Smoker    Packs/day: 3.00    Years: 38.00    Pack years: 114.00    Last attempt to quit: 04/10/1988    Years since quitting: 29.7  . Smokeless tobacco: Never Used  Substance and Sexual Activity  . Alcohol use: Yes    Comment: 05/21/2017  "might drink 6 beers/year"  . Drug use: No  . Sexual activity: Not on file  Lifestyle  . Physical activity:    Days per week: Not on file    Minutes per session: Not on file  . Stress: Not on file  Relationships  . Social connections:    Talks on phone: Not on file    Gets together: Not on file    Attends religious service: Not on file    Active member of club or organization: Not on file    Attends meetings of clubs or organizations: Not on file    Relationship status: Not on file  Other Topics Concern  . Not on file  Social History Narrative  . Not on file   Past Surgical History:  Procedure Laterality Date  . CATARACT EXTRACTION W/ INTRAOCULAR LENS  IMPLANT, BILATERAL Bilateral YRS AGO  . CORONARY ANGIOPLASTY WITH STENT PLACEMENT  05/1996   PCI of LAD; "I've got a total of 4 stents" (05/21/2017)  . [place]  . LOWER EXTREMITY ANGIOGRAM Bilateral 09/20/2011   Procedure: LOWER EXTREMITY ANGIOGRAM;  Surgeon: Wellington Hampshire, MD;  Location: [facility] CATH LAB;  Service: Cardiovascular;  Laterality: Bilateral;  . TRANSURETHRAL RESECTION OF PROSTATE  08/11/2011   Procedure: TRANSURETHRAL RESECTION OF THE PROSTATE WITH GYRUS INSTRUMENTS;  Surgeon: Fredricka Bonine, MD;  [facility];  Service: Urology;  Laterality: N/A;     . VASECTOMY  1968   Past Medical History:  Diagnosis Date  . AAA (abdominal aortic aneurysm) ([place])    ultrasound 07/2011:  2.7 x 2.8 cm  . Acute myocardial infarction, unspecified site, episode of care unspecified 1998  . Aortic stenosis    echo 4/12: normal LVF, mild LAE, no significant AS  . Arthritis    "all over" (05/21/2017)  . CAD (coronary artery disease)    s/p PCI of LAD 1998;  s/p DES to RCA 05/2009;  Myoview 4/13: low risk, no ischemia, EF 56%  . CAP (community acquired pneumonia) 05/21/2017  . COPD (chronic obstructive pulmonary disease) ([place])   . DJD (degenerative joint disease), lumbar 07/06/2011  . Esophageal reflux   . HLD  (hyperlipidemia)   . HTN (hypertension)   . PAD (peripheral artery disease) ([place])    s/p L CIA stent 09/2011 ([place])  . Pneumonia    "I've always had pneumonia; since I was 28 months old" (05/21/2017)  . Renal artery stenosis (HCC)    s/p bilat RA stents  . Urine incontinence    There were no vitals taken for this visit.  Opioid Risk Score:   Fall Risk Score:  `1  Depression screen PHQ 2/9  Depression screen [facility] 2/9 11/30/2017 08/06/2017 07/05/2017 05/01/2017 03/29/2017 02/26/2017 12/26/2016  Decreased Interest 0 0 0 0 0 0 0  Down, Depressed, Hopeless 0 0 0 0 0 0 0  PHQ - 2 Score 0 0 0 0 0 0 0  Altered sleeping - - - - - - -  Tired, decreased energy - - - - - - -  Change in appetite - - - - - - -  Feeling bad or failure about yourself  - - - - - - -  Trouble concentrating - - - - - - -  Moving slowly or fidgety/restless - - - - - - -  Suicidal thoughts - - - - - - -  PHQ-9 Score - - - - - - -     Review of Systems  Constitutional: Negative.   HENT: Negative.   Eyes: Negative.   Respiratory: Negative.   Cardiovascular: Negative.   Gastrointestinal: Negative.   Endocrine: Negative.   Genitourinary: Negative.   Musculoskeletal: Positive for arthralgias, joint swelling and myalgias.  Skin: Negative.   Allergic/Immunologic: Negative.   Neurological: Negative.   Hematological: Negative.   Psychiatric/Behavioral: Negative.   All other systems reviewed and are negative.      Objective:   Physical Exam  Constitutional: He is oriented to person, place, and time. He appears well-developed and well-nourished.  HENT:  Head: Normocephalic and atraumatic.  Neck: Normal range of motion. Neck supple.  Cardiovascular: Normal rate and regular rhythm.  Pulmonary/Chest: Effort normal and breath sounds normal.  Musculoskeletal:  Normal Muscle Bulk and Muscle Testing Reveals: Upper Extremities: Full ROM and Muscle Strength 5/5 Back without spinal tenderness noted Lower Extremities:  Right: Decreased ROM and Muscle Strength 4/5 Right Lower Extremity Flexion Produces Pain into Patella Left: Full ROM and Muscle Strength 5/5 Arises from Table with ease Narrow Based Gait  Neurological: He is alert and oriented to person, place, and time.  Skin: Skin is warm and dry.  Psychiatric: He has a normal mood and affect. His behavior is normal.  Nursing note  and vitals reviewed.         Assessment & Plan:  1. Patellofemoral arthritis. Continue Voltaren gel, and continue with exercise regimen. 12/21/2017 2 Chronic Low Back Pain/ Lumbar Stenosis:Continue HEP as Tolerated and Continue to Monitor.Continue current medication regimen.12/21/2017 3. Chronic Pain: Refilled: Hydrocodone 5/325 mg one tablet three times a day #90. 12/21/2017 We will continue the opioid monitoring program, this consists of regular clinic visits, examinations, urine drug screen, pill counts as well as use of [place] Controlled Substance reporting System. 4. Muscle Spasm: Continuecurrent medication regimen withFlexeril. 12/21/2017  20 minutes of face to face patient care time was spent during this visit. All questions were encouraged and answered.  F/U in 1 month

## 2017-12-26 ENCOUNTER — Other Ambulatory Visit: Payer: Self-pay | Admitting: Cardiology

## 2018-01-09 ENCOUNTER — Encounter: Payer: Medicare Other | Attending: Registered Nurse | Admitting: Registered Nurse

## 2018-01-09 ENCOUNTER — Other Ambulatory Visit: Payer: Self-pay

## 2018-01-09 ENCOUNTER — Encounter: Payer: Self-pay | Admitting: Registered Nurse

## 2018-01-09 ENCOUNTER — Encounter: Payer: Medicare Other | Admitting: Registered Nurse

## 2018-01-09 ENCOUNTER — Telehealth: Payer: Self-pay | Admitting: Registered Nurse

## 2018-01-09 VITALS — BP 145/80 | HR 70 | Ht 67.5 in | Wt 149.8 lb

## 2018-01-09 DIAGNOSIS — I2510 Atherosclerotic heart disease of native coronary artery without angina pectoris: Secondary | ICD-10-CM | POA: Diagnosis not present

## 2018-01-09 DIAGNOSIS — M1611 Unilateral primary osteoarthritis, right hip: Secondary | ICD-10-CM | POA: Insufficient documentation

## 2018-01-09 DIAGNOSIS — M25561 Pain in right knee: Secondary | ICD-10-CM

## 2018-01-09 DIAGNOSIS — M48062 Spinal stenosis, lumbar region with neurogenic claudication: Secondary | ICD-10-CM | POA: Diagnosis not present

## 2018-01-09 DIAGNOSIS — Z5181 Encounter for therapeutic drug level monitoring: Secondary | ICD-10-CM | POA: Insufficient documentation

## 2018-01-09 DIAGNOSIS — M62838 Other muscle spasm: Secondary | ICD-10-CM

## 2018-01-09 DIAGNOSIS — G894 Chronic pain syndrome: Secondary | ICD-10-CM | POA: Insufficient documentation

## 2018-01-09 DIAGNOSIS — Z79899 Other long term (current) drug therapy: Secondary | ICD-10-CM | POA: Diagnosis not present

## 2018-01-09 DIAGNOSIS — M1711 Unilateral primary osteoarthritis, right knee: Secondary | ICD-10-CM | POA: Diagnosis not present

## 2018-01-09 MED ORDER — HYDROCODONE-ACETAMINOPHEN 5-325 MG PO TABS: 1.0000 | ORAL_TABLET | Freq: Three times a day (TID) | ORAL | 0 refills | Status: DC

## 2018-01-09 NOTE — Telephone Encounter (Signed)
Kristopher [name] on [address] states he is out of hydrocodoe 325 #[phone] 1 tid ptn would like for you to have them ent to [address] instead their numbre is [phone] he will delete as it's electronic and he cannot forward

## 2018-01-09 NOTE — Progress Notes (Signed)
Subjective:    Patient ID: Kristopher [name], male    DOB: 18-Apr-1930, 84 y.o.   MRN: [id number]  HPI: Kristopher [name] is a 84 year old male who returns for follow up appointment for chronic pain and medication refill. He states his pain is located in his right knee and occasionally with lower back with activities. He rates his pain 4. His current exercise regime is walking and performing stretching exercises and outside chores.   Kristopher [name] Morphine Equivalent is 15.00 MME. Last Oral Swab was Performed on 11/08/2017, it was consistent.   Kristopher [name] reports his brother passed away, emotional support given.    Pain Inventory Average Pain 3 Pain Right Now 4 My pain is sharp  In the last 24 hours, has pain interfered with the following? General activity 2 Relation with others 2 Enjoyment of life 2 What TIME of day is your pain at its worst? night Sleep (in general) Fair  Pain is worse with: walking Pain improves with: medication Relief from Meds: 6  Mobility use a cane how many minutes can you walk? 10 ability to climb steps?  yes do you drive?  yes  Function Do you have any goals in this area?  no  Neuro/Psych No problems in this area  Prior Studies Any changes since last visit?  no  Physicians involved in your care Any changes since last visit?  no   Family History  Problem Relation Age of Onset  . Heart disease Mother   . Heart disease Father    Social History   Socioeconomic History  . Marital status: Widowed    Spouse name: Not on file  . Number of children: Not on file  . Years of education: GED  . Highest education level: Not on file  Occupational History  . Occupation: retired  Scientific laboratory technician  . Financial resource strain: Not on file  . Food insecurity:    Worry: Not on file    Inability: Not on file  . Transportation needs:    Medical: Not on file    Non-medical: Not on file  Tobacco Use  . Smoking status: Former Smoker    Packs/day: 3.00   Years: 38.00    Pack years: 114.00    Last attempt to quit: 04/10/1988    Years since quitting: 29.7  . Smokeless tobacco: Never Used  Substance and Sexual Activity  . Alcohol use: Yes    Comment: 05/21/2017 "might drink 6 beers/year"  . Drug use: No  . Sexual activity: Not on file  Lifestyle  . Physical activity:    Days per week: Not on file    Minutes per session: Not on file  . Stress: Not on file  Relationships  . Social connections:    Talks on phone: Not on file    Gets together: Not on file    Attends religious service: Not on file    Active member of club or organization: Not on file    Attends meetings of clubs or organizations: Not on file    Relationship status: Not on file  Other Topics Concern  . Not on file  Social History Narrative  . Not on file   Past Surgical History:  Procedure Laterality Date  . CATARACT EXTRACTION W/ INTRAOCULAR LENS  IMPLANT, BILATERAL Bilateral YRS AGO  . CORONARY ANGIOPLASTY WITH STENT PLACEMENT  05/1996   PCI of LAD; "I've got a total of 4 stents" (05/21/2017)  . HEMORRHOID SURGERY  1954  . LOWER EXTREMITY ANGIOGRAM Bilateral 09/20/2011   Procedure: LOWER EXTREMITY ANGIOGRAM;  Surgeon: Wellington Hampshire, MD;  Location: [facility];  Service: Cardiovascular;  Laterality: Bilateral;  . TRANSURETHRAL RESECTION OF PROSTATE  08/11/2011   Procedure: TRANSURETHRAL RESECTION OF THE PROSTATE WITH GYRUS INSTRUMENTS;  Surgeon: Fredricka Bonine, MD;  Location[facility];  Service: Urology;  Laterality: N/A;     . VASECTOMY  1968   Past Medical History:  Diagnosis Date  . AAA (abdominal aortic aneurysm) ([place])    ultrasound 07/2011:  2.7 x 2.8 cm  . Acute myocardial infarction, unspecified site, episode of care unspecified 1998  . Aortic stenosis    echo 4/12: normal LVF, mild LAE, no significant AS  . Arthritis    "all over" (05/21/2017)  . CAD (coronary artery disease)    s/p PCI of LAD 1998;  s/p DES to RCA 05/2009;  Myoview 4/13: low risk,  no ischemia, EF 56%  . CAP (community acquired pneumonia) 05/21/2017  . COPD (chronic obstructive pulmonary disease) ([place])   . DJD (degenerative joint disease), lumbar 07/06/2011  . Esophageal reflux   . HLD (hyperlipidemia)   . HTN (hypertension)   . PAD (peripheral artery disease) ([place])    s/p L CIA stent 09/2011 ([place])  . Pneumonia    "I've always had pneumonia; since I was 39 months old" (05/21/2017)  . Renal artery stenosis (HCC)    s/p bilat RA stents  . Urine incontinence    BP (!) 145/80   Pulse 70   Ht 5' 7.5" (1.715 m)   Wt 149 lb 12.8 oz (67.9 kg)   SpO2 96%   BMI 23.12 kg/m   Opioid Risk Score:   Fall Risk Score:  `1  Depression screen PHQ 2/9  Depression screen [facility] 2/9 01/09/2018 11/30/2017 08/06/2017 07/05/2017 05/01/2017 03/29/2017 02/26/2017  Decreased Interest 0 0 0 0 0 0 0  Down, Depressed, Hopeless 0 0 0 0 0 0 0  PHQ - 2 Score 0 0 0 0 0 0 0  Altered sleeping - - - - - - -  Tired, decreased energy - - - - - - -  Change in appetite - - - - - - -  Feeling bad or failure about yourself  - - - - - - -  Trouble concentrating - - - - - - -  Moving slowly or fidgety/restless - - - - - - -  Suicidal thoughts - - - - - - -  PHQ-9 Score - - - - - - -   Review of Systems  Constitutional: Negative.   HENT: Negative.   Eyes: Negative.   Respiratory: Negative.   Cardiovascular: Negative.   Gastrointestinal: Negative.   Endocrine: Negative.   Genitourinary: Negative.   Musculoskeletal: Negative.   Skin: Negative.   Allergic/Immunologic: Negative.   Neurological: Negative.   Hematological: Negative.   Psychiatric/Behavioral: Negative.   All other systems reviewed and are negative.      Objective:   Physical Exam  Constitutional: He is oriented to person, place, and time. He appears well-developed and well-nourished.  HENT:  Head: Normocephalic and atraumatic.  Neck: Normal range of motion. Neck supple.  Cardiovascular: Normal rate and regular rhythm.    Pulmonary/Chest: Effort normal and breath sounds normal.  Musculoskeletal:  Normal Muscle Bulk and Muscle Testing Reveals: Upper Extremities: Full ROM and Muscle Strength 5/5 Back without spinal tenderness noted Lower Extremities: Full ROM and Muscle Strength 5/5 Right Lower  Extremity Flexion Produces Pain into Patella and right lower extremity Arises from Table with ease Narrow Based Gait  Neurological: He is alert and oriented to person, place, and time.  Skin: Skin is warm and dry.  Psychiatric: He has a normal mood and affect. His behavior is normal.  Nursing note and vitals reviewed.         Assessment & Plan:  1. Patellofemoral arthritis. Continue Voltaren gel, and continue with exercise regimen. 01/09/2018 2 Chronic Low Back Pain/ Lumbar Stenosis:Continue HEP as Tolerated and Continue to Monitor.Continue current medication regimen.01/09/2018 3. Chronic Pain: Refilled: Hydrocodone 5/325 mg one tablet three times a day #90.01/09/2018 We will continue the opioid monitoring program, this consists of regular clinic visits, examinations, urine drug screen, pill counts as well as use of [place] Controlled Substance reporting System. 4. Muscle Spasm: Continuecurrent medication regimen withFlexeril. 01/09/2018  20 minutes of face to face patient care time was spent during this visit. All questions were encouraged and answered.  F/U in 1 month

## 2018-01-09 NOTE — Telephone Encounter (Signed)
Hydrocodone prescription sent to [facility] on [facility]. Placed a call to Kristopher [name] left message regarding the above.

## 2018-01-31 DIAGNOSIS — C44619 Basal cell carcinoma of skin of left upper limb, including shoulder: Secondary | ICD-10-CM | POA: Diagnosis not present

## 2018-01-31 DIAGNOSIS — L570 Actinic keratosis: Secondary | ICD-10-CM | POA: Diagnosis not present

## 2018-01-31 DIAGNOSIS — D485 Neoplasm of uncertain behavior of skin: Secondary | ICD-10-CM | POA: Diagnosis not present

## 2018-02-12 ENCOUNTER — Encounter: Payer: Self-pay | Admitting: Physical Medicine & Rehabilitation

## 2018-02-12 ENCOUNTER — Other Ambulatory Visit: Payer: Self-pay

## 2018-02-12 ENCOUNTER — Ambulatory Visit (HOSPITAL_BASED_OUTPATIENT_CLINIC_OR_DEPARTMENT_OTHER): Payer: Medicare Other | Admitting: Physical Medicine & Rehabilitation

## 2018-02-12 ENCOUNTER — Encounter: Payer: Medicare Other | Attending: Registered Nurse

## 2018-02-12 VITALS — BP 123/76 | HR 76 | Ht 67.5 in | Wt 150.4 lb

## 2018-02-12 DIAGNOSIS — M1611 Unilateral primary osteoarthritis, right hip: Secondary | ICD-10-CM | POA: Diagnosis not present

## 2018-02-12 DIAGNOSIS — M25561 Pain in right knee: Secondary | ICD-10-CM

## 2018-02-12 DIAGNOSIS — Z79899 Other long term (current) drug therapy: Secondary | ICD-10-CM | POA: Diagnosis not present

## 2018-02-12 DIAGNOSIS — M1711 Unilateral primary osteoarthritis, right knee: Secondary | ICD-10-CM | POA: Insufficient documentation

## 2018-02-12 DIAGNOSIS — M48062 Spinal stenosis, lumbar region with neurogenic claudication: Secondary | ICD-10-CM | POA: Diagnosis not present

## 2018-02-12 DIAGNOSIS — Z5181 Encounter for therapeutic drug level monitoring: Secondary | ICD-10-CM | POA: Insufficient documentation

## 2018-02-12 DIAGNOSIS — G894 Chronic pain syndrome: Secondary | ICD-10-CM | POA: Diagnosis not present

## 2018-02-12 DIAGNOSIS — I2510 Atherosclerotic heart disease of native coronary artery without angina pectoris: Secondary | ICD-10-CM | POA: Diagnosis not present

## 2018-02-12 MED ORDER — HYDROCODONE-ACETAMINOPHEN 5-325 MG PO TABS: 1.0000 | ORAL_TABLET | Freq: Three times a day (TID) | ORAL | 0 refills | Status: DC

## 2018-02-12 NOTE — Patient Instructions (Signed)
Stay active.

## 2018-02-12 NOTE — Progress Notes (Signed)
Subjective:    Patient ID: Kristopher [name], male    DOB: May 02, 1930, 84 y.o.   MRN: [id number] 84 year old male with lumbar spinal stenosis affecting the right quadricep, L3-L4 as well as chronic right hip pain and chronic right knee pain  Had followup US for AAA monitoring Admitted to to [facility] for CAP in Feb 2019 Follows with cardiology for CAD s/p PCI to LAD No new pain issues Chronic Right knee and post thigh and post calf pain, occ knee swelling  Pt limits WB to RLE HPI Push mower broke, still using weed eater Lives close to family Right buttocks pain increased after a long drive Right knee pain intermittent Still Mod I for all self care, uses cane to amb only outside the house Lives in [place] home, goes up and down several times a day Pain Inventory Average Pain 3 Pain Right Now 2 My pain is dull and aching  In the last 24 hours, has pain interfered with the following? General activity 0 Relation with others 0 Enjoyment of life 0 What TIME of day is your pain at its worst? varies Sleep (in general) NA  Pain is worse with: some activites Pain improves with: medication Relief from Meds: 6  Mobility use a cane how many minutes can you walk? 10-12 ability to climb steps?  yes do you drive?  yes  Function retired  Neuro/Psych No problems in this area  Prior Studies Any changes since last visit?  no  Physicians involved in your care Any changes since last visit?  no   Family History  Problem Relation Age of Onset  . Heart disease Mother   . Heart disease Father    Social History   Socioeconomic History  . Marital status: Widowed    Spouse name: Not on file  . Number of children: Not on file  . Years of education: GED  . Highest education level: Not on file  Occupational History  . Occupation: retired  Scientific laboratory technician  . Financial resource strain: Not on file  . Food insecurity:    Worry: Not on file    Inability: Not on file  . Transportation needs:     Medical: Not on file    Non-medical: Not on file  Tobacco Use  . Smoking status: Former Smoker    Packs/day: 3.00    Years: 38.00    Pack years: 114.00    Last attempt to quit: 04/10/1988    Years since quitting: 29.8  . Smokeless tobacco: Never Used  Substance and Sexual Activity  . Alcohol use: Yes    Comment: 05/21/2017 "might drink 6 beers/year"  . Drug use: No  . Sexual activity: Not on file  Lifestyle  . Physical activity:    Days per week: Not on file    Minutes per session: Not on file  . Stress: Not on file  Relationships  . Social connections:    Talks on phone: Not on file    Gets together: Not on file    Attends religious service: Not on file    Active member of club or organization: Not on file    Attends meetings of clubs or organizations: Not on file    Relationship status: Not on file  Other Topics Concern  . Not on file  Social History Narrative  . Not on file   Past Surgical History:  Procedure Laterality Date  . CATARACT EXTRACTION W/ INTRAOCULAR LENS  IMPLANT, BILATERAL Bilateral YRS AGO  .  CORONARY ANGIOPLASTY WITH STENT PLACEMENT  05/1996   PCI of LAD; "I've got a total of 4 stents" (05/21/2017)  . [place]  . LOWER EXTREMITY ANGIOGRAM Bilateral 09/20/2011   Procedure: LOWER EXTREMITY ANGIOGRAM;  Surgeon: Wellington Hampshire, MD;  Location: [facility] CATH [facility];  Service: Cardiovascular;  Laterality: Bilateral;  . TRANSURETHRAL RESECTION OF PROSTATE  08/11/2011   Procedure: TRANSURETHRAL RESECTION OF THE PROSTATE WITH GYRUS INSTRUMENTS;  Surgeon: Fredricka Bonine, MD;  [facility];  Service: Urology;  Laterality: N/A;     . VASECTOMY  1968   Past Medical History:  Diagnosis Date  . AAA (abdominal aortic aneurysm) ([place])    ultrasound 07/2011:  2.7 x 2.8 cm  . Acute myocardial infarction, unspecified site, episode of care unspecified 1998  . Aortic stenosis    echo 4/12: normal LVF, mild LAE, no significant AS  . Arthritis    "all  over" (05/21/2017)  . CAD (coronary artery disease)    s/p PCI of LAD 1998;  s/p DES to RCA 05/2009;  Myoview 4/13: low risk, no ischemia, EF 56%  . CAP (community acquired pneumonia) 05/21/2017  . COPD (chronic obstructive pulmonary disease) ([place])   . DJD (degenerative joint disease), lumbar 07/06/2011  . Esophageal reflux   . HLD (hyperlipidemia)   . HTN (hypertension)   . PAD (peripheral artery disease) ([place])    s/p L CIA stent 09/2011 ([place])  . Pneumonia    "I've always had pneumonia; since I was 63 months old" (05/21/2017)  . Renal artery stenosis (HCC)    s/p bilat RA stents  . Urine incontinence    BP 123/76   Pulse 76   Ht 5' 7.5" (1.715 m) Comment: reported  Wt 150 lb 6.4 oz (68.2 kg)   SpO2 95%   BMI 23.21 kg/m   Opioid Risk Score:   Fall Risk Score:  `1  Depression screen PHQ 2/9  Depression screen [facility] 2/9 02/12/2018 01/09/2018 11/30/2017 08/06/2017 07/05/2017 05/01/2017 03/29/2017  Decreased Interest 0 0 0 0 0 0 0  Down, Depressed, Hopeless 0 0 0 0 0 0 0  PHQ - 2 Score 0 0 0 0 0 0 0  Altered sleeping - - - - - - -  Tired, decreased energy - - - - - - -  Change in appetite - - - - - - -  Feeling bad or failure about yourself  - - - - - - -  Trouble concentrating - - - - - - -  Moving slowly or fidgety/restless - - - - - - -  Suicidal thoughts - - - - - - -  PHQ-9 Score - - - - - - -    Review of Systems  Constitutional: Negative.   HENT: Negative.   Eyes: Negative.   Respiratory: Negative.   Cardiovascular: Negative.   Gastrointestinal: Negative.   Endocrine: Negative.   Genitourinary: Negative.   Musculoskeletal: Negative.   Skin: Negative.   Allergic/Immunologic: Negative.   Neurological: Negative.   Hematological: Negative.   Psychiatric/Behavioral: Negative.   All other systems reviewed and are negative.      Objective:   Physical Exam  Constitutional: He is oriented to person, place, and time. He appears well-developed and well-nourished. No  distress.  HENT:  Head: Normocephalic and atraumatic.  Eyes: Pupils are equal, round, and reactive to light. EOM are normal.  Neurological: He is alert and oriented to person, place, and time.  Skin: Skin is  warm and dry. He is not diaphoretic.  Psychiatric: He has a normal mood and affect.  Nursing note and vitals reviewed. Patient has no evidence of knee effusion bilaterally normal knee flexion extension bilaterally Motor strength is 4/5 right hip flexor 5/5 left hip flexor 5/5 bilateral knee extensor ankle dorsiflexor. Ambulates with a cane no evidence of toe drag or knee instability Timed up and go is less than 10 seconds Lumbar spine has normal flexion extension is 75% No tenderness palpation in the lumbar sacral spine area        Assessment & Plan:  #1.  Lumbar spinal stenosis with chronic pain he does have a chronic right L3 radiculopathy, clinically main sign is mild hip flexor weakness. He does have chronic pain related to this and remains on hydrocodone 5 mg 3 times daily No signs of aberrant drug behavior Last urine toxicology 11/08/2017 was appropriate PMP reviewed today no red flag

## 2018-02-13 ENCOUNTER — Other Ambulatory Visit: Payer: Self-pay | Admitting: Registered Nurse

## 2018-02-22 DIAGNOSIS — C44619 Basal cell carcinoma of skin of left upper limb, including shoulder: Secondary | ICD-10-CM | POA: Diagnosis not present

## 2018-03-01 DIAGNOSIS — Z4802 Encounter for removal of sutures: Secondary | ICD-10-CM | POA: Diagnosis not present

## 2018-03-11 DIAGNOSIS — D225 Melanocytic nevi of trunk: Secondary | ICD-10-CM | POA: Diagnosis not present

## 2018-03-11 DIAGNOSIS — Z4889 Encounter for other specified surgical aftercare: Secondary | ICD-10-CM | POA: Diagnosis not present

## 2018-03-11 DIAGNOSIS — I781 Nevus, non-neoplastic: Secondary | ICD-10-CM | POA: Diagnosis not present

## 2018-03-11 DIAGNOSIS — Z4801 Encounter for change or removal of surgical wound dressing: Secondary | ICD-10-CM | POA: Diagnosis not present

## 2018-03-11 DIAGNOSIS — D485 Neoplasm of uncertain behavior of skin: Secondary | ICD-10-CM | POA: Diagnosis not present

## 2018-03-11 DIAGNOSIS — Z4802 Encounter for removal of sutures: Secondary | ICD-10-CM | POA: Diagnosis not present

## 2018-03-11 DIAGNOSIS — Z483 Aftercare following surgery for neoplasm: Secondary | ICD-10-CM | POA: Diagnosis not present

## 2018-03-11 DIAGNOSIS — C44619 Basal cell carcinoma of skin of left upper limb, including shoulder: Secondary | ICD-10-CM | POA: Diagnosis not present

## 2018-03-12 ENCOUNTER — Encounter: Payer: Medicare Other | Attending: Registered Nurse | Admitting: Registered Nurse

## 2018-03-12 ENCOUNTER — Encounter: Payer: Self-pay | Admitting: Registered Nurse

## 2018-03-12 VITALS — BP 118/70 | HR 70 | Ht 67.0 in | Wt 150.0 lb

## 2018-03-12 DIAGNOSIS — M25561 Pain in right knee: Secondary | ICD-10-CM | POA: Diagnosis not present

## 2018-03-12 DIAGNOSIS — G609 Hereditary and idiopathic neuropathy, unspecified: Secondary | ICD-10-CM

## 2018-03-12 DIAGNOSIS — Z79899 Other long term (current) drug therapy: Secondary | ICD-10-CM | POA: Insufficient documentation

## 2018-03-12 DIAGNOSIS — M1611 Unilateral primary osteoarthritis, right hip: Secondary | ICD-10-CM | POA: Diagnosis not present

## 2018-03-12 DIAGNOSIS — I2510 Atherosclerotic heart disease of native coronary artery without angina pectoris: Secondary | ICD-10-CM

## 2018-03-12 DIAGNOSIS — Z5181 Encounter for therapeutic drug level monitoring: Secondary | ICD-10-CM | POA: Insufficient documentation

## 2018-03-12 DIAGNOSIS — M62838 Other muscle spasm: Secondary | ICD-10-CM

## 2018-03-12 DIAGNOSIS — G894 Chronic pain syndrome: Secondary | ICD-10-CM | POA: Diagnosis not present

## 2018-03-12 DIAGNOSIS — M1711 Unilateral primary osteoarthritis, right knee: Secondary | ICD-10-CM | POA: Diagnosis not present

## 2018-03-12 DIAGNOSIS — M48062 Spinal stenosis, lumbar region with neurogenic claudication: Secondary | ICD-10-CM

## 2018-03-12 MED ORDER — CYCLOBENZAPRINE HCL 5 MG PO TABS: 5.0000 mg | ORAL_TABLET | Freq: Every evening | ORAL | 2 refills | Status: DC | PRN

## 2018-03-12 MED ORDER — HYDROCODONE-ACETAMINOPHEN 5-325 MG PO TABS: 1.0000 | ORAL_TABLET | Freq: Three times a day (TID) | ORAL | 0 refills | Status: DC

## 2018-03-12 NOTE — Progress Notes (Signed)
Subjective:    Patient ID: Kristopher [name], male    DOB: 1930/09/04, 84 y.o.   MRN: [id number]  HPI: Kristopher [name] is a 84 y.o. male who returns for follow up appointment for chronic pain and medication refill. He states his pain is located in his left hand, lower back,ight lower extremity with tingling and numbness, right hip and right knee pain. He rates his pain 5. His current exercise regime is walking and performing stretching exercises.  Mr. Class had a surgical excision of a cutaneous malignancy using continuous microscopic control for Basal Cell Carcinoma by Dr. Shary Decamp on 02/22/2018 at [facility].   Mr. Memmott Morphine equivalent is 15.00 MME. Last Oral Swab was Performed on 11/08/2017, it was consistent.   Pain Inventory Average Pain 3 Pain Right Now 5 My pain is sharp and dull  In the last 24 hours, has pain interfered with the following? General activity 0 Relation with others 0 Enjoyment of life 0 What TIME of day is your pain at its worst? evening Sleep (in general) Fair  Pain is worse with: standing Pain improves with: medication Relief from Meds: 8  Mobility use a cane  Function retired  Neuro/Psych No problems in this area  Prior Studies Any changes since last visit?  no  Physicians involved in your care Any changes since last visit?  no   Family History  Problem Relation Age of Onset  . Heart disease Mother   . Heart disease Father    Social History   Socioeconomic History  . Marital status: Widowed    Spouse name: Not on file  . Number of children: Not on file  . Years of education: GED  . Highest education level: Not on file  Occupational History  . Occupation: retired  Scientific laboratory technician  . Financial resource strain: Not on file  . Food insecurity:    Worry: Not on file    Inability: Not on file  . Transportation needs:    Medical: Not on file    Non-medical: Not on file  Tobacco Use  . Smoking status: Former  Smoker    Packs/day: 3.00    Years: 38.00    Pack years: 114.00    Last attempt to quit: 04/10/1988    Years since quitting: 29.9  . Smokeless tobacco: Never Used  Substance and Sexual Activity  . Alcohol use: Yes    Comment: 05/21/2017 "might drink 6 beers/year"  . Drug use: No  . Sexual activity: Not on file  Lifestyle  . Physical activity:    Days per week: Not on file    Minutes per session: Not on file  . Stress: Not on file  Relationships  . Social connections:    Talks on phone: Not on file    Gets together: Not on file    Attends religious service: Not on file    Active member of club or organization: Not on file    Attends meetings of clubs or organizations: Not on file    Relationship status: Not on file  Other Topics Concern  . Not on file  Social History Narrative  . Not on file   Past Surgical History:  Procedure Laterality Date  . CATARACT EXTRACTION W/ INTRAOCULAR LENS  IMPLANT, BILATERAL Bilateral YRS AGO  . CORONARY ANGIOPLASTY WITH STENT PLACEMENT  05/1996   PCI of LAD; "I've got a total of 4 stents" (05/21/2017)  . [place]  .  LOWER EXTREMITY ANGIOGRAM Bilateral 09/20/2011   Procedure: LOWER EXTREMITY ANGIOGRAM;  Surgeon: Wellington Hampshire, MD;  Location: [facility]ATH [facility];  Service: Cardiovascular;  Laterality: Bilateral;  . TRANSURETHRAL RESECTION OF PROSTATE  08/11/2011   Procedure: TRANSURETHRAL RESECTION OF THE PROSTATE WITH GYRUS INSTRUMENTS;  Surgeon: Fredricka Bonine, MD;  [facility];  Service: Urology;  Laterality: N/A;     . VASECTOMY  1968   Past Medical History:  Diagnosis Date  . AAA (abdominal aortic aneurysm) ([place])    ultrasound 07/2011:  2.7 x 2.8 cm  . Acute myocardial infarction, unspecified site, episode of care unspecified 1998  . Aortic stenosis    echo 4/12: normal LVF, mild LAE, no significant AS  . Arthritis    "all over" (05/21/2017)  . CAD (coronary artery disease)    s/p PCI of LAD 1998;  s/p DES to RCA  05/2009;  Myoview 4/13: low risk, no ischemia, EF 56%  . CAP (community acquired pneumonia) 05/21/2017  . COPD (chronic obstructive pulmonary disease) ([place])   . DJD (degenerative joint disease), lumbar 07/06/2011  . Esophageal reflux   . HLD (hyperlipidemia)   . HTN (hypertension)   . PAD (peripheral artery disease) ([place])    s/p L CIA stent 09/2011 ([place])  . Pneumonia    "I've always had pneumonia; since I was 27 months old" (05/21/2017)  . Renal artery stenosis (HCC)    s/p bilat RA stents  . Urine incontinence    There were no vitals taken for this visit.  Opioid Risk Score:   Fall Risk Score:  `1  Depression screen PHQ 2/9  Depression screen [facility] 2/9 02/12/2018 01/09/2018 11/30/2017 08/06/2017 07/05/2017 05/01/2017 03/29/2017  Decreased Interest 0 0 0 0 0 0 0  Down, Depressed, Hopeless 0 0 0 0 0 0 0  PHQ - 2 Score 0 0 0 0 0 0 0  Altered sleeping - - - - - - -  Tired, decreased energy - - - - - - -  Change in appetite - - - - - - -  Feeling bad or failure about yourself  - - - - - - -  Trouble concentrating - - - - - - -  Moving slowly or fidgety/restless - - - - - - -  Suicidal thoughts - - - - - - -  PHQ-9 Score - - - - - - -     Review of Systems  Constitutional: Negative.   HENT: Negative.   Eyes: Negative.   Respiratory: Negative.   Cardiovascular: Negative.   Gastrointestinal: Negative.   Endocrine: Negative.   Genitourinary: Negative.   Musculoskeletal: Positive for arthralgias, gait problem and myalgias.  Skin: Negative.   Allergic/Immunologic: Negative.   Hematological: Negative.   Psychiatric/Behavioral: Negative.   All other systems reviewed and are negative.      Objective:   Physical Exam  Constitutional: He is oriented to person, place, and time. He appears well-developed and well-nourished.  HENT:  Head: Normocephalic and atraumatic.  Neck: Normal range of motion. Neck supple.  Cardiovascular: Normal rate and regular rhythm.  Pulmonary/Chest: Effort  normal and breath sounds normal.  Musculoskeletal:  Normal Muscle Bulk and Muscle Testing Reveals: Upper Extremities:  Right Full ROM and Muscle Strength 5/5 Left: Decreased ROM 90 Degrees and Muscle Strength 3/5 Lumbar Dressing intact. Lower Extremities: Right: Decreased ROM and Muscle Strength 5/5 Right Lower Extremity Flexion Produces Pain into Extremity and Patella Narrow BasedGait   Neurological: He is alert  and oriented to person, place, and time.  Skin: Skin is warm and dry.  Psychiatric: He has a normal mood and affect. His behavior is normal.  Nursing note and vitals reviewed.         Assessment & Plan:  1. Patellofemoral arthritis. Continue Voltaren gel, and continue with exercise regimen. 03/12/2018 2 Chronic Low Back Pain/ Lumbar Stenosis:Continue HEP as Tolerated and Continue to Monitor.Continue current medication regimen.03/12/2018 3. Chronic Pain: Refilled: Hydrocodone 5/325 mg one tablet three times a day #90.03/12/2018 We will continue the opioid monitoring program, this consists of regular clinic visits, examinations, urine drug screen, pill counts as well as use of [place] Controlled Substance reporting System. 4. Muscle Spasm: Continuecurrent medication regimen withFlexeril. 03/12/2018 5. Primary Osteoarthritis of Right Hip: Continue to Alternate with Ice and Heat Therapy. Continue to Monitor.  6. Idiopathic Peripheral Neuropathy: Reffuses Gabapentin or Amitriptylline at this time. Continue to Monitor.   20 minutes of face to face patient care time was spent during this visit. All questions were encouraged and answered.  F/U in 1 month

## 2018-03-13 ENCOUNTER — Other Ambulatory Visit: Payer: Self-pay | Admitting: Physician Assistant

## 2018-03-21 ENCOUNTER — Telehealth: Payer: Self-pay | Admitting: Cardiology

## 2018-03-21 MED ORDER — ROSUVASTATIN CALCIUM 40 MG PO TABS: 40.0000 mg | ORAL_TABLET | Freq: Every day | ORAL | 3 refills | Status: DC

## 2018-03-21 NOTE — Telephone Encounter (Signed)
New Message    *STAT* If patient is at the pharmacy, call can be transferred to refill team.   1. Which medications need to be refilled? (please list name of each medication and dose if known) rosuvastatin (CRESTOR) 40 MG tablet  2. Which pharmacy/location (including street and city if local pharmacy) is medication to be sent to?  [facility] [place], [place]  3. Do they need a 30 day or 90 day supply? [place]

## 2018-03-21 NOTE — Telephone Encounter (Signed)
90 day Crestor refill sent to pharmacy.

## 2018-04-08 DIAGNOSIS — Z85828 Personal history of other malignant neoplasm of skin: Secondary | ICD-10-CM | POA: Diagnosis not present

## 2018-04-08 DIAGNOSIS — Z483 Aftercare following surgery for neoplasm: Secondary | ICD-10-CM | POA: Diagnosis not present

## 2018-04-11 ENCOUNTER — Encounter: Payer: Medicare Other | Attending: Registered Nurse | Admitting: Registered Nurse

## 2018-04-11 ENCOUNTER — Encounter: Payer: Self-pay | Admitting: Registered Nurse

## 2018-04-11 ENCOUNTER — Other Ambulatory Visit: Payer: Self-pay

## 2018-04-11 VITALS — BP 124/66 | HR 72 | Wt 151.2 lb

## 2018-04-11 DIAGNOSIS — M25561 Pain in right knee: Secondary | ICD-10-CM

## 2018-04-11 DIAGNOSIS — M62838 Other muscle spasm: Secondary | ICD-10-CM | POA: Diagnosis not present

## 2018-04-11 DIAGNOSIS — Z5181 Encounter for therapeutic drug level monitoring: Secondary | ICD-10-CM | POA: Diagnosis not present

## 2018-04-11 DIAGNOSIS — M1711 Unilateral primary osteoarthritis, right knee: Secondary | ICD-10-CM | POA: Diagnosis not present

## 2018-04-11 DIAGNOSIS — M1611 Unilateral primary osteoarthritis, right hip: Secondary | ICD-10-CM | POA: Diagnosis not present

## 2018-04-11 DIAGNOSIS — G609 Hereditary and idiopathic neuropathy, unspecified: Secondary | ICD-10-CM

## 2018-04-11 DIAGNOSIS — G894 Chronic pain syndrome: Secondary | ICD-10-CM | POA: Diagnosis not present

## 2018-04-11 DIAGNOSIS — Z79899 Other long term (current) drug therapy: Secondary | ICD-10-CM

## 2018-04-11 DIAGNOSIS — M48062 Spinal stenosis, lumbar region with neurogenic claudication: Secondary | ICD-10-CM

## 2018-04-11 MED ORDER — HYDROCODONE-ACETAMINOPHEN 5-325 MG PO TABS: 1.0000 | ORAL_TABLET | Freq: Three times a day (TID) | ORAL | 0 refills | Status: DC

## 2018-04-11 NOTE — Progress Notes (Signed)
Subjective:    Patient ID: Kristopher [name], male    DOB: 09-Nov-1930, 85 y.o.   MRN: [id number]  HPI: Kristopher [name] is a 85 y.o. male who returns for follow up appointment for chronic pain and medication refill. He states his pain is located in his right knee pain and reports right ankle pain radiating upward into his right lower extremity and right hip pain. He rates his pain 2. His current exercise regime is walking, performing outside chores and performing stretching exercises.  [name] equivalent is 15.00 MME. Last Oral Swab was Performed 11/08/2017, it was consistent.   Pain Inventory Average Pain 3 Pain Right Now 2 My pain is burning, dull, stabbing, tingling and aching  In the last 24 hours, has pain interfered with the following? General activity 3 Relation with others 3 Enjoyment of life 3 What TIME of day is your pain at its worst? evening Sleep (in general) Fair  Pain is worse with: walking and standing Pain improves with: medication Relief from Meds: 2  Mobility walk with assistance use a cane how many minutes can you walk? 10 ability to climb steps?  yes do you drive?  yes  Function retired  Neuro/Psych No problems in this area  Prior Studies Any changes since last visit?  no  Physicians involved in your care Any changes since last visit?  no   Family History  Problem Relation Age of Onset  . Heart disease Mother   . Heart disease Father    Social History   Socioeconomic History  . Marital status: Widowed    Spouse name: Not on file  . Number of children: Not on file  . Years of education: GED  . Highest education level: Not on file  Occupational History  . Occupation: retired  Scientific laboratory technician  . Financial resource strain: Not on file  . Food insecurity:    Worry: Not on file    Inability: Not on file  . Transportation needs:    Medical: Not on file    Non-medical: Not on file  Tobacco Use  . Smoking status: Former Smoker   Packs/day: 3.00    Years: 38.00    Pack years: 114.00    Last attempt to quit: 04/10/1988    Years since quitting: 30.0  . Smokeless tobacco: Never Used  Substance and Sexual Activity  . Alcohol use: Yes    Comment: 05/21/2017 "might drink 6 beers/year"  . Drug use: No  . Sexual activity: Not on file  Lifestyle  . Physical activity:    Days per week: Not on file    Minutes per session: Not on file  . Stress: Not on file  Relationships  . Social connections:    Talks on phone: Not on file    Gets together: Not on file    Attends religious service: Not on file    Active member of club or organization: Not on file    Attends meetings of clubs or organizations: Not on file    Relationship status: Not on file  Other Topics Concern  . Not on file  Social History Narrative  . Not on file   Past Surgical History:  Procedure Laterality Date  . CATARACT EXTRACTION W/ INTRAOCULAR LENS  IMPLANT, BILATERAL Bilateral YRS AGO  . CORONARY ANGIOPLASTY WITH STENT PLACEMENT  05/1996   PCI of LAD; "I've got a total of 4 stents" (05/21/2017)  . [place]  . LOWER EXTREMITY  ANGIOGRAM Bilateral 09/20/2011   Procedure: LOWER EXTREMITY ANGIOGRAM;  Surgeon: Wellington Hampshire, MD;  Location: [place] CATH [facility];  Service: Cardiovascular;  Laterality: Bilateral;  . TRANSURETHRAL RESECTION OF PROSTATE  08/11/2011   Procedure: TRANSURETHRAL RESECTION OF THE PROSTATE WITH GYRUS INSTRUMENTS;  Surgeon: Fredricka Bonine, MD;  Location: [facility];  Service: Urology;  Laterality: N/A;     . VASECTOMY  1968   Past Medical History:  Diagnosis Date  . AAA (abdominal aortic aneurysm) ([place])    ultrasound 07/2011:  2.7 x 2.8 cm  . Acute myocardial infarction, unspecified site, episode of care unspecified 1998  . Aortic stenosis    echo 4/12: normal LVF, mild LAE, no significant AS  . Arthritis    "all over" (05/21/2017)  . CAD (coronary artery disease)    s/p PCI of LAD 1998;  s/p DES to RCA 05/2009;   Myoview 4/13: low risk, no ischemia, EF 56%  . CAP (community acquired pneumonia) 05/21/2017  . COPD (chronic obstructive pulmonary disease) ([place])   . DJD (degenerative joint disease), lumbar 07/06/2011  . Esophageal reflux   . HLD (hyperlipidemia)   . HTN (hypertension)   . PAD (peripheral artery disease) ([place])    s/p L CIA stent 09/2011 ([place])  . Pneumonia    "I've always had pneumonia; since I was 43 months old" (05/21/2017)  . Renal artery stenosis (HCC)    s/p bilat RA stents  . Urine incontinence    There were no vitals taken for this visit.  Opioid Risk Score:   Fall Risk Score:  `1  Depression screen PHQ 2/9  Depression screen [facility] 2/9 02/12/2018 01/09/2018 11/30/2017 08/06/2017 07/05/2017 05/01/2017 03/29/2017  Decreased Interest 0 0 0 0 0 0 0  Down, Depressed, Hopeless 0 0 0 0 0 0 0  PHQ - 2 Score 0 0 0 0 0 0 0  Altered sleeping - - - - - - -  Tired, decreased energy - - - - - - -  Change in appetite - - - - - - -  Feeling bad or failure about yourself  - - - - - - -  Trouble concentrating - - - - - - -  Moving slowly or fidgety/restless - - - - - - -  Suicidal thoughts - - - - - - -  PHQ-9 Score - - - - - - -     Review of Systems  Constitutional: Negative.   HENT: Negative.   Eyes: Negative.   Respiratory: Negative.   Cardiovascular: Negative.   Gastrointestinal: Negative.   Endocrine: Negative.   Genitourinary: Negative.   Musculoskeletal: Positive for arthralgias, back pain and gait problem.  Skin: Negative.   Allergic/Immunologic: Negative.   Hematological: Negative.   Psychiatric/Behavioral: Negative.   All other systems reviewed and are negative.      Objective:   Physical Exam Vitals signs and nursing note reviewed.  Constitutional:      Appearance: Normal appearance.  Neck:     Musculoskeletal: Normal range of motion and neck supple.  Cardiovascular:     Rate and Rhythm: Normal rate and regular rhythm.     Pulses: Normal pulses.     Heart  sounds: Normal heart sounds.  Pulmonary:     Effort: Pulmonary effort is normal.     Breath sounds: Normal breath sounds.  Musculoskeletal:     Comments: Normal Muscle Bulk and Muscle Testing Reveals:  Upper Extremities: Full ROM and Muscle Strength 5/5  Thoracic  Paraspinal Tenderness: T-3-T-4 Mainly Right Side Lower Extremities: Full ROM and Muscle Strength 5/5 Arises from Table with ease Narrow Based Gait   Skin:    General: Skin is warm and dry.  Neurological:     Mental Status: He is alert and oriented to person, place, and time.  Psychiatric:        Mood and Affect: Mood normal.        Behavior: Behavior normal.           Assessment & Plan:  1. Patellofemoral arthritis. Continue Voltaren gel, and continue with exercise regimen.04/11/2018 2 Chronic Low Back Pain/ Lumbar Stenosis:Continue HEP as Tolerated and Continue to Monitor.Continue current medication regimen.04/11/2018 3. Chronic Pain: Refilled: Hydrocodone 5/325 mg one tablet three times a day #90.04/11/2018. We will continue the opioid monitoring program, this consists of regular clinic visits, examinations, urine drug screen, pill counts as well as use of [place] Controlled Substance reporting System. 4. Muscle Spasm: Continuecurrent medication regimen withFlexeril. 04/11/2018 5. Primary Osteoarthritis of Right Hip: Continue to Alternate with Ice and Heat Therapy. Continue to Monitor. 04/11/2018 6. Idiopathic Peripheral Neuropathy: Reffuses Gabapentin or Amitriptylline at this time. Continue to Monitor. 04/11/2018  20 minutes of face to face patient care time was spent during this visit. All questions were encouraged and answered.  F/U in 1 month

## 2018-04-29 ENCOUNTER — Other Ambulatory Visit: Payer: Self-pay | Admitting: Internal Medicine

## 2018-05-14 ENCOUNTER — Encounter: Payer: Self-pay | Admitting: Registered Nurse

## 2018-05-14 ENCOUNTER — Encounter: Payer: Medicare Other | Attending: Registered Nurse | Admitting: Registered Nurse

## 2018-05-14 VITALS — BP 104/69 | HR 97 | Resp 16 | Ht 67.0 in | Wt 150.0 lb

## 2018-05-14 DIAGNOSIS — M62838 Other muscle spasm: Secondary | ICD-10-CM

## 2018-05-14 DIAGNOSIS — M25561 Pain in right knee: Secondary | ICD-10-CM | POA: Diagnosis not present

## 2018-05-14 DIAGNOSIS — M1711 Unilateral primary osteoarthritis, right knee: Secondary | ICD-10-CM | POA: Insufficient documentation

## 2018-05-14 DIAGNOSIS — G894 Chronic pain syndrome: Secondary | ICD-10-CM | POA: Diagnosis not present

## 2018-05-14 DIAGNOSIS — Z5181 Encounter for therapeutic drug level monitoring: Secondary | ICD-10-CM | POA: Diagnosis not present

## 2018-05-14 DIAGNOSIS — Z79899 Other long term (current) drug therapy: Secondary | ICD-10-CM | POA: Diagnosis not present

## 2018-05-14 DIAGNOSIS — M48062 Spinal stenosis, lumbar region with neurogenic claudication: Secondary | ICD-10-CM | POA: Diagnosis not present

## 2018-05-14 DIAGNOSIS — G609 Hereditary and idiopathic neuropathy, unspecified: Secondary | ICD-10-CM

## 2018-05-14 DIAGNOSIS — M1611 Unilateral primary osteoarthritis, right hip: Secondary | ICD-10-CM | POA: Diagnosis not present

## 2018-05-14 MED ORDER — HYDROCODONE-ACETAMINOPHEN 5-325 MG PO TABS: 1.0000 | ORAL_TABLET | Freq: Three times a day (TID) | ORAL | 0 refills | Status: DC

## 2018-05-14 NOTE — Progress Notes (Signed)
Subjective:    Patient ID: Kristopher [name], male    DOB: [birth year], 85 y.o.   MRN: [id number]  HPI: Kristopher [name] is a 85 y.o. male who returns for follow up appointment for chronic pain and medication refill. He states his pain is located in his right knee and right lower extremity with tingling and burning. He rates his pain 2. His current exercise regime is walking and performing stretching exercises.  [name] Morphine equivalent is 15.00 MME.  Last Oral Swab was Performed on 11/08/2017, it was consistent.   Pain Inventory Average Pain 3 Pain Right Now 2 My pain is dull and aching  In the last 24 hours, has pain interfered with the following? General activity 3 Relation with others 3 Enjoyment of life 3 What TIME of day is your pain at its worst? evening Sleep (in general) Fair  Pain is worse with: bending and some activites Pain improves with: rest and medication Relief from Meds: 7  Mobility use a cane ability to climb steps?  yes do you drive?  yes  Function not employed: date last employed .  Neuro/Psych No problems in this area  Prior Studies Any changes since last visit?  no  Physicians involved in your care Any changes since last visit?  no   Family History  Problem Relation Age of Onset  . Heart disease Mother   . Heart disease Father    Social History   Socioeconomic History  . Marital status: Widowed    Spouse name: Not on file  . Number of children: Not on file  . Years of education: GED  . Highest education level: Not on file  Occupational History  . Occupation: retired  Scientific laboratory technician  . Financial resource strain: Not on file  . Food insecurity:    Worry: Not on file    Inability: Not on file  . Transportation needs:    Medical: Not on file    Non-medical: Not on file  Tobacco Use  . Smoking status: Former Smoker    Packs/day: 3.00    Years: 38.00    Pack years: 114.00    Last attempt to quit: 04/10/1988    Years since quitting:  30.1  . Smokeless tobacco: Never Used  Substance and Sexual Activity  . Alcohol use: Yes    Comment: 05/21/2017 "might drink 6 beers/year"  . Drug use: No  . Sexual activity: Not on file  Lifestyle  . Physical activity:    Days per week: Not on file    Minutes per session: Not on file  . Stress: Not on file  Relationships  . Social connections:    Talks on phone: Not on file    Gets together: Not on file    Attends religious service: Not on file    Active member of club or organization: Not on file    Attends meetings of clubs or organizations: Not on file    Relationship status: Not on file  Other Topics Concern  . Not on file  Social History Narrative  . Not on file   Past Surgical History:  Procedure Laterality Date  . CATARACT EXTRACTION W/ INTRAOCULAR LENS  IMPLANT, BILATERAL Bilateral YRS AGO  . CORONARY ANGIOPLASTY WITH STENT PLACEMENT  05/1996   PCI of LAD; "I've got a total of 4 stents" (05/21/2017)  . Kristopher [name]  . LOWER EXTREMITY ANGIOGRAM Bilateral 09/20/2011   Procedure: LOWER EXTREMITY ANGIOGRAM;  Surgeon: Kristopher Clause  [name] Anon, MD;  Location: [place] CATH LAB;  Service: Cardiovascular;  Laterality: Bilateral;  . TRANSURETHRAL RESECTION OF PROSTATE  08/11/2011   Procedure: TRANSURETHRAL RESECTION OF THE PROSTATE WITH GYRUS INSTRUMENTS;  Surgeon: Kristopher Bonine, MD;  Location: [facility];  Service: Urology;  Laterality: N/A;     . VASECTOMY  1968   Past Medical History:  Diagnosis Date  . AAA (abdominal aortic aneurysm) ([place])    ultrasound 07/2011:  2.7 x 2.8 cm  . Acute myocardial infarction, unspecified site, episode of care unspecified 1998  . Aortic stenosis    echo 4/12: normal LVF, mild LAE, no significant AS  . Arthritis    "all over" (05/21/2017)  . CAD (coronary artery disease)    s/p PCI of LAD 1998;  s/p DES to RCA 05/2009;  Myoview 4/13: low risk, no ischemia, EF 56%  . CAP (community acquired pneumonia) 05/21/2017  . COPD (chronic  obstructive pulmonary disease) ([place])   . DJD (degenerative joint disease), lumbar 07/06/2011  . Esophageal reflux   . HLD (hyperlipidemia)   . HTN (hypertension)   . PAD (peripheral artery disease) ([place])    s/p L CIA stent 09/2011 (Kristopher [name])  . Pneumonia    "I've always had pneumonia; since I was 73 months old" (05/21/2017)  . Renal artery stenosis (HCC)    s/p bilat RA stents  . Urine incontinence    BP 104/69   Pulse 97   Resp 16   Ht 5\' 7"  (1.702 m)   Wt 150 lb (68 kg)   BMI 23.49 kg/m   Opioid Risk Score:   Fall Risk Score:  `1  Depression screen PHQ 2/9  Depression screen [facility] 2/9 04/11/2018 02/12/2018 01/09/2018 11/30/2017 08/06/2017 07/05/2017 05/01/2017  Decreased Interest 0 0 0 0 0 0 0  Down, Depressed, Hopeless 0 0 0 0 0 0 0  PHQ - 2 Score 0 0 0 0 0 0 0  Altered sleeping - - - - - - -  Tired, decreased energy - - - - - - -  Change in appetite - - - - - - -  Feeling bad or failure about yourself  - - - - - - -  Trouble concentrating - - - - - - -  Moving slowly or fidgety/restless - - - - - - -  Suicidal thoughts - - - - - - -  PHQ-9 Score - - - - - - -     Review of Systems  Constitutional: Negative.   HENT: Negative.   Eyes: Negative.   Respiratory: Negative.   Cardiovascular: Negative.   Gastrointestinal: Negative.   Endocrine: Negative.   Genitourinary: Negative.   Musculoskeletal: Positive for arthralgias, gait problem, joint swelling and myalgias.  Skin: Negative.   Allergic/Immunologic: Negative.   Hematological: Negative.   Psychiatric/Behavioral: Negative.   All other systems reviewed and are negative.      Objective:   Physical Exam Vitals signs and nursing note reviewed.  Constitutional:      Appearance: Normal appearance.  Neck:     Musculoskeletal: Normal range of motion and neck supple.  Cardiovascular:     Rate and Rhythm: Normal rate and regular rhythm.     Pulses: Normal pulses.     Heart sounds: Normal heart sounds.  Pulmonary:      Effort: Pulmonary effort is normal.     Breath sounds: Normal breath sounds.  Musculoskeletal:     Comments: Normal Muscle Bulk and Muscle Testing Reveals:  Upper Extremities: Full ROM and Muscle Strength 5/5 Back without spinal tenderness noted Lower Extremities: Right decreased ROM and Muscle Strength 5/5 Left: Full ROM and Muscle Strength 5/5 Arises from Table slowly using cane for support Antalgic Gait   Skin:    General: Skin is warm and dry.  Neurological:     General: No focal deficit present.     Mental Status: He is alert and oriented to person, place, and time.  Psychiatric:        Mood and Affect: Mood normal.        Behavior: Behavior normal.           Assessment & Plan:  1. Patellofemoral arthritis. Continue Voltaren gel, and continue with exercise regimen.05/14/2018 2 Chronic Low Back Pain/ Lumbar Stenosis:Continue HEP as Tolerated and Continue to Monitor.Continue current medication regimen.05/14/2018 3. Chronic Pain: Refilled: Hydrocodone 5/325 mg one tablet three times a day #90.05/14/2018. We will continue the opioid monitoring program, this consists of regular clinic visits, examinations, urine drug screen, pill counts as well as use of [place] Controlled Substance reporting System. 4. Muscle Spasm: Continuecurrent medication regimen withFlexeril. 05/14/2018 5. Primary Osteoarthritis of Right Hip: Continue to Alternate with Ice and Heat Therapy. Continue to Monitor. 05/14/2018 6. Idiopathic Peripheral Neuropathy: Reffuses Gabapentin or Amitriptylline at this time. Continue to Monitor.05/14/2018  20 minutes of face to face patient care time was spent during this visit. All questions were encouraged and answered.  F/U in 1 month

## 2018-06-12 ENCOUNTER — Encounter: Payer: Self-pay | Admitting: Registered Nurse

## 2018-06-12 ENCOUNTER — Encounter: Payer: Medicare Other | Attending: Registered Nurse | Admitting: Registered Nurse

## 2018-06-12 VITALS — BP 105/72 | HR 75 | Resp 14 | Ht 67.5 in | Wt 150.0 lb

## 2018-06-12 DIAGNOSIS — Z79899 Other long term (current) drug therapy: Secondary | ICD-10-CM | POA: Diagnosis not present

## 2018-06-12 DIAGNOSIS — G894 Chronic pain syndrome: Secondary | ICD-10-CM | POA: Diagnosis not present

## 2018-06-12 DIAGNOSIS — M1611 Unilateral primary osteoarthritis, right hip: Secondary | ICD-10-CM | POA: Insufficient documentation

## 2018-06-12 DIAGNOSIS — Z5181 Encounter for therapeutic drug level monitoring: Secondary | ICD-10-CM | POA: Diagnosis not present

## 2018-06-12 DIAGNOSIS — M48062 Spinal stenosis, lumbar region with neurogenic claudication: Secondary | ICD-10-CM | POA: Diagnosis not present

## 2018-06-12 DIAGNOSIS — G609 Hereditary and idiopathic neuropathy, unspecified: Secondary | ICD-10-CM

## 2018-06-12 DIAGNOSIS — M25561 Pain in right knee: Secondary | ICD-10-CM | POA: Diagnosis not present

## 2018-06-12 DIAGNOSIS — M1711 Unilateral primary osteoarthritis, right knee: Secondary | ICD-10-CM | POA: Diagnosis not present

## 2018-06-12 DIAGNOSIS — M62838 Other muscle spasm: Secondary | ICD-10-CM

## 2018-06-12 MED ORDER — HYDROCODONE-ACETAMINOPHEN 5-325 MG PO TABS: 1.0000 | ORAL_TABLET | Freq: Three times a day (TID) | ORAL | 0 refills | Status: DC

## 2018-06-12 NOTE — Progress Notes (Signed)
Subjective:    Patient ID: Kristopher [name], male    DOB: August 02, 1930, 85 y.o.   MRN: [id number]  HPI: Kristopher [name] is a 85 y.o. male who returns for follow up appointment for chronic pain and medication refill. He states his  pain is located in his lower back, right hip and right knee pain. He. rates his pain 4. His current exercise regime is walking and performing stretching exercises.  Kristopher [name] Morphine equivalent is 15.00 MME.  Last Oral Swab was Performed on 11/08/2017, it ws consistent.    Pain Inventory Average Pain 3 Pain Right Now 4 My pain is dull  In the last 24 hours, has pain interfered with the following? General activity 3 Relation with others 3 Enjoyment of life 3 What TIME of day is your pain at its worst? night Sleep (in general) Fair  Pain is worse with: bending Pain improves with: medication Relief from Meds: 8  Mobility walk with assistance use a cane how many minutes can you walk? 10 ability to climb steps?  yes do you drive?  yes Do you have any goals in this area?  yes  Function retired Do you have any goals in this area?  no  Neuro/Psych trouble walking  Prior Studies Any changes since last visit?  no  Physicians involved in your care Any changes since last visit?  no   Family History  Problem Relation Age of Onset  . Heart disease Mother   . Heart disease Father    Social History   Socioeconomic History  . Marital status: Widowed    Spouse name: Not on file  . Number of children: Not on file  . Years of education: GED  . Highest education level: Not on file  Occupational History  . Occupation: retired  Scientific laboratory technician  . Financial resource strain: Not on file  . Food insecurity:    Worry: Not on file    Inability: Not on file  . Transportation needs:    Medical: Not on file    Non-medical: Not on file  Tobacco Use  . Smoking status: Former Smoker    Packs/day: 3.00    Years: 38.00    Pack years: 114.00    Last attempt  to quit: 04/10/1988    Years since quitting: 30.1  . Smokeless tobacco: Never Used  Substance and Sexual Activity  . Alcohol use: Yes    Comment: 05/21/2017 "might drink 6 beers/year"  . Drug use: No  . Sexual activity: Not on file  Lifestyle  . Physical activity:    Days per week: Not on file    Minutes per session: Not on file  . Stress: Not on file  Relationships  . Social connections:    Talks on phone: Not on file    Gets together: Not on file    Attends religious service: Not on file    Active member of club or organization: Not on file    Attends meetings of clubs or organizations: Not on file    Relationship status: Not on file  Other Topics Concern  . Not on file  Social History Narrative  . Not on file   Past Surgical History:  Procedure Laterality Date  . CATARACT EXTRACTION W/ INTRAOCULAR LENS  IMPLANT, BILATERAL Bilateral YRS AGO  . CORONARY ANGIOPLASTY WITH STENT PLACEMENT  05/1996   PCI of LAD; "I've got a total of 4 stents" (05/21/2017)  . [place]  .  LOWER EXTREMITY ANGIOGRAM Bilateral 09/20/2011   Procedure: LOWER EXTREMITY ANGIOGRAM;  Surgeon: Wellington Hampshire, MD;  Location: [place] CATH LAB;  Service: Cardiovascular;  Laterality: Bilateral;  . TRANSURETHRAL RESECTION OF PROSTATE  08/11/2011   Procedure: TRANSURETHRAL RESECTION OF THE PROSTATE WITH GYRUS INSTRUMENTS;  Surgeon: Fredricka Bonine, MD;  Location[facility];  Service: Urology;  Laterality: N/A;     . VASECTOMY  1968   Past Medical History:  Diagnosis Date  . AAA (abdominal aortic aneurysm) ([place])    ultrasound 07/2011:  2.7 x 2.8 cm  . Acute myocardial infarction, unspecified site, episode of care unspecified 1998  . Aortic stenosis    echo 4/12: normal LVF, mild LAE, no significant AS  . Arthritis    "all over" (05/21/2017)  . CAD (coronary artery disease)    s/p PCI of LAD 1998;  s/p DES to RCA 05/2009;  Myoview 4/13: low risk, no ischemia, EF 56%  . CAP (community acquired  pneumonia) 05/21/2017  . COPD (chronic obstructive pulmonary disease) ([place])   . DJD (degenerative joint disease), lumbar 07/06/2011  . Esophageal reflux   . HLD (hyperlipidemia)   . HTN (hypertension)   . PAD (peripheral artery disease) ([place])    s/p L CIA stent 09/2011 ([place])  . Pneumonia    "I've always had pneumonia; since I was 1 months old" (05/21/2017)  . Renal artery stenosis (HCC)    s/p bilat RA stents  . Urine incontinence    BP 105/72   Pulse 75   Resp 14   Ht 5' 7.5" (1.715 m)   Wt 150 lb (68 kg)   SpO2 94%   BMI 23.15 kg/m   Opioid Risk Score:   Fall Risk Score:  `1  Depression screen PHQ 2/9  Depression screen [facility] 2/9 04/11/2018 02/12/2018 01/09/2018 11/30/2017 08/06/2017 07/05/2017 05/01/2017  Decreased Interest 0 0 0 0 0 0 0  Down, Depressed, Hopeless 0 0 0 0 0 0 0  PHQ - 2 Score 0 0 0 0 0 0 0  Altered sleeping - - - - - - -  Tired, decreased energy - - - - - - -  Change in appetite - - - - - - -  Feeling bad or failure about yourself  - - - - - - -  Trouble concentrating - - - - - - -  Moving slowly or fidgety/restless - - - - - - -  Suicidal thoughts - - - - - - -  PHQ-9 Score - - - - - - -    Review of Systems  Constitutional: Negative.   HENT: Negative.   Eyes: Negative.   Respiratory: Negative.   Cardiovascular: Negative.   Gastrointestinal: Negative.   Endocrine: Negative.   Genitourinary: Negative.   Musculoskeletal: Positive for arthralgias and gait problem.  Allergic/Immunologic: Negative.   Hematological: Negative.   All other systems reviewed and are negative.      Objective:   Physical Exam Nursing note reviewed. Exam conducted with a chaperone present.  Constitutional:      Appearance: Normal appearance.  Neck:     Musculoskeletal: Normal range of motion and neck supple.  Cardiovascular:     Rate and Rhythm: Normal rate and regular rhythm.     Pulses: Normal pulses.     Heart sounds: Normal heart sounds.  Pulmonary:     Effort:  Pulmonary effort is normal.     Breath sounds: Normal breath sounds.  Musculoskeletal:  Comments: Normal Muscle Bulk and Muscle Testing Reveals:  Upper Extremities: Full ROM and Muscle Strength 5/5  Lumbar Paraspinal Tenderness: L-4-L-5 Lower Extremities: Right: Decreased ROM and Muscle Strength 5/5 Right Lower Extremity Flexion Produces Pain into Patella Left: Full ROM and Muscle Strength 5/5  Arises from Table with ease Narrow Based Gait   Skin:    General: Skin is warm and dry.  Neurological:     Mental Status: He is alert and oriented to person, place, and time.  Psychiatric:        Mood and Affect: Mood normal.        Behavior: Behavior normal.           Assessment & Plan:  1. Patellofemoral arthritis. Continue Voltaren gel, and continue with exercise regimen.06/12/2018 2 Chronic Low Back Pain/ Lumbar Stenosis:Continue HEP as Tolerated and Continue to Monitor.Continue current medication regimen.06/12/2018 3. Chronic Pain: Refilled: Hydrocodone 5/325 mg one tablet three times a day #90.06/12/2018. We will continue the opioid monitoring program, this consists of regular clinic visits, examinations, urine drug screen, pill counts as well as use of [place] Controlled Substance reporting System. 4. Muscle Spasm: Continuecurrent medication regimen withFlexeril. 06/12/2018 5. Primary Osteoarthritis of Right Hip: Continue to Alternate with Ice and Heat Therapy. Continue to Monitor. 06/12/2018 6. Idiopathic Peripheral Neuropathy: Reffuses Gabapentin or Amitriptylline at this time. Continue to Monitor.06/12/2018  20 minutes of face to face patient care time was spent during this visit. All questions were encouraged and answered.  F/U in 1 month

## 2018-06-17 NOTE — Progress Notes (Signed)
HPI: FU CAD, s/p PCI of his LAD performed in February 1998. He also has PVD and has had prior bilateral renal artery stenting. Cardiac catheterization in February 2011: EF 60% and no renal artery stenosis, RCA 90% but no obstructive disease in the left system. He had PCI of his right coronary artery with a drug-eluting stent at that time. Echocardiogram in April of 2012 revealed normal LV function, mild LAE, no significant AS. LE angiogram 09/20/11: Small infrarenal aortic aneurysm, no renal artery stenosis, ostial right CIA 60%, proximal left CIA severely calcified with 80-90%, left EIA occluded and filled by collats. He underwent intervention with angioplasty and stenting to the left CIA. H/O asymptomatic Mobitz 1. Nuclear study March 2018 showed ejection fraction 56%, apical thinning and no ischemia.  Abdominal ultrasound April 2019 showed 2.6 cm abdominal aortic aneurysm.  Chest x-ray February 2019 showed densities at both bases and follow-up recommended 3 to 4 weeks.  Since last seen,  he has some dyspnea on exertion but no orthopnea PND, pedal edema or exertional chest pain.  No syncope.  Current Outpatient Medications  Medication Sig Dispense Refill  . Ascorbic Acid (VITAMIN C) 500 MG tablet Take 500 mg by mouth 2 (two) times daily.     Marland Kitchen aspirin 325 MG EC tablet Take 325 mg by mouth daily.    Marland Kitchen BEE POLLEN PO Take by mouth.    Marland Kitchen Co-Enzyme Q-10 100 MG CAPS Take 1 capsule by mouth every evening.     . cyclobenzaprine (FLEXERIL) 5 MG tablet Take 1 tablet (5 mg total) by mouth at bedtime as needed for muscle spasms. 30 tablet 2  . diclofenac sodium (VOLTAREN) 1 % GEL APPLY 2 GRAMS TOPICALLY FOUR TIMES A DAY AS NEEDED. 700 g 4  . Docusate Calcium (STOOL SOFTENER PO) Take 2 capsules by mouth daily.    Marland Kitchen HYDROcodone-acetaminophen (NORCO/VICODIN) 5-325 MG tablet Take 1 tablet by mouth 3 (three) times daily before meals. 90 tablet 0  . Multiple Vitamin (MULTIVITAMIN) capsule Take 1 capsule by  mouth daily.     . Multiple Vitamins-Minerals (PRESERVISION AREDS PO) Take 2 tablets by mouth daily.    . nitroGLYCERIN (NITROSTAT) 0.4 MG SL tablet Place 1 tablet (0.4 mg total) under the tongue every 5 (five) minutes as needed for chest pain. 90 tablet 3  . Omega-3 Fatty Acids (OMEGA-3 PLUS PO) Take by mouth.    Marland Kitchen omeprazole (PRILOSEC) 20 MG capsule TAKE 2 CAPSULES DAILY 180 capsule 0  . OVER THE COUNTER MEDICATION Take 2 capsules by mouth every morning. NITRIC OXIDE    . ramipril (ALTACE) 10 MG capsule TAKE 1 CAPSULE DAILY 90 capsule 1  . rosuvastatin (CRESTOR) 40 MG tablet Take 1 tablet (40 mg total) by mouth daily. 90 tablet 3   No current facility-administered medications for this visit.      Past Medical History:  Diagnosis Date  . AAA (abdominal aortic aneurysm) ([place])    ultrasound 07/2011:  2.7 x 2.8 cm  . Acute myocardial infarction, unspecified site, episode of care unspecified 1998  . Aortic stenosis    echo 4/12: normal LVF, mild LAE, no significant AS  . Arthritis    "all over" (05/21/2017)  . CAD (coronary artery disease)    s/p PCI of LAD 1998;  s/p DES to RCA 05/2009;  Myoview 4/13: low risk, no ischemia, EF 56%  . CAP (community acquired pneumonia) 05/21/2017  . COPD (chronic obstructive pulmonary disease) ([place])   .  DJD (degenerative joint disease), lumbar 07/06/2011  . Esophageal reflux   . HLD (hyperlipidemia)   . HTN (hypertension)   . PAD (peripheral artery disease) ([place])    s/p L CIA stent 09/2011 ([place])  . Pneumonia    "I've always had pneumonia; since I was 31 months old" (05/21/2017)  . Renal artery stenosis ([facility])    s/p bilat RA stents  . Urine incontinence     Past Surgical History:  Procedure Laterality Date  . CATARACT EXTRACTION W/ INTRAOCULAR LENS  IMPLANT, BILATERAL Bilateral YRS AGO  . CORONARY ANGIOPLASTY WITH STENT PLACEMENT  05/1996   PCI of LAD; "I've got a total of 4 stents" (05/21/2017)  . [place]  . LOWER EXTREMITY  ANGIOGRAM Bilateral 09/20/2011   Procedure: LOWER EXTREMITY ANGIOGRAM;  Surgeon: Wellington Hampshire, MD;  Location: [facility] CATH [facility];  Service: Cardiovascular;  Laterality: Bilateral;  . TRANSURETHRAL RESECTION OF PROSTATE  08/11/2011   Procedure: TRANSURETHRAL RESECTION OF THE PROSTATE WITH GYRUS INSTRUMENTS;  Surgeon: Fredricka Bonine, MD;  Location: [facility];  Service: Urology;  Laterality: N/A;     . VASECTOMY  1968    Social History   Socioeconomic History  . Marital status: Widowed    Spouse name: Not on file  . Number of children: Not on file  . Years of education: GED  . Highest education level: Not on file  Occupational History  . Occupation: retired  Scientific laboratory technician  . Financial resource strain: Not on file  . Food insecurity:    Worry: Not on file    Inability: Not on file  . Transportation needs:    Medical: Not on file    Non-medical: Not on file  Tobacco Use  . Smoking status: Former Smoker    Packs/day: 3.00    Years: 38.00    Pack years: 114.00    Last attempt to quit: 04/10/1988    Years since quitting: 30.2  . Smokeless tobacco: Never Used  Substance and Sexual Activity  . Alcohol use: Yes    Comment: 05/21/2017 "might drink 6 beers/year"  . Drug use: No  . Sexual activity: Not on file  Lifestyle  . Physical activity:    Days per week: Not on file    Minutes per session: Not on file  . Stress: Not on file  Relationships  . Social connections:    Talks on phone: Not on file    Gets together: Not on file    Attends religious service: Not on file    Active member of club or organization: Not on file    Attends meetings of clubs or organizations: Not on file    Relationship status: Not on file  . Intimate partner violence:    Fear of current or ex partner: Not on file    Emotionally abused: Not on file    Physically abused: Not on file    Forced sexual activity: Not on file  Other Topics Concern  . Not on file  Social History Narrative  . Not on file     Family History  Problem Relation Age of Onset  . Heart disease Mother   . Heart disease Father     ROS: Some right knee pain but no fevers or chills, productive cough, hemoptysis, dysphasia, odynophagia, melena, hematochezia, dysuria, hematuria, rash, seizure activity, orthopnea, PND, pedal edema, claudication. Remaining systems are negative.  Physical Exam: Well-developed well-nourished in no acute distress.  Skin is warm and dry.  HEENT is normal.  Neck is supple.  Chest is clear to auscultation with normal expansion.  Cardiovascular exam is regular rate and rhythm.  Abdominal exam nontender or distended. No masses palpated. Extremities show no edema. neuro grossly intact  ECG-normal sinus rhythm at a rate of 68, right bundle branch block.  Personally reviewed  A/P  1 coronary artery disease-patient doing well with no chest pain.  Plan to continue medical therapy.  Continue aspirin but decreased to 81 mg daily.  Continue statin.  Last nuclear study showed no ischemia.  2 hypertension-blood pressure is controlled.  Continue present medications and follow.  3 hyperlipidemia-continue statin.  4 history of Mobitz 1 second-degree AV block-we will continue to avoid beta-blockade.  No symptoms of syncope.  5 abdominal aortic aneurysm-we will repeat ultrasound April 2020.  6 history of abnormal chest x-ray-we will repeat study.  7 peripheral vascular disease-continue aspirin and statin.  Schedule follow-up renal Dopplers.  No significant claudication at this time.  [name], MD

## 2018-06-25 ENCOUNTER — Ambulatory Visit (HOSPITAL_COMMUNITY)
Admission: RE | Admit: 2018-06-25 | Discharge: 2018-06-25 | Disposition: A | Discharge status: Home / Self Care | Payer: Medicare Other | Source: Ambulatory Visit | Attending: Cardiology | Admitting: Cardiology

## 2018-06-25 ENCOUNTER — Other Ambulatory Visit: Payer: Self-pay

## 2018-06-25 ENCOUNTER — Ambulatory Visit (INDEPENDENT_AMBULATORY_CARE_PROVIDER_SITE_OTHER): Payer: Medicare Other | Admitting: Cardiology

## 2018-06-25 ENCOUNTER — Encounter: Payer: Self-pay | Admitting: Cardiology

## 2018-06-25 VITALS — BP 110/66 | HR 68 | Ht 67.0 in | Wt 149.0 lb

## 2018-06-25 DIAGNOSIS — I714 Abdominal aortic aneurysm, without rupture, unspecified: Secondary | ICD-10-CM

## 2018-06-25 DIAGNOSIS — R9389 Abnormal findings on diagnostic imaging of other specified body structures: Secondary | ICD-10-CM | POA: Insufficient documentation

## 2018-06-25 DIAGNOSIS — I701 Atherosclerosis of renal artery: Secondary | ICD-10-CM

## 2018-06-25 DIAGNOSIS — I2510 Atherosclerotic heart disease of native coronary artery without angina pectoris: Secondary | ICD-10-CM

## 2018-06-25 DIAGNOSIS — J439 Emphysema, unspecified: Secondary | ICD-10-CM | POA: Diagnosis not present

## 2018-06-25 MED ORDER — ASPIRIN EC 81 MG PO TBEC: 81.0000 mg | DELAYED_RELEASE_TABLET | Freq: Every day | ORAL | Status: AC

## 2018-06-25 NOTE — Patient Instructions (Signed)
Medication Instructions:  NO CHANGE If you need a refill on your cardiac medications before your next appointment, please call your pharmacy.   Lab work: If you have labs (blood work) drawn today and your tests are completely normal, you will receive your results only by: Marland Kitchen MyChart Message (if you have MyChart) OR . A paper copy in the mail If you have any lab test that is abnormal or we need to change your treatment, we will call you to review the results.  Testing/Procedures: A chest x-ray takes a picture of the organs and structures inside the chest, including the heart, lungs, and blood vessels. This test can show several things, including, whether the heart is enlarges; whether fluid is building up in the lungs; and whether pacemaker / defibrillator leads are still in place. AT [facility]  Your physician has requested that you have an abdominal aorta duplex. During this test, an ultrasound is used to evaluate the aorta. Allow 30 minutes for this exam. Do not eat after midnight the day before and avoid carbonated beverages [place] OFFICE-SCHEDULE IN APRIL  Follow-Up: At [facility], you and your health needs are our priority.  As part of our continuing mission to provide you with exceptional heart care, we have created designated Provider Care Teams.  These Care Teams include your primary Cardiologist (physician) and Advanced Practice Providers (APPs -  Physician Assistants and Nurse Practitioners) who all work together to provide you with the care you need, when you need it. You will need a follow up appointment in 12 months.  Please call our office 2 months in advance to schedule this appointment.  You may see [name] MD or one of the following Advanced Practice Providers on your designated Care Team:   Kerin Ransom, PA-C Roby Lofts, [place] . Sande Rives, PA-C

## 2018-07-12 ENCOUNTER — Other Ambulatory Visit: Payer: Self-pay

## 2018-07-12 ENCOUNTER — Encounter: Payer: Medicare Other | Attending: Registered Nurse | Admitting: Registered Nurse

## 2018-07-12 ENCOUNTER — Encounter: Payer: Self-pay | Admitting: Registered Nurse

## 2018-07-12 VITALS — Ht 67.5 in | Wt 148.0 lb

## 2018-07-12 DIAGNOSIS — G609 Hereditary and idiopathic neuropathy, unspecified: Secondary | ICD-10-CM

## 2018-07-12 DIAGNOSIS — M25561 Pain in right knee: Secondary | ICD-10-CM | POA: Diagnosis not present

## 2018-07-12 DIAGNOSIS — Z79899 Other long term (current) drug therapy: Secondary | ICD-10-CM | POA: Diagnosis not present

## 2018-07-12 DIAGNOSIS — M1711 Unilateral primary osteoarthritis, right knee: Secondary | ICD-10-CM | POA: Insufficient documentation

## 2018-07-12 DIAGNOSIS — Z5181 Encounter for therapeutic drug level monitoring: Secondary | ICD-10-CM | POA: Insufficient documentation

## 2018-07-12 DIAGNOSIS — M1611 Unilateral primary osteoarthritis, right hip: Secondary | ICD-10-CM | POA: Diagnosis not present

## 2018-07-12 DIAGNOSIS — M48062 Spinal stenosis, lumbar region with neurogenic claudication: Secondary | ICD-10-CM | POA: Diagnosis not present

## 2018-07-12 DIAGNOSIS — G894 Chronic pain syndrome: Secondary | ICD-10-CM | POA: Insufficient documentation

## 2018-07-12 DIAGNOSIS — M62838 Other muscle spasm: Secondary | ICD-10-CM | POA: Diagnosis not present

## 2018-07-12 DIAGNOSIS — I701 Atherosclerosis of renal artery: Secondary | ICD-10-CM

## 2018-07-12 MED ORDER — HYDROCODONE-ACETAMINOPHEN 5-325 MG PO TABS: 1.0000 | ORAL_TABLET | Freq: Three times a day (TID) | ORAL | 0 refills | Status: DC

## 2018-07-12 NOTE — Progress Notes (Signed)
Subjective:    Patient ID: Kristopher [name], male    DOB: 01-08-1931, 85 y.o.   MRN: [id number]  HPI: Kristopher [name] is a 85 y.o. male his appointment was changed, due to national recommendations of social distancing due to [place] 19, an audio/video telehealth visit is felt to be most appropriate for this patient at this time.  See Chart message from today for the patient's consent to telehealth from [place].   He states his pain is located in his lower back with activity and right knee pain radiating into his right hip. He rates his pain 3.His current exercise regime is walking and performing stretching exercises.  [name] Morphine equivalent is 15.00 MME. Last Oral Swab was Performed on 11/08/2017, it was consistent.   [name] CMA: Asked the Health and History Questions.   [name] and  I verified we were  speaking with the correct person using two identifiers.  Pain Inventory Average Pain 3 Pain Right Now 3 My pain is intermittent, dull, aching and throbbing  In the last 24 hours, has pain interfered with the following? General activity 0 Relation with others 0 Enjoyment of life 2 What TIME of day is your pain at its worst? all Sleep (in general) Fair  Pain is worse with: bending Pain improves with: medication Relief from Meds: 9  Mobility walk without assistance walk with assistance use a cane how many minutes can you walk? 15 ability to climb steps?  yes do you drive?  yes  Function retired  Neuro/Psych weakness trouble walking  Prior Studies Any changes since last visit?  no  Physicians involved in your care Any changes since last visit?  no   Family History  Problem Relation Age of Onset  . Heart disease Mother   . Heart disease Father    Social History   Socioeconomic History  . Marital status: Widowed    Spouse name: Not on file  . Number of children: Not on file  . Years of education: GED  .  Highest education level: Not on file  Occupational History  . Occupation: retired  Scientific laboratory technician  . Financial resource strain: Not on file  . Food insecurity:    Worry: Not on file    Inability: Not on file  . Transportation needs:    Medical: Not on file    Non-medical: Not on file  Tobacco Use  . Smoking status: Former Smoker    Packs/day: 3.00    Years: 38.00    Pack years: 114.00    Last attempt to quit: 04/10/1988    Years since quitting: 30.2  . Smokeless tobacco: Never Used  Substance and Sexual Activity  . Alcohol use: Yes    Comment: 05/21/2017 "might drink 6 beers/year"  . Drug use: No  . Sexual activity: Not on file  Lifestyle  . Physical activity:    Days per week: Not on file    Minutes per session: Not on file  . Stress: Not on file  Relationships  . Social connections:    Talks on phone: Not on file    Gets together: Not on file    Attends religious service: Not on file    Active member of club or organization: Not on file    Attends meetings of clubs or organizations: Not on file    Relationship status: Not on file  Other Topics Concern  . Not on file  Social History  Narrative  . Not on file   Past Surgical History:  Procedure Laterality Date  . CATARACT EXTRACTION W/ INTRAOCULAR LENS  IMPLANT, BILATERAL Bilateral YRS AGO  . CORONARY ANGIOPLASTY WITH STENT PLACEMENT  05/1996   PCI of LAD; "I've got a total of 4 stents" (05/21/2017)  . [place]  . LOWER EXTREMITY ANGIOGRAM Bilateral 09/20/2011   Procedure: LOWER EXTREMITY ANGIOGRAM;  Surgeon: Wellington Hampshire, MD;  Location: [facility] CATH LAB;  Service: Cardiovascular;  Laterality: Bilateral;  . TRANSURETHRAL RESECTION OF PROSTATE  08/11/2011   Procedure: TRANSURETHRAL RESECTION OF THE PROSTATE WITH GYRUS INSTRUMENTS;  Surgeon: Fredricka Bonine, MD;  [facility];  Service: Urology;  Laterality: N/A;     . VASECTOMY  1968   Past Medical History:  Diagnosis Date  . AAA (abdominal  aortic aneurysm) ([place])    ultrasound 07/2011:  2.7 x 2.8 cm  . Acute myocardial infarction, unspecified site, episode of care unspecified 1998  . Aortic stenosis    echo 4/12: normal LVF, mild LAE, no significant AS  . Arthritis    "all over" (05/21/2017)  . CAD (coronary artery disease)    s/p PCI of LAD 1998;  s/p DES to RCA 05/2009;  Myoview 4/13: low risk, no ischemia, EF 56%  . CAP (community acquired pneumonia) 05/21/2017  . COPD (chronic obstructive pulmonary disease) ([place])   . DJD (degenerative joint disease), lumbar 07/06/2011  . Esophageal reflux   . HLD (hyperlipidemia)   . HTN (hypertension)   . PAD (peripheral artery disease) ([place])    s/p L CIA stent 09/2011 ([facility])  . Pneumonia    "I've always had pneumonia; since I was 31 months old" (05/21/2017)  . Renal artery stenosis ([facility])    s/p bilat RA stents  . Urine incontinence    There were no vitals taken for this visit.  Opioid Risk Score:   Fall Risk Score:  `1  Depression screen PHQ 2/9  Depression screen [facility] 2/9 04/11/2018 02/12/2018 01/09/2018 11/30/2017 08/06/2017 07/05/2017 05/01/2017  Decreased Interest 0 0 0 0 0 0 0  Down, Depressed, Hopeless 0 0 0 0 0 0 0  PHQ - 2 Score 0 0 0 0 0 0 0  Altered sleeping - - - - - - -  Tired, decreased energy - - - - - - -  Change in appetite - - - - - - -  Feeling bad or failure about yourself  - - - - - - -  Trouble concentrating - - - - - - -  Moving slowly or fidgety/restless - - - - - - -  Suicidal thoughts - - - - - - -  PHQ-9 Score - - - - - - -    Review of Systems  Constitutional: Negative.   HENT: Negative.   Eyes: Negative.   Respiratory: Negative.   Cardiovascular: Negative.   Gastrointestinal: Negative.   Endocrine: Negative.   Genitourinary: Negative.   Musculoskeletal: Positive for arthralgias and gait problem.  Skin: Negative.   Allergic/Immunologic: Negative.   Neurological: Positive for weakness.  Hematological: Negative.   Psychiatric/Behavioral:  Negative.        Objective:   Physical Exam Vitals signs and nursing note reviewed.  Neurological:     Mental Status: He is oriented to person, place, and time.           Assessment & Plan:  1. Patellofemoral arthritis. Continue Voltaren gel, and continue with exercise regimen.07/12/2018 2 Chronic Low  Back Pain/ Lumbar Stenosis:Continue HEP as Tolerated and Continue to Monitor.Continue current medication regimen.07/12/2018 3. Chronic Pain: Refilled: Hydrocodone 5/325 mg one tablet three times a day #90.07/12/2018. We will continue the opioid monitoring program, this consists of regular clinic visits, examinations, urine drug screen, pill counts as well as use of [place] Controlled Substance reporting System. 4. Muscle Spasm: Continuecurrent medication regimen withFlexeril. 07/12/2018 5. Primary Osteoarthritis of Right Hip: Continue to Alternate with Ice and Heat Therapy. Continue to Monitor. 07/12/2018 6. Idiopathic Peripheral Neuropathy: Refuses Gabapentin or Amitriptylline at this time. Continue to Monitor.07/12/2018  F/U in 1 month Location of Patient: Home Location of Provider: Office Total Time Spent: 10 minutes

## 2018-07-16 ENCOUNTER — Encounter: Payer: Medicare Other | Admitting: Registered Nurse

## 2018-07-24 ENCOUNTER — Other Ambulatory Visit (HOSPITAL_COMMUNITY): Payer: Medicare Other

## 2018-07-27 ENCOUNTER — Other Ambulatory Visit: Payer: Self-pay | Admitting: Registered Nurse

## 2018-08-12 ENCOUNTER — Encounter: Payer: Self-pay | Admitting: Registered Nurse

## 2018-08-12 ENCOUNTER — Encounter: Payer: Medicare Other | Attending: Registered Nurse | Admitting: Registered Nurse

## 2018-08-12 ENCOUNTER — Other Ambulatory Visit: Payer: Self-pay

## 2018-08-12 VITALS — Ht 67.5 in | Wt 146.0 lb

## 2018-08-12 DIAGNOSIS — Z79899 Other long term (current) drug therapy: Secondary | ICD-10-CM | POA: Diagnosis not present

## 2018-08-12 DIAGNOSIS — M25561 Pain in right knee: Secondary | ICD-10-CM | POA: Diagnosis not present

## 2018-08-12 DIAGNOSIS — G894 Chronic pain syndrome: Secondary | ICD-10-CM

## 2018-08-12 DIAGNOSIS — M62838 Other muscle spasm: Secondary | ICD-10-CM

## 2018-08-12 DIAGNOSIS — M1711 Unilateral primary osteoarthritis, right knee: Secondary | ICD-10-CM | POA: Insufficient documentation

## 2018-08-12 DIAGNOSIS — Z5181 Encounter for therapeutic drug level monitoring: Secondary | ICD-10-CM

## 2018-08-12 DIAGNOSIS — I701 Atherosclerosis of renal artery: Secondary | ICD-10-CM

## 2018-08-12 DIAGNOSIS — M1611 Unilateral primary osteoarthritis, right hip: Secondary | ICD-10-CM | POA: Diagnosis not present

## 2018-08-12 DIAGNOSIS — M48062 Spinal stenosis, lumbar region with neurogenic claudication: Secondary | ICD-10-CM

## 2018-08-12 MED ORDER — HYDROCODONE-ACETAMINOPHEN 5-325 MG PO TABS: 1.0000 | ORAL_TABLET | Freq: Three times a day (TID) | ORAL | 0 refills | Status: DC

## 2018-08-12 NOTE — Progress Notes (Signed)
Subjective:    Patient ID: Kristopher [name], male    DOB: Feb 02, 1931, 85 y.o.   MRN: [id number]  HPI: Kristopher [name] is a 85 y.o. male his appointment was changed, due to national recommendations of social distancing due to Kristopher [name] 21, an audio/video telehealth visit is felt to be most appropriate for this patient at this time.  See Chart message from today for the patient's consent to telehealth from [place].     He states his pain is located in his right hip and right knee. He  rates his  pain 2. His  current exercise regime is walking and performing stretching exercises.  Kristopher [name] states his girlfriend passed away, emotional support given.   Kristopher [name] Morphine equivalent is 15.00 MME.  Last Oral Swab was Perform on 11/08/2017, it was consistent.  Kristopher [name] asked the Health and History Questions. This provider and Kristopher [name]  verified we were speaking with the correct person using two identifiers.   Pain Inventory Average Pain 3 Pain Right Now 2 My pain is constant, sharp and dull  In the last 24 hours, has pain interfered with the following? General activity 2 Relation with others 2 Enjoyment of life 2 What TIME of day is your pain at its worst? morning Sleep (in general) Fair  Pain is worse with: bending and some activites Pain improves with: medication Relief from Meds: 8  Mobility walk with assistance use a cane ability to climb steps?  yes do you drive?  yes  Function retired  Neuro/Psych No problems in this area  Prior Studies Any changes since last visit?  no  Physicians involved in your care Any changes since last visit?  no   Family History  Problem Relation Age of Onset  . Heart disease Mother   . Heart disease Father    Social History   Socioeconomic History  . Marital status: Widowed    Spouse name: Not on file  . Number of children: Not on file  . Years of education: GED  . Highest education  level: Not on file  Occupational History  . Occupation: retired  Scientific laboratory technician  . Financial resource strain: Not on file  . Food insecurity:    Worry: Not on file    Inability: Not on file  . Transportation needs:    Medical: Not on file    Non-medical: Not on file  Tobacco Use  . Smoking status: Former Smoker    Packs/day: 3.00    Years: 38.00    Pack years: 114.00    Last attempt to quit: 04/10/1988    Years since quitting: 30.3  . Smokeless tobacco: Never Used  Substance and Sexual Activity  . Alcohol use: Yes    Comment: 05/21/2017 "might drink 6 beers/year"  . Drug use: No  . Sexual activity: Not on file  Lifestyle  . Physical activity:    Days per week: Not on file    Minutes per session: Not on file  . Stress: Not on file  Relationships  . Social connections:    Talks on phone: Not on file    Gets together: Not on file    Attends religious service: Not on file    Active member of club or organization: Not on file    Attends meetings of clubs or organizations: Not on file    Relationship status: Not on file  Other Topics Concern  . Not on file  Social History Narrative  . Not on file   Past Surgical History:  Procedure Laterality Date  . CATARACT EXTRACTION W/ INTRAOCULAR LENS  IMPLANT, BILATERAL Bilateral YRS AGO  . CORONARY ANGIOPLASTY WITH STENT PLACEMENT  05/1996   PCI of LAD; "I've got a total of 4 stents" (05/21/2017)  . [place]  . LOWER EXTREMITY ANGIOGRAM Bilateral 09/20/2011   Procedure: LOWER EXTREMITY ANGIOGRAM;  Surgeon: Wellington Hampshire, MD;  Location: [facility] CATH LAB;  Service: Cardiovascular;  Laterality: Bilateral;  . TRANSURETHRAL RESECTION OF PROSTATE  08/11/2011   Procedure: TRANSURETHRAL RESECTION OF THE PROSTATE WITH GYRUS INSTRUMENTS;  Surgeon: Fredricka Bonine, MD;  [facility];  Service: Urology;  Laterality: N/A;     . VASECTOMY  1968   Past Medical History:  Diagnosis Date  . AAA (abdominal aortic aneurysm)  ([place])    ultrasound 07/2011:  2.7 x 2.8 cm  . Acute myocardial infarction, unspecified site, episode of care unspecified 1998  . Aortic stenosis    echo 4/12: normal LVF, mild LAE, no significant AS  . Arthritis    "all over" (05/21/2017)  . CAD (coronary artery disease)    s/p PCI of LAD 1998;  s/p DES to RCA 05/2009;  Myoview 4/13: low risk, no ischemia, EF 56%  . CAP (community acquired pneumonia) 05/21/2017  . COPD (chronic obstructive pulmonary disease) ([place])   . DJD (degenerative joint disease), lumbar 07/06/2011  . Esophageal reflux   . HLD (hyperlipidemia)   . HTN (hypertension)   . PAD (peripheral artery disease) ([place])    s/p L CIA stent 09/2011 ([place])  . Pneumonia    "I've always had pneumonia; since I was 81 months old" (05/21/2017)  . Renal artery stenosis (HCC)    s/p bilat RA stents  . Urine incontinence    Ht 5' 7.5" (1.715 m)   Wt 146 lb (66.2 kg)   BMI 22.53 kg/m   Opioid Risk Score:   Fall Risk Score:  `1  Depression screen PHQ 2/9  Depression screen [facility] 2/9 04/11/2018 02/12/2018 01/09/2018 11/30/2017 08/06/2017 07/05/2017 05/01/2017  Decreased Interest 0 0 0 0 0 0 0  Down, Depressed, Hopeless 0 0 0 0 0 0 0  PHQ - 2 Score 0 0 0 0 0 0 0  Altered sleeping - - - - - - -  Tired, decreased energy - - - - - - -  Change in appetite - - - - - - -  Feeling bad or failure about yourself  - - - - - - -  Trouble concentrating - - - - - - -  Moving slowly or fidgety/restless - - - - - - -  Suicidal thoughts - - - - - - -  PHQ-9 Score - - - - - - -    Review of Systems  Constitutional: Negative.   HENT: Negative.   Eyes: Negative.   Respiratory: Negative.   Cardiovascular: Negative.   Gastrointestinal: Negative.   Endocrine: Negative.   Genitourinary: Negative.   Musculoskeletal: Positive for arthralgias and gait problem.  Skin: Negative.   Allergic/Immunologic: Negative.   Hematological: Negative.   Psychiatric/Behavioral: Negative.   All other systems reviewed and  are negative.      Objective:   Physical Exam Vitals signs and nursing note reviewed.  Musculoskeletal:     Comments: No Physical Exam: Virtual Visit  Neurological:     Mental Status: He is oriented to person, place, and time.  Assessment & Plan:  1. Patellofemoral arthritis. Continue Voltaren gel, and continue with exercise regimen.08/12/2018 2 Chronic Low Back Pain/ Lumbar Stenosis:Continue HEP as Tolerated and Continue to Monitor.Continue current medication regimen.08/12/2018 3. Chronic Pain: Refilled: Hydrocodone 5/325 mg one tablet three times a day #90.08/12/2018. We will continue the opioid monitoring program, this consists of regular clinic visits, examinations, urine drug screen, pill counts as well as use of [place] Controlled Substance reporting System. 4. Muscle Spasm: Continuecurrent medication regimen withFlexeril. 08/12/2018 5. Primary Osteoarthritis of Right Hip: Continue to Alternate with Ice and Heat Therapy. Continue to Monitor. 08/12/2018 6. Idiopathic Peripheral Neuropathy: No Complaints today. Refuses Gabapentin or Amitriptylline at this time. Continue to Monitor.08/12/2018  F/U in 1 month Location of Patient: Home Location of Provider: Office Total Time Spent: 10 minutes

## 2018-08-13 ENCOUNTER — Telehealth: Payer: Self-pay | Admitting: Internal Medicine

## 2018-08-13 MED ORDER — OMEPRAZOLE 20 MG PO CPDR: 40.0000 mg | DELAYED_RELEASE_CAPSULE | Freq: Every day | ORAL | 0 refills | Status: DC

## 2018-08-13 NOTE — Telephone Encounter (Signed)
Copied from [place] [id number]. Topic: Quick Communication - Rx Refill/Question >> Aug 13, 2018  9:00 AM Richardo Priest, NT wrote: Medication:  omeprazole (PRILOSEC) 20 MG capsule   Has the patient contacted their pharmacy? Yes patient states he has no more refills and is down to 4 pills.  Preferred Pharmacy (with phone number or street name):  [place], [place] [phone] (Phone) [phone] (Fax)  Agent: Please be advised that RX refills may take up to 3 business days. We ask that you follow-up with your pharmacy.

## 2018-08-15 ENCOUNTER — Other Ambulatory Visit (HOSPITAL_COMMUNITY): Payer: Medicare Other

## 2018-09-12 ENCOUNTER — Encounter: Payer: Medicare Other | Attending: Registered Nurse | Admitting: Registered Nurse

## 2018-09-12 ENCOUNTER — Other Ambulatory Visit: Payer: Self-pay

## 2018-09-12 ENCOUNTER — Encounter: Payer: Self-pay | Admitting: Registered Nurse

## 2018-09-12 VITALS — BP 107/78 | HR 68 | Temp 96.4°F | Ht 69.0 in | Wt 145.0 lb

## 2018-09-12 DIAGNOSIS — M25561 Pain in right knee: Secondary | ICD-10-CM | POA: Diagnosis not present

## 2018-09-12 DIAGNOSIS — M62838 Other muscle spasm: Secondary | ICD-10-CM | POA: Diagnosis not present

## 2018-09-12 DIAGNOSIS — I701 Atherosclerosis of renal artery: Secondary | ICD-10-CM | POA: Diagnosis not present

## 2018-09-12 DIAGNOSIS — G894 Chronic pain syndrome: Secondary | ICD-10-CM

## 2018-09-12 DIAGNOSIS — M1611 Unilateral primary osteoarthritis, right hip: Secondary | ICD-10-CM | POA: Diagnosis not present

## 2018-09-12 DIAGNOSIS — Z5181 Encounter for therapeutic drug level monitoring: Secondary | ICD-10-CM | POA: Diagnosis not present

## 2018-09-12 DIAGNOSIS — M48062 Spinal stenosis, lumbar region with neurogenic claudication: Secondary | ICD-10-CM | POA: Diagnosis not present

## 2018-09-12 DIAGNOSIS — Z79899 Other long term (current) drug therapy: Secondary | ICD-10-CM

## 2018-09-12 DIAGNOSIS — M1711 Unilateral primary osteoarthritis, right knee: Secondary | ICD-10-CM | POA: Insufficient documentation

## 2018-09-12 MED ORDER — HYDROCODONE-ACETAMINOPHEN 5-325 MG PO TABS: 1.0000 | ORAL_TABLET | Freq: Three times a day (TID) | ORAL | 0 refills | Status: DC

## 2018-09-12 NOTE — Progress Notes (Signed)
Subjective:    Patient ID: Kristopher [name], male    DOB: [birth year], 85 y.o.   MRN: [id number]  HPI: Kristopher [name] is a 85 y.o. male his appointment was changed, due to national recommendations of social distancing due to Miranda 19, an audio/video telehealth visit is felt to be most appropriate for this patient at this time.  See Chart message from today for the patient's consent to telehealth from [place].    He states his pain is located in his right knee radiating into his right hip. He rates his pain 3. His current exercise regime is walking and mowing his lawn with a self propel mower.  Kristopher [name] Morphine equivalent is 15.00MME. Oral Swab was Performed on 08/0/ 2019, it was consistent.     Jasmine December CMA asked the Health and History Questions. This provider and Kennon Rounds  verified we were speaking with the correct person using two identifiers.    Pain Inventory Average Pain 9 Pain Right Now 3 My pain is constant, aching and tight  In the last 24 hours, has pain interfered with the following? General activity 3 Relation with others 3 Enjoyment of life 3 What TIME of day is your pain at its worst? evening Sleep (in general) Fair  Pain is worse with: some activites Pain improves with: medication Relief from Meds: 8  Mobility walk with assistance use a cane how many minutes can you walk? 10 ability to climb steps?  yes do you drive?  yes  Function retired  Neuro/Psych trouble walking  Prior Studies Any changes since last visit?  no  Physicians involved in your care Any changes since last visit?  no   Family History  Problem Relation Age of Onset  . Heart disease Mother   . Heart disease Father    Social History   Socioeconomic History  . Marital status: Widowed    Spouse name: Not on file  . Number of children: Not on file  . Years of education: GED  . Highest education level: Not on file  Occupational History   . Occupation: retired  Scientific laboratory technician  . Financial resource strain: Not on file  . Food insecurity:    Worry: Not on file    Inability: Not on file  . Transportation needs:    Medical: Not on file    Non-medical: Not on file  Tobacco Use  . Smoking status: Former Smoker    Packs/day: 3.00    Years: 38.00    Pack years: 114.00    Last attempt to quit: 04/10/1988    Years since quitting: 30.4  . Smokeless tobacco: Never Used  Substance and Sexual Activity  . Alcohol use: Yes    Comment: 05/21/2017 "might drink 6 beers/year"  . Drug use: No  . Sexual activity: Not on file  Lifestyle  . Physical activity:    Days per week: Not on file    Minutes per session: Not on file  . Stress: Not on file  Relationships  . Social connections:    Talks on phone: Not on file    Gets together: Not on file    Attends religious service: Not on file    Active member of club or organization: Not on file    Attends meetings of clubs or organizations: Not on file    Relationship status: Not on file  Other Topics Concern  . Not on file  Social History Narrative  .  Not on file   Past Surgical History:  Procedure Laterality Date  . CATARACT EXTRACTION W/ INTRAOCULAR LENS  IMPLANT, BILATERAL Bilateral YRS AGO  . CORONARY ANGIOPLASTY WITH STENT PLACEMENT  05/1996   PCI of LAD; "I've got a total of 4 stents" (05/21/2017)  . [place]  . LOWER EXTREMITY ANGIOGRAM Bilateral 09/20/2011   Procedure: LOWER EXTREMITY ANGIOGRAM;  Surgeon: Wellington Hampshire, MD;  Location: [facility] CATH [facility];  Service: Cardiovascular;  Laterality: Bilateral;  . TRANSURETHRAL RESECTION OF PROSTATE  08/11/2011   Procedure: TRANSURETHRAL RESECTION OF THE PROSTATE WITH GYRUS INSTRUMENTS;  Surgeon: Fredricka Bonine, MD;  Location: [facility];  Service: Urology;  Laterality: N/A;     . VASECTOMY  1968   Past Medical History:  Diagnosis Date  . AAA (abdominal aortic aneurysm) ([place])    ultrasound 07/2011:  2.7 x 2.8 cm  .  Acute myocardial infarction, unspecified site, episode of care unspecified 1998  . Aortic stenosis    echo 4/12: normal LVF, mild LAE, no significant AS  . Arthritis    "all over" (05/21/2017)  . CAD (coronary artery disease)    s/p PCI of LAD 1998;  s/p DES to RCA 05/2009;  Myoview 4/13: low risk, no ischemia, EF 56%  . CAP (community acquired pneumonia) 05/21/2017  . COPD (chronic obstructive pulmonary disease) ([place])   . DJD (degenerative joint disease), lumbar 07/06/2011  . Esophageal reflux   . HLD (hyperlipidemia)   . HTN (hypertension)   . PAD (peripheral artery disease) ([place])    s/p L CIA stent 09/2011 ([facility])  . Pneumonia    "I've always had pneumonia; since I was 53 months old" (05/21/2017)  . Renal artery stenosis ([facility])    s/p bilat RA stents  . Urine incontinence    BP 107/78   Pulse 68   Temp (!) 96.4 F (35.8 C)   Ht 5\' 9"  (1.753 m)   Wt 145 lb (65.8 kg)   BMI 21.41 kg/m   Opioid Risk Score:   Fall Risk Score:  `1  Depression screen PHQ 2/9  Depression screen [facility] 2/9 09/12/2018 04/11/2018 02/12/2018 01/09/2018 11/30/2017 08/06/2017 07/05/2017  Decreased Interest 0 0 0 0 0 0 0  Down, Depressed, Hopeless 0 0 0 0 0 0 0  PHQ - 2 Score 0 0 0 0 0 0 0  Altered sleeping - - - - - - -  Tired, decreased energy - - - - - - -  Change in appetite - - - - - - -  Feeling bad or failure about yourself  - - - - - - -  Trouble concentrating - - - - - - -  Moving slowly or fidgety/restless - - - - - - -  Suicidal thoughts - - - - - - -  PHQ-9 Score - - - - - - -    Review of Systems  Constitutional: Negative.   HENT: Negative.   Eyes: Negative.   Respiratory: Negative.   Cardiovascular: Negative.   Gastrointestinal: Negative.   Endocrine: Negative.   Genitourinary: Negative.   Musculoskeletal: Positive for joint swelling.       Hip and knee pain  Skin: Negative.   Allergic/Immunologic: Negative.   Neurological: Negative.   Hematological: Negative.   Psychiatric/Behavioral:  Negative.   All other systems reviewed and are negative.      Objective:   Physical Exam Vitals signs and nursing note reviewed.  Musculoskeletal:     Comments:  No Physical Exam Performed: Virtual Visit  Neurological:     Mental Status: He is oriented to person, place, and time.           Assessment & Plan:  1. Patellofemoral arthritis. Continue Voltaren gel, and continue with exercise regimen.09/12/2018 2 Chronic Low Back Pain/ Lumbar Stenosis:Continue HEP as Tolerated and Continue to Monitor.Continue current medication regimen.09/12/2018 3. Chronic Pain: Refilled: Hydrocodone 5/325 mg one tablet three times a day #90.09/12/2018. We will continue the opioid monitoring program, this consists of regular clinic visits, examinations, urine drug screen, pill counts as well as use of [place] Controlled Substance reporting System. 4. Muscle Spasm: Continuecurrent medication regimen withFlexeril. 09/12/2018 5. Primary Osteoarthritis of Right Hip: Continue to Alternate with Ice and Heat Therapy. Continue to Monitor. 09/12/2018 6. Idiopathic Peripheral Neuropathy: No Complaints today. Refuses Gabapentin or Amitriptylline at this time. Continue to Monitor.09/12/2018  Telephone Call  Location of patient: In his Home Location of provider: Office Established patient Time spent on call: 10 Minutes

## 2018-09-20 ENCOUNTER — Ambulatory Visit: Payer: Self-pay

## 2018-09-20 ENCOUNTER — Other Ambulatory Visit: Payer: Self-pay

## 2018-09-20 ENCOUNTER — Encounter: Payer: Self-pay | Admitting: Internal Medicine

## 2018-09-20 ENCOUNTER — Other Ambulatory Visit (INDEPENDENT_AMBULATORY_CARE_PROVIDER_SITE_OTHER): Payer: Medicare Other

## 2018-09-20 ENCOUNTER — Ambulatory Visit (INDEPENDENT_AMBULATORY_CARE_PROVIDER_SITE_OTHER): Payer: Medicare Other | Admitting: Internal Medicine

## 2018-09-20 VITALS — BP 121/82 | HR 82 | Temp 97.9°F | Ht 69.0 in | Wt 141.0 lb

## 2018-09-20 DIAGNOSIS — W57XXXA Bitten or stung by nonvenomous insect and other nonvenomous arthropods, initial encounter: Secondary | ICD-10-CM

## 2018-09-20 DIAGNOSIS — R739 Hyperglycemia, unspecified: Secondary | ICD-10-CM

## 2018-09-20 DIAGNOSIS — I10 Essential (primary) hypertension: Secondary | ICD-10-CM | POA: Diagnosis not present

## 2018-09-20 DIAGNOSIS — S40861A Insect bite (nonvenomous) of right upper arm, initial encounter: Secondary | ICD-10-CM | POA: Diagnosis not present

## 2018-09-20 DIAGNOSIS — E785 Hyperlipidemia, unspecified: Secondary | ICD-10-CM

## 2018-09-20 DIAGNOSIS — E559 Vitamin D deficiency, unspecified: Secondary | ICD-10-CM | POA: Diagnosis not present

## 2018-09-20 DIAGNOSIS — E538 Deficiency of other specified B group vitamins: Secondary | ICD-10-CM | POA: Diagnosis not present

## 2018-09-20 DIAGNOSIS — I701 Atherosclerosis of renal artery: Secondary | ICD-10-CM | POA: Diagnosis not present

## 2018-09-20 DIAGNOSIS — E611 Iron deficiency: Secondary | ICD-10-CM

## 2018-09-20 LAB — BASIC METABOLIC PANEL
BUN: 25 mg/dL — ABNORMAL HIGH (ref 6–23)
CO2: 25 mEq/L (ref 19–32)
Calcium: 9.3 mg/dL (ref 8.4–10.5)
Chloride: 109 mEq/L (ref 96–112)
Creatinine, Ser: 1.14 mg/dL (ref 0.40–1.50)
GFR: 60.91 mL/min (ref >60.00)
Glucose, Bld: 108 mg/dL — ABNORMAL HIGH (ref 70–99)
Potassium: 4.3 mEq/L (ref 3.5–5.1)
Sodium: 142 mEq/L (ref 135–145)

## 2018-09-20 LAB — LIPID PANEL
Cholesterol: 143 mg/dL (ref 0–200)
HDL: 50.4 mg/dL (ref >39.00)
LDL Cholesterol: 74 mg/dL (ref 0–99)
NonHDL: 92.73
Total CHOL/HDL Ratio: 3
Triglycerides: 94 mg/dL (ref 0.0–149.0)
VLDL: 18.8 mg/dL (ref 0.0–40.0)

## 2018-09-20 LAB — CBC WITH DIFFERENTIAL/PLATELET
Basophils Absolute: 0.1 10*3/uL (ref 0.0–0.1)
Basophils Relative: 0.8 % (ref 0.0–3.0)
Eosinophils Absolute: 0.4 10*3/uL (ref 0.0–0.7)
Eosinophils Relative: 4.4 % (ref 0.0–5.0)
HCT: 40.1 % (ref 39.0–52.0)
Hemoglobin: 13.1 g/dL (ref 13.0–17.0)
Lymphocytes Relative: 26 % (ref 12.0–46.0)
Lymphs Abs: 2.4 10*3/uL (ref 0.7–4.0)
MCHC: 32.6 g/dL (ref 30.0–36.0)
MCV: 92.1 fl (ref 78.0–100.0)
Monocytes Absolute: 0.8 10*3/uL (ref 0.1–1.0)
Monocytes Relative: 8.3 % (ref 3.0–12.0)
Neutro Abs: 5.7 10*3/uL (ref 1.4–7.7)
Neutrophils Relative %: 60.5 % (ref 43.0–77.0)
Platelets: 243 10*3/uL (ref 150.0–400.0)
RBC: 4.36 Mil/uL (ref 4.22–5.81)
RDW: 15.4 % (ref 11.5–15.5)
WBC: 9.4 10*3/uL (ref 4.0–10.5)

## 2018-09-20 LAB — HEMOGLOBIN A1C: Hgb A1c MFr Bld: 6.6 % — ABNORMAL HIGH (ref 4.6–6.5)

## 2018-09-20 LAB — HEPATIC FUNCTION PANEL
ALT: 21 U/L (ref 0–53)
AST: 28 U/L (ref 0–37)
Albumin: 4.2 g/dL (ref 3.5–5.2)
Alkaline Phosphatase: 97 U/L (ref 39–117)
Bilirubin, Direct: 0.1 mg/dL (ref 0.0–0.3)
Total Bilirubin: 0.4 mg/dL (ref 0.2–1.2)
Total Protein: 6.8 g/dL (ref 6.0–8.3)

## 2018-09-20 LAB — IBC PANEL
Iron: 59 ug/dL (ref 42–165)
Saturation Ratios: 20 % (ref 20.0–50.0)
Transferrin: 211 mg/dL — ABNORMAL LOW (ref 212.0–360.0)

## 2018-09-20 LAB — TSH: TSH: 4.83 u[IU]/mL — ABNORMAL HIGH (ref 0.35–4.50)

## 2018-09-20 LAB — VITAMIN D 25 HYDROXY (VIT D DEFICIENCY, FRACTURES): VITD: 32.84 ng/mL (ref 30.00–100.00)

## 2018-09-20 LAB — VITAMIN B12: Vitamin B-12: >1500 pg/mL — ABNORMAL HIGH (ref 211–911)

## 2018-09-20 MED ORDER — TRIAMCINOLONE ACETONIDE 0.1 % EX CREA: 1.0000 "application " | TOPICAL_CREAM | Freq: Two times a day (BID) | CUTANEOUS | 0 refills | Status: AC

## 2018-09-20 NOTE — Assessment & Plan Note (Signed)
stable overall by history and exam, recent data reviewed with pt, and pt to continue medical treatment as before,  to f/u any worsening symptoms or concerns  

## 2018-09-20 NOTE — Assessment & Plan Note (Signed)
stable overall by history and exam, recent data reviewed with pt, and pt to continue medical treatment as before,  to f/u any worsening symptoms or concerns, for lipids with labs 

## 2018-09-20 NOTE — Progress Notes (Signed)
Subjective:    Patient ID: Kristopher [name], male    DOB: 01-12-1931, 85 y.o.   MRN: [id number]  HPI  Here for yearly f/u;  Overall doing ok;  Pt denies Chest pain, worsening SOB, DOE, wheezing, orthopnea, PND, worsening LE edema, palpitations, dizziness or syncope.  Pt denies neurological change such as new headache, facial or extremity weakness.  Pt denies polydipsia, polyuria, or low sugar symptoms. Pt states overall good compliance with treatment and medications, good tolerability, and has been trying to follow appropriate diet.  Pt denies worsening depressive symptoms, suicidal ideation or panic. No fever, night sweats, wt loss, loss of appetite, or other constitutional symptoms.  Pt states good ability with ADL's, has low fall risk, home safety reviewed and adequate, no other significant changes in hearing or vision, and not active with exercise, walks with cane. Long time girlfriend recently passed, and has daughter lives very near.  . Wt Readings from Last 3 Encounters:  09/20/18 141 lb (64 kg)  09/12/18 145 lb (65.8 kg)  08/12/18 146 lb (66.2 kg)  On hydrocodone chronic for about 5 yrs for right knee DJD.  Also with right bicep rash started 2 days ago when had some kind of insect he didn't see bite him while sitting outside at [employer].  No fever and erythem somewhat improved.   Past Medical History:  Diagnosis Date  . AAA (abdominal aortic aneurysm) ([place])    ultrasound 07/2011:  2.7 x 2.8 cm  . Acute myocardial infarction, unspecified site, episode of care unspecified 1998  . Aortic stenosis    echo 4/12: normal LVF, mild LAE, no significant AS  . Arthritis    "all over" (05/21/2017)  . CAD (coronary artery disease)    s/p PCI of LAD 1998;  s/p DES to RCA 05/2009;  Myoview 4/13: low risk, no ischemia, EF 56%  . CAP (community acquired pneumonia) 05/21/2017  . COPD (chronic obstructive pulmonary disease) ([place])   . DJD (degenerative joint disease), lumbar 07/06/2011  . Esophageal reflux   .  HLD (hyperlipidemia)   . HTN (hypertension)   . PAD (peripheral artery disease) ([place])    s/p L CIA stent 09/2011 ([place])  . Pneumonia    "I've always had pneumonia; since I was 87 months old" (05/21/2017)  . Renal artery stenosis ([facility])    s/p bilat RA stents  . Urine incontinence    Past Surgical History:  Procedure Laterality Date  . CATARACT EXTRACTION W/ INTRAOCULAR LENS  IMPLANT, BILATERAL Bilateral YRS AGO  . CORONARY ANGIOPLASTY WITH STENT PLACEMENT  05/1996   PCI of LAD; "I've got a total of 4 stents" (05/21/2017)  . [place]  . LOWER EXTREMITY ANGIOGRAM Bilateral 09/20/2011   Procedure: LOWER EXTREMITY ANGIOGRAM;  Surgeon: Wellington Hampshire, MD;  Location: [facility] CATH LAB;  Service: Cardiovascular;  Laterality: Bilateral;  . TRANSURETHRAL RESECTION OF PROSTATE  08/11/2011   Procedure: TRANSURETHRAL RESECTION OF THE PROSTATE WITH GYRUS INSTRUMENTS;  Surgeon: Fredricka Bonine, MD;  Location[facility];  Service: Urology;  Laterality: N/A;     . VASECTOMY  1968    reports that he quit smoking about 30 years ago. He has a 114.00 pack-year smoking history. He has never used smokeless tobacco. He reports current alcohol use. He reports that he does not use drugs. family history includes Heart disease in his father and mother. Allergies  Allergen Reactions  . Flu Virus Vaccine Other (See Comments)    Several reaction.  Took it once woke up two days later in the hospital.   . Influenza Vaccines Other (See Comments)    unknown  . Sulfa Antibiotics Rash   Current Outpatient Medications on File Prior to Visit  Medication Sig Dispense Refill  . Ascorbic Acid (VITAMIN C) 500 MG tablet Take 500 mg by mouth 2 (two) times daily.     Marland Kitchen aspirin 81 MG tablet Take 1 tablet (81 mg total) by mouth daily.    Marland Kitchen BEE POLLEN PO Take by mouth.    Marland Kitchen Co-Enzyme Q-10 100 MG CAPS Take 1 capsule by mouth every evening.     . cyclobenzaprine (FLEXERIL) 5 MG tablet TAKE 1 TABLET(5 MG) BY MOUTH AT  BEDTIME AS NEEDED FOR MUSCLE SPASMS 30 tablet 2  . diclofenac sodium (VOLTAREN) 1 % GEL APPLY 2 GRAMS TOPICALLY FOUR TIMES A DAY AS NEEDED. 700 g 4  . Docusate Calcium (STOOL SOFTENER PO) Take 2 capsules by mouth daily.    Marland Kitchen HYDROcodone-acetaminophen (NORCO/VICODIN) 5-325 MG tablet Take 1 tablet by mouth 3 (three) times daily before meals. 90 tablet 0  . Multiple Vitamin (MULTIVITAMIN) capsule Take 1 capsule by mouth daily.     . Multiple Vitamins-Minerals (PRESERVISION AREDS PO) Take 2 tablets by mouth daily.    . nitroGLYCERIN (NITROSTAT) 0.4 MG SL tablet Place 1 tablet (0.4 mg total) under the tongue every 5 (five) minutes as needed for chest pain. 90 tablet 3  . Omega-3 Fatty Acids (OMEGA-3 PLUS PO) Take by mouth.    Marland Kitchen omeprazole (PRILOSEC) 20 MG capsule Take 2 capsules (40 mg total) by mouth daily. 180 capsule 0  . OVER THE COUNTER MEDICATION Take 2 capsules by mouth every morning. NITRIC OXIDE    . ramipril (ALTACE) 10 MG capsule TAKE 1 CAPSULE DAILY 90 capsule 1  . rosuvastatin (CRESTOR) 40 MG tablet Take 1 tablet (40 mg total) by mouth daily. 90 tablet 3   No current facility-administered medications on file prior to visit.    Review of Systems  Constitutional: Negative for other unusual diaphoresis or sweats HENT: Negative for ear discharge or swelling Eyes: Negative for other worsening visual disturbances Respiratory: Negative for stridor or other swelling  Gastrointestinal: Negative for worsening distension or other blood Genitourinary: Negative for retention or other urinary change Musculoskeletal: Negative for other MSK pain or swelling Skin: Negative for color change or other new lesions Neurological: Negative for worsening tremors and other numbness  Psychiatric/Behavioral: Negative for worsening agitation or other fatigue All other system neg per pt    Objective:   Physical Exam BP 121/82   Pulse 82   Temp 97.9 F (36.6 C) (Oral)   Ht 5\' 9"  (1.753 m)   Wt 141 lb (64  kg)   SpO2 97%   BMI 20.82 kg/m  VS noted,  Constitutional: Pt appears in NAD HENT: Head: NCAT.  Right Ear: External ear normal.  Left Ear: External ear normal.  Eyes: . Pupils are equal, round, and reactive to light. Conjunctivae and EOM are normal Nose: without d/c or deformity Neck: Neck supple. Gross normal ROM Cardiovascular: Normal rate and regular rhythm.   Pulmonary/Chest: Effort normal and breath sounds without rales or wheezing.  Abd:  Soft, NT, ND, + BS, no organomegaly Neurological: Pt is alert. At baseline orientation, motor grossly intact Skin: Skin is warm. + 2 cm area erythem rashes to distal bicep area with central bite nontender no drainage, no other new lesions, no LE edema Psychiatric: Pt behavior is  normal without agitation  No other exam findings Lab Results  Component Value Date   WBC 9.4 09/20/2018   HGB 13.1 09/20/2018   HCT 40.1 09/20/2018   PLT 243.0 09/20/2018   GLUCOSE 108 (H) 09/20/2018   CHOL 143 09/20/2018   TRIG 94.0 09/20/2018   HDL 50.40 09/20/2018   LDLCALC 74 09/20/2018   ALT 21 09/20/2018   AST 28 09/20/2018   NA 142 09/20/2018   K 4.3 09/20/2018   CL 109 09/20/2018   CREATININE 1.14 09/20/2018   BUN 25 (H) 09/20/2018   CO2 25 09/20/2018   TSH 4.83 (H) 09/20/2018   INR 1.01 08/31/2013   HGBA1C 6.6 (H) 09/20/2018       Assessment & Plan:

## 2018-09-20 NOTE — Telephone Encounter (Signed)
Noted  Pt currently in office for a visit to discuss.

## 2018-09-20 NOTE — Assessment & Plan Note (Signed)
stable overall by history and exam, recent data reviewed with pt, and pt to continue medical treatment as before,  to f/u any worsening symptoms or concerns, for a1c with labs 

## 2018-09-20 NOTE — Assessment & Plan Note (Signed)
Mild, no cellulitis, for triam cr prn

## 2018-09-20 NOTE — Patient Instructions (Signed)
Please take all new medication as prescribed - the steroid cream Please continue all other medications as before, and refills have been done if requested.  Please have the pharmacy call with any other refills you may need.  Please continue your efforts at being more active, low cholesterol diet, and weight control.  You are otherwise up to date with prevention measures today.  Please keep your appointments with your specialists as you may have planned  Please go to the LAB in the Basement (turn left off the elevator) for the tests to be done today  You will be contacted by phone if any changes need to be made immediately.  Otherwise, you will receive a letter about your results with an explanation, but please check with MyChart first.  Please remember to sign up for MyChart if you have not done so, as this will be important to you in the future with finding out test results, communicating by private email, and scheduling acute appointments online when needed.  Please return in 6 months, or sooner if needed

## 2018-09-20 NOTE — Telephone Encounter (Signed)
Pt called stating that he received a bug bite while waiting for an oil change on Wednesday.  He say he is unsure what bite him but he has 2 small white marks on his skin. He says today his arm is turning red in and is burning pain. He rates the pain at 6. It itches severely. Care advice read to patient. Pt verbalized understanding of instructions. Call transferred to office for appointment.  Reason for Disposition . [1] Red or very tender (to touch) area AND [2] getting larger over 48 hours after the bite  Answer Assessment - Initial Assessment Questions 1. TYPE of INSECT: "What type of insect was it?"     unsure 2. ONSET: "When did you get bitten?"      Wednesday  3. LOCATION: "Where is the insect bite located?"      Rt arm just above the elbow 4. REDNESS: "Is the area red or pink?" If so, ask "What size is area of redness?" (inches or cm). "When did the redness start?"     About 2 inch red oval 5. PAIN: "Is there any pain?" If so, ask: "How bad is it?"  (Scale 1-10; or mild, moderate, severe)     Burning 6 6. ITCHING: "Does it itch?" If so, ask: "How bad is the itch?"    - MILD: doesn't interfere with normal activities   - MODERATE - SEVERE: interferes with work, school, sleep, or other activities      Yes severe 7. SWELLING: "How big is the swelling?" (inches, cm, or compare to coins)     unsure 8. OTHER SYMPTOMS: "Do you have any other symptoms?"  (e.g., difficulty breathing, hives)    No 9. PREGNANCY: "Is there any chance you are pregnant?" "When was your last menstrual period?"     N/A  Protocols used: INSECT BITE-A-AH

## 2018-10-01 ENCOUNTER — Telehealth: Payer: Self-pay | Admitting: Cardiology

## 2018-10-01 MED ORDER — RAMIPRIL 10 MG PO CAPS: 10.0000 mg | ORAL_CAPSULE | Freq: Every day | ORAL | 3 refills | Status: DC

## 2018-10-01 NOTE — Telephone Encounter (Signed)
Spoke with pt who report he was giving the office a heads up that [facility] will be sending a refill request for Altace 10 mg. Informed pt that Nurse will go ahead and send Rx. Pt voiced understanding.

## 2018-10-01 NOTE — Telephone Encounter (Signed)
New Message           Patient is calling to give a heads up that he sent a refill in to [facility]. Pls advise

## 2018-10-02 ENCOUNTER — Other Ambulatory Visit: Payer: Self-pay

## 2018-10-02 MED ORDER — RAMIPRIL 10 MG PO CAPS: 10.0000 mg | ORAL_CAPSULE | Freq: Every day | ORAL | 3 refills | Status: DC

## 2018-10-15 ENCOUNTER — Encounter: Payer: Medicare Other | Attending: Registered Nurse | Admitting: Registered Nurse

## 2018-10-15 ENCOUNTER — Encounter: Payer: Self-pay | Admitting: Registered Nurse

## 2018-10-15 ENCOUNTER — Other Ambulatory Visit: Payer: Self-pay

## 2018-10-15 VITALS — BP 145/77 | HR 73 | Temp 97.7°F | Ht 68.5 in | Wt 141.0 lb

## 2018-10-15 DIAGNOSIS — I701 Atherosclerosis of renal artery: Secondary | ICD-10-CM | POA: Diagnosis not present

## 2018-10-15 DIAGNOSIS — Z5181 Encounter for therapeutic drug level monitoring: Secondary | ICD-10-CM | POA: Diagnosis not present

## 2018-10-15 DIAGNOSIS — M48062 Spinal stenosis, lumbar region with neurogenic claudication: Secondary | ICD-10-CM

## 2018-10-15 DIAGNOSIS — M1711 Unilateral primary osteoarthritis, right knee: Secondary | ICD-10-CM | POA: Diagnosis not present

## 2018-10-15 DIAGNOSIS — G894 Chronic pain syndrome: Secondary | ICD-10-CM | POA: Insufficient documentation

## 2018-10-15 DIAGNOSIS — M62838 Other muscle spasm: Secondary | ICD-10-CM | POA: Diagnosis not present

## 2018-10-15 DIAGNOSIS — Z79899 Other long term (current) drug therapy: Secondary | ICD-10-CM | POA: Insufficient documentation

## 2018-10-15 DIAGNOSIS — M25561 Pain in right knee: Secondary | ICD-10-CM | POA: Diagnosis not present

## 2018-10-15 DIAGNOSIS — M1611 Unilateral primary osteoarthritis, right hip: Secondary | ICD-10-CM | POA: Diagnosis not present

## 2018-10-15 MED ORDER — HYDROCODONE-ACETAMINOPHEN 5-325 MG PO TABS: 1.0000 | ORAL_TABLET | Freq: Three times a day (TID) | ORAL | 0 refills | Status: DC

## 2018-10-15 NOTE — Progress Notes (Signed)
Subjective:    Patient ID: Kristopher [name], male    DOB: [birth year], 85 y.o.   MRN: [id number]  HPI: Kristopher [name] is a 85 y.o. male who returns for follow up appointment for chronic pain and medication refill. He states his pain is located in his right hip and right knee. He rates his  Pain 2. His current exercise regime is walking , performing stretching exercises and mowing his lawn with self-propel mower.   Kristopher [name] Morphine equivalent is 15.00 MME.  Last Oral Swab was Performed on 11/08/2017, it was consistent.   Pain Inventory Average Pain 3 Pain Right Now 2 My pain is dull, stabbing and aching  In the last 24 hours, has pain interfered with the following? General activity 3 Relation with others 3 Enjoyment of life 3 What TIME of day is your pain at its worst? evening Sleep (in general) Fair  Pain is worse with: bending Pain improves with: medication Relief from Meds: n/a  Mobility use a cane ability to climb steps?  yes do you drive?  yes  Function retired  Neuro/Psych No problems in this area  Prior Studies no  Physicians involved in your care no   Family History  Problem Relation Age of Onset  . Heart disease Mother   . Heart disease Father    Social History   Socioeconomic History  . Marital status: Widowed    Spouse name: Not on file  . Number of children: Not on file  . Years of education: GED  . Highest education level: Not on file  Occupational History  . Occupation: retired  Scientific laboratory technician  . Financial resource strain: Not on file  . Food insecurity    Worry: Not on file    Inability: Not on file  . Transportation needs    Medical: Not on file    Non-medical: Not on file  Tobacco Use  . Smoking status: Former Smoker    Packs/day: 3.00    Years: 38.00    Pack years: 114.00    Quit date: 04/10/1988    Years since quitting: 30.5  . Smokeless tobacco: Never Used  Substance and Sexual Activity  . Alcohol use: Yes    Comment:  05/21/2017 "might drink 6 beers/year"  . Drug use: No  . Sexual activity: Not on file  Lifestyle  . Physical activity    Days per week: Not on file    Minutes per session: Not on file  . Stress: Not on file  Relationships  . Social Herbalist on phone: Not on file    Gets together: Not on file    Attends religious service: Not on file    Active member of club or organization: Not on file    Attends meetings of clubs or organizations: Not on file    Relationship status: Not on file  Other Topics Concern  . Not on file  Social History Narrative  . Not on file   Past Surgical History:  Procedure Laterality Date  . CATARACT EXTRACTION W/ INTRAOCULAR LENS  IMPLANT, BILATERAL Bilateral YRS AGO  . CORONARY ANGIOPLASTY WITH STENT PLACEMENT  05/1996   PCI of LAD; "I've got a total of 4 stents" (05/21/2017)  . [place]  . LOWER EXTREMITY ANGIOGRAM Bilateral 09/20/2011   Procedure: LOWER EXTREMITY ANGIOGRAM;  Surgeon: Wellington Hampshire, MD;  Location: [facility];  Service: Cardiovascular;  Laterality: Bilateral;  . TRANSURETHRAL RESECTION OF PROSTATE  08/11/2011   Procedure: TRANSURETHRAL RESECTION OF THE PROSTATE WITH GYRUS INSTRUMENTS;  Surgeon: Fredricka Bonine, MD;  Location: [facility];  Service: Urology;  Laterality: N/A;     . VASECTOMY  1968   Past Medical History:  Diagnosis Date  . AAA (abdominal aortic aneurysm) ([employer])    ultrasound 07/2011:  2.7 x 2.8 cm  . Acute myocardial infarction, unspecified site, episode of care unspecified 1998  . Aortic stenosis    echo 4/12: normal LVF, mild LAE, no significant AS  . Arthritis    "all over" (05/21/2017)  . CAD (coronary artery disease)    s/p PCI of LAD 1998;  s/p DES to RCA 05/2009;  Myoview 4/13: low risk, no ischemia, EF 56%  . CAP (community acquired pneumonia) 05/21/2017  . COPD (chronic obstructive pulmonary disease) ([place])   . DJD (degenerative joint disease), lumbar 07/06/2011  . Esophageal reflux    . HLD (hyperlipidemia)   . HTN (hypertension)   . PAD (peripheral artery disease) ([place])    s/p L CIA stent 09/2011 ([place])  . Pneumonia    "I've always had pneumonia; since I was 25 months old" (05/21/2017)  . Renal artery stenosis (HCC)    s/p bilat RA stents  . Urine incontinence    There were no vitals taken for this visit.  Opioid Risk Score:   Fall Risk Score:  `1  Depression screen PHQ 2/9  Depression screen [facility] 2/9 09/20/2018 09/12/2018 04/11/2018 02/12/2018 01/09/2018 11/30/2017 08/06/2017  Decreased Interest 0 0 0 0 0 0 0  Down, Depressed, Hopeless 0 0 0 0 0 0 0  PHQ - 2 Score 0 0 0 0 0 0 0  Altered sleeping - - - - - - -  Tired, decreased energy - - - - - - -  Change in appetite - - - - - - -  Feeling bad or failure about yourself  - - - - - - -  Trouble concentrating - - - - - - -  Moving slowly or fidgety/restless - - - - - - -  Suicidal thoughts - - - - - - -  PHQ-9 Score - - - - - - -     Review of Systems  All other systems reviewed and are negative.      Objective:   Physical Exam Vitals signs and nursing note reviewed.  Constitutional:      Appearance: Normal appearance.  Neck:     Musculoskeletal: Normal range of motion and neck supple.  Cardiovascular:     Rate and Rhythm: Normal rate and regular rhythm.     Pulses: Normal pulses.     Heart sounds: Normal heart sounds.  Pulmonary:     Effort: Pulmonary effort is normal.     Breath sounds: Normal breath sounds.  Musculoskeletal:     Comments: Normal Muscle Bulk and Muscle Testing Reveals:  Upper Extremities: Full ROM and Muscle Strength 5/5   Lower Extremities: Right: Decreased ROM and Muscle Strength 5/5 Right Lower Extremity Flexion Produces Pain into right patella Left: Full ROM and Muscle Strength 5/5 Arises from chair slowly using cane for support Antalgic  Gait   Skin:    General: Skin is warm and dry.  Neurological:     Mental Status: He is alert and oriented to person, place, and time.   Psychiatric:        Mood and Affect: Mood normal.        Behavior: Behavior normal.  Assessment & Plan:  1. Patellofemoral arthritis. Continue Voltaren gel, and continue with exercise regimen.10/15/2018 2 Chronic Low Back Pain/ Lumbar Stenosis:Continue HEP as Tolerated and Continue to Monitor.Continue current medication regimen.10/15/2018 3. Chronic Pain: Refilled: Hydrocodone 5/325 mg one tablet three times a day #90.10/15/2018. We will continue the opioid monitoring program, this consists of regular clinic visits, examinations, urine drug screen, pill counts as well as use of [place] Controlled Substance reporting System. 4. Muscle Spasm: Continuecurrent medication regimen withFlexeril. 10/15/2018 5. Primary Osteoarthritis of Right Hip: Continue to Alternate with Ice and Heat Therapy. Continue to Monitor. 10/15/2018 6. Idiopathic Peripheral Neuropathy:No Complaints today.Refuses Gabapentin or Amitriptylline at this time. Continue to Monitor.10/15/2018  F/U in 1 month  15 minutes of face to face patient care time was spent during this visit. All questions were encouraged and answered.

## 2018-10-17 ENCOUNTER — Other Ambulatory Visit (HOSPITAL_COMMUNITY): Payer: Self-pay | Admitting: Cardiology

## 2018-10-17 ENCOUNTER — Ambulatory Visit (HOSPITAL_COMMUNITY)
Admission: RE | Admit: 2018-10-17 | Discharge: 2018-10-17 | Disposition: A | Discharge status: Home / Self Care | Payer: Medicare Other | Source: Ambulatory Visit | Attending: Cardiology | Admitting: Cardiology

## 2018-10-17 ENCOUNTER — Other Ambulatory Visit: Payer: Self-pay

## 2018-10-17 DIAGNOSIS — I714 Abdominal aortic aneurysm, without rupture, unspecified: Secondary | ICD-10-CM

## 2018-10-17 DIAGNOSIS — I701 Atherosclerosis of renal artery: Secondary | ICD-10-CM | POA: Insufficient documentation

## 2018-10-17 DIAGNOSIS — I77819 Aortic ectasia, unspecified site: Secondary | ICD-10-CM

## 2018-10-17 DIAGNOSIS — I739 Peripheral vascular disease, unspecified: Secondary | ICD-10-CM

## 2018-10-18 ENCOUNTER — Other Ambulatory Visit: Payer: Self-pay | Admitting: *Deleted

## 2018-10-18 DIAGNOSIS — I714 Abdominal aortic aneurysm, without rupture, unspecified: Secondary | ICD-10-CM

## 2018-10-28 ENCOUNTER — Other Ambulatory Visit: Payer: Self-pay | Admitting: Internal Medicine

## 2018-11-19 ENCOUNTER — Other Ambulatory Visit: Payer: Self-pay

## 2018-11-19 ENCOUNTER — Encounter: Payer: Medicare Other | Attending: Registered Nurse | Admitting: Registered Nurse

## 2018-11-19 ENCOUNTER — Encounter: Payer: Self-pay | Admitting: Registered Nurse

## 2018-11-19 VITALS — BP 143/82 | HR 70 | Temp 97.5°F | Resp 16 | Ht 68.0 in | Wt 143.4 lb

## 2018-11-19 DIAGNOSIS — M62838 Other muscle spasm: Secondary | ICD-10-CM

## 2018-11-19 DIAGNOSIS — M25561 Pain in right knee: Secondary | ICD-10-CM | POA: Diagnosis not present

## 2018-11-19 DIAGNOSIS — Z79899 Other long term (current) drug therapy: Secondary | ICD-10-CM | POA: Diagnosis not present

## 2018-11-19 DIAGNOSIS — M1711 Unilateral primary osteoarthritis, right knee: Secondary | ICD-10-CM | POA: Diagnosis not present

## 2018-11-19 DIAGNOSIS — I701 Atherosclerosis of renal artery: Secondary | ICD-10-CM | POA: Diagnosis not present

## 2018-11-19 DIAGNOSIS — G894 Chronic pain syndrome: Secondary | ICD-10-CM | POA: Insufficient documentation

## 2018-11-19 DIAGNOSIS — M1611 Unilateral primary osteoarthritis, right hip: Secondary | ICD-10-CM | POA: Insufficient documentation

## 2018-11-19 DIAGNOSIS — Z5181 Encounter for therapeutic drug level monitoring: Secondary | ICD-10-CM

## 2018-11-19 DIAGNOSIS — M48062 Spinal stenosis, lumbar region with neurogenic claudication: Secondary | ICD-10-CM | POA: Diagnosis not present

## 2018-11-19 MED ORDER — HYDROCODONE-ACETAMINOPHEN 5-325 MG PO TABS: 1.0000 | ORAL_TABLET | Freq: Three times a day (TID) | ORAL | 0 refills | Status: DC

## 2018-11-19 NOTE — Progress Notes (Signed)
Subjective:    Patient ID: Kristopher [name], male    DOB: [birth year], 85 y.o.   MRN: [id number]  HPI: Kristopher [name] is a 85 y.o. male who returns for follow up appointment for chronic pain and medication refill. He states his pain is located in his right hip, right knee and right foot. He rates his pain 2. His  current exercise regime is walking and performing outside chores mowing with self propel mower.   Kristopher [name] Morphine equivalent is 15.00 MME.  Last Oral Swab was Performed on 11/08/2008, it was consistent.   Pain Inventory Average Pain 3 Pain Right Now 2 My pain is dull  In the last 24 hours, has pain interfered with the following? General activity 3 Relation with others 3 Enjoyment of life 3 What TIME of day is your pain at its worst? daytime Sleep (in general) Fair  Pain is worse with: steps Pain improves with: medication Relief from Meds: 4  Mobility walk with assistance use a cane use a walker how many minutes can you walk? 10 ability to climb steps?  yes do you drive?  yes use a wheelchair  Function retired  Neuro/Psych weakness  Prior Studies Any changes since last visit?  no  Physicians involved in your care Any changes since last visit?  no   Family History  Problem Relation Age of Onset  . Heart disease Mother   . Heart disease Father    Social History   Socioeconomic History  . Marital status: Widowed    Spouse name: Not on file  . Number of children: Not on file  . Years of education: GED  . Highest education level: Not on file  Occupational History  . Occupation: retired  Scientific laboratory technician  . Financial resource strain: Not on file  . Food insecurity    Worry: Not on file    Inability: Not on file  . Transportation needs    Medical: Not on file    Non-medical: Not on file  Tobacco Use  . Smoking status: Former Smoker    Packs/day: 3.00    Years: 38.00    Pack years: 114.00    Quit date: 04/10/1988    Years since quitting: 30.6  .  Smokeless tobacco: Never Used  Substance and Sexual Activity  . Alcohol use: Yes    Comment: 05/21/2017 "might drink 6 beers/year"  . Drug use: No  . Sexual activity: Not on file  Lifestyle  . Physical activity    Days per week: Not on file    Minutes per session: Not on file  . Stress: Not on file  Relationships  . Social Herbalist on phone: Not on file    Gets together: Not on file    Attends religious service: Not on file    Active member of club or organization: Not on file    Attends meetings of clubs or organizations: Not on file    Relationship status: Not on file  Other Topics Concern  . Not on file  Social History Narrative  . Not on file   Past Surgical History:  Procedure Laterality Date  . CATARACT EXTRACTION W/ INTRAOCULAR LENS  IMPLANT, BILATERAL Bilateral YRS AGO  . CORONARY ANGIOPLASTY WITH STENT PLACEMENT  05/1996   PCI of LAD; "I've got a total of 4 stents" (05/21/2017)  . [place]  . LOWER EXTREMITY ANGIOGRAM Bilateral 09/20/2011   Procedure: LOWER EXTREMITY ANGIOGRAM;  Surgeon:  Wellington Hampshire, MD;  Location: [place];  Service: Cardiovascular;  Laterality: Bilateral;  . TRANSURETHRAL RESECTION OF PROSTATE  08/11/2011   Procedure: TRANSURETHRAL RESECTION OF THE PROSTATE WITH GYRUS INSTRUMENTS;  Surgeon: Fredricka Bonine, MD;  [facility];  Service: Urology;  Laterality: N/A;     . VASECTOMY  1968   Past Medical History:  Diagnosis Date  . AAA (abdominal aortic aneurysm) ([place])    ultrasound 07/2011:  2.7 x 2.8 cm  . Acute myocardial infarction, unspecified site, episode of care unspecified 1998  . Aortic stenosis    echo 4/12: normal LVF, mild LAE, no significant AS  . Arthritis    "all over" (05/21/2017)  . CAD (coronary artery disease)    s/p PCI of LAD 1998;  s/p DES to RCA 05/2009;  Myoview 4/13: low risk, no ischemia, EF 56%  . CAP (community acquired pneumonia) 05/21/2017  . COPD (chronic obstructive  pulmonary disease) ([place])   . DJD (degenerative joint disease), lumbar 07/06/2011  . Esophageal reflux   . HLD (hyperlipidemia)   . HTN (hypertension)   . PAD (peripheral artery disease) ([place])    s/p L CIA stent 09/2011 ([place])  . Pneumonia    "I've always had pneumonia; since I was 66 months old" (05/21/2017)  . Renal artery stenosis (HCC)    s/p bilat RA stents  . Urine incontinence    There were no vitals taken for this visit.  Opioid Risk Score:   Fall Risk Score:  `1  Depression screen PHQ 2/9  Depression screen [facility] 2/9 09/20/2018 09/12/2018 04/11/2018 02/12/2018 01/09/2018 11/30/2017 08/06/2017  Decreased Interest 0 0 0 0 0 0 0  Down, Depressed, Hopeless 0 0 0 0 0 0 0  PHQ - 2 Score 0 0 0 0 0 0 0  Altered sleeping - - - - - - -  Tired, decreased energy - - - - - - -  Change in appetite - - - - - - -  Feeling bad or failure about yourself  - - - - - - -  Trouble concentrating - - - - - - -  Moving slowly or fidgety/restless - - - - - - -  Suicidal thoughts - - - - - - -  PHQ-9 Score - - - - - - -     Review of Systems  Constitutional: Negative.   HENT: Negative.   Eyes: Negative.   Respiratory: Negative.   Gastrointestinal: Negative.   Endocrine: Negative.   Genitourinary: Negative.   Musculoskeletal: Positive for gait problem.  Skin: Negative.   Allergic/Immunologic: Negative.   Neurological: Positive for weakness.  Hematological: Negative.   Psychiatric/Behavioral: Negative.        Objective:   Physical Exam Vitals signs and nursing note reviewed.  Constitutional:      Appearance: Normal appearance.  Neck:     Musculoskeletal: Normal range of motion and neck supple.  Cardiovascular:     Rate and Rhythm: Normal rate and regular rhythm.     Pulses: Normal pulses.     Heart sounds: Normal heart sounds.  Pulmonary:     Effort: Pulmonary effort is normal.     Breath sounds: Normal breath sounds.  Musculoskeletal:     Comments: Normal Muscle Bulk and Muscle  Testing Reveals:  Upper Extremities: Full ROM and Muscle Strength 5/5  Lower Extremities: Right: Decreased ROM and Muscle Strength 4/5 and Right Lower Extremity Flexion Produces Pain into Right Patella Left: Full ROM  and Muscle Strength 5/5 Arises from chair with ease using cane for support Antalgic Gait   Skin:    General: Skin is warm and dry.  Neurological:     Mental Status: He is alert and oriented to person, place, and time.  Psychiatric:        Mood and Affect: Mood normal.        Behavior: Behavior normal.           Assessment & Plan:  1. Patellofemoral arthritis. Continue Voltaren gel, and continue with exercise regimen.11/19/2018 2 Chronic Low Back Pain/ Lumbar Stenosis:Continue HEP as Tolerated and Continue to Monitor.Continue current medication regimen.11/19/2018 3. Chronic Pain:Slow Weaning:  Refilled: Hydrocodone 5/325 mg one tablet three times a day #80.11/19/2018. We will continue the opioid monitoring program, this consists of regular clinic visits, examinations, urine drug screen, pill counts as well as use of [place] Controlled Substance reporting System. 4. Muscle Spasm: Continuecurrent medication regimen withFlexeril. 11/19/2018 5. Primary Osteoarthritis of Right Hip: Continue to Alternate with Ice and Heat Therapy. Continue to Monitor. 11/19/2018 6. Idiopathic Peripheral Neuropathy:No Complaints today.Refuses Gabapentin or Amitriptylline at this time. Continue to Monitor.11/19/2018  F/U in 1 month  15 minutes of face to face patient care time was spent during this visit. All questions were encouraged and answered.

## 2018-12-31 ENCOUNTER — Encounter: Payer: Medicare Other | Attending: Registered Nurse | Admitting: Registered Nurse

## 2018-12-31 ENCOUNTER — Encounter: Payer: Self-pay | Admitting: Registered Nurse

## 2018-12-31 ENCOUNTER — Other Ambulatory Visit: Payer: Self-pay

## 2018-12-31 VITALS — BP 127/75 | HR 69 | Temp 97.7°F | Ht 68.0 in | Wt 145.0 lb

## 2018-12-31 DIAGNOSIS — M1611 Unilateral primary osteoarthritis, right hip: Secondary | ICD-10-CM | POA: Insufficient documentation

## 2018-12-31 DIAGNOSIS — M1711 Unilateral primary osteoarthritis, right knee: Secondary | ICD-10-CM | POA: Insufficient documentation

## 2018-12-31 DIAGNOSIS — Z5181 Encounter for therapeutic drug level monitoring: Secondary | ICD-10-CM | POA: Diagnosis not present

## 2018-12-31 DIAGNOSIS — M48062 Spinal stenosis, lumbar region with neurogenic claudication: Secondary | ICD-10-CM

## 2018-12-31 DIAGNOSIS — Z79891 Long term (current) use of opiate analgesic: Secondary | ICD-10-CM

## 2018-12-31 DIAGNOSIS — Z79899 Other long term (current) drug therapy: Secondary | ICD-10-CM | POA: Insufficient documentation

## 2018-12-31 DIAGNOSIS — M25561 Pain in right knee: Secondary | ICD-10-CM

## 2018-12-31 DIAGNOSIS — I701 Atherosclerosis of renal artery: Secondary | ICD-10-CM | POA: Diagnosis not present

## 2018-12-31 DIAGNOSIS — G894 Chronic pain syndrome: Secondary | ICD-10-CM | POA: Insufficient documentation

## 2018-12-31 NOTE — Progress Notes (Signed)
Subjective:    Patient ID: Kristopher [name], male    DOB: 07-01-1930, 85 y.o.   MRN: [id number]  HPI: Kristopher [name] is a 85 y.o. male who returns for follow up appointment for chronic pain and medication refill. He states his pain is located in his lower back and right knee pain. He rates his  Pain 2. His  current exercise regime is walking, stationary bicycle for 5 minutes 2-3 times a day and Pulley Exercise Bike performing 20 reps three times a day.    Kristopher [name] Morphine equivalent is 15.38 MME.  Oral Swab Performed Today.   Pain Inventory Average Pain 2 Pain Right Now 2 My pain is dull  In the last 24 hours, has pain interfered with the following? General activity 2 Relation with others 2 Enjoyment of life 2 What TIME of day is your pain at its worst? morning Sleep (in general) Fair  Pain is worse with: unsure Pain improves with: rest and medication Relief from Meds: 8  Mobility walk with assistance use a cane ability to climb steps?  yes Do you have any goals in this area?  no  Function Do you have any goals in this area?  no  Neuro/Psych trouble walking  Prior Studies Any changes since last visit?  no  Physicians involved in your care Any changes since last visit?  no   Family History  Problem Relation Age of Onset  . Heart disease Mother   . Heart disease Father    Social History   Socioeconomic History  . Marital status: Widowed    Spouse name: Not on file  . Number of children: Not on file  . Years of education: GED  . Highest education level: Not on file  Occupational History  . Occupation: retired  Scientific laboratory technician  . Financial resource strain: Not on file  . Food insecurity    Worry: Not on file    Inability: Not on file  . Transportation needs    Medical: Not on file    Non-medical: Not on file  Tobacco Use  . Smoking status: Former Smoker    Packs/day: 3.00    Years: 38.00    Pack years: 114.00    Quit date: 04/10/1988    Years since  quitting: 30.7  . Smokeless tobacco: Never Used  Substance and Sexual Activity  . Alcohol use: Yes    Comment: 05/21/2017 "might drink 6 beers/year"  . Drug use: No  . Sexual activity: Not on file  Lifestyle  . Physical activity    Days per week: Not on file    Minutes per session: Not on file  . Stress: Not on file  Relationships  . Social Herbalist on phone: Not on file    Gets together: Not on file    Attends religious service: Not on file    Active member of club or organization: Not on file    Attends meetings of clubs or organizations: Not on file    Relationship status: Not on file  Other Topics Concern  . Not on file  Social History Narrative  . Not on file   Past Surgical History:  Procedure Laterality Date  . CATARACT EXTRACTION W/ INTRAOCULAR LENS  IMPLANT, BILATERAL Bilateral YRS AGO  . CORONARY ANGIOPLASTY WITH STENT PLACEMENT  05/1996   PCI of LAD; "I've got a total of 4 stents" (05/21/2017)  . [place]  . LOWER EXTREMITY ANGIOGRAM  Bilateral 09/20/2011   Procedure: LOWER EXTREMITY ANGIOGRAM;  Surgeon: Wellington Hampshire, MD;  Location: [place] [facility];  Service: Cardiovascular;  Laterality: Bilateral;  . TRANSURETHRAL RESECTION OF PROSTATE  08/11/2011   Procedure: TRANSURETHRAL RESECTION OF THE PROSTATE WITH GYRUS INSTRUMENTS;  Surgeon: Fredricka Bonine, MD;  [facility];  Service: Urology;  Laterality: N/A;     . VASECTOMY  1968   Past Medical History:  Diagnosis Date  . AAA (abdominal aortic aneurysm) ([place])    ultrasound 07/2011:  2.7 x 2.8 cm  . Acute myocardial infarction, unspecified site, episode of care unspecified 1998  . Aortic stenosis    echo 4/12: normal LVF, mild LAE, no significant AS  . Arthritis    "all over" (05/21/2017)  . CAD (coronary artery disease)    s/p PCI of LAD 1998;  s/p DES to RCA 05/2009;  Myoview 4/13: low risk, no ischemia, EF 56%  . CAP (community acquired pneumonia) 05/21/2017  . COPD (chronic  obstructive pulmonary disease) ([place])   . DJD (degenerative joint disease), lumbar 07/06/2011  . Esophageal reflux   . HLD (hyperlipidemia)   . HTN (hypertension)   . PAD (peripheral artery disease) ([place])    s/p L CIA stent 09/2011 ([place])  . Pneumonia    "I've always had pneumonia; since I was 31 months old" (05/21/2017)  . Renal artery stenosis ([facility]CC)    s/p bilat RA stents  . Urine incontinence    BP 127/75   Pulse 69   Temp 97.7 F (36.5 C)   Ht 5\' 8"  (1.727 m)   Wt 145 lb (65.8 kg)   SpO2 98%   BMI 22.05 kg/m   Opioid Risk Score:   Fall Risk Score:  `1  Depression screen PHQ 2/9  Depression screen [place] 2/9 09/20/2018 09/12/2018 04/11/2018 02/12/2018 01/09/2018 11/30/2017 08/06/2017  Decreased Interest 0 0 0 0 0 0 0  Down, Depressed, Hopeless 0 0 0 0 0 0 0  PHQ - 2 Score 0 0 0 0 0 0 0  Altered sleeping - - - - - - -  Tired, decreased energy - - - - - - -  Change in appetite - - - - - - -  Feeling bad or failure about yourself  - - - - - - -  Trouble concentrating - - - - - - -  Moving slowly or fidgety/restless - - - - - - -  Suicidal thoughts - - - - - - -  PHQ-9 Score - - - - - - -    Review of Systems  Constitutional: Negative.   HENT: Negative.   Eyes: Negative.   Respiratory: Negative.   Cardiovascular: Negative.   Gastrointestinal: Negative.   Endocrine: Negative.   Genitourinary: Negative.   Musculoskeletal: Positive for arthralgias, back pain and gait problem.  Skin: Negative.   Allergic/Immunologic: Negative.   Hematological: Negative.   Psychiatric/Behavioral: Negative.   All other systems reviewed and are negative.      Objective:   Physical Exam Vitals signs and nursing note reviewed.  Constitutional:      Appearance: Normal appearance.  Neck:     Musculoskeletal: Neck supple.  Cardiovascular:     Rate and Rhythm: Normal rate and regular rhythm.     Pulses: Normal pulses.     Heart sounds: Normal heart sounds.  Pulmonary:     Effort: Pulmonary  effort is normal.     Breath sounds: Normal breath sounds.  Musculoskeletal:  Comments: Normal Muscle Bulk and Muscle Testing Reveals:  Upper Extremities: Full  ROM and Muscle Strength 5/5 Thoracic, Lumbar Lower Extremities Gait   Skin:    General: Skin is warm and dry.  Neurological:     Mental Status: He is alert and oriented to person, place, and time.  Psychiatric:        Mood and Affect: Mood normal.        Behavior: Behavior normal.           Assessment & Plan:  1. Patellofemoral arthritis. Continue Voltaren gel, and continue with exercise regimen.12/31/2018 2 Chronic Low Back Pain/ Lumbar Stenosis:Continue HEP as Tolerated and Continue to Monitor.Continue current medication regimen.12/31/2018 3. Chronic Pain:Slow Weaning: Continue :No script given today.  Hydrocodone 5/325 mg one tablet three times a day #80.12/31/2018. We will continue the opioid monitoring program, this consists of regular clinic visits, examinations, urine drug screen, pill counts as well as use of [place] Controlled Substance reporting System. 4. Muscle Spasm: Continuecurrent medication regimen withFlexeril. 12/31/2018 5. Primary Osteoarthritis of Right Hip: Continue to Alternate with Ice and Heat Therapy. Continue to Monitor. 12/31/2018 6. Idiopathic Peripheral Neuropathy:No Complaints today.Refuses Gabapentin or Amitriptylline at this time. Continue to Monitor.12/31/2018  F/U in 1 month  41minutes of face to face patient care time was spent during this visit. All questions were encouraged and answered.

## 2019-01-03 LAB — DRUG TOX MONITOR 1 W/CONF, ORAL FLD

## 2019-01-03 LAB — DRUG TOX ALC METAB W/CON, ORAL FLD: Alcohol Metabolite: NEGATIVE ng/mL (ref <25)

## 2019-01-08 ENCOUNTER — Telehealth: Payer: Self-pay | Admitting: *Deleted

## 2019-01-08 NOTE — Telephone Encounter (Signed)
Oral drug screen was negative for medications. This would be consistent as he reported on 12/31/18 his last dose take was on 12/27/18.

## 2019-02-06 ENCOUNTER — Encounter: Payer: Medicare Other | Attending: Registered Nurse | Admitting: Registered Nurse

## 2019-02-06 ENCOUNTER — Encounter: Payer: Self-pay | Admitting: Registered Nurse

## 2019-02-06 ENCOUNTER — Other Ambulatory Visit: Payer: Self-pay

## 2019-02-06 VITALS — BP 119/87 | HR 75 | Temp 97.5°F | Ht 68.0 in | Wt 144.4 lb

## 2019-02-06 DIAGNOSIS — Z5181 Encounter for therapeutic drug level monitoring: Secondary | ICD-10-CM | POA: Diagnosis not present

## 2019-02-06 DIAGNOSIS — I701 Atherosclerosis of renal artery: Secondary | ICD-10-CM

## 2019-02-06 DIAGNOSIS — M1611 Unilateral primary osteoarthritis, right hip: Secondary | ICD-10-CM

## 2019-02-06 DIAGNOSIS — Z79891 Long term (current) use of opiate analgesic: Secondary | ICD-10-CM | POA: Diagnosis not present

## 2019-02-06 DIAGNOSIS — M1711 Unilateral primary osteoarthritis, right knee: Secondary | ICD-10-CM | POA: Insufficient documentation

## 2019-02-06 DIAGNOSIS — M48062 Spinal stenosis, lumbar region with neurogenic claudication: Secondary | ICD-10-CM

## 2019-02-06 DIAGNOSIS — Z79899 Other long term (current) drug therapy: Secondary | ICD-10-CM | POA: Diagnosis not present

## 2019-02-06 DIAGNOSIS — G894 Chronic pain syndrome: Secondary | ICD-10-CM

## 2019-02-06 DIAGNOSIS — M25561 Pain in right knee: Secondary | ICD-10-CM

## 2019-02-06 NOTE — Progress Notes (Signed)
Subjective:    Patient ID: Kristopher [name], male    DOB: [birth year], 85 y.o.   MRN: [id number]  HPI: Kristopher [name] is a 85 y.o. male who returns for follow up appointment for chronic pain and medication refill. He states his pain is located in his lower back, right hip, right knee and occasionally right groin. He rates his  Pain 2. His current exercise regime is walking and using his glider three times a day and  performing stretching exercises.  Kristopher [name] Morphine equivalent is 15.38  MME.  Last Oral Swab was Performed on 12/31/2018, it was consistent.   Pain Inventory Average Pain 2 Pain Right Now 2 My pain is dull  In the last 24 hours, has pain interfered with the following? General activity 2 Relation with others 2 Enjoyment of life 2 What TIME of day is your pain at its worst? morning Sleep (in general) Fair  Pain is worse with: weight, carrying something Pain improves with: rest and medication Relief from Meds: 9  Mobility walk with assistance use a cane how many minutes can you walk? 8-10 ability to climb steps?  yes do you drive?  yes  Function retired  Neuro/Psych No problems in this area  Prior Studies Any changes since last visit?  no  Physicians involved in your care Any changes since last visit?  no   Family History  Problem Relation Age of Onset  . Heart disease Mother   . Heart disease Father    Social History   Socioeconomic History  . Marital status: Widowed    Spouse name: Not on file  . Number of children: Not on file  . Years of education: GED  . Highest education level: Not on file  Occupational History  . Occupation: retired  Scientific laboratory technician  . Financial resource strain: Not on file  . Food insecurity    Worry: Not on file    Inability: Not on file  . Transportation needs    Medical: Not on file    Non-medical: Not on file  Tobacco Use  . Smoking status: Former Smoker    Packs/day: 3.00    Years: 38.00    Pack years: 114.00     Quit date: 04/10/1988    Years since quitting: 30.8  . Smokeless tobacco: Never Used  Substance and Sexual Activity  . Alcohol use: Yes    Comment: 05/21/2017 "might drink 6 beers/year"  . Drug use: No  . Sexual activity: Not on file  Lifestyle  . Physical activity    Days per week: Not on file    Minutes per session: Not on file  . Stress: Not on file  Relationships  . Social Herbalist on phone: Not on file    Gets together: Not on file    Attends religious service: Not on file    Active member of club or organization: Not on file    Attends meetings of clubs or organizations: Not on file    Relationship status: Not on file  Other Topics Concern  . Not on file  Social History Narrative  . Not on file   Past Surgical History:  Procedure Laterality Date  . CATARACT EXTRACTION W/ INTRAOCULAR LENS  IMPLANT, BILATERAL Bilateral YRS AGO  . CORONARY ANGIOPLASTY WITH STENT PLACEMENT  05/1996   PCI of LAD; "I've got a total of 4 stents" (05/21/2017)  . [place]  . LOWER EXTREMITY ANGIOGRAM Bilateral  09/20/2011   Procedure: LOWER EXTREMITY ANGIOGRAM;  Surgeon: Wellington Hampshire, MD;  Location: [facility];  Service: Cardiovascular;  Laterality: Bilateral;  . TRANSURETHRAL RESECTION OF PROSTATE  08/11/2011   Procedure: TRANSURETHRAL RESECTION OF THE PROSTATE WITH GYRUS INSTRUMENTS;  Surgeon: Fredricka Bonine, MD;  Location: [facility];  Service: Urology;  Laterality: N/A;     . VASECTOMY  1968   Past Medical History:  Diagnosis Date  . AAA (abdominal aortic aneurysm) ([place])    ultrasound 07/2011:  2.7 x 2.8 cm  . Acute myocardial infarction, unspecified site, episode of care unspecified 1998  . Aortic stenosis    echo 4/12: normal LVF, mild LAE, no significant AS  . Arthritis    "all over" (05/21/2017)  . CAD (coronary artery disease)    s/p PCI of LAD 1998;  s/p DES to RCA 05/2009;  Myoview 4/13: low risk, no ischemia, EF 56%  . CAP (community acquired  pneumonia) 05/21/2017  . COPD (chronic obstructive pulmonary disease) ([place])   . DJD (degenerative joint disease), lumbar 07/06/2011  . Esophageal reflux   . HLD (hyperlipidemia)   . HTN (hypertension)   . PAD (peripheral artery disease) ([place])    s/p L CIA stent 09/2011 ([place])  . Pneumonia    "I've always had pneumonia; since I was 64 months old" (05/21/2017)  . Renal artery stenosis ([facility])    s/p bilat RA stents  . Urine incontinence    BP 119/87   Pulse 75   Temp (!) 97.5 F (36.4 C)   Ht 5\' 8"  (1.727 m)   Wt 144 lb 6.4 oz (65.5 kg)   SpO2 97%   BMI 21.96 kg/m   Opioid Risk Score:   Fall Risk Score:  `1  Depression screen PHQ 2/9  Depression screen [facility] 2/9 09/20/2018 09/12/2018 04/11/2018 02/12/2018 01/09/2018 11/30/2017 08/06/2017  Decreased Interest 0 0 0 0 0 0 0  Down, Depressed, Hopeless 0 0 0 0 0 0 0  PHQ - 2 Score 0 0 0 0 0 0 0  Altered sleeping - - - - - - -  Tired, decreased energy - - - - - - -  Change in appetite - - - - - - -  Feeling bad or failure about yourself  - - - - - - -  Trouble concentrating - - - - - - -  Moving slowly or fidgety/restless - - - - - - -  Suicidal thoughts - - - - - - -  PHQ-9 Score - - - - - - -    Review of Systems  All other systems reviewed and are negative.      Objective:   Physical Exam Vitals signs and nursing note reviewed.  Constitutional:      Appearance: Normal appearance.  Neck:     Musculoskeletal: Normal range of motion and neck supple.  Cardiovascular:     Rate and Rhythm: Normal rate and regular rhythm.     Pulses: Normal pulses.     Heart sounds: Normal heart sounds.  Pulmonary:     Effort: Pulmonary effort is normal.     Breath sounds: Normal breath sounds.  Musculoskeletal:     Comments: Normal Muscle Bulk and Muscle Testing Reveals:  Upper Extremities: Full ROM and Muscle Strength 5/5 Back without spinal tenderness noted  Lower Extremities: Full ROM and Muscle Strength 5/5 Right Lower Extremity Flexion  Produces Pain into Patella Arises from chair with ease Narrow Based  Gait  Skin:    General: Skin is warm and dry.  Neurological:     Mental Status: He is alert and oriented to person, place, and time.  Psychiatric:        Mood and Affect: Mood normal.        Behavior: Behavior normal.           Assessment & Plan:  1. Patellofemoral arthritis. Continue Voltaren gel, and continue with exercise regimen.02/05/2019 2 Chronic Low Back Pain/ Lumbar Stenosis:Continue HEP as Tolerated and Continue to Monitor.Continue current medication regimen.02/05/2019 3. Chronic Pain:Slow Weaning:Continue to Monitor Discussed with Dr. Letta Pate, last prescription was filled on 12/05/2018, he will be allowed to come every three months. Placed a call to Mr. Amber he verbalizes understanding.  :No script given today.Continue Hydrocodone 5/325 mg one tablet three times a day as needed for pain #80.02/05/2019. We will continue the opioid monitoring program, this consists of regular clinic visits, examinations, urine drug screen, pill counts as well as use of [place] Controlled Substance reporting System. 4. Muscle Spasm: Continuecurrent medication regimen withFlexeril. 02/05/2019 5. Primary Osteoarthritis of Right Hip: Continue to Alternate with Ice and Heat Therapy. Continue to Monitor. 02/05/2019 6. Idiopathic Peripheral Neuropathy:No Complaints today.Refuses Gabapentin or Amitriptylline at this time. Continue to Monitor.02/05/2019  F/U in 3 months  49minutes of face to face patient care time was spent during this visit. All questions were encouraged and answered.

## 2019-03-13 ENCOUNTER — Ambulatory Visit: Payer: Medicare Other | Admitting: Registered Nurse

## 2019-03-21 ENCOUNTER — Other Ambulatory Visit: Payer: Self-pay | Admitting: Cardiology

## 2019-03-21 ENCOUNTER — Other Ambulatory Visit: Payer: Self-pay | Admitting: Internal Medicine

## 2019-03-25 ENCOUNTER — Ambulatory Visit: Payer: Medicare Other | Admitting: Internal Medicine

## 2019-04-28 ENCOUNTER — Other Ambulatory Visit: Payer: Self-pay | Admitting: *Deleted

## 2019-04-28 ENCOUNTER — Other Ambulatory Visit: Payer: Self-pay

## 2019-04-28 ENCOUNTER — Encounter: Payer: Self-pay | Admitting: Registered Nurse

## 2019-04-28 ENCOUNTER — Encounter: Payer: Medicare Other | Attending: Registered Nurse | Admitting: Registered Nurse

## 2019-04-28 VITALS — BP 105/73 | HR 77 | Ht 68.0 in | Wt 142.0 lb

## 2019-04-28 DIAGNOSIS — Z5181 Encounter for therapeutic drug level monitoring: Secondary | ICD-10-CM | POA: Diagnosis not present

## 2019-04-28 DIAGNOSIS — M25561 Pain in right knee: Secondary | ICD-10-CM | POA: Diagnosis not present

## 2019-04-28 DIAGNOSIS — M1611 Unilateral primary osteoarthritis, right hip: Secondary | ICD-10-CM | POA: Diagnosis not present

## 2019-04-28 DIAGNOSIS — M48062 Spinal stenosis, lumbar region with neurogenic claudication: Secondary | ICD-10-CM | POA: Diagnosis not present

## 2019-04-28 DIAGNOSIS — Z79891 Long term (current) use of opiate analgesic: Secondary | ICD-10-CM

## 2019-04-28 DIAGNOSIS — G894 Chronic pain syndrome: Secondary | ICD-10-CM | POA: Diagnosis not present

## 2019-04-28 DIAGNOSIS — M1711 Unilateral primary osteoarthritis, right knee: Secondary | ICD-10-CM | POA: Insufficient documentation

## 2019-04-28 DIAGNOSIS — Z79899 Other long term (current) drug therapy: Secondary | ICD-10-CM | POA: Insufficient documentation

## 2019-04-28 DIAGNOSIS — M62838 Other muscle spasm: Secondary | ICD-10-CM | POA: Diagnosis not present

## 2019-04-28 NOTE — Progress Notes (Addendum)
Subjective:    Patient ID: Kristopher [name], male    DOB: 15-Oct-1930, 86 y.o.   MRN: [id number]  HPI: Kristopher [name] is a 86 y.o. male whose appointment was changed to a virtual office visit to reduce the risk of exposure to the COVID-19 virus and to help Kristopher [name] remain healthy and safe. The virtual visit will also provide continuity of care. Kristopher [name] agrees with tele-health visit and verbalizes understanding. He states his pain is located in his lower back and right knee.  He rates his pain 2. His current exercise regime is walking and performing stretching exercises.  Kristopher [name] equivalent is 15.38 MME, last fill was on 12/05/2018, using the hydrocodone sparingly.  Last Oral Swab was Performed on 12/31/2018, it was consistent.   [name] CMA asked The Health and History Questions. This provider and Kristopher [name] verified we were speaking with the correct person using two identifiers.    Pain Inventory Average Pain 2 Pain Right Now 2 My pain is constant and dull  In the last 24 hours, has pain interfered with the following? General activity 2 Relation with others 2 Enjoyment of life 2 What TIME of day is your pain at its worst? evening Sleep (in general) Fair  Pain is worse with: some activites Pain improves with: medication Relief from Meds: 9  Mobility walk with assistance use a cane ability to climb steps?  yes do you drive?  yes  Function retired  Neuro/Psych No problems in this area  Prior Studies Any changes since last visit?  no  Physicians involved in your care Any changes since last visit?  no   Family History  Problem Relation Age of Onset  . Heart disease Mother   . Heart disease Father    Social History   Socioeconomic History  . Marital status: Widowed    Spouse name: Not on file  . Number of children: Not on file  . Years of education: GED  . Highest education level: Not on file  Occupational History  . Occupation: retired    Tobacco Use  . Smoking status: Former Smoker    Packs/day: 3.00    Years: 38.00    Pack years: 114.00    Quit date: 04/10/1988    Years since quitting: 31.0  . Smokeless tobacco: Never Used  Substance and Sexual Activity  . Alcohol use: Yes    Comment: 05/21/2017 "might drink 6 beers/year"  . Drug use: No  . Sexual activity: Not on file  Other Topics Concern  . Not on file  Social History Narrative  . Not on file   Social Determinants of Health   Financial Resource Strain:   . Difficulty of Paying Living Expenses: Not on file  Food Insecurity:   . Worried About Charity fundraiser in the Last Year: Not on file  . Ran Out of Food in the Last Year: Not on file  Transportation Needs:   . Lack of Transportation (Medical): Not on file  . Lack of Transportation (Non-Medical): Not on file  Physical Activity:   . Days of Exercise per Week: Not on file  . Minutes of Exercise per Session: Not on file  Stress:   . Feeling of Stress : Not on file  Social Connections:   . Frequency of Communication with Friends and Family: Not on file  . Frequency of Social Gatherings with Friends and Family: Not on file  . Attends Religious Services: Not on  file  . Active Member of Clubs or Organizations: Not on file  . Attends Archivist Meetings: Not on file  . Marital Status: Not on file   Past Surgical History:  Procedure Laterality Date  . CATARACT EXTRACTION W/ INTRAOCULAR LENS  IMPLANT, BILATERAL Bilateral YRS AGO  . CORONARY ANGIOPLASTY WITH STENT PLACEMENT  05/1996   PCI of LAD; "I've got a total of 4 stents" (05/21/2017)  . [place]  . LOWER EXTREMITY ANGIOGRAM Bilateral 09/20/2011   Procedure: LOWER EXTREMITY ANGIOGRAM;  Surgeon: Wellington Hampshire, MD;  Location: [facility];  Service: Cardiovascular;  Laterality: Bilateral;  . TRANSURETHRAL RESECTION OF PROSTATE  08/11/2011   Procedure: TRANSURETHRAL RESECTION OF THE PROSTATE WITH GYRUS INSTRUMENTS;  Surgeon:  Fredricka Bonine, MD;  Location: [facility];  Service: Urology;  Laterality: N/A;     . VASECTOMY  1968   Past Medical History:  Diagnosis Date  . AAA (abdominal aortic aneurysm) ([place])    ultrasound 07/2011:  2.7 x 2.8 cm  . Acute myocardial infarction, unspecified site, episode of care unspecified 1998  . Aortic stenosis    echo 4/12: normal LVF, mild LAE, no significant AS  . Arthritis    "all over" (05/21/2017)  . CAD (coronary artery disease)    s/p PCI of LAD 1998;  s/p DES to RCA 05/2009;  Myoview 4/13: low risk, no ischemia, EF 56%  . CAP (community acquired pneumonia) 05/21/2017  . COPD (chronic obstructive pulmonary disease) ([place])   . DJD (degenerative joint disease), lumbar 07/06/2011  . Esophageal reflux   . HLD (hyperlipidemia)   . HTN (hypertension)   . PAD (peripheral artery disease) ([place])    s/p L CIA stent 09/2011 ([place])  . Pneumonia    "I've always had pneumonia; since I was 69 months old" (05/21/2017)  . Renal artery stenosis ([facility])    s/p bilat RA stents  . Urine incontinence    There were no vitals taken for this visit.  Opioid Risk Score:   Fall Risk Score:  `1  Depression screen PHQ 2/9  Depression screen [facility] 2/9 09/20/2018 09/12/2018 04/11/2018 02/12/2018 01/09/2018 11/30/2017 08/06/2017  Decreased Interest 0 0 0 0 0 0 0  Down, Depressed, Hopeless 0 0 0 0 0 0 0  PHQ - 2 Score 0 0 0 0 0 0 0  Altered sleeping - - - - - - -  Tired, decreased energy - - - - - - -  Change in appetite - - - - - - -  Feeling bad or failure about yourself  - - - - - - -  Trouble concentrating - - - - - - -  Moving slowly or fidgety/restless - - - - - - -  Suicidal thoughts - - - - - - -  PHQ-9 Score - - - - - - -    Review of Systems  Constitutional: Negative.   HENT: Negative.   Eyes: Negative.   Respiratory: Negative.   Gastrointestinal: Negative.   Endocrine: Negative.   Genitourinary: Negative.   Musculoskeletal: Positive for arthralgias and gait problem.  Skin:  Negative.   Allergic/Immunologic: Negative.   Psychiatric/Behavioral: Negative.   All other systems reviewed and are negative.      Objective:   Physical Exam Vitals and nursing note reviewed.  Musculoskeletal:     Comments: No Physical Exam Performed : Virtual Visit           Assessment & Plan:  1.Patellofemoral arthritis. Continue Voltaren gel, and continue with exercise regimen.04/28/2019 2 Chronic Low Back Pain/ Lumbar Stenosis:Continue HEP as Tolerated and Continue to Monitor.Continue current medication regimen.04/28/2019. 3. Chronic Pain:Slow Weaning:Continue to Monitor No script given today.ContinueHydrocodone 5/325 mg one tablet three times a day as needed for pain #80.04/28/2019. We will continue the opioid monitoring program, this consists of regular clinic visits, examinations, urine drug screen, pill counts as well as use of [place] Controlled Substance reporting System. 4. Muscle Spasm: Continuecurrent medication regimen withFlexeril. 04/28/2019 5. Primary Osteoarthritis of Right Hip: Continue to Alternate with Ice and Heat Therapy. Continue to Monitor. 04/28/2019 6. Idiopathic Peripheral Neuropathy:No Complaints today.Refuses Gabapentin or Amitriptylline at this time. Continue to Monitor.04/28/2019  F/U in 3 months  Tele-Health Visit Established Patient Location of Patient: In His Home Location of Provider: Office

## 2019-07-22 NOTE — Progress Notes (Signed)
Cardiology Clinic Note   Patient Name: Kristopher [name] Date of Encounter: 07/24/2019  Primary Care Provider:  [name], MD Primary Cardiologist:  [name], MD  Patient Profile    Kristopher [name] 86 year old male presents today for follow-up evaluation of his essential hypertension, coronary artery disease (status post LAD PCI 1998, RCA DES 2011), aortic valve disorder, AAA, and peripheral vascular disease.  Past Medical History    Past Medical History:  Diagnosis Date  . AAA (abdominal aortic aneurysm) ([place])    ultrasound 07/2011:  2.7 x 2.8 cm  . Acute myocardial infarction, unspecified site, episode of care unspecified 1998  . Aortic stenosis    echo 4/12: normal LVF, mild LAE, no significant AS  . Arthritis    "all over" (05/21/2017)  . CAD (coronary artery disease)    s/p PCI of LAD 1998;  s/p DES to RCA 05/2009;  Myoview 4/13: low risk, no ischemia, EF 56%  . CAP (community acquired pneumonia) 05/21/2017  . COPD (chronic obstructive pulmonary disease) ([place])   . DJD (degenerative joint disease), lumbar 07/06/2011  . Esophageal reflux   . HLD (hyperlipidemia)   . HTN (hypertension)   . PAD (peripheral artery disease) ([place])    s/p L CIA stent 09/2011 ([place])  . Pneumonia    "I've always had pneumonia; since I was 63 months old" (05/21/2017)  . Renal artery stenosis ([facility])    s/p bilat RA stents  . Urine incontinence    Past Surgical History:  Procedure Laterality Date  . CATARACT EXTRACTION W/ INTRAOCULAR LENS  IMPLANT, BILATERAL Bilateral YRS AGO  . CORONARY ANGIOPLASTY WITH STENT PLACEMENT  05/1996   PCI of LAD; "I've got a total of 4 stents" (05/21/2017)  . [place]  . LOWER EXTREMITY ANGIOGRAM Bilateral 09/20/2011   Procedure: LOWER EXTREMITY ANGIOGRAM;  Surgeon: Wellington Hampshire, MD;  Location: [facility];  Service: Cardiovascular;  Laterality: Bilateral;  . TRANSURETHRAL RESECTION OF PROSTATE  08/11/2011   Procedure: TRANSURETHRAL RESECTION OF  THE PROSTATE WITH GYRUS INSTRUMENTS;  Surgeon: Fredricka Bonine, MD;  Location: [facility];  Service: Urology;  Laterality: N/A;     . VASECTOMY  1968    Allergies  Allergies  Allergen Reactions  . Flu Virus Vaccine Other (See Comments)    Several reaction.  Took it once woke up two days later in the hospital.   . Influenza Vaccines Other (See Comments)    unknown  . Sulfa Antibiotics Rash    History of Present Illness    Mr. Luebbe has a past medical history of coronary artery disease s/p PCI to his LAD in February 1998.  He also has peripheral vascular disease and had prior bilateral renal artery stenting.  He underwent cardiac catheterization February 2011 which showed an EF of 60% no renal artery stenosis, his RCA at that time was 90% occluded.  He had a PCI to his RCA with drug-eluting stent.  Echocardiogram 4/12 showed normal LVEF, mild LAD and no significant left ear.  His lower extremity angiogram 6/13 showed small infrarenal aortic aneurysm, no renal artery stenosis, ostial right CIA 60%, proximal left CIA calcified with 80-90%, left EIA occluded.  He then underwent a and angioplasty and stenting to his left CIA.  He has a history of asymptomatic Mobitz type I.  A nuclear stress test 3/18 showed an ejection fraction of 56%, apical thinning and no ischemia.  His abdominal ultrasound 4/19 showed a 2.6 cm abdominal  aortic aneurysm.  He was last seen by Dr. Stanford Breed on 06/25/2018.  During that time he had some dyspnea on exertion but denied orthopnea, PND, lower extremity edema, and exertional chest pain.  He also denied syncope.  He presents the clinic today for follow-up evaluation and states he has been noticing increasing shortness of breath with yard work and walking to his mailbox.  He does not notice any increased shortness of breath with less intense activities.  He also exercises with his exercise bike 3-5 times a week for short periods of time.  He has not had any increased back  pain or abdominal pain.  He sees a pain clinic for right knee pain.  I will order an echocardiogram, schedule his abdominal ultrasound, and have him follow-up with Dr. Stanford Breed 6 months.  Today he denies chest pain, shortness of breath, lower extremity edema, fatigue, palpitations, melena, hematuria, hemoptysis, diaphoresis, weakness, presyncope, syncope, orthopnea, and PND.   Home Medications    Prior to Admission medications   Medication Sig Start Date End Date Taking? Authorizing Provider  Ascorbic Acid (VITAMIN C) 500 MG tablet Take 500 mg by mouth 2 (two) times daily.     [provider]  aspirin 81 MG tablet Take 1 tablet (81 mg total) by mouth daily. 06/25/18   Lelon Perla, MD  BEE POLLEN PO Take by mouth.    [provider]  Co-Enzyme Q-10 100 MG CAPS Take 1 capsule by mouth every evening.     [provider]  cyclobenzaprine (FLEXERIL) 5 MG tablet TAKE 1 TABLET(5 MG) BY MOUTH AT BEDTIME AS NEEDED FOR MUSCLE SPASMS 07/29/18   Danella Sensing L, NP  diclofenac sodium (VOLTAREN) 1 % GEL APPLY 2 GRAMS TOPICALLY FOUR TIMES A DAY AS NEEDED. 02/13/18   Bayard Hugger, NP  Docusate Calcium (STOOL SOFTENER PO) Take 2 capsules by mouth daily.    [provider]  HYDROcodone-acetaminophen (NORCO/VICODIN) 5-325 MG tablet Take 1 tablet by mouth 3 (three) times daily before meals. Do Not Fill Before 12/03/2018 11/19/18   Bayard Hugger, NP  Multiple Vitamin (MULTIVITAMIN) capsule Take 1 capsule by mouth daily.     [provider]  Multiple Vitamins-Minerals (PRESERVISION AREDS PO) Take 2 tablets by mouth daily.    [provider]  nitroGLYCERIN (NITROSTAT) 0.4 MG SL tablet Place 1 tablet (0.4 mg total) under the tongue every 5 (five) minutes as needed for chest pain. 07/14/14   Lelon Perla, MD  Omega-3 Fatty Acids (OMEGA-3 PLUS PO) Take by mouth.    [provider]  omeprazole (PRILOSEC) 20 MG capsule TAKE 2 CAPSULES DAILY  03/24/19   [name], MD  OVER THE COUNTER MEDICATION Take 2 capsules by mouth every morning. NITRIC OXIDE    [provider]  ramipril (ALTACE) 10 MG capsule Take 1 capsule (10 mg total) by mouth daily. 10/02/18   Lelon Perla, MD  rosuvastatin (CRESTOR) 40 MG tablet TAKE 1 TABLET DAILY 03/21/19   Lelon Perla, MD  triamcinolone cream (KENALOG) 0.1 % Apply 1 application topically 2 (two) times daily. 09/20/18 09/20/19  [name], MD    Family History    Family History  Problem Relation Age of Onset  . Heart disease Mother   . Heart disease Father    He indicated that his mother is deceased. He indicated that his father is deceased. He indicated that his maternal grandmother is deceased. He indicated that his maternal grandfather is  deceased. He indicated that his paternal grandmother is deceased. He indicated that his paternal grandfather is deceased.  Social History    Social History   Socioeconomic History  . Marital status: Widowed    Spouse name: Not on file  . Number of children: Not on file  . Years of education: GED  . Highest education level: Not on file  Occupational History  . Occupation: retired  Tobacco Use  . Smoking status: Former Smoker    Packs/day: 3.00    Years: 38.00    Pack years: 114.00    Quit date: 04/10/1988    Years since quitting: 31.3  . Smokeless tobacco: Never Used  Substance and Sexual Activity  . Alcohol use: Yes    Comment: 05/21/2017 "might drink 6 beers/year"  . Drug use: No  . Sexual activity: Not on file  Other Topics Concern  . Not on file  Social History Narrative  . Not on file   Social Determinants of Health   Financial Resource Strain:   . Difficulty of Paying Living Expenses:   Food Insecurity:   . Worried About Charity fundraiser in the Last Year:   . Arboriculturist in the Last Year:   Transportation Needs:   . Film/video editor (Medical):   Marland Kitchen Lack of Transportation (Non-Medical):    Physical Activity:   . Days of Exercise per Week:   . Minutes of Exercise per Session:   Stress:   . Feeling of Stress :   Social Connections:   . Frequency of Communication with Friends and Family:   . Frequency of Social Gatherings with Friends and Family:   . Attends Religious Services:   . Active Member of Clubs or Organizations:   . Attends Archivist Meetings:   Marland Kitchen Marital Status:   Intimate Partner Violence:   . Fear of Current or Ex-Partner:   . Emotionally Abused:   Marland Kitchen Physically Abused:   . Sexually Abused:      Review of Systems    General:  No chills, fever, night sweats or weight changes.  Cardiovascular:  No chest pain, dyspnea on exertion, edema, orthopnea, palpitations, paroxysmal nocturnal dyspnea. Dermatological: No rash, lesions/masses Respiratory: No cough, dyspnea Urologic: No hematuria, dysuria Abdominal:   No nausea, vomiting, diarrhea, bright red blood per rectum, melena, or hematemesis Neurologic:  No visual changes, wkns, changes in mental status. All other systems reviewed and are otherwise negative except as noted above.  Physical Exam    VS:  BP (!) 110/56 (BP Location: Left Arm, Patient Position: Sitting, Cuff Size: Normal)   Pulse 74   Temp (!) 96.3 F (35.7 C)   Ht 5\' 8"  (1.727 m)   Wt 141 lb (64 kg)   BMI 21.44 kg/m  , BMI Body mass index is 21.44 kg/m. GEN: Well nourished, well developed, in no acute distress. HEENT: normal. Neck: Supple, no JVD, carotid bruits, or masses. Cardiac: RRR, no murmurs, rubs, or gallops. No clubbing, cyanosis, edema.  Radials/DP/PT 2+ and equal bilaterally.  Respiratory:  Respirations regular and unlabored, clear to auscultation bilaterally. GI: Soft, nontender, nondistended, BS + x 4. MS: no deformity or atrophy. Skin: warm and dry, no rash. Neuro:  Strength and sensation are intact. Psych: Normal affect.  Accessory Clinical Findings    ECG personally reviewed by me today-normal sinus  rhythm right bundle branch block 74 bpm- No acute changes  EKG 06/25/2018 Normal sinus rhythm right bundle branch block 68 bpm  Echocardiogram 07/28/2010 Study Conclusions   - Left ventricle: The cavity size was normal. Wall thickness was  normal. Systolic function was normal. The estimated ejection  fraction was in the range of 55% to 60%. Wall motion was normal;  there were no regional wall motion abnormalities. Doppler  parameters are consistent with abnormal left ventricular  relaxation (grade 1 diastolic dysfunction).  - Left atrium: The atrium was mildly dilated.  Nuclear stress test 06/27/2016  The left ventricular ejection fraction is normal (55-65%).  Nuclear stress EF: 56%.  Defect 1: There is a small defect of moderate severity present in the apex location. This defect is most consistent with apical thinning .  The study is normal.  This is a low risk study.  Assessment & Plan   1.  Coronary artery disease/DOE-no chest pain today.  Nuclear stress test 06/27/2016 showed an EF of 56%, small defect consistent with apical thinning, normal study low risk.Has noticed increased DOE with mowing and physical activity.  Continue aspirin Continue statin Continue nitroglycerin 0.4 mg sublingual as needed Continue ramipril 10 mg daily Order echocardiogram  Hyperlipidemia-09/20/2018: Cholesterol 143; HDL 50.40; LDL Cholesterol 74; Triglycerides 94.0; VLDL 18.8 Continue rosuvastatin Heart healthy low-sodium high-fiber diet Increase physical activity as tolerated Monitored by PCP  Essential hypertension-BP today 110/56 Continue ramipril 10 mg daily Heart healthy low-sodium diet Increase physical activity as tolerated  Abdominal aortic aneurysm-on last evaluation 2.7 cm 7/20. Repeat abdominal ultrasound 7/21  Peripheral vascular disease-no complaints of lower extremity claudication. Continue to monitor.  Disposition: Follow-up with Dr. Stanford Breed in 6  months  Jossie Ng. [facility] [place] [address] Office [phone] Fax [phone]

## 2019-07-24 ENCOUNTER — Other Ambulatory Visit: Payer: Self-pay

## 2019-07-24 ENCOUNTER — Ambulatory Visit (INDEPENDENT_AMBULATORY_CARE_PROVIDER_SITE_OTHER): Payer: Medicare PPO | Admitting: General Practice

## 2019-07-24 ENCOUNTER — Encounter: Payer: Self-pay | Admitting: General Practice

## 2019-07-24 VITALS — BP 110/56 | HR 74 | Temp 96.3°F | Ht 68.0 in | Wt 141.0 lb

## 2019-07-24 DIAGNOSIS — I2510 Atherosclerotic heart disease of native coronary artery without angina pectoris: Secondary | ICD-10-CM

## 2019-07-24 DIAGNOSIS — R0600 Dyspnea, unspecified: Secondary | ICD-10-CM

## 2019-07-24 DIAGNOSIS — E7800 Pure hypercholesterolemia, unspecified: Secondary | ICD-10-CM | POA: Diagnosis not present

## 2019-07-24 DIAGNOSIS — I739 Peripheral vascular disease, unspecified: Secondary | ICD-10-CM

## 2019-07-24 DIAGNOSIS — I714 Abdominal aortic aneurysm, without rupture, unspecified: Secondary | ICD-10-CM

## 2019-07-24 DIAGNOSIS — I77819 Aortic ectasia, unspecified site: Secondary | ICD-10-CM

## 2019-07-24 DIAGNOSIS — I10 Essential (primary) hypertension: Secondary | ICD-10-CM | POA: Diagnosis not present

## 2019-07-24 DIAGNOSIS — R0609 Other forms of dyspnea: Secondary | ICD-10-CM

## 2019-07-24 NOTE — Patient Instructions (Signed)
Medication Instructions:  The current medical regimen is effective;  continue present plan and medications as directed. Please refer to the Current Medication list given to you today. *If you need a refill on your cardiac medications before your next appointment, please call your pharmacy*  Testing/Procedures: Echocardiogram - Your physician has requested that you have an echocardiogram. Echocardiography is a painless test that uses sound waves to create images of your heart. It provides your doctor with information about the size and shape of your heart and how well your heart's chambers and valves are working. This procedure takes approximately one hour. There are no restrictions for this procedure. This will be performed at our [facility] location - [address], [address].  Your physician has requested that you have an abdominal aorta duplex-IN JULY. During this test, an ultrasound is used to evaluate the aorta. Allow 30 minutes for this exam. Do not eat after midnight the day before and avoid carbonated beverages   Special Instructions PLEASE READ AND FOLLOW SALTY 6-ATTACHED  Follow-Up: Your next appointment:  6 month(s) Please call our office 2 months in advance to schedule this appointment In Person with [name], MD  At [facility], you and your health needs are our priority.  As part of our continuing mission to provide you with exceptional heart care, we have created designated Provider Care Teams.  These Care Teams include your primary Cardiologist (physician) and Advanced Practice Providers (APPs -  Physician Assistants and Nurse Practitioners) who all work together to provide you with the care you need, when you need it.  We recommend signing up for the patient portal called "MyChart".  Sign up information is provided on this After Visit Summary.  MyChart is used to connect with patients for Virtual Visits (Telemedicine).  Patients are able to view lab/test results,  encounter notes, upcoming appointments, etc.  Non-urgent messages can be sent to your provider as well.   To learn more about what you can do with MyChart, go to [email or web address].

## 2019-08-06 ENCOUNTER — Ambulatory Visit (HOSPITAL_COMMUNITY): Payer: Medicare PPO | Attending: Cardiovascular Disease

## 2019-08-06 ENCOUNTER — Other Ambulatory Visit: Payer: Self-pay

## 2019-08-06 DIAGNOSIS — R0609 Other forms of dyspnea: Secondary | ICD-10-CM

## 2019-08-06 DIAGNOSIS — I2510 Atherosclerotic heart disease of native coronary artery without angina pectoris: Secondary | ICD-10-CM | POA: Insufficient documentation

## 2019-08-06 DIAGNOSIS — R0600 Dyspnea, unspecified: Secondary | ICD-10-CM | POA: Insufficient documentation

## 2019-08-26 ENCOUNTER — Encounter: Payer: Self-pay | Admitting: Registered Nurse

## 2019-08-26 ENCOUNTER — Other Ambulatory Visit: Payer: Self-pay

## 2019-08-26 ENCOUNTER — Encounter: Payer: Medicare PPO | Attending: Registered Nurse | Admitting: Registered Nurse

## 2019-08-26 VITALS — BP 116/81 | HR 80 | Temp 97.2°F | Ht 68.0 in | Wt 140.0 lb

## 2019-08-26 DIAGNOSIS — G8929 Other chronic pain: Secondary | ICD-10-CM

## 2019-08-26 DIAGNOSIS — Z5181 Encounter for therapeutic drug level monitoring: Secondary | ICD-10-CM | POA: Insufficient documentation

## 2019-08-26 DIAGNOSIS — M62838 Other muscle spasm: Secondary | ICD-10-CM | POA: Insufficient documentation

## 2019-08-26 DIAGNOSIS — M1611 Unilateral primary osteoarthritis, right hip: Secondary | ICD-10-CM | POA: Diagnosis present

## 2019-08-26 DIAGNOSIS — G894 Chronic pain syndrome: Secondary | ICD-10-CM | POA: Diagnosis present

## 2019-08-26 DIAGNOSIS — Z79891 Long term (current) use of opiate analgesic: Secondary | ICD-10-CM | POA: Diagnosis present

## 2019-08-26 DIAGNOSIS — M48062 Spinal stenosis, lumbar region with neurogenic claudication: Secondary | ICD-10-CM | POA: Diagnosis present

## 2019-08-26 DIAGNOSIS — M25561 Pain in right knee: Secondary | ICD-10-CM | POA: Insufficient documentation

## 2019-08-26 DIAGNOSIS — M25571 Pain in right ankle and joints of right foot: Secondary | ICD-10-CM | POA: Diagnosis present

## 2019-08-26 NOTE — Progress Notes (Signed)
Subjective:    Patient ID: Kristopher [name], male    DOB: [birth year], 86 y.o.   MRN: [id number]  HPI: Kristopher [name] is a 86 y.o. male who returns for follow up appointment for chronic pain and medication refill. He states his pain is located in his lower back pain occasionally, right hip pain, right knee pain and right ankle pain. He rates his pain 2. His current exercise regime is walking and performing stretching exercises.  Kristopher [name] Morphine equivalent is 15.38   MME.  Last Fill date of Hydrocodone was 12/05/2018   Pain Inventory Average Pain 2 Pain Right Now 2 My pain is dull  In the last 24 hours, has pain interfered with the following? General activity 2 Relation with others 2 Enjoyment of life 2 What TIME of day is your pain at its worst? evening Sleep (in general) Fair  Pain is worse with: some activites Pain improves with: medication Relief from Meds: 8  Mobility walk with assistance use a cane ability to climb steps?  yes do you drive?  yes Do you have any goals in this area?  no  Function retired  Neuro/Psych No problems in this area  Prior Studies Any changes since last visit?  no  Physicians involved in your care Any changes since last visit?  no   Family History  Problem Relation Age of Onset  . Heart disease Mother   . Heart disease Father    Social History   Socioeconomic History  . Marital status: Widowed    Spouse name: Not on file  . Number of children: Not on file  . Years of education: GED  . Highest education level: Not on file  Occupational History  . Occupation: retired  Tobacco Use  . Smoking status: Former Smoker    Packs/day: 3.00    Years: 38.00    Pack years: 114.00    Quit date: 04/10/1988    Years since quitting: 31.3  . Smokeless tobacco: Never Used  Substance and Sexual Activity  . Alcohol use: Yes    Comment: 05/21/2017 "might drink 6 beers/year"  . Drug use: No  . Sexual activity: Not on file  Other Topics  Concern  . Not on file  Social History Narrative  . Not on file   Social Determinants of Health   Financial Resource Strain:   . Difficulty of Paying Living Expenses:   Food Insecurity:   . Worried About Charity fundraiser in the Last Year:   . Arboriculturist in the Last Year:   Transportation Needs:   . Film/video editor (Medical):   Marland Kitchen Lack of Transportation (Non-Medical):   Physical Activity:   . Days of Exercise per Week:   . Minutes of Exercise per Session:   Stress:   . Feeling of Stress :   Social Connections:   . Frequency of Communication with Friends and Family:   . Frequency of Social Gatherings with Friends and Family:   . Attends Religious Services:   . Active Member of Clubs or Organizations:   . Attends Archivist Meetings:   Marland Kitchen Marital Status:    Past Surgical History:  Procedure Laterality Date  . CATARACT EXTRACTION W/ INTRAOCULAR LENS  IMPLANT, BILATERAL Bilateral YRS AGO  . CORONARY ANGIOPLASTY WITH STENT PLACEMENT  05/1996   PCI of LAD; "I've got a total of 4 stents" (05/21/2017)  . [place]  . LOWER EXTREMITY ANGIOGRAM Bilateral  09/20/2011   Procedure: LOWER EXTREMITY ANGIOGRAM;  Surgeon: Wellington Hampshire, MD;  Location: [facility];  Service: Cardiovascular;  Laterality: Bilateral;  . TRANSURETHRAL RESECTION OF PROSTATE  08/11/2011   Procedure: TRANSURETHRAL RESECTION OF THE PROSTATE WITH GYRUS INSTRUMENTS;  Surgeon: Fredricka Bonine, MD;  Location: [facility];  Service: Urology;  Laterality: N/A;     . VASECTOMY  1968   Past Medical History:  Diagnosis Date  . AAA (abdominal aortic aneurysm) ([place])    ultrasound 07/2011:  2.7 x 2.8 cm  . Acute myocardial infarction, unspecified site, episode of care unspecified 1998  . Aortic stenosis    echo 4/12: normal LVF, mild LAE, no significant AS  . Arthritis    "all over" (05/21/2017)  . CAD (coronary artery disease)    s/p PCI of LAD 1998;  s/p DES to RCA 05/2009;  Myoview  4/13: low risk, no ischemia, EF 56%  . CAP (community acquired pneumonia) 05/21/2017  . COPD (chronic obstructive pulmonary disease) ([place])   . DJD (degenerative joint disease), lumbar 07/06/2011  . Esophageal reflux   . HLD (hyperlipidemia)   . HTN (hypertension)   . PAD (peripheral artery disease) ([place])    s/p L CIA stent 09/2011 ([place])  . Pneumonia    "I've always had pneumonia; since I was 63 months old" (05/21/2017)  . Renal artery stenosis (H[facility])    s/p bilat RA stents  . Urine incontinence    Temp (!) 97.2 F (36.2 C)   Ht 5\' 8"  (1.727 m)   Wt 140 lb (63.5 kg)   BMI 21.29 kg/m   Opioid Risk Score:   Fall Risk Score:  `1  Depression screen PHQ 2/9  Depression screen [facility] 2/9 09/20/2018 09/12/2018 04/11/2018 02/12/2018 01/09/2018 11/30/2017 08/06/2017  Decreased Interest 0 0 0 0 0 0 0  Down, Depressed, Hopeless 0 0 0 0 0 0 0  PHQ - 2 Score 0 0 0 0 0 0 0  Altered sleeping - - - - - - -  Tired, decreased energy - - - - - - -  Change in appetite - - - - - - -  Feeling bad or failure about yourself  - - - - - - -  Trouble concentrating - - - - - - -  Moving slowly or fidgety/restless - - - - - - -  Suicidal thoughts - - - - - - -  PHQ-9 Score - - - - - - -  Some recent data might be hidden    Review of Systems  Constitutional: Negative.   HENT: Negative.   Eyes: Negative.   Respiratory: Negative.   Cardiovascular: Negative.   Gastrointestinal: Negative.   Endocrine: Negative.   Genitourinary: Negative.   Musculoskeletal: Positive for arthralgias.  Skin: Negative.   Allergic/Immunologic: Negative.   Neurological: Negative.   Hematological: Negative.   Psychiatric/Behavioral: Negative.   All other systems reviewed and are negative.      Objective:   Physical Exam Vitals and nursing note reviewed.  Constitutional:      Appearance: Normal appearance.  Cardiovascular:     Rate and Rhythm: Normal rate and regular rhythm.     Pulses: Normal pulses.     Heart sounds:  Normal heart sounds.  Pulmonary:     Effort: Pulmonary effort is normal.     Breath sounds: Normal breath sounds.  Musculoskeletal:     Cervical back: Normal range of motion and neck supple.  Comments: Normal Muscle Bulk and Muscle Testing Reveals:  Upper Extremities: Full ROM and Muscle Strength 5/5 Lower Extremities: Full ROM and Muscle Strength 5/5 Right Lower Extremity Flexion Produces Pain into his Right Patella Arises from chair slowly using cane for support Narrow Based Gait   Skin:    General: Skin is warm and dry.  Neurological:     Mental Status: He is alert and oriented to person, place, and time.  Psychiatric:        Mood and Affect: Mood normal.        Behavior: Behavior normal.           Assessment & Plan:  1.Patellofemoral arthritis. Continue Voltaren gel, and continue with exercise regimen.08/26/2019 2 Chronic Low Back Pain/ Lumbar Stenosis:Continue HEP as Tolerated and Continue to Monitor.Continue current medication regimen.08/26/2019. 3. Chronic Pain:Slow Weaning:Continueto Monitor No script given today.ContinueHydrocodone 5/325 mg one tablet three times a dayas needed for pain#80.08/26/2019. We will continue the opioid monitoring program, this consists of regular clinic visits, examinations, urine drug screen, pill counts as well as use of [place] Controlled Substance reporting System. 4. Muscle Spasm: Continuecurrent medication regimen withFlexeril.08/26/2019 5. Primary Osteoarthritis of Right Hip: Continue to Alternate with Ice and Heat Therapy. Continue to Monitor.08/26/2019 6. Idiopathic Peripheral Neuropathy:No Complaints today.Refuses Gabapentin or Amitriptylline at this time. Continue to Monitor.08/26/2019  F/U in11months

## 2019-09-13 ENCOUNTER — Other Ambulatory Visit: Payer: Self-pay | Admitting: Cardiology

## 2019-09-15 NOTE — Telephone Encounter (Signed)
Rx request sent to pharmacy.  

## 2019-10-21 ENCOUNTER — Encounter: Payer: Self-pay | Admitting: *Deleted

## 2019-10-21 ENCOUNTER — Other Ambulatory Visit: Payer: Self-pay | Admitting: *Deleted

## 2019-10-21 ENCOUNTER — Ambulatory Visit (HOSPITAL_COMMUNITY)
Admission: RE | Admit: 2019-10-21 | Discharge: 2019-10-21 | Disposition: A | Discharge status: Home / Self Care | Payer: Medicare PPO | Source: Ambulatory Visit | Attending: Cardiology | Admitting: Cardiology

## 2019-10-21 ENCOUNTER — Other Ambulatory Visit: Payer: Self-pay

## 2019-10-21 DIAGNOSIS — I77819 Aortic ectasia, unspecified site: Secondary | ICD-10-CM | POA: Insufficient documentation

## 2019-10-21 DIAGNOSIS — I714 Abdominal aortic aneurysm, without rupture, unspecified: Secondary | ICD-10-CM

## 2019-10-21 DIAGNOSIS — I739 Peripheral vascular disease, unspecified: Secondary | ICD-10-CM | POA: Diagnosis present

## 2019-10-21 NOTE — Progress Notes (Signed)
vas 

## 2019-11-06 ENCOUNTER — Emergency Department (HOSPITAL_COMMUNITY): Payer: Medicare PPO

## 2019-11-06 ENCOUNTER — Other Ambulatory Visit: Payer: Self-pay

## 2019-11-06 ENCOUNTER — Encounter (HOSPITAL_COMMUNITY): Payer: Self-pay

## 2019-11-06 ENCOUNTER — Emergency Department (HOSPITAL_COMMUNITY)
Admission: EM | Admit: 2019-11-06 | Discharge: 2019-11-06 | Disposition: A | Discharge status: Home / Self Care | Payer: Medicare PPO | Attending: Emergency Medicine | Admitting: Emergency Medicine

## 2019-11-06 DIAGNOSIS — R55 Syncope and collapse: Secondary | ICD-10-CM | POA: Diagnosis not present

## 2019-11-06 DIAGNOSIS — R079 Chest pain, unspecified: Secondary | ICD-10-CM | POA: Insufficient documentation

## 2019-11-06 DIAGNOSIS — I10 Essential (primary) hypertension: Secondary | ICD-10-CM | POA: Insufficient documentation

## 2019-11-06 DIAGNOSIS — Z79899 Other long term (current) drug therapy: Secondary | ICD-10-CM | POA: Diagnosis not present

## 2019-11-06 DIAGNOSIS — R109 Unspecified abdominal pain: Secondary | ICD-10-CM | POA: Diagnosis not present

## 2019-11-06 DIAGNOSIS — Z87891 Personal history of nicotine dependence: Secondary | ICD-10-CM | POA: Insufficient documentation

## 2019-11-06 DIAGNOSIS — J449 Chronic obstructive pulmonary disease, unspecified: Secondary | ICD-10-CM | POA: Diagnosis not present

## 2019-11-06 DIAGNOSIS — I2510 Atherosclerotic heart disease of native coronary artery without angina pectoris: Secondary | ICD-10-CM | POA: Diagnosis not present

## 2019-11-06 DIAGNOSIS — R531 Weakness: Secondary | ICD-10-CM | POA: Insufficient documentation

## 2019-11-06 DIAGNOSIS — Z955 Presence of coronary angioplasty implant and graft: Secondary | ICD-10-CM | POA: Insufficient documentation

## 2019-11-06 DIAGNOSIS — I959 Hypotension, unspecified: Secondary | ICD-10-CM | POA: Diagnosis present

## 2019-11-06 LAB — I-STAT CHEM 8, ED
BUN: 18 mg/dL (ref 8–23)
Calcium, Ion: 0.71 mmol/L — CL (ref 1.15–1.40)
Chloride: 122 mmol/L — ABNORMAL HIGH (ref 98–111)
Creatinine, Ser: 0.6 mg/dL — ABNORMAL LOW (ref 0.61–1.24)
Glucose, Bld: 59 mg/dL — ABNORMAL LOW (ref 70–99)
HCT: 18 % — ABNORMAL LOW (ref 39.0–52.0)
Hemoglobin: 6.1 g/dL — CL (ref 13.0–17.0)
Potassium: 2.8 mmol/L — ABNORMAL LOW (ref 3.5–5.1)
Sodium: 150 mmol/L — ABNORMAL HIGH (ref 135–145)
TCO2: 12 mmol/L — ABNORMAL LOW (ref 22–32)

## 2019-11-06 LAB — COMPREHENSIVE METABOLIC PANEL
ALT: 20 U/L (ref 0–44)
AST: 62 U/L — ABNORMAL HIGH (ref 15–41)
Albumin: 3.3 g/dL — ABNORMAL LOW (ref 3.5–5.0)
Alkaline Phosphatase: 77 U/L (ref 38–126)
Anion gap: 11 (ref 5–15)
BUN: 29 mg/dL — ABNORMAL HIGH (ref 8–23)
CO2: 19 mmol/L — ABNORMAL LOW (ref 22–32)
Calcium: 8.4 mg/dL — ABNORMAL LOW (ref 8.9–10.3)
Chloride: 111 mmol/L (ref 98–111)
Creatinine, Ser: 1.49 mg/dL — ABNORMAL HIGH (ref 0.61–1.24)
GFR calc Af Amer: 48 mL/min — ABNORMAL LOW (ref >60)
GFR calc non Af Amer: 42 mL/min — ABNORMAL LOW (ref >60)
Glucose, Bld: 128 mg/dL — ABNORMAL HIGH (ref 70–99)
Potassium: 5.7 mmol/L — ABNORMAL HIGH (ref 3.5–5.1)
Sodium: 141 mmol/L (ref 135–145)
Total Bilirubin: 1.1 mg/dL (ref 0.3–1.2)
Total Protein: 5.7 g/dL — ABNORMAL LOW (ref 6.5–8.1)

## 2019-11-06 LAB — CBC WITH DIFFERENTIAL/PLATELET
Abs Immature Granulocytes: 0.07 10*3/uL (ref 0.00–0.07)
Basophils Absolute: 0.1 10*3/uL (ref 0.0–0.1)
Basophils Relative: 0 %
Eosinophils Absolute: 0.1 10*3/uL (ref 0.0–0.5)
Eosinophils Relative: 1 %
HCT: 39.8 % (ref 39.0–52.0)
Hemoglobin: 12.2 g/dL — ABNORMAL LOW (ref 13.0–17.0)
Immature Granulocytes: 1 %
Lymphocytes Relative: 9 %
Lymphs Abs: 1.3 10*3/uL (ref 0.7–4.0)
MCH: 29.8 pg (ref 26.0–34.0)
MCHC: 30.7 g/dL (ref 30.0–36.0)
MCV: 97.3 fL (ref 80.0–100.0)
Monocytes Absolute: 0.9 10*3/uL (ref 0.1–1.0)
Monocytes Relative: 6 %
Neutro Abs: 11.9 10*3/uL — ABNORMAL HIGH (ref 1.7–7.7)
Neutrophils Relative %: 83 %
Platelets: 192 10*3/uL (ref 150–400)
RBC: 4.09 MIL/uL — ABNORMAL LOW (ref 4.22–5.81)
RDW: 14.8 % (ref 11.5–15.5)
WBC: 14.3 10*3/uL — ABNORMAL HIGH (ref 4.0–10.5)
nRBC: 0 % (ref 0.0–0.2)

## 2019-11-06 LAB — LIPASE, BLOOD: Lipase: 41 U/L (ref 11–51)

## 2019-11-06 LAB — TROPONIN I (HIGH SENSITIVITY)
Troponin I (High Sensitivity): 6 ng/L (ref <18)
Troponin I (High Sensitivity): 5 ng/L (ref <18)

## 2019-11-06 LAB — BASIC METABOLIC PANEL
Anion gap: 11 (ref 5–15)
BUN: 30 mg/dL — ABNORMAL HIGH (ref 8–23)
CO2: 18 mmol/L — ABNORMAL LOW (ref 22–32)
Calcium: 8.6 mg/dL — ABNORMAL LOW (ref 8.9–10.3)
Chloride: 112 mmol/L — ABNORMAL HIGH (ref 98–111)
Creatinine, Ser: 1.54 mg/dL — ABNORMAL HIGH (ref 0.61–1.24)
GFR calc Af Amer: 46 mL/min — ABNORMAL LOW (ref >60)
GFR calc non Af Amer: 40 mL/min — ABNORMAL LOW (ref >60)
Glucose, Bld: 119 mg/dL — ABNORMAL HIGH (ref 70–99)
Potassium: 4.7 mmol/L (ref 3.5–5.1)
Sodium: 141 mmol/L (ref 135–145)

## 2019-11-06 MED ORDER — SODIUM CHLORIDE 0.9 % IV BOLUS
500.0000 mL | Freq: Once | INTRAVENOUS | Status: AC
  Administered 2019-11-06: 500 mL via INTRAVENOUS

## 2019-11-06 NOTE — ED Triage Notes (Signed)
Pt come from home. Per EMS pt's family called EMS due to L sided CP while having a BM this morning. Pt became extremely diaphoretic and hypotensive. 20 G started in L wrist. 350 ml NS bolus given. Pt took 324 aspirin before EMS arrival. 0/10 chest pain currently. Reported vitals 124/100 BP, 95% O2 on RA,  80 HR.

## 2019-11-06 NOTE — ED Notes (Signed)
Pt states he has to use the restroom. Upon entering room pt is upset that he has been here for 3 hours and someone has to draw blood.  Pt informed that there is no more blood needing to be collected at this time and I apologized for the confusion. Pt then states he is about to take his IV out and leave if I did not remove it. Pt encouraged to stay and wait to see MD before leaving. MD informed.

## 2019-11-06 NOTE — ED Provider Notes (Signed)
  Physical Exam  BP (!) 125/58   Pulse 62   Temp (S) (!) 97.5 F (36.4 C) (Oral) Comment: Pt states he is normally 97 F  Resp 18   Ht 5\' 8"  (1.727 m)   SpO2 99%   BMI 21.29 kg/m   Physical Exam  ED Course/Procedures     Procedures  MDM  Patient is here with near syncope.  He was try to have a bowel movement and then had some abdominal pain and almost passed out his blood pressure was low initially.  He was given IV fluids and felt better.  He has a known AAA and recently had ultrasound done 2 weeks ago that shows that it was stable at 3 cm.  He has no abdominal tenderness or pulsatile mass on my exam.  And his blood pressure now is 120s after given IV fluids.  However, patient's initial potassium was elevated at 5.7 so signout was to repeat it and likely discharge patient home.  5:37 PM Repeat potassium is now 4.7.  Of note, patient does have an i-STAT Chem-8 but that was drawn from the line and there was sailing in it and was abnormal.  However his actual BMP is normal.  Stable for discharge.    Drenda Freeze, MD 11/06/19 [phone]

## 2019-11-06 NOTE — ED Provider Notes (Signed)
[facility] EMERGENCY DEPARTMENT Provider Note   CSN: [id number] Arrival date & time: 11/06/19  1209     History Chief Complaint  Patient presents with  . Hypotension    Kristopher [name] is a 86 y.o. male.  HPI Patient presents with an episode of abdominal pain chest pain and near syncope.  States he had been going to the bathroom having a bowel movement.  States developed severe abdominal pain and mild left lower chest pain.  Not like previous coronary disease.  States he became diaphoretic and lightheaded.  Took an aspirin at home.  Now feeling much better.  States he feels back to normal.  Fluid bolus given by EMS.  No swelling in his legs.  History of small aortic aneurysm but on the order of around 2.8 cm    Past Medical History:  Diagnosis Date  . AAA (abdominal aortic aneurysm) ([place])    ultrasound 07/2011:  2.7 x 2.8 cm  . Acute myocardial infarction, unspecified site, episode of care unspecified 1998  . Aortic stenosis    echo 4/12: normal LVF, mild LAE, no significant AS  . Arthritis    "all over" (05/21/2017)  . CAD (coronary artery disease)    s/p PCI of LAD 1998;  s/p DES to RCA 05/2009;  Myoview 4/13: low risk, no ischemia, EF 56%  . CAP (community acquired pneumonia) 05/21/2017  . COPD (chronic obstructive pulmonary disease) ([place])   . DJD (degenerative joint disease), lumbar 07/06/2011  . Esophageal reflux   . HLD (hyperlipidemia)   . HTN (hypertension)   . PAD (peripheral artery disease) ([place])    s/p L CIA stent 09/2011 ([place])  . Pneumonia    "I've always had pneumonia; since I was 7 months old" (05/21/2017)  . Renal artery stenosis ([facility])    s/p bilat RA stents  . Urine incontinence     Patient Active Problem List   Diagnosis Date Noted  . Insect bite 09/20/2018  . Hyperglycemia 11/30/2017  . Skin lesion 11/30/2017  . CAP (community acquired pneumonia) 05/21/2017  . Acute kidney injury ([place]) 05/21/2017  . Acute respiratory failure ([place])  05/21/2017  . Sepsis ([place]) 05/21/2017  . Finger swelling 12/18/2016  . Paronychia of left middle finger 12/10/2016  . Spinal stenosis, lumbar region, with neurogenic claudication 08/31/2014  . Right lumbar radiculitis 08/04/2014  . Chronic low back pain 07/02/2014  . RBBB 06/18/2014  . Patellofemoral arthralgia of right knee 03/24/2014  . Osteoarthritis of right hip 03/24/2014  . Dupuytren's contracture of both hands 03/24/2014  . Ankle impingement syndrome 11/06/2013  . Liver mass 10/21/2013  . Low back pain 08/31/2013  . AV block, 2nd degree 08/31/2013  . Chronic pain syndrome 04/17/2013  . Right hand pain 07/11/2012  . Peripheral artery disease ([place]) 10/04/2011  . COPD (chronic obstructive pulmonary disease) ([place]) 07/06/2011  . DJD (degenerative joint disease), lumbar 07/06/2011  . Preventative health care 07/06/2011  . Abdominal pain 05/25/2011  . Pancreatitis 05/24/2011  . Abdominal aortic aneurysm ([place]) 06/13/2010  . Hyperlipidemia 05/18/2009  . Essential hypertension 05/18/2009  . MI 05/18/2009  . CAD S/P LAD PCI 1998, RCA DES 2011 05/18/2009  . Aortic valve disorder 05/18/2009  . RENAL ARTERY STENOSIS 05/18/2009  . Peripheral vascular disease ([place]) 05/18/2009  . GERD 05/18/2009  . DYSPNEA 05/18/2009    Past Surgical History:  Procedure Laterality Date  . CATARACT EXTRACTION W/ INTRAOCULAR LENS  IMPLANT, BILATERAL Bilateral YRS AGO  . CORONARY ANGIOPLASTY WITH  STENT PLACEMENT  05/1996   PCI of LAD; "I've got a total of 4 stents" (05/21/2017)  . [place]  . LOWER EXTREMITY ANGIOGRAM Bilateral 09/20/2011   Procedure: LOWER EXTREMITY ANGIOGRAM;  Surgeon: Wellington Hampshire, MD;  Location: [facility] CATH [facility];  Service: Cardiovascular;  Laterality: Bilateral;  . TRANSURETHRAL RESECTION OF PROSTATE  08/11/2011   Procedure: TRANSURETHRAL RESECTION OF THE PROSTATE WITH GYRUS INSTRUMENTS;  Surgeon: Fredricka Bonine, MD;  Location: [facility] OR[facility];  Service: Urology;   Laterality: N/A;     . VASECTOMY  1968       Family History  Problem Relation Age of Onset  . Heart disease Mother   . Heart disease Father     Social History   Tobacco Use  . Smoking status: Former Smoker    Packs/day: 3.00    Years: 38.00    Pack years: 114.00    Quit date: 04/10/1988    Years since quitting: 31.5  . Smokeless tobacco: Never Used  Vaping Use  . Vaping Use: Never used  Substance Use Topics  . Alcohol use: Yes    Comment: 05/21/2017 "might drink 6 beers/year"  . Drug use: No    Home Medications Prior to Admission medications   Medication Sig Start Date End Date Taking? Authorizing Provider  Ascorbic Acid (VITAMIN C) 500 MG tablet Take 500 mg by mouth 2 (two) times daily.    Yes [provider]  aspirin 81 MG tablet Take 1 tablet (81 mg total) by mouth daily. 06/25/18  Yes Lelon Perla, MD  BEE POLLEN PO Take 1 capsule by mouth 2 (two) times daily.    Yes [provider]  Co-Enzyme Q-10 100 MG CAPS Take 100 mg by mouth every evening.    Yes [provider]  cyclobenzaprine (FLEXERIL) 5 MG tablet TAKE 1 TABLET(5 MG) BY MOUTH AT BEDTIME AS NEEDED FOR MUSCLE SPASMS Patient taking differently: Take 5 mg by mouth at bedtime as needed for muscle spasms.  07/29/18  Yes Danella Sensing L, NP  diclofenac sodium (VOLTAREN) 1 % GEL APPLY 2 GRAMS TOPICALLY FOUR TIMES A DAY AS NEEDED. Patient taking differently: Apply 2 g topically 4 (four) times daily as needed (joint pain).  02/13/18  Yes Bayard Hugger, NP  Docusate Calcium (STOOL SOFTENER PO) Take 2 capsules by mouth daily.   Yes [provider]  HYDROcodone-acetaminophen (NORCO/VICODIN) 5-325 MG tablet Take 1 tablet by mouth 3 (three) times daily before meals. Do Not Fill Before 12/03/2018 11/19/18  Yes Bayard Hugger, NP  Multiple Vitamin (MULTIVITAMIN) capsule Take 1 capsule by mouth daily.    Yes [provider]  Multiple Vitamins-Minerals (PRESERVISION AREDS PO)  Take 2 tablets by mouth daily.   Yes [provider]  nitroGLYCERIN (NITROSTAT) 0.4 MG SL tablet Place 1 tablet (0.4 mg total) under the tongue every 5 (five) minutes as needed for chest pain. 07/14/14  Yes Lelon Perla, MD  Omega-3 Fatty Acids (OMEGA-3 PLUS PO) Take 1 tablet by mouth daily.    Yes [provider]  omeprazole (PRILOSEC) 20 MG capsule TAKE 2 CAPSULES DAILY Patient taking differently: Take 20 mg by mouth 2 (two) times daily before a meal.  03/24/19  Yes [name], MD  OVER THE COUNTER MEDICATION Take 2 capsules by mouth every morning. NITRIC OXIDE   Yes [provider]  ramipril (ALTACE) 10 MG capsule TAKE 1 CAPSULE DAILY Patient taking differently: Take 10 mg by mouth daily.  09/15/19  Yes Lelon Perla, MD  rosuvastatin (CRESTOR) 40 MG tablet TAKE 1 TABLET DAILY Patient taking differently: Take 40 mg by mouth every evening.  03/21/19  Yes Lelon Perla, MD    Allergies    Flu virus vaccine, Influenza vaccines, and Sulfa antibiotics  Review of Systems   Review of Systems  Constitutional: Positive for diaphoresis. Negative for appetite change.  Respiratory: Negative for shortness of breath.   Cardiovascular: Positive for chest pain.  Gastrointestinal: Positive for abdominal pain. Negative for nausea and vomiting.  Genitourinary: Negative for flank pain.  Musculoskeletal: Negative for back pain.  Skin: Negative for rash.  Neurological: Positive for weakness.  Psychiatric/Behavioral: Negative for confusion.    Physical Exam Updated Vital Signs BP (!) 125/58   Pulse 62   Temp (S) (!) 97.5 F (36.4 C) (Oral) Comment: Pt states he is normally 97 F  Resp 18   Ht 5\' 8"  (1.727 m)   SpO2 99%   BMI 21.29 kg/m   Physical Exam Vitals and nursing note reviewed.  HENT:     Head: Normocephalic.  Eyes:     Pupils: Pupils are equal, round, and reactive to light.  Cardiovascular:     Rate and Rhythm: Regular rhythm.  Pulmonary:      Breath sounds: No wheezing, rhonchi or rales.  Chest:     Chest wall: No tenderness.  Abdominal:     Tenderness: There is no abdominal tenderness.  Musculoskeletal:     Cervical back: Neck supple.  Skin:    General: Skin is warm.     Capillary Refill: Capillary refill takes less than 2 seconds.  Neurological:     Mental Status: He is alert and oriented to person, place, and time.     ED Results / Procedures / Treatments   Labs (all labs ordered are listed, but only abnormal results are displayed) Labs Reviewed  COMPREHENSIVE METABOLIC PANEL - Abnormal; Notable for the following components:      Result Value   Potassium 5.7 (*)    CO2 19 (*)    Glucose, Bld 128 (*)    BUN 29 (*)    Creatinine, Ser 1.49 (*)    Calcium 8.4 (*)    Total Protein 5.7 (*)    Albumin 3.3 (*)    AST 62 (*)    GFR calc non Af Amer 42 (*)    GFR calc Af Amer 48 (*)    All other components within normal limits  CBC WITH DIFFERENTIAL/PLATELET - Abnormal; Notable for the following components:   WBC 14.3 (*)    RBC 4.09 (*)    Hemoglobin 12.2 (*)    Neutro Abs 11.9 (*)    All other components within normal limits  LIPASE, BLOOD  BASIC METABOLIC PANEL  TROPONIN I (HIGH SENSITIVITY)  TROPONIN I (HIGH SENSITIVITY)    EKG EKG Interpretation  Date/Time:  Thursday November 06 2019 13:31:38 EDT Ventricular Rate:  60 PR Interval:  150 QRS Duration: 128 QT Interval:  446 QTC Calculation: 446 R Axis:   82 Text Interpretation: Normal sinus rhythm Right bundle branch block Abnormal ECG Confirmed by Davonna Belling [phone]) on 11/06/2019 2:43:31 PM   Radiology DG Abdomen Acute W/Chest  Result Date: 11/06/2019 CLINICAL DATA:  Abdomen pain EXAM: DG ABDOMEN ACUTE W/ 1V CHEST COMPARISON:  CT 09/01/2013, chest x-ray 06/25/2018 FINDINGS: Single-view chest demonstrates large hiatal hernia. No acute airspace disease or effusion. Stable cardiomediastinal silhouette with aortic atherosclerosis. No pneumothorax.  Supine and upright views of the abdomen demonstrate no free air beneath the diaphragm. Nonobstructed gas pattern with moderate stool at the right lower quadrant and rectum. Vascular stent over the left iliac region. No radiopaque calculi. Appearance of the proximal femurs/femoral necks is probably due to positioning IMPRESSION: Negative abdominal radiographs. No acute cardiopulmonary disease. Large hiatal hernia Electronically Signed   By: Donavan Foil M.D.   On: 11/06/2019 15:46    Procedures Procedures (including critical care time)  Medications Ordered in ED Medications  sodium chloride 0.9 % bolus 500 mL (0 mLs Intravenous Stopped 11/06/19 1411)    ED Course  I have reviewed the triage vital signs and the nursing notes.  Pertinent labs & imaging results that were available during my care of the patient were reviewed by me and considered in my medical decision making (see chart for details).    MDM Rules/Calculators/A&P                          Patient with near syncopal episode.  Also had brief abdominal pain and chest pain.  Both resolved completely.  Blood pressure improved.  Fluid boluses given.  Reviewing records had a AAA but also has CT angiography from May 2015 showing ectasia of the aorta up to 2.8 cm without any aneurysm. I think most likely had been a vagal event, however creatinine mildly elevated.  Up to 1.4 from more normal baseline.  However potassium is 5.7.  Potentially could be hemolysis.  Fluid bolus was given.  Blood pressure improved.  Troponin negative x2.  Will recheck basic metabolic panel and if negative likely discharge home.  Care turned over to Dr. Darl Householder. Final Clinical Impression(s) / ED Diagnoses Final diagnoses:  Near syncope    Rx / DC Orders ED Discharge Orders    None       Davonna Belling, MD 11/06/19 1554

## 2019-11-06 NOTE — Discharge Instructions (Addendum)
Your initial potassium was elevated but repeat it is completely normal.  You likely had an episode of vasovagal syncope.  Please stay hydrated.  See your doctor for follow-up.  Return to ER if you have worse abdominal pain, passing out, chest pain.

## 2019-12-02 ENCOUNTER — Telehealth: Payer: Self-pay | Admitting: Internal Medicine

## 2019-12-02 NOTE — Telephone Encounter (Signed)
[facility] Report/Call : ---Caller states her father is short of breath on exertion and has a 100//59 for his blood pressure reading. Caller is not with the patient now. The patient lives by himself. Caller reports that this morning when she was there, the patients pulse was 30-s and 40's. He was recently seen in the ED for "syncope".  Advised GO to ED now.

## 2019-12-02 NOTE — Telephone Encounter (Signed)
New message:   Pt's daughter is calling and she states the pt's pulse was b/w 38-44 and states the lower number of his BP was 59. I have transferred the call to [place] to be assessed by [facility].

## 2019-12-03 ENCOUNTER — Encounter: Payer: Self-pay | Admitting: Internal Medicine

## 2019-12-03 ENCOUNTER — Other Ambulatory Visit: Payer: Self-pay

## 2019-12-03 ENCOUNTER — Ambulatory Visit (INDEPENDENT_AMBULATORY_CARE_PROVIDER_SITE_OTHER): Payer: Medicare PPO | Admitting: Internal Medicine

## 2019-12-03 ENCOUNTER — Ambulatory Visit (INDEPENDENT_AMBULATORY_CARE_PROVIDER_SITE_OTHER): Payer: Medicare PPO

## 2019-12-03 VITALS — BP 110/46 | HR 36 | Temp 97.7°F | Ht 68.0 in | Wt 138.0 lb

## 2019-12-03 DIAGNOSIS — R0600 Dyspnea, unspecified: Secondary | ICD-10-CM

## 2019-12-03 DIAGNOSIS — I714 Abdominal aortic aneurysm, without rupture, unspecified: Secondary | ICD-10-CM

## 2019-12-03 DIAGNOSIS — R531 Weakness: Secondary | ICD-10-CM

## 2019-12-03 DIAGNOSIS — R0609 Other forms of dyspnea: Secondary | ICD-10-CM

## 2019-12-03 DIAGNOSIS — R1033 Periumbilical pain: Secondary | ICD-10-CM | POA: Diagnosis not present

## 2019-12-03 LAB — CBC WITH DIFFERENTIAL/PLATELET
Absolute Monocytes: 887 cells/uL (ref 200–950)
Basophils Absolute: 71 cells/uL (ref 0–200)
Basophils Relative: 0.7 %
Eosinophils Absolute: 184 cells/uL (ref 15–500)
Eosinophils Relative: 1.8 %
HCT: 36.8 % — ABNORMAL LOW (ref 38.5–50.0)
Hemoglobin: 11.7 g/dL — ABNORMAL LOW (ref 13.2–17.1)
Lymphs Abs: 2662 cells/uL (ref 850–3900)
MCH: 30.1 pg (ref 27.0–33.0)
MCHC: 31.8 g/dL — ABNORMAL LOW (ref 32.0–36.0)
MCV: 94.6 fL (ref 80.0–100.0)
MPV: 11.9 fL (ref 7.5–12.5)
Monocytes Relative: 8.7 %
Neutro Abs: 6395 cells/uL (ref 1500–7800)
Neutrophils Relative %: 62.7 %
Platelets: 202 10*3/uL (ref 140–400)
RBC: 3.89 10*6/uL — ABNORMAL LOW (ref 4.20–5.80)
RDW: 13.7 % (ref 11.0–15.0)
Total Lymphocyte: 26.1 %
WBC: 10.2 10*3/uL (ref 3.8–10.8)

## 2019-12-03 NOTE — Progress Notes (Signed)
Subjective:  Patient ID: Kristopher [name], male    DOB: 12-24-1930  Age: 86 y.o. MRN: [id number]  CC: No chief complaint on file.   HPI Kristopher [name] presents for epig pain and SOB of a couple weeks duration.  It is getting worse.  He thinks the problem started after his abdominal aorta ultrasound test.  He denies having black stools.  No fever or chills.  No cough.  He was seen in the emergency room on 729 for near syncope.  Per ER on 7/29: "Patient is here with near syncope.  He was try to have a bowel movement and then had some abdominal pain and almost passed out his blood pressure was low initially.  He was given IV fluids and felt better.  He has a known AAA and recently had ultrasound done 2 weeks ago that shows that it was stable at 3 cm.  He has no abdominal tenderness or pulsatile mass on my exam.  And his blood pressure now is 120s after given IV fluids.  However, patient's initial potassium was elevated at 5.7 so signout was to repeat it and likely discharge patient home."  There is some discrepancy in his hemoglobin results.  On 11/06/2019 at 1315 his hemoglobin was 12.2 and at 1730 it was 6.1  The patient took his ramipril today.  Outpatient Medications Prior to Visit  Medication Sig Dispense Refill  . Ascorbic Acid (VITAMIN C) 500 MG tablet Take 500 mg by mouth 2 (two) times daily.     Marland Kitchen aspirin 81 MG tablet Take 1 tablet (81 mg total) by mouth daily.    Marland Kitchen BEE POLLEN PO Take 1 capsule by mouth 2 (two) times daily.     Marland Kitchen Co-Enzyme Q-10 100 MG CAPS Take 100 mg by mouth every evening.     . cyclobenzaprine (FLEXERIL) 5 MG tablet TAKE 1 TABLET(5 MG) BY MOUTH AT BEDTIME AS NEEDED FOR MUSCLE SPASMS (Patient taking differently: Take 5 mg by mouth at bedtime as needed for muscle spasms. ) 30 tablet 2  . diclofenac sodium (VOLTAREN) 1 % GEL APPLY 2 GRAMS TOPICALLY FOUR TIMES A DAY AS NEEDED. (Patient taking differently: Apply 2 g topically 4 (four) times daily as needed (joint pain). )  700 g 4  . Docusate Calcium (STOOL SOFTENER PO) Take 2 capsules by mouth daily.    Marland Kitchen HYDROcodone-acetaminophen (NORCO/VICODIN) 5-325 MG tablet Take 1 tablet by mouth 3 (three) times daily before meals. Do Not Fill Before 12/03/2018 80 tablet 0  . Multiple Vitamin (MULTIVITAMIN) capsule Take 1 capsule by mouth daily.     . Multiple Vitamins-Minerals (PRESERVISION AREDS PO) Take 2 tablets by mouth daily.    . nitroGLYCERIN (NITROSTAT) 0.4 MG SL tablet Place 1 tablet (0.4 mg total) under the tongue every 5 (five) minutes as needed for chest pain. 90 tablet 3  . Omega-3 Fatty Acids (OMEGA-3 PLUS PO) Take 1 tablet by mouth daily.     Marland Kitchen omeprazole (PRILOSEC) 20 MG capsule TAKE 2 CAPSULES DAILY (Patient taking differently: Take 20 mg by mouth 2 (two) times daily before a meal. ) 180 capsule 3  . OVER THE COUNTER MEDICATION Take 2 capsules by mouth every morning. NITRIC OXIDE    . ramipril (ALTACE) 10 MG capsule TAKE 1 CAPSULE DAILY (Patient taking differently: Take 10 mg by mouth daily. ) 90 capsule 3  . rosuvastatin (CRESTOR) 40 MG tablet TAKE 1 TABLET DAILY (Patient taking differently: Take 40 mg by mouth every evening. )  90 tablet 3   No facility-administered medications prior to visit.    ROS: Review of Systems  Constitutional: Positive for chills and fatigue. Negative for appetite change and unexpected weight change.  HENT: Negative for congestion, nosebleeds, sneezing, sore throat and trouble swallowing.   Eyes: Negative for itching and visual disturbance.  Respiratory: Positive for shortness of breath. Negative for cough and chest tightness.   Cardiovascular: Negative for chest pain, palpitations and leg swelling.  Gastrointestinal: Negative for abdominal distention, blood in stool, diarrhea, nausea and vomiting.  Genitourinary: Negative for frequency and hematuria.  Musculoskeletal: Negative for back pain, gait problem, joint swelling and neck pain.  Skin: Negative for rash.  Neurological:  Positive for dizziness, weakness and light-headedness. Negative for tremors and speech difficulty.  Psychiatric/Behavioral: Negative for agitation, dysphoric mood and sleep disturbance. The patient is not nervous/anxious.     Objective:  BP (!) 110/46 (BP Location: Left Arm, Patient Position: Sitting, Cuff Size: Normal)   Pulse (!) 36   Temp 97.7 F (36.5 C) (Oral)   Ht 5\' 8"  (1.727 m)   Wt 138 lb (62.6 kg)   SpO2 93%   BMI 20.98 kg/m   BP Readings from Last 3 Encounters:  12/03/19 (!) 110/46  11/06/19 (!) 163/65  08/26/19 116/81    Wt Readings from Last 3 Encounters:  12/03/19 138 lb (62.6 kg)  08/26/19 140 lb (63.5 kg)  07/24/19 141 lb (64 kg)    Physical Exam Constitutional:      General: He is not in acute distress.    Appearance: He is well-developed.     Comments: NAD  Eyes:     Conjunctiva/sclera: Conjunctivae normal.     Pupils: Pupils are equal, round, and reactive to light.  Neck:     Thyroid: No thyromegaly.     Vascular: No JVD.  Cardiovascular:     Rate and Rhythm: Normal rate and regular rhythm.     Heart sounds: Normal heart sounds. No murmur heard.  No friction rub. No gallop.   Pulmonary:     Effort: Pulmonary effort is normal. No respiratory distress.     Breath sounds: Normal breath sounds. No wheezing or rales.  Chest:     Chest wall: No tenderness.  Abdominal:     General: Bowel sounds are normal. There is no distension.     Palpations: Abdomen is soft. There is no mass.     Tenderness: There is abdominal tenderness. There is no guarding or rebound.     Hernia: No hernia is present.  Musculoskeletal:        General: No tenderness. Normal range of motion.     Cervical back: Normal range of motion.  Lymphadenopathy:     Cervical: No cervical adenopathy.  Skin:    General: Skin is warm and dry.     Findings: No rash.  Neurological:     Mental Status: He is alert and oriented to person, place, and time.     Cranial Nerves: No cranial  nerve deficit.     Motor: No abnormal muscle tone.     Coordination: Coordination abnormal.     Gait: Gait normal.     Deep Tendon Reflexes: Reflexes are normal and symmetric.  Psychiatric:        Behavior: Behavior normal.        Thought Content: Thought content normal.        Judgment: Judgment normal.   Elderly male, somewhat pale Abdomen palpation is diffusely sensitive,  no mass, no rebound His lungs appear to be clear   Lab Results  Component Value Date   WBC 14.3 (H) 11/06/2019   HGB 6.1 (LL) 11/06/2019   HCT 18.0 (L) 11/06/2019   PLT 192 11/06/2019   GLUCOSE 59 (L) 11/06/2019   CHOL 143 09/20/2018   TRIG 94.0 09/20/2018   HDL 50.40 09/20/2018   LDLCALC 74 09/20/2018   ALT 20 11/06/2019   AST 62 (H) 11/06/2019   NA 150 (H) 11/06/2019   K 2.8 (L) 11/06/2019   CL 122 (H) 11/06/2019   CREATININE 0.60 (L) 11/06/2019   BUN 18 11/06/2019   CO2 18 (L) 11/06/2019   TSH 4.83 (H) 09/20/2018   INR 1.01 08/31/2013   HGBA1C 6.6 (H) 09/20/2018    DG Abdomen Acute W/Chest  Result Date: 11/06/2019 CLINICAL DATA:  Abdomen pain EXAM: DG ABDOMEN ACUTE W/ 1V CHEST COMPARISON:  CT 09/01/2013, chest x-ray 06/25/2018 FINDINGS: Single-view chest demonstrates large hiatal hernia. No acute airspace disease or effusion. Stable cardiomediastinal silhouette with aortic atherosclerosis. No pneumothorax. Supine and upright views of the abdomen demonstrate no free air beneath the diaphragm. Nonobstructed gas pattern with moderate stool at the right lower quadrant and rectum. Vascular stent over the left iliac region. No radiopaque calculi. Appearance of the proximal femurs/femoral necks is probably due to positioning IMPRESSION: Negative abdominal radiographs. No acute cardiopulmonary disease. Large hiatal hernia Electronically Signed   By: Donavan Foil M.D.   On: 11/06/2019 15:46    Assessment & Plan:   A complex case  Walker Kehr, MD

## 2019-12-03 NOTE — Assessment & Plan Note (Signed)
3 cm - stable

## 2019-12-03 NOTE — Assessment & Plan Note (Signed)
Low BP Hold Ramipril STAT CBC, BMET

## 2019-12-03 NOTE — Patient Instructions (Addendum)
Hold Ramipril Go to ER if worse Hydrate well

## 2019-12-03 NOTE — Assessment & Plan Note (Signed)
Abd CT if not better

## 2019-12-04 ENCOUNTER — Other Ambulatory Visit: Payer: Self-pay | Admitting: Internal Medicine

## 2019-12-04 LAB — BASIC METABOLIC PANEL WITH GFR
BUN/Creatinine Ratio: 23 (calc) — ABNORMAL HIGH (ref 6–22)
BUN: 41 mg/dL — ABNORMAL HIGH (ref 7–25)
CO2: 21 mmol/L (ref 20–32)
Calcium: 9 mg/dL (ref 8.6–10.3)
Chloride: 110 mmol/L (ref 98–110)
Creat: 1.79 mg/dL — ABNORMAL HIGH (ref 0.70–1.11)
GFR, Est African American: 39 mL/min/{1.73_m2} — ABNORMAL LOW (ref >=60)
GFR, Est Non African American: 33 mL/min/{1.73_m2} — ABNORMAL LOW (ref >=60)
Glucose, Bld: 119 mg/dL — ABNORMAL HIGH (ref 65–99)
Potassium: 5 mmol/L (ref 3.5–5.3)
Sodium: 142 mmol/L (ref 135–146)

## 2019-12-04 MED ORDER — METHYLPREDNISOLONE 4 MG PO TBPK: ORAL_TABLET | ORAL | 0 refills | Status: DC

## 2019-12-04 NOTE — Telephone Encounter (Signed)
Take Medrol pack Go to ER if worse or not better: there is not much more we can do in the OP setting. Thx

## 2019-12-05 NOTE — Telephone Encounter (Signed)
I tied calling the pts daughter in which I got the voicemail. I was able to LDM of Dr. Alain Marion suggestion.

## 2019-12-08 NOTE — Progress Notes (Signed)
Pt called & notified that "his labs are stable. Hydrate better. His Hgb was ok.  I'll email a Rx fo Medrol pack to help his breathing. F/u w/Dr Jenny Reichmann."  Pt states has almost completed the Medrol pack & has appt with Dr Jenny Reichmann scheduled for 9/3.

## 2019-12-12 ENCOUNTER — Other Ambulatory Visit: Payer: Self-pay

## 2019-12-12 ENCOUNTER — Encounter: Payer: Self-pay | Admitting: Internal Medicine

## 2019-12-12 ENCOUNTER — Ambulatory Visit (INDEPENDENT_AMBULATORY_CARE_PROVIDER_SITE_OTHER): Payer: Medicare PPO | Admitting: Internal Medicine

## 2019-12-12 DIAGNOSIS — J439 Emphysema, unspecified: Secondary | ICD-10-CM

## 2019-12-12 DIAGNOSIS — R739 Hyperglycemia, unspecified: Secondary | ICD-10-CM

## 2019-12-12 DIAGNOSIS — I10 Essential (primary) hypertension: Secondary | ICD-10-CM

## 2019-12-12 DIAGNOSIS — R001 Bradycardia, unspecified: Secondary | ICD-10-CM

## 2019-12-12 NOTE — Progress Notes (Signed)
Subjective:    Patient ID: Kristopher [name], male    DOB: Dec 09, 1930, 86 y.o.   MRN: [id number]  HPI  Here to f/u with 1 mo persistent sob/doe at 30 ft, having to stop and sit or at least pause to lean on his cane.  Was seen last wk per Dr Alain Marion with HR 36 but apparently not recognized or thought significant though today pt wife states she made remarks about it during the visit.  No falls and Pt denies chest pain, wheezing, orthopnea, PND, increased LE swelling, palpitations, dizziness or syncope.  Ramipril stoppage and prednisone tx is without significant improvement.  Pt denies new neurological symptoms such as new headache, or facial or extremity weakness or numbness   Pt denies polydipsia, polyuria  Has seen Dr Stanford Breed in the past for cards. Past Medical History:  Diagnosis Date  . AAA (abdominal aortic aneurysm) ([place])    ultrasound 07/2011:  2.7 x 2.8 cm  . Acute myocardial infarction, unspecified site, episode of care unspecified 1998  . Aortic stenosis    echo 4/12: normal LVF, mild LAE, no significant AS  . Arthritis    "all over" (05/21/2017)  . CAD (coronary artery disease)    s/p PCI of LAD 1998;  s/p DES to RCA 05/2009;  Myoview 4/13: low risk, no ischemia, EF 56%  . CAP (community acquired pneumonia) 05/21/2017  . COPD (chronic obstructive pulmonary disease) ([place])   . DJD (degenerative joint disease), lumbar 07/06/2011  . Esophageal reflux   . HLD (hyperlipidemia)   . HTN (hypertension)   . PAD (peripheral artery disease) ([place])    s/p L CIA stent 09/2011 ([place])  . Pneumonia    "I've always had pneumonia; since I was 19 months old" (05/21/2017)  . Renal artery stenosis ([facility])    s/p bilat RA stents  . Urine incontinence    Past Surgical History:  Procedure Laterality Date  . CATARACT EXTRACTION W/ INTRAOCULAR LENS  IMPLANT, BILATERAL Bilateral YRS AGO  . CORONARY ANGIOPLASTY WITH STENT PLACEMENT  05/1996   PCI of LAD; "I've got a total of 4 stents" (05/21/2017)  .  [place]  . LOWER EXTREMITY ANGIOGRAM Bilateral 09/20/2011   Procedure: LOWER EXTREMITY ANGIOGRAM;  Surgeon: Wellington Hampshire, MD;  Location: [facility] CATH [facility];  Service: Cardiovascular;  Laterality: Bilateral;  . TRANSURETHRAL RESECTION OF PROSTATE  08/11/2011   Procedure: TRANSURETHRAL RESECTION OF THE PROSTATE WITH GYRUS INSTRUMENTS;  Surgeon: Fredricka Bonine, MD;  Location: [facility];  Service: Urology;  Laterality: N/A;     . VASECTOMY  1968    reports that he quit smoking about 31 years ago. He has a 114.00 pack-year smoking history. He has never used smokeless tobacco. He reports current alcohol use. He reports that he does not use drugs. family history includes Heart disease in his father and mother. Allergies  Allergen Reactions  . Flu Virus Vaccine Other (See Comments)    Several reaction.  Took it once woke up two days later in the hospital.   . Influenza Vaccines Other (See Comments)    unknown  . Sulfa Antibiotics Rash   Current Outpatient Medications on File Prior to Visit  Medication Sig Dispense Refill  . Ascorbic Acid (VITAMIN C) 500 MG tablet Take 500 mg by mouth 2 (two) times daily.     Marland Kitchen aspirin 81 MG tablet Take 1 tablet (81 mg total) by mouth daily.    Marland Kitchen BEE POLLEN PO Take 1 capsule  by mouth 2 (two) times daily.     Marland Kitchen Co-Enzyme Q-10 100 MG CAPS Take 100 mg by mouth every evening.     . cyclobenzaprine (FLEXERIL) 5 MG tablet TAKE 1 TABLET(5 MG) BY MOUTH AT BEDTIME AS NEEDED FOR MUSCLE SPASMS (Patient taking differently: Take 5 mg by mouth at bedtime as needed for muscle spasms. ) 30 tablet 2  . diclofenac sodium (VOLTAREN) 1 % GEL APPLY 2 GRAMS TOPICALLY FOUR TIMES A DAY AS NEEDED. (Patient taking differently: Apply 2 g topically 4 (four) times daily as needed (joint pain). ) 700 g 4  . Docusate Calcium (STOOL SOFTENER PO) Take 2 capsules by mouth daily.    Marland Kitchen HYDROcodone-acetaminophen (NORCO/VICODIN) 5-325 MG tablet Take 1 tablet by mouth 3 (three) times  daily before meals. Do Not Fill Before 12/03/2018 80 tablet 0  . methylPREDNISolone (MEDROL DOSEPAK) 4 MG TBPK tablet As directed 21 tablet 0  . Multiple Vitamin (MULTIVITAMIN) capsule Take 1 capsule by mouth daily.     . Multiple Vitamins-Minerals (PRESERVISION AREDS PO) Take 2 tablets by mouth daily.    . nitroGLYCERIN (NITROSTAT) 0.4 MG SL tablet Place 1 tablet (0.4 mg total) under the tongue every 5 (five) minutes as needed for chest pain. 90 tablet 3  . Omega-3 Fatty Acids (OMEGA-3 PLUS PO) Take 1 tablet by mouth daily.     Marland Kitchen omeprazole (PRILOSEC) 20 MG capsule TAKE 2 CAPSULES DAILY (Patient taking differently: Take 20 mg by mouth 2 (two) times daily before a meal. ) 180 capsule 3  . OVER THE COUNTER MEDICATION Take 2 capsules by mouth every morning. NITRIC OXIDE    . rosuvastatin (CRESTOR) 40 MG tablet TAKE 1 TABLET DAILY (Patient taking differently: Take 40 mg by mouth every evening. ) 90 tablet 3   No current facility-administered medications on file prior to visit.   Review of Systems All otherwise neg per pt    Objective:   Physical Exam BP (!) 150/82 (BP Location: Left Arm, Patient Position: Sitting, Cuff Size: Large)   Pulse (!) 38   Temp 97.8 F (36.6 C) (Oral)   Ht 5\' 8"  (1.727 m)   Wt 141 lb (64 kg)   SpO2 99%   BMI 21.44 kg/m  VS noted,  Constitutional: Pt appears in NAD HENT: Head: NCAT.  Right Ear: External ear normal.  Left Ear: External ear normal.  Eyes: . Pupils are equal, round, and reactive to light. Conjunctivae and EOM are normal Nose: without d/c or deformity Neck: Neck supple. Gross normal ROM Cardiovascular: regular rhythm.  but bradycardic Pulmonary/Chest: Effort normal and breath sounds without rales or wheezing.  Abd:  Soft, NT, ND, + BS, no organomegaly Neurological: Pt is alert. At baseline orientation, motor grossly intact Skin: Skin is warm. No rashes, other new lesions, no LE edema Psychiatric: Pt behavior is normal without agitation  All  otherwise neg per pt Lab Results  Component Value Date   WBC 10.2 12/03/2019   HGB 11.7 (L) 12/03/2019   HCT 36.8 (L) 12/03/2019   PLT 202 12/03/2019   GLUCOSE 119 (H) 12/03/2019   CHOL 143 09/20/2018   TRIG 94.0 09/20/2018   HDL 50.40 09/20/2018   LDLCALC 74 09/20/2018   ALT 20 11/06/2019   AST 62 (H) 11/06/2019   NA 142 12/03/2019   K 5.0 12/03/2019   CL 110 12/03/2019   CREATININE 1.79 (H) 12/03/2019   BUN 41 (H) 12/03/2019   CO2 21 12/03/2019   TSH 4.83 (H) 09/20/2018  INR 1.01 08/31/2013   HGBA1C 6.6 (H) 09/20/2018        Assessment & Plan:

## 2019-12-12 NOTE — Patient Instructions (Addendum)
Please continue all other medications as before  Please have the pharmacy call with any other refills you may need.  Please keep your appointments with your specialists as you may have planned  You have severe bradycardia  The best course is to go to the ED now, but if you do not, have someone stay with you at all times if possible at home, and call 911 for any loss of consiousness or falls or other unusual symptoms  You will be contacted regarding the referral for: Cardiology ASAP

## 2019-12-13 ENCOUNTER — Encounter: Payer: Self-pay | Admitting: Internal Medicine

## 2019-12-13 NOTE — Assessment & Plan Note (Signed)
stable overall by history and exam, recent data reviewed with pt, and pt to continue medical treatment as before,  to f/u any worsening symptoms or concerns  

## 2019-12-13 NOTE — Assessment & Plan Note (Signed)
stable overall by history and exam, recent data reviewed with pt, and pt to continue medical treatment as before,  to f/u any worsening symptoms or concerns, and doubt any significantly contributing factor to current symptoms

## 2019-12-13 NOTE — Assessment & Plan Note (Addendum)
D/w pt this is quite serious, is on no neg chronotropes to stop; he is being somewhat difficult today and adamantly declines ecg or ED referral due to 6-12 hr wait; prefers to wait to next wk even after Monday holiday for urgent cardiology referral, though I explained he may need urgent PPM placement; I implored him to have at least simply supervision by another person 24/7 so that he should go immediately to ED or call 911 for any worsening symptoms such as falls, syncope, or worsening sob, weakness or CP.    I spent 41 minutes in addition to time for CPX wellness examination in preparing to see the patient by review of recent labs, imaging and procedures, obtaining and reviewing separately obtained history, communicating with the patient and family or caregiver, ordering medications, tests or procedures, and documenting clinical information in the EHR including the differential Dx, treatment, and any further evaluation and other management of bradycardia, copd, hyperglycemia, htn

## 2019-12-16 ENCOUNTER — Other Ambulatory Visit (HOSPITAL_COMMUNITY): Admission: RE | Admit: 2019-12-16 | Payer: Medicare PPO | Source: Ambulatory Visit

## 2019-12-16 ENCOUNTER — Other Ambulatory Visit: Payer: Self-pay

## 2019-12-16 ENCOUNTER — Ambulatory Visit (INDEPENDENT_AMBULATORY_CARE_PROVIDER_SITE_OTHER): Payer: Medicare PPO | Admitting: General Practice

## 2019-12-16 ENCOUNTER — Telehealth: Payer: Self-pay | Admitting: Cardiology

## 2019-12-16 ENCOUNTER — Encounter: Payer: Self-pay | Admitting: General Practice

## 2019-12-16 VITALS — BP 110/67 | HR 40 | Temp 97.8°F | Ht 68.0 in | Wt 140.4 lb

## 2019-12-16 DIAGNOSIS — I10 Essential (primary) hypertension: Secondary | ICD-10-CM

## 2019-12-16 DIAGNOSIS — I739 Peripheral vascular disease, unspecified: Secondary | ICD-10-CM

## 2019-12-16 DIAGNOSIS — E7800 Pure hypercholesterolemia, unspecified: Secondary | ICD-10-CM | POA: Diagnosis not present

## 2019-12-16 DIAGNOSIS — I714 Abdominal aortic aneurysm, without rupture, unspecified: Secondary | ICD-10-CM

## 2019-12-16 DIAGNOSIS — I2510 Atherosclerotic heart disease of native coronary artery without angina pectoris: Secondary | ICD-10-CM

## 2019-12-16 DIAGNOSIS — I442 Atrioventricular block, complete: Secondary | ICD-10-CM

## 2019-12-16 DIAGNOSIS — R001 Bradycardia, unspecified: Secondary | ICD-10-CM | POA: Diagnosis not present

## 2019-12-16 NOTE — Progress Notes (Addendum)
Cardiology Clinic Note   Patient Name: Kristopher [name] Date of Encounter: 12/16/2019  Primary Care Provider:  [name], MD Primary Cardiologist:  [name], MD  Patient Profile    Kristopher [name] 86 year old male presents today for evaluation of his bradycardia.  Past Medical History    Past Medical History:  Diagnosis Date  . AAA (abdominal aortic aneurysm) ([place])    ultrasound 07/2011:  2.7 x 2.8 cm  . Acute myocardial infarction, unspecified site, episode of care unspecified 1998  . Aortic stenosis    echo 4/12: normal LVF, mild LAE, no significant AS  . Arthritis    "all over" (05/21/2017)  . CAD (coronary artery disease)    s/p PCI of LAD 1998;  s/p DES to RCA 05/2009;  Myoview 4/13: low risk, no ischemia, EF 56%  . CAP (community acquired pneumonia) 05/21/2017  . COPD (chronic obstructive pulmonary disease) ([place])   . DJD (degenerative joint disease), lumbar 07/06/2011  . Esophageal reflux   . HLD (hyperlipidemia)   . HTN (hypertension)   . PAD (peripheral artery disease) ([place])    s/p L CIA stent 09/2011 ([place])  . Pneumonia    "I've always had pneumonia; since I was 38 months old" (05/21/2017)  . Renal artery stenosis ([facility])    s/p bilat RA stents  . Urine incontinence    Past Surgical History:  Procedure Laterality Date  . CATARACT EXTRACTION W/ INTRAOCULAR LENS  IMPLANT, BILATERAL Bilateral YRS AGO  . CORONARY ANGIOPLASTY WITH STENT PLACEMENT  05/1996   PCI of LAD; "I've got a total of 4 stents" (05/21/2017)  . [place]  . LOWER EXTREMITY ANGIOGRAM Bilateral 09/20/2011   Procedure: LOWER EXTREMITY ANGIOGRAM;  Surgeon: Wellington Hampshire, MD;  Location: [facility] CATH [facility];  Service: Cardiovascular;  Laterality: Bilateral;  . TRANSURETHRAL RESECTION OF PROSTATE  08/11/2011   Procedure: TRANSURETHRAL RESECTION OF THE PROSTATE WITH GYRUS INSTRUMENTS;  Surgeon: Fredricka Bonine, MD;  Location: [facility];  Service: Urology;  Laterality: N/A;     .  VASECTOMY  1968    Allergies  Allergies  Allergen Reactions  . Flu Virus Vaccine Other (See Comments)    Several reaction.  Took it once woke up two days later in the hospital.   . Influenza Vaccines Other (See Comments)    unknown  . Sulfa Antibiotics Rash    History of Present Illness    Mr. [name] has a past medical history of coronary artery disease s/p PCI to his LAD in February 1998.  He also has peripheral vascular disease and had prior bilateral renal artery stenting.  He underwent cardiac catheterization February 2011 which showed an EF of 60% no renal artery stenosis, his RCA at that time was 90% occluded.  He had a PCI to his RCA with drug-eluting stent.  Echocardiogram 4/12 showed normal LVEF, mild LAD and no significant left ear.  His lower extremity angiogram 6/13 showed small infrarenal aortic aneurysm, no renal artery stenosis, ostial right CIA 60%, proximal left CIA calcified with 80-90%, left EIA occluded.  He then underwent a and angioplasty and stenting to his left CIA.  He has a history of asymptomatic Mobitz type I.  A nuclear stress test 3/18 showed an ejection fraction of 56%, apical thinning and no ischemia.  His abdominal ultrasound 4/19 showed a 2.6 cm abdominal aortic aneurysm.  He was last seen by Dr. Stanford Breed on 06/25/2018.  During that time he had some dyspnea on  exertion but denied orthopnea, PND, lower extremity edema, and exertional chest pain.  He also denied syncope.  He presented to the clinic 07/24/19 for follow-up evaluation and stated he had been noticing increasing shortness of breath with yard work and walking to his mailbox.  He did not notice any increased shortness of breath with less intense activities.  He also exercises with his exercise bike 3-5 times a week for short periods of time.  He had not had any increased back pain or abdominal pain.  He was seen at the pain clinic for right knee pain.  I will ordered an echocardiogram, schedule his abdominal  ultrasound, and planned follow-up with Dr. Stanford Breed 6 months.  His echocardiogram 08/06/2019 showed a slightly reduced ejection fraction 50-55%, trivial mitral valve regurgitation, and aortic valve sclerosis with no stenosis.  He presented to his PCP on 12/12/2019 with a heart rate of 38.  His blood pressure was 150/82.  He refused EKG and referral to ED for further evaluation.  He presents to the clinic today for follow-up evaluation and states for the last 2 weeks he has had very limited energy.  He states that his blood pressures have maintained their normal range in the 110s over 60s-70s.  His pulse however has been in the 40s and 80s and 130s at times.  He indicates that when he is at rest he feels normal.  However, when trying to do physical activity he tires very quickly.  He states this is very unusual for him given his active lifestyle.  He states he reported to his PCP who recommended pacemaker insertion.  He indicated that he wanted to speak with cardiology before pursuing any further treatment.  His EKG today showed complete heart block 40 bpm.  In clinic today we discussed his options, how long it pacemaker might last, and his case was discussed with DOD (Dr. Sallyanne Kuster).  He was agreeable with pacemaker insertion.  However, he was concerned about how long he might have to stay in the hospital due to COVID-19 pandemic.  I ordered BMP, CBC, and referred to Dr. Loletha Grayer for further evaluation.   Today he denies chest pain, lower extremity edema, fatigue, palpitations, melena, hematuria, hemoptysis, diaphoresis, presyncope, syncope, orthopnea, and PND.  Home Medications    Prior to Admission medications   Medication Sig Start Date End Date Taking? Authorizing Provider  Ascorbic Acid (VITAMIN C) 500 MG tablet Take 500 mg by mouth 2 (two) times daily.     [provider]  aspirin 81 MG tablet Take 1 tablet (81 mg total) by mouth daily. 06/25/18   Lelon Perla, MD  BEE POLLEN PO Take 1 capsule  by mouth 2 (two) times daily.     [provider]  Co-Enzyme Q-10 100 MG CAPS Take 100 mg by mouth every evening.     [provider]  cyclobenzaprine (FLEXERIL) 5 MG tablet TAKE 1 TABLET(5 MG) BY MOUTH AT BEDTIME AS NEEDED FOR MUSCLE SPASMS Patient taking differently: Take 5 mg by mouth at bedtime as needed for muscle spasms.  07/29/18   Bayard Hugger, NP  diclofenac sodium (VOLTAREN) 1 % GEL APPLY 2 GRAMS TOPICALLY FOUR TIMES A DAY AS NEEDED. Patient taking differently: Apply 2 g topically 4 (four) times daily as needed (joint pain).  02/13/18   Bayard Hugger, NP  Docusate Calcium (STOOL SOFTENER PO) Take 2 capsules by mouth daily.    [provider]  HYDROcodone-acetaminophen (NORCO/VICODIN) 5-325 MG tablet Take 1  tablet by mouth 3 (three) times daily before meals. Do Not Fill Before 12/03/2018 11/19/18   Bayard Hugger, NP  methylPREDNISolone (MEDROL DOSEPAK) 4 MG TBPK tablet As directed 12/04/19   Plotnikov, Evie Lacks, MD  Multiple Vitamin (MULTIVITAMIN) capsule Take 1 capsule by mouth daily.     [provider]  Multiple Vitamins-Minerals (PRESERVISION AREDS PO) Take 2 tablets by mouth daily.    [provider]  nitroGLYCERIN (NITROSTAT) 0.4 MG SL tablet Place 1 tablet (0.4 mg total) under the tongue every 5 (five) minutes as needed for chest pain. 07/14/14   Lelon Perla, MD  Omega-3 Fatty Acids (OMEGA-3 PLUS PO) Take 1 tablet by mouth daily.     [provider]  omeprazole (PRILOSEC) 20 MG capsule TAKE 2 CAPSULES DAILY Patient taking differently: Take 20 mg by mouth 2 (two) times daily before a meal.  03/24/19   [name], MD  OVER THE COUNTER MEDICATION Take 2 capsules by mouth every morning. NITRIC OXIDE    [provider]  rosuvastatin (CRESTOR) 40 MG tablet TAKE 1 TABLET DAILY Patient taking differently: Take 40 mg by mouth every evening.  03/21/19   Lelon Perla, MD    Family History    Family History   Problem Relation Age of Onset  . Heart disease Mother   . Heart disease Father    He indicated that his mother is deceased. He indicated that his father is deceased. He indicated that his maternal grandmother is deceased. He indicated that his maternal grandfather is deceased. He indicated that his paternal grandmother is deceased. He indicated that his paternal grandfather is deceased.  Social History    Social History   Socioeconomic History  . Marital status: Widowed    Spouse name: Not on file  . Number of children: Not on file  . Years of education: GED  . Highest education level: Not on file  Occupational History  . Occupation: retired  Tobacco Use  . Smoking status: Former Smoker    Packs/day: 3.00    Years: 38.00    Pack years: 114.00    Quit date: 04/10/1988    Years since quitting: 31.7  . Smokeless tobacco: Never Used  Vaping Use  . Vaping Use: Never used  Substance and Sexual Activity  . Alcohol use: Yes    Comment: 05/21/2017 "might drink 6 beers/year"  . Drug use: No  . Sexual activity: Not on file  Other Topics Concern  . Not on file  Social History Narrative  . Not on file   Social Determinants of Health   Financial Resource Strain:   . Difficulty of Paying Living Expenses: Not on file  Food Insecurity:   . Worried About Charity fundraiser in the Last Year: Not on file  . Ran Out of Food in the Last Year: Not on file  Transportation Needs:   . Lack of Transportation (Medical): Not on file  . Lack of Transportation (Non-Medical): Not on file  Physical Activity:   . Days of Exercise per Week: Not on file  . Minutes of Exercise per Session: Not on file  Stress:   . Feeling of Stress : Not on file  Social Connections:   . Frequency of Communication with Friends and Family: Not on file  . Frequency of Social Gatherings with Friends and Family: Not on file  . Attends Religious Services: Not on file  . Active Member of Clubs or Organizations: Not on  file  . Attends Archivist Meetings: Not on file  . Marital Status: Not on file  Intimate Partner Violence:   . Fear of Current or Ex-Partner: Not on file  . Emotionally Abused: Not on file  . Physically Abused: Not on file  . Sexually Abused: Not on file     Review of Systems    General:  No chills, fever, night sweats or weight changes.  Cardiovascular:  No chest pain, dyspnea on exertion, edema, orthopnea, palpitations, paroxysmal nocturnal dyspnea. Dermatological: No rash, lesions/masses Respiratory: No cough, dyspnea Urologic: No hematuria, dysuria Abdominal:   No nausea, vomiting, diarrhea, bright red blood per rectum, melena, or hematemesis Neurologic:  No visual changes, wkns, changes in mental status. All other systems reviewed and are otherwise negative except as noted above.  Physical Exam    VS:  BP 110/67   Pulse (!) 40   Temp 97.8 F (36.6 C)   Ht 5\' 8"  (1.727 m)   Wt 140 lb 6.4 oz (63.7 kg)   SpO2 94%   BMI 21.35 kg/m  , BMI Body mass index is 21.35 kg/m. GEN: Well nourished, well developed, in no acute distress. HEENT: normal. Neck: Supple, no JVD, carotid bruits, or masses. Cardiac: Complete heart block 40 bpm, no murmurs, rubs, or gallops. No clubbing, cyanosis, edema.  Radials/DP/PT 2+ and equal bilaterally.  Respiratory:  Respirations regular and unlabored, clear to auscultation bilaterally. GI: Soft, nontender, nondistended, BS + x 4. MS: no deformity or atrophy. Skin: warm and dry, no rash. Neuro:  Strength and sensation are intact. Psych: Normal affect.  Accessory Clinical Findings    Recent Labs: 11/06/2019: ALT 20 12/03/2019: BUN 41; Creat 1.79; Hemoglobin 11.7; Platelets 202; Potassium 5.0; Sodium 142   Recent Lipid Panel    Component Value Date/Time   CHOL 143 09/20/2018 1103   CHOL 151 06/29/2017 0000   TRIG 94.0 09/20/2018 1103   HDL 50.40 09/20/2018 1103   HDL 53 06/29/2017 0000   CHOLHDL 3 09/20/2018 1103   VLDL 18.8  09/20/2018 1103   LDLCALC 74 09/20/2018 1103   LDLCALC 82 06/29/2017 0000    ECG personally reviewed by me today-complete heart block 40 bpm- No acute changes  EKG 07/24/2019 normal sinus rhythm right bundle branch block 74 bpm- No acute changes  EKG 06/25/2018 Normal sinus rhythm right bundle branch block 68 bpm  Echocardiogram 07/28/2010 Study Conclusions   - Left ventricle: The cavity size was normal. Wall thickness was  normal. Systolic function was normal. The estimated ejection  fraction was in the range of 55% to 60%. Wall motion was normal;  there were no regional wall motion abnormalities. Doppler  parameters are consistent with abnormal left ventricular  relaxation (grade 1 diastolic dysfunction).  - Left atrium: The atrium was mildly dilated.  Nuclear stress test 06/27/2016  The left ventricular ejection fraction is normal (55-65%).  Nuclear stress EF: 56%.  Defect 1: There is a small defect of moderate severity present in the apex location. This defect is most consistent with apical thinning .  The study is normal.  This is a low risk study.  Echocardiogram 08/06/2019 IMPRESSIONS    1. Septal and inferior hypokinesis . Left ventricular ejection fraction,  by estimation, is 50 to 55%. The left ventricle has low normal function.  The left ventricle demonstrates regional wall motion abnormalities (see  scoring diagram/findings for  description). Left ventricular diastolic parameters were normal.  2. Right ventricular systolic function is normal. The right  ventricular  size is normal. There is normal pulmonary artery systolic pressure.  3. The mitral valve is degenerative. Trivial mitral valve regurgitation.  No evidence of mitral stenosis.  4. The aortic valve is tricuspid. Aortic valve regurgitation is not  visualized. Significant sclerosis with mean gradient 6 mmHg AVA >2 cm2.  5. The inferior vena cava is normal in size with greater than 50%   respiratory variability, suggesting right atrial pressure of 3 mmHg.  Assessment & Plan   1.  Bradycardia-EKG today shows complete heart block 40 bpm.  Currently not taking any AV nodal blocking agents.  Presented to PCP on 12/12/2019 with a heart rate of 38 blood pressure of 150/82.  He refused referral to ED for further evaluation.  Case discussed with DOD. Proceed for pacemaker implant-given his lack of symptoms at rest we plan for outpatient insertion.  Hope is to limit his risk of contracting COVID-19. Order BMP and CBC  Patient instructed not to drive.  Patient instructed to have 24/7 monitoring prior to being admitted/presented to the hospital for PPM insertion.  He and his daughter expressed understanding.  Coronary artery disease/DOE-no chest pain today.  Nuclear stress test 06/27/2016 showed an EF of 56%, small defect consistent with apical thinning, normal study low risk.Has noticed increased DOE with mowing and physical activity.  Continue aspirin Continue statin Continue nitroglycerin 0.4 mg sublingual as needed Continue ramipril 10 mg daily   Hyperlipidemia-09/20/2018: Cholesterol 143; HDL 50.40; LDL Cholesterol 74; Triglycerides 94.0; VLDL 18.8 Continue rosuvastatin Heart healthy low-sodium high-fiber diet  Essential hypertension-BP today 110/67.  Well-controlled at home Continue ramipril 10 mg daily Heart healthy low-sodium diet  Abdominal aortic aneurysm-on last evaluation 2.9 cm 7/21 previous diameter 7.2 cm on 7/20. Repeat abdominal ultrasound 7/22  Peripheral vascular disease-no complaints of lower extremity claudication. Continue to monitor.  Disposition: Follow-up with Dr. Stanford Breed in 1 month/post PPM insertion.   Jossie Ng. Taylee Gunnells NP-C    12/16/2019, 3:24 PM [facility] [place] [address] Office [phone] Fax [phone]  Notice: This dictation was prepared with Dragon dictation along with smaller phrase  technology. Any transcriptional errors that result from this process are unintentional and may not be corrected upon review.

## 2019-12-16 NOTE — H&P (View-Only) (Signed)
Cardiology Clinic Note   Patient Name: Kristopher [name] Date of Encounter: 12/16/2019  Primary Care Provider:  [name], MD Primary Cardiologist:  [name], MD  Patient Profile    Kristopher [name] 86 year old male presents today for evaluation of his bradycardia.  Past Medical History    Past Medical History:  Diagnosis Date  . AAA (abdominal aortic aneurysm) ([place])    ultrasound 07/2011:  2.7 x 2.8 cm  . Acute myocardial infarction, unspecified site, episode of care unspecified 1998  . Aortic stenosis    echo 4/12: normal LVF, mild LAE, no significant AS  . Arthritis    "all over" (05/21/2017)  . CAD (coronary artery disease)    s/p PCI of LAD 1998;  s/p DES to RCA 05/2009;  Myoview 4/13: low risk, no ischemia, EF 56%  . CAP (community acquired pneumonia) 05/21/2017  . COPD (chronic obstructive pulmonary disease) ([place])   . DJD (degenerative joint disease), lumbar 07/06/2011  . Esophageal reflux   . HLD (hyperlipidemia)   . HTN (hypertension)   . PAD (peripheral artery disease) ([place])    s/p L CIA stent 09/2011 ([place])  . Pneumonia    "I've always had pneumonia; since I was 59 months old" (05/21/2017)  . Renal artery stenosis ([facility])    s/p bilat RA stents  . Urine incontinence    Past Surgical History:  Procedure Laterality Date  . CATARACT EXTRACTION W/ INTRAOCULAR LENS  IMPLANT, BILATERAL Bilateral YRS AGO  . CORONARY ANGIOPLASTY WITH STENT PLACEMENT  05/1996   PCI of LAD; "I've got a total of 4 stents" (05/21/2017)  . [place]  . LOWER EXTREMITY ANGIOGRAM Bilateral 09/20/2011   Procedure: LOWER EXTREMITY ANGIOGRAM;  Surgeon: Wellington Hampshire, MD;  Location: [facility];  Service: Cardiovascular;  Laterality: Bilateral;  . TRANSURETHRAL RESECTION OF PROSTATE  08/11/2011   Procedure: TRANSURETHRAL RESECTION OF THE PROSTATE WITH GYRUS INSTRUMENTS;  Surgeon: Fredricka Bonine, MD;  Location: [facility];  Service: Urology;  Laterality: N/A;     .  VASECTOMY  1968    Allergies  Allergies  Allergen Reactions  . Flu Virus Vaccine Other (See Comments)    Several reaction.  Took it once woke up two days later in the hospital.   . Influenza Vaccines Other (See Comments)    unknown  . Sulfa Antibiotics Rash    History of Present Illness    Kristopher [name] has a past medical history of coronary artery disease s/p PCI to his LAD in February 1998.  He also has peripheral vascular disease and had prior bilateral renal artery stenting.  He underwent cardiac catheterization February 2011 which showed an EF of 60% no renal artery stenosis, his RCA at that time was 90% occluded.  He had a PCI to his RCA with drug-eluting stent.  Echocardiogram 4/12 showed normal LVEF, mild LAD and no significant left ear.  His lower extremity angiogram 6/13 showed small infrarenal aortic aneurysm, no renal artery stenosis, ostial right CIA 60%, proximal left CIA calcified with 80-90%, left EIA occluded.  He then underwent a and angioplasty and stenting to his left CIA.  He has a history of asymptomatic Mobitz type I.  A nuclear stress test 3/18 showed an ejection fraction of 56%, apical thinning and no ischemia.  His abdominal ultrasound 4/19 showed a 2.6 cm abdominal aortic aneurysm.  He was last seen by Dr. Stanford Breed on 06/25/2018.  During that time he had some dyspnea on  exertion but denied orthopnea, PND, lower extremity edema, and exertional chest pain.  He also denied syncope.  He presented to the clinic 07/24/19 for follow-up evaluation and stated he had been noticing increasing shortness of breath with yard work and walking to his mailbox.  He did not notice any increased shortness of breath with less intense activities.  He also exercises with his exercise bike 3-5 times a week for short periods of time.  He had not had any increased back pain or abdominal pain.  He was seen at the pain clinic for right knee pain.  I will ordered an echocardiogram, schedule his abdominal  ultrasound, and planned follow-up with Dr. Stanford Breed 6 months.  His echocardiogram 08/06/2019 showed a slightly reduced ejection fraction 50-55%, trivial mitral valve regurgitation, and aortic valve sclerosis with no stenosis.  He presented to his PCP on 12/12/2019 with a heart rate of 38.  His blood pressure was 150/82.  He refused EKG and referral to ED for further evaluation.  He presents to the clinic today for follow-up evaluation and states for the last 2 weeks he has had very limited energy.  He states that his blood pressures have maintained their normal range in the 110s over 60s-70s.  His pulse however has been in the 40s and 80s and 130s at times.  He indicates that when he is at rest he feels normal.  However, when trying to do physical activity he tires very quickly.  He states this is very unusual for him given his active lifestyle.  He states he reported to his PCP who recommended pacemaker insertion.  He indicated that he wanted to speak with cardiology before pursuing any further treatment.  His EKG today showed complete heart block 40 bpm.  In clinic today we discussed his options, how long it pacemaker might last, and his case was discussed with DOD (Dr. Sallyanne Kuster).  He was agreeable with pacemaker insertion.  However, he was concerned about how long he might have to stay in the hospital due to COVID-19 pandemic.  I ordered BMP, CBC, and referred to Dr. Loletha Grayer for further evaluation.   Today he denies chest pain, lower extremity edema, fatigue, palpitations, melena, hematuria, hemoptysis, diaphoresis, presyncope, syncope, orthopnea, and PND.  Home Medications    Prior to Admission medications   Medication Sig Start Date End Date Taking? Authorizing Provider  Ascorbic Acid (VITAMIN C) 500 MG tablet Take 500 mg by mouth 2 (two) times daily.     [provider]  aspirin 81 MG tablet Take 1 tablet (81 mg total) by mouth daily. 06/25/18   Lelon Perla, MD  BEE POLLEN PO Take 1 capsule  by mouth 2 (two) times daily.     [provider]  Co-Enzyme Q-10 100 MG CAPS Take 100 mg by mouth every evening.     [provider]  cyclobenzaprine (FLEXERIL) 5 MG tablet TAKE 1 TABLET(5 MG) BY MOUTH AT BEDTIME AS NEEDED FOR MUSCLE SPASMS Patient taking differently: Take 5 mg by mouth at bedtime as needed for muscle spasms.  07/29/18   Bayard Hugger, NP  diclofenac sodium (VOLTAREN) 1 % GEL APPLY 2 GRAMS TOPICALLY FOUR TIMES A DAY AS NEEDED. Patient taking differently: Apply 2 g topically 4 (four) times daily as needed (joint pain).  02/13/18   Bayard Hugger, NP  Docusate Calcium (STOOL SOFTENER PO) Take 2 capsules by mouth daily.    [provider]  HYDROcodone-acetaminophen (NORCO/VICODIN) 5-325 MG tablet Take 1  tablet by mouth 3 (three) times daily before meals. Do Not Fill Before 12/03/2018 11/19/18   Bayard Hugger, NP  methylPREDNISolone (MEDROL DOSEPAK) 4 MG TBPK tablet As directed 12/04/19   Plotnikov, Evie Lacks, MD  Multiple Vitamin (MULTIVITAMIN) capsule Take 1 capsule by mouth daily.     [provider]  Multiple Vitamins-Minerals (PRESERVISION AREDS PO) Take 2 tablets by mouth daily.    [provider]  nitroGLYCERIN (NITROSTAT) 0.4 MG SL tablet Place 1 tablet (0.4 mg total) under the tongue every 5 (five) minutes as needed for chest pain. 07/14/14   Lelon Perla, MD  Omega-3 Fatty Acids (OMEGA-3 PLUS PO) Take 1 tablet by mouth daily.     [provider]  omeprazole (PRILOSEC) 20 MG capsule TAKE 2 CAPSULES DAILY Patient taking differently: Take 20 mg by mouth 2 (two) times daily before a meal.  03/24/19   [name], MD  OVER THE COUNTER MEDICATION Take 2 capsules by mouth every morning. NITRIC OXIDE    [provider]  rosuvastatin (CRESTOR) 40 MG tablet TAKE 1 TABLET DAILY Patient taking differently: Take 40 mg by mouth every evening.  03/21/19   Lelon Perla, MD    Family History    Family History   Problem Relation Age of Onset  . Heart disease Mother   . Heart disease Father    He indicated that his mother is deceased. He indicated that his father is deceased. He indicated that his maternal grandmother is deceased. He indicated that his maternal grandfather is deceased. He indicated that his paternal grandmother is deceased. He indicated that his paternal grandfather is deceased.  Social History    Social History   Socioeconomic History  . Marital status: Widowed    Spouse name: Not on file  . Number of children: Not on file  . Years of education: GED  . Highest education level: Not on file  Occupational History  . Occupation: retired  Tobacco Use  . Smoking status: Former Smoker    Packs/day: 3.00    Years: 38.00    Pack years: 114.00    Quit date: 04/10/1988    Years since quitting: 31.7  . Smokeless tobacco: Never Used  Vaping Use  . Vaping Use: Never used  Substance and Sexual Activity  . Alcohol use: Yes    Comment: 05/21/2017 "might drink 6 beers/year"  . Drug use: No  . Sexual activity: Not on file  Other Topics Concern  . Not on file  Social History Narrative  . Not on file   Social Determinants of Health   Financial Resource Strain:   . Difficulty of Paying Living Expenses: Not on file  Food Insecurity:   . Worried About Charity fundraiser in the Last Year: Not on file  . Ran Out of Food in the Last Year: Not on file  Transportation Needs:   . Lack of Transportation (Medical): Not on file  . Lack of Transportation (Non-Medical): Not on file  Physical Activity:   . Days of Exercise per Week: Not on file  . Minutes of Exercise per Session: Not on file  Stress:   . Feeling of Stress : Not on file  Social Connections:   . Frequency of Communication with Friends and Family: Not on file  . Frequency of Social Gatherings with Friends and Family: Not on file  . Attends Religious Services: Not on file  . Active Member of Clubs or Organizations: Not on  file  . Attends Archivist Meetings: Not on file  . Marital Status: Not on file  Intimate Partner Violence:   . Fear of Current or Ex-Partner: Not on file  . Emotionally Abused: Not on file  . Physically Abused: Not on file  . Sexually Abused: Not on file     Review of Systems    General:  No chills, fever, night sweats or weight changes.  Cardiovascular:  No chest pain, dyspnea on exertion, edema, orthopnea, palpitations, paroxysmal nocturnal dyspnea. Dermatological: No rash, lesions/masses Respiratory: No cough, dyspnea Urologic: No hematuria, dysuria Abdominal:   No nausea, vomiting, diarrhea, bright red blood per rectum, melena, or hematemesis Neurologic:  No visual changes, wkns, changes in mental status. All other systems reviewed and are otherwise negative except as noted above.  Physical Exam    VS:  BP 110/67   Pulse (!) 40   Temp 97.8 F (36.6 C)   Ht 5\' 8"  (1.727 m)   Wt 140 lb 6.4 oz (63.7 kg)   SpO2 94%   BMI 21.35 kg/m  , BMI Body mass index is 21.35 kg/m. GEN: Well nourished, well developed, in no acute distress. HEENT: normal. Neck: Supple, no JVD, carotid bruits, or masses. Cardiac: Complete heart block 40 bpm, no murmurs, rubs, or gallops. No clubbing, cyanosis, edema.  Radials/DP/PT 2+ and equal bilaterally.  Respiratory:  Respirations regular and unlabored, clear to auscultation bilaterally. GI: Soft, nontender, nondistended, BS + x 4. MS: no deformity or atrophy. Skin: warm and dry, no rash. Neuro:  Strength and sensation are intact. Psych: Normal affect.  Accessory Clinical Findings    Recent Labs: 11/06/2019: ALT 20 12/03/2019: BUN 41; Creat 1.79; Hemoglobin 11.7; Platelets 202; Potassium 5.0; Sodium 142   Recent Lipid Panel    Component Value Date/Time   CHOL 143 09/20/2018 1103   CHOL 151 06/29/2017 0000   TRIG 94.0 09/20/2018 1103   HDL 50.40 09/20/2018 1103   HDL 53 06/29/2017 0000   CHOLHDL 3 09/20/2018 1103   VLDL 18.8  09/20/2018 1103   LDLCALC 74 09/20/2018 1103   LDLCALC 82 06/29/2017 0000    ECG personally reviewed by me today-complete heart block 40 bpm- No acute changes  EKG 07/24/2019 normal sinus rhythm right bundle branch block 74 bpm- No acute changes  EKG 06/25/2018 Normal sinus rhythm right bundle branch block 68 bpm  Echocardiogram 07/28/2010 Study Conclusions   - Left ventricle: The cavity size was normal. Wall thickness was  normal. Systolic function was normal. The estimated ejection  fraction was in the range of 55% to 60%. Wall motion was normal;  there were no regional wall motion abnormalities. Doppler  parameters are consistent with abnormal left ventricular  relaxation (grade 1 diastolic dysfunction).  - Left atrium: The atrium was mildly dilated.  Nuclear stress test 06/27/2016  The left ventricular ejection fraction is normal (55-65%).  Nuclear stress EF: 56%.  Defect 1: There is a small defect of moderate severity present in the apex location. This defect is most consistent with apical thinning .  The study is normal.  This is a low risk study.  Echocardiogram 08/06/2019 IMPRESSIONS    1. Septal and inferior hypokinesis . Left ventricular ejection fraction,  by estimation, is 50 to 55%. The left ventricle has low normal function.  The left ventricle demonstrates regional wall motion abnormalities (see  scoring diagram/findings for  description). Left ventricular diastolic parameters were normal.  2. Right ventricular systolic function is normal. The right  ventricular  size is normal. There is normal pulmonary artery systolic pressure.  3. The mitral valve is degenerative. Trivial mitral valve regurgitation.  No evidence of mitral stenosis.  4. The aortic valve is tricuspid. Aortic valve regurgitation is not  visualized. Significant sclerosis with mean gradient 6 mmHg AVA >2 cm2.  5. The inferior vena cava is normal in size with greater than 50%   respiratory variability, suggesting right atrial pressure of 3 mmHg.  Assessment & Plan   1.  Bradycardia-EKG today shows complete heart block 40 bpm.  Currently not taking any AV nodal blocking agents.  Presented to PCP on 12/12/2019 with a heart rate of 38 blood pressure of 150/82.  He refused referral to ED for further evaluation.  Case discussed with DOD. Proceed for pacemaker implant-given his lack of symptoms at rest we plan for outpatient insertion.  Hope is to limit his risk of contracting COVID-19. Order BMP and CBC  Patient instructed not to drive.  Patient instructed to have 24/7 monitoring prior to being admitted/presented to the hospital for PPM insertion.  He and his daughter expressed understanding.  Coronary artery disease/DOE-no chest pain today.  Nuclear stress test 06/27/2016 showed an EF of 56%, small defect consistent with apical thinning, normal study low risk.Has noticed increased DOE with mowing and physical activity.  Continue aspirin Continue statin Continue nitroglycerin 0.4 mg sublingual as needed Continue ramipril 10 mg daily   Hyperlipidemia-09/20/2018: Cholesterol 143; HDL 50.40; LDL Cholesterol 74; Triglycerides 94.0; VLDL 18.8 Continue rosuvastatin Heart healthy low-sodium high-fiber diet  Essential hypertension-BP today 110/67.  Well-controlled at home Continue ramipril 10 mg daily Heart healthy low-sodium diet  Abdominal aortic aneurysm-on last evaluation 2.9 cm 7/21 previous diameter 7.2 cm on 7/20. Repeat abdominal ultrasound 7/22  Peripheral vascular disease-no complaints of lower extremity claudication. Continue to monitor.  Disposition: Follow-up with Dr. Stanford Breed in 1 month/post PPM insertion.   Jossie Ng. Shakeria Robinette NP-C    12/16/2019, 3:24 PM [facility] [place] [address] Office [phone] Fax [phone]  Notice: This dictation was prepared with Dragon dictation along with smaller phrase  technology. Any transcriptional errors that result from this process are unintentional and may not be corrected upon review.

## 2019-12-16 NOTE — Progress Notes (Signed)
I have seen and examined the patient along with Jess Cleaver,FNP.  I have reviewed the chart, notes and new data.  I agree with PA/NP's note.  Key new complaints: he is tolerating the arrhythmia fairly well and has not experienced syncope. Does have fatigue. Symptoms ongoing for at least 2 weeks. Key examination changes: elderly, but does not appear frail, marked bradycardia, widely split S2, early peaking systolic ejection murmur Key new findings / data: ECG with sinus rhythm with complete AV block and idioventricular escape rhythm with RBBB morphology. No serious structural pathology on echo and nuclear stress test 2012.  PLAN: Symptomatic bradycardia due to complete heart block, without use of medications with negative chronotropic effect.  He needs a dual chamber permanent pacemaker. Appears hemodynamically stable. Wants to minimize exposure to possible coronavirus sources. Discussed PPM implantation, including possible complications (lead dislodgement, infection, perforation, pneumothorax, reoperation, chest tube , etc.), device longevity and follow up. This procedure has been fully reviewed with the patient  (and his daughter) and written informed consent has been obtained. Will get preliminary labs, including COVID pretesting today. Anticipate PPM tomorrow at  roughly 1430h with Dr. Quentin Ore or Dr. Lovena Le. Consider limited echo for LV function on arrival, before the procedure. Appreciate help from [place], [place] to schedule the procedure.  Sanda Klein, MD, [place] [phone] 12/16/2019, 5:26 PM

## 2019-12-16 NOTE — Telephone Encounter (Signed)
    Pt and his daughter called, they went to [facility] covid testing center when they went clinic is already close. Secured chat Rennis Harding and was advised to tell pt to come in at the hospital [place] at 11:30 am to do covid test there. Provide info to the daughter and they understood.

## 2019-12-16 NOTE — Patient Instructions (Addendum)
Medication Instructions:  No Changes In Medications at this time.  *If you need a refill on your cardiac medications before your next appointment, please call your pharmacy*   Lab Work: BMET and CBC today.  If you have labs (blood work) drawn today and your tests are completely normal, you will receive your results only by: Marland Kitchen MyChart Message (if you have MyChart) OR . A paper copy in the mail If you have any lab test that is abnormal or we need to change your treatment, we will call you to review the results.   Testing/Procedures: Permanent Pacemaker Placement- this will be done outpatient. Someone will reach out to you to schedule and set this up. PLEASE NO EXERCISE, or STRENUOUS ACTIVITY, NO DRIVING.   Follow-Up: At [facility], you and your health needs are our priority.  As part of our continuing mission to provide you with exceptional heart care, we have created designated Provider Care Teams.  These Care Teams include your primary Cardiologist (physician) and Advanced Practice Providers (APPs -  Physician Assistants and Nurse Practitioners) who all work together to provide you with the care you need, when you need it.  We recommend signing up for the patient portal called "MyChart".  Sign up information is provided on this After Visit Summary.  MyChart is used to connect with patients for Virtual Visits (Telemedicine).  Patients are able to view lab/test results, encounter notes, upcoming appointments, etc.  Non-urgent messages can be sent to your provider as well.   To learn more about what you can do with MyChart, go to [email or web address].    Your next appointment:   FOLLOW UP AFTER PACEMAKER PLACEMENT   The format for your next appointment:   In Person  Provider:   You may see [name], MD or one of the following Advanced Practice Providers on your designated Care Team:    Kerin Ransom, PA-C  [place], [place]  Coletta Memos, [place]    Other  Instructions NO EXERCISE, or STRENUOUS ACTIVITY or DRIVING    Any Other Special Instructions Will Be Listed Below (If Applicable).     Implantable Device Instructions  You are scheduled for Permanent Transvenous Pacemaker  1.  We will call you with a date and time.   2. Do not eat or drink after midnight the night before your procedure.  3.   Your provider would like for you to have your labs today: CBC and BMET  4.  Medications: Nothing to hold.  5.  Plan for an overnight stay.  Bring your insurance cards and a list of you medications.  6.  Wash your chest and neck with surgical scrub the evening before and the morning of your procedure.  Rinse well. Please review the surgical scrub instruction sheet given to you. You may pick this up at the [facility] office or any drug store.  7. Your chest will need to be shaved prior to this procedure (if needed). We ask that you do this yourself at home 1 to 2 days before or if uncomfortable/unable to do yourself, then it will be performed by the hospital staff the day of.                                                       * Every attempt is made to  prevent procedures from being rescheduled.  Due to the nature of  Electrophysiology, rescheduling can happen.  The physician is always aware and directs the staff when this occurs.     Pacemaker Implantation, Adult Pacemaker implantation is a procedure to place a pacemaker inside your chest. A pacemaker is a small computer that sends electrical signals to the heart and helps your heart beat normally. A pacemaker also stores information about your heart rhythms. You may need pacemaker implantation if you:  Have a slow heartbeat (bradycardia).  Faint (syncope).  Have shortness of breath (dyspnea) due to heart problems.  The pacemaker attaches to your heart through a wire, called a lead. Sometimes just one lead is needed. Other times, there will be two leads. There are two types of  pacemakers:  Transvenous pacemaker. This type is placed under the skin or muscle of your chest. The lead goes through a vein in the chest area to reach the inside of the heart.  Epicardial pacemaker. This type is placed under the skin or muscle of your chest or belly. The lead goes through your chest to the outside of the heart.  Tell a health care provider about:  Any allergies you have.  All medicines you are taking, including vitamins, herbs, eye drops, creams, and over-the-counter medicines.  Any problems you or family members have had with anesthetic medicines.  Any blood or bone disorders you have.  Any surgeries you have had.  Any medical conditions you have.  Whether you are pregnant or may be pregnant. What are the risks? Generally, this is a safe procedure. However, problems may occur, including:  Infection.  Bleeding.  Failure of the pacemaker or the lead.  Collapse of a lung or bleeding into a lung.  Blood clot inside a blood vessel with a lead.  Damage to the heart.  Infection inside the heart (endocarditis).  Allergic reactions to medicines.  What happens before the procedure? Staying hydrated Follow instructions from your health care provider about hydration, which may include:  Up to 2 hours before the procedure - you may continue to drink clear liquids, such as water, clear fruit juice, black coffee, and plain tea.  Eating and drinking restrictions Follow instructions from your health care provider about eating and drinking, which may include:  8 hours before the procedure - stop eating heavy meals or foods such as meat, fried foods, or fatty foods.  6 hours before the procedure - stop eating light meals or foods, such as toast or cereal.  6 hours before the procedure - stop drinking milk or drinks that contain milk.  2 hours before the procedure - stop drinking clear liquids.  Medicines  Ask your health care provider about: ? Changing or  stopping your regular medicines. This is especially important if you are taking diabetes medicines or blood thinners. ? Taking medicines such as aspirin and ibuprofen. These medicines can thin your blood. Do not take these medicines before your procedure if your health care provider instructs you not to.  You may be given antibiotic medicine to help prevent infection. General instructions  You will have a heart evaluation. This may include an electrocardiogram (ECG), chest X-ray, and heart imaging (echocardiogram,  or echo) tests.  You will have blood tests.  Do not use any products that contain nicotine or tobacco, such as cigarettes and e-cigarettes. If you need help quitting, ask your health care provider.  Plan to have someone take you home from the hospital or clinic.  If you will be going home right after the procedure, plan to have someone with you for 24 hours.  Ask your health care provider how your surgical site will be marked or identified. What happens during the procedure?  To reduce your risk of infection: ? Your health care team will wash or sanitize their hands. ? Your skin will be washed with soap. ? Hair may be removed from the surgical area.  An IV tube will be inserted into one of your veins.  You will be given one or more of the following: ? A medicine to help you relax (sedative). ? A medicine to numb the area (local anesthetic). ? A medicine to make you fall asleep (general anesthetic).  If you are getting a transvenous pacemaker: ? An incision will be made in your upper chest. ? A pocket will be made for the pacemaker. It may be placed under the skin or between layers of muscle. ? The lead will be inserted into a blood vessel that returns to the heart. ? While X-rays are taken by an imaging machine (fluoroscopy), the lead will be advanced through the vein to the inside of your heart. ? The other end of the lead will be tunneled under the skin and attached  to the pacemaker.  If you are getting an epicardial pacemaker: ? An incision will be made near your ribs or breastbone (sternum) for the lead. ? The lead will be attached to the outside of your heart. ? Another incision will be made in your chest or upper belly to create a pocket for the pacemaker. ? The free end of the lead will be tunneled under the skin and attached to the pacemaker.  The transvenous or epicardial pacemaker will be tested. Imaging studies may be done to check the lead position.  The incisions will be closed with stitches (sutures), adhesive strips, or skin glue.  Bandages (dressing) will be placed over the incisions. The procedure may vary among health care providers and hospitals. What happens after the procedure?  Your blood pressure, heart rate, breathing rate, and blood oxygen level will be monitored until the medicines you were given have worn off.  You will be given antibiotics and pain medicine.  ECG and chest x-rays will be done.  You will wear a continuous type of ECG (Holter monitor) to check your heart rhythm.  Your health care provider will program the pacemaker.  Do not drive for 24 hours if you received a sedative. This information is not intended to replace advice given to you by your health care provider. Make sure you discuss any questions you have with your health care provider. Document Released: 03/17/2002 Document Revised: 10/15/2015 Document Reviewed: 09/08/2015 Elsevier Interactive Patient Education  2018 [employer].     Pacemaker Implantation, Adult, Care After This sheet gives you information about how to care for yourself after your procedure. Your health care provider may also give you more specific instructions. If you have problems or questions, contact your health care provider. What can I expect after the procedure? After the procedure, it is common to have:  Mild pain.  Slight bruising.  Some swelling over the  incision.  A slight bump over the skin where the device was placed. Sometimes, it is possible to feel the device under the skin. This is normal.  Follow these instructions at home: Medicines  Take over-the-counter and prescription medicines only as told by your health care provider.  If you were prescribed an  antibiotic medicine, take it as told by your health care provider. Do not stop taking the antibiotic even if you start to feel better. Wound care  Do not remove the bandage on your chest until directed to do so by your health care provider.  After your bandage is removed, you may see pieces of tape called skin adhesive strips over the area where the cut was made (incision site). Let them fall off on their own.  Check the incision site every day to make sure it is not infected, bleeding, or starting to pull apart.  Do not use lotions or ointments near the incision site unless directed to do so.  Keep the incision area clean and dry for 2-3 days after the procedure or as directed by your health care provider. It takes several weeks for the incision site to completely heal.  Do not take baths, swim, or use a hot tub for 7-10 days or as otherwise directed by your health care provider. Activity  Do not drive or use heavy machinery while taking prescription pain medicine.  Do not drive for 24 hours if you were given a medicine to help you relax (sedative).  Check with your health care provider before you start to drive or play sports.  Avoid sudden jerking, pulling, or chopping movements that pull your upper arm far away from your body. Avoid these movements for at least 6 weeks or as long as told by your health care provider.  Do not lift your upper arm above your shoulders for at least 6 weeks or as long as told by your health care provider. This means no tennis, golf, or swimming.  You may go back to work when your health care provider says it is okay. Pacemaker care  You may  be shown how to transfer data from your pacemaker through the phone to your health care provider.  Always let all health care providers know about your pacemaker before you have any medical procedures or tests.  Wear a medical ID bracelet or necklace stating that you have a pacemaker. Carry a pacemaker ID card with you at all times.  Your pacemaker battery will last for 5-15 years. Routine checks by your health care provider will let the health care provider know when the battery is starting to run down. The pacemaker will need to be replaced when the battery starts to run down.  Do not use amateur Chief of Staff. Other electrical devices are safe to use, including power tools, lawn mowers, and speakers. If you are unsure of whether something is safe to use, ask your health care provider.  When using your cell phone, hold it to the ear opposite the pacemaker. Do not leave your cell phone in a pocket over the pacemaker.  Avoid places or objects that have a strong electric or magnetic field, including: ? Airport Herbalist. When at the airport, let officials know that you have a pacemaker. ? Power plants. ? Large electrical generators. ? Radiofrequency transmission towers, such as cell phone and radio towers. General instructions  Weigh yourself every day. If you suddenly gain weight, fluid may be building up in your body.  Keep all follow-up visits as told by your health care provider. This is important. Contact a health care provider if:  You gain weight suddenly.  Your legs or feet swell.  It feels like your heart is fluttering or skipping beats (heart palpitations).  You have chills or a fever.  You have more redness, swelling, or pain around your incisions.  You have more fluid or blood coming from your incisions.  Your incisions feel warm to the touch.  You have pus or a bad smell coming from your incisions. Get help right away if:  You  have chest pain.  You have trouble breathing or are short of breath.  You become extremely tired.  You are light-headed or you faint. This information is not intended to replace advice given to you by your health care provider. Make sure you discuss any questions you have with your health care provider. Document Released: 10/14/2004 Document Revised: 01/07/2016 Document Reviewed: 01/07/2016 Elsevier Interactive Patient Education  2018 [place] Discharge Instructions for  Pacemaker/Defibrillator Patients  ACTIVITY No heavy lifting or vigorous activity with your left/right arm for 6 to 8 weeks.  Do not raise your left/right arm above your head for one week.  Gradually raise your affected arm as drawn below.           __  NO DRIVING for     ; you may begin driving on     .  WOUND CARE - Keep the wound area clean and dry.  Do not get this area wet for one week. No showers for one week; you may shower on     . - The tape/steri-strips on your wound will fall off; do not pull them off.  No bandage is needed on the site.  DO  NOT apply any creams, oils, or ointments to the wound area. - If you notice any drainage or discharge from the wound, any swelling or bruising at the site, or you develop a fever > 101? F after you are discharged home, call the office at once.  SPECIAL INSTRUCTIONS - You are still able to use cellular telephones; use the ear opposite the side where you have your pacemaker/defibrillator.  Avoid carrying your cellular phone near your device. - When traveling through airports, show security personnel your identification card to avoid being screened in the metal detectors.  Ask the security personnel to use the hand wand. - Avoid arc welding equipment, MRI testing (magnetic resonance imaging), TENS units (transcutaneous nerve stimulators).  Call the office for questions about other devices. - Avoid electrical appliances that are in poor condition or  are not properly grounded. - Microwave ovens are safe to be near or to operate.  ADDITIONAL INFORMATION FOR DEFIBRILLATOR PATIENTS SHOULD YOUR DEVICE GO OFF: - If your device goes off ONCE and you feel fine afterward, notify the device clinic nurses. - If your device goes off ONCE and you do not feel well afterward, call 911. - If your device goes off TWICE, call 911. - If your device goes off [place], call 911.  DO NOT DRIVE YOURSELF OR A FAMILY MEMBER WITH A DEFIBRILLATOR TO THE HOSPITAL--CALL 911.    [place] - Preparing For Surgery  Before surgery, you can play an important role. Because skin is not sterile, your skin needs to be as free of germs as possible. You can reduce the number of germs on your skin by washing with CHG (chlorahexidine gluconate) Soap before surgery.  CHG is an antiseptic cleaner which kills germs and bonds with the skin to continue killing germs even after washing.   Please do not use if you have an allergy to CHG or antibacterial soaps.  If your skin becomes reddened/irritated stop using the CHG.  Do not shave (including legs and underarms) for at least 48 hours prior to first CHG shower.  It is OK to shave your face.  Please follow these instructions carefully:  1.  Shower the night before surgery and the morning of surgery with CHG.  2.  If you choose to wash your hair, wash your hair first as usual with your normal shampoo.  3.  After you shampoo, rinse your hair and body thoroughly to remove the shampoo.  4.  Use CHG as you would any other liquid soap.  You can apply CHG directly to the skin and wash gently with a clean washcloth. 5.  Apply the CHG Soap to your body ONLY FROM THE NECK DOWN.  Do not use on open wounds or open sores.  Avoid contact with your eyes, ears, mouth and genitals (private parts).  Wash genitals (private parts) with your normal soap.  6.  Wash thoroughly, paying special attention to the area where your surgery will  be performed.  7.  Thoroughly rise your body with warm water from the neck down.   8.  DO NOT shower/wash with your normal soap after using and rinsing off the CHG soap.  9.  Pat yourself dry with a clean towel.           10.  Wear clean pajamas.           11.  Place clean sheets on your bed the night of your first shower and do not sleep with pets.  Day of Surgery: Do not apply any deodorants/lotions.  Please wear clean clothes to the hospital/surgery center.

## 2019-12-17 ENCOUNTER — Encounter: Payer: Self-pay | Admitting: Physician Assistant

## 2019-12-17 ENCOUNTER — Other Ambulatory Visit: Payer: Self-pay

## 2019-12-17 ENCOUNTER — Ambulatory Visit (HOSPITAL_BASED_OUTPATIENT_CLINIC_OR_DEPARTMENT_OTHER): Payer: Medicare PPO

## 2019-12-17 ENCOUNTER — Ambulatory Visit (HOSPITAL_COMMUNITY): Payer: Medicare PPO

## 2019-12-17 ENCOUNTER — Observation Stay (HOSPITAL_COMMUNITY)
Admission: RE | Admit: 2019-12-17 | Discharge: 2019-12-18 | Disposition: A | Discharge status: Home / Self Care | Payer: Medicare PPO | Attending: Cardiology | Admitting: Cardiology

## 2019-12-17 ENCOUNTER — Encounter (HOSPITAL_COMMUNITY)
Admission: RE | Disposition: A | Discharge status: Home / Self Care | Payer: Self-pay | Source: Home / Self Care | Attending: Cardiology

## 2019-12-17 DIAGNOSIS — Z7982 Long term (current) use of aspirin: Secondary | ICD-10-CM | POA: Diagnosis not present

## 2019-12-17 DIAGNOSIS — I340 Nonrheumatic mitral (valve) insufficiency: Secondary | ICD-10-CM

## 2019-12-17 DIAGNOSIS — J449 Chronic obstructive pulmonary disease, unspecified: Secondary | ICD-10-CM | POA: Diagnosis not present

## 2019-12-17 DIAGNOSIS — Z20822 Contact with and (suspected) exposure to covid-19: Secondary | ICD-10-CM | POA: Diagnosis not present

## 2019-12-17 DIAGNOSIS — I2510 Atherosclerotic heart disease of native coronary artery without angina pectoris: Secondary | ICD-10-CM | POA: Diagnosis not present

## 2019-12-17 DIAGNOSIS — I350 Nonrheumatic aortic (valve) stenosis: Secondary | ICD-10-CM

## 2019-12-17 DIAGNOSIS — I252 Old myocardial infarction: Secondary | ICD-10-CM | POA: Diagnosis not present

## 2019-12-17 DIAGNOSIS — I10 Essential (primary) hypertension: Secondary | ICD-10-CM | POA: Diagnosis not present

## 2019-12-17 DIAGNOSIS — Z79899 Other long term (current) drug therapy: Secondary | ICD-10-CM | POA: Insufficient documentation

## 2019-12-17 DIAGNOSIS — Z955 Presence of coronary angioplasty implant and graft: Secondary | ICD-10-CM | POA: Diagnosis not present

## 2019-12-17 DIAGNOSIS — Z87891 Personal history of nicotine dependence: Secondary | ICD-10-CM | POA: Diagnosis not present

## 2019-12-17 DIAGNOSIS — Z950 Presence of cardiac pacemaker: Secondary | ICD-10-CM | POA: Diagnosis present

## 2019-12-17 DIAGNOSIS — I442 Atrioventricular block, complete: Secondary | ICD-10-CM

## 2019-12-17 HISTORY — PX: PACEMAKER IMPLANT: EP1218

## 2019-12-17 LAB — BASIC METABOLIC PANEL
Anion gap: 10 (ref 5–15)
Anion gap: 10 (ref 5–15)
BUN/Creatinine Ratio: 20 (ref 10–24)
BUN: 31 mg/dL — ABNORMAL HIGH (ref 8–27)
BUN: 33 mg/dL — ABNORMAL HIGH (ref 8–23)
BUN: 32 mg/dL — ABNORMAL HIGH (ref 8–23)
CO2: 20 mmol/L (ref 20–29)
CO2: 22 mmol/L (ref 22–32)
CO2: 22 mmol/L (ref 22–32)
Calcium: 9.5 mg/dL (ref 8.6–10.2)
Calcium: 9.1 mg/dL (ref 8.9–10.3)
Calcium: 9 mg/dL (ref 8.9–10.3)
Chloride: 110 mmol/L — ABNORMAL HIGH (ref 96–106)
Chloride: 110 mmol/L (ref 98–111)
Chloride: 110 mmol/L (ref 98–111)
Creatinine, Ser: 1.55 mg/dL — ABNORMAL HIGH (ref 0.76–1.27)
Creatinine, Ser: 1.72 mg/dL — ABNORMAL HIGH (ref 0.61–1.24)
Creatinine, Ser: 1.64 mg/dL — ABNORMAL HIGH (ref 0.61–1.24)
GFR calc Af Amer: 46 mL/min/{1.73_m2} — ABNORMAL LOW (ref >59)
GFR calc Af Amer: 41 mL/min — ABNORMAL LOW (ref >60)
GFR calc Af Amer: 43 mL/min — ABNORMAL LOW (ref >60)
GFR calc non Af Amer: 40 mL/min/{1.73_m2} — ABNORMAL LOW (ref >59)
GFR calc non Af Amer: 35 mL/min — ABNORMAL LOW (ref >60)
GFR calc non Af Amer: 37 mL/min — ABNORMAL LOW (ref >60)
Glucose, Bld: 111 mg/dL — ABNORMAL HIGH (ref 70–99)
Glucose, Bld: 104 mg/dL — ABNORMAL HIGH (ref 70–99)
Glucose: 108 mg/dL — ABNORMAL HIGH (ref 65–99)
Potassium: 6.5 mmol/L (ref 3.5–5.2)
Potassium: 5.1 mmol/L (ref 3.5–5.1)
Potassium: 4.3 mmol/L (ref 3.5–5.1)
Sodium: 144 mmol/L (ref 134–144)
Sodium: 142 mmol/L (ref 135–145)
Sodium: 142 mmol/L (ref 135–145)

## 2019-12-17 LAB — CBC WITH DIFFERENTIAL/PLATELET
Basophils Absolute: 0.1 10*3/uL (ref 0.0–0.2)
Basos: 1 %
EOS (ABSOLUTE): 0.2 10*3/uL (ref 0.0–0.4)
Eos: 2 %
Hematocrit: 37.7 % (ref 37.5–51.0)
Hemoglobin: 12.1 g/dL — ABNORMAL LOW (ref 13.0–17.7)
Immature Grans (Abs): 0 10*3/uL (ref 0.0–0.1)
Immature Granulocytes: 0 %
Lymphocytes Absolute: 2.3 10*3/uL (ref 0.7–3.1)
Lymphs: 24 %
MCH: 30.6 pg (ref 26.6–33.0)
MCHC: 32.1 g/dL (ref 31.5–35.7)
MCV: 95 fL (ref 79–97)
Monocytes Absolute: 0.7 10*3/uL (ref 0.1–0.9)
Monocytes: 8 %
Neutrophils Absolute: 6.3 10*3/uL (ref 1.4–7.0)
Neutrophils: 65 %
Platelets: 203 10*3/uL (ref 150–450)
RBC: 3.96 x10E6/uL — ABNORMAL LOW (ref 4.14–5.80)
RDW: 14.3 % (ref 11.6–15.4)
WBC: 9.7 10*3/uL (ref 3.4–10.8)

## 2019-12-17 LAB — ECHOCARDIOGRAM LIMITED
AR max vel: 1.65 cm2
AV Area VTI: 1.97 cm2
AV Area mean vel: 2.03 cm2
AV Mean grad: 7.5 mmHg
AV Peak grad: 17.9 mmHg
Ao pk vel: 2.12 m/s
Area-P 1/2: 6.17 cm2
Height: 68 in
S' Lateral: 3.4 cm
Weight: 2240 oz

## 2019-12-17 LAB — SARS CORONAVIRUS 2 BY RT PCR (HOSPITAL ORDER, PERFORMED IN ~~LOC~~ HOSPITAL LAB): SARS Coronavirus 2: NEGATIVE

## 2019-12-17 SURGERY — PACEMAKER IMPLANT

## 2019-12-17 MED ORDER — SODIUM CHLORIDE 0.9% FLUSH: 3.0000 mL | INTRAVENOUS | Status: DC | PRN

## 2019-12-17 MED ORDER — MIDAZOLAM HCL 5 MG/5ML IJ SOLN
INTRAMUSCULAR | Status: DC | PRN
  Administered 2019-12-17: 0.5 mg via INTRAVENOUS
  Administered 2019-12-17: 1 mg via INTRAVENOUS
  Administered 2019-12-17: 0.5 mg via INTRAVENOUS

## 2019-12-17 MED ORDER — SODIUM CHLORIDE 0.9 % IV SOLN: INTRAVENOUS | Status: DC

## 2019-12-17 MED ORDER — ALUM & MAG HYDROXIDE-SIMETH 200-200-20 MG/5ML PO SUSP: 30.0000 mL | ORAL | Status: DC | PRN

## 2019-12-17 MED ORDER — SODIUM CHLORIDE 0.9 % IV SOLN
INTRAVENOUS | Status: AC
  Filled 2019-12-17: qty 2

## 2019-12-17 MED ORDER — CEFAZOLIN SODIUM-DEXTROSE 2-4 GM/100ML-% IV SOLN
INTRAVENOUS | Status: AC
  Filled 2019-12-17: qty 100

## 2019-12-17 MED ORDER — ACETAMINOPHEN 325 MG PO TABS
325.0000 mg | ORAL_TABLET | ORAL | Status: DC | PRN
  Filled 2019-12-17: qty 2

## 2019-12-17 MED ORDER — SODIUM CHLORIDE 0.9% FLUSH
3.0000 mL | Freq: Two times a day (BID) | INTRAVENOUS | Status: DC
  Administered 2019-12-17 – 2019-12-18 (×2): 3 mL via INTRAVENOUS

## 2019-12-17 MED ORDER — LIDOCAINE HCL (PF) 1 % IJ SOLN
INTRAMUSCULAR | Status: DC | PRN
  Administered 2019-12-17: 60 mL

## 2019-12-17 MED ORDER — HEPARIN (PORCINE) IN NACL 1000-0.9 UT/500ML-% IV SOLN
INTRAVENOUS | Status: DC | PRN
  Administered 2019-12-17: 500 mL

## 2019-12-17 MED ORDER — DICLOFENAC SODIUM 1 % TD GEL
2.0000 g | Freq: Four times a day (QID) | TRANSDERMAL | Status: DC | PRN
  Filled 2019-12-17: qty 100

## 2019-12-17 MED ORDER — SODIUM CHLORIDE 0.9 % IV SOLN: 250.0000 mL | INTRAVENOUS | Status: DC

## 2019-12-17 MED ORDER — ROSUVASTATIN CALCIUM 20 MG PO TABS
40.0000 mg | ORAL_TABLET | Freq: Every evening | ORAL | Status: DC
  Administered 2019-12-17: 40 mg via ORAL
  Filled 2019-12-17: qty 2

## 2019-12-17 MED ORDER — HYDRALAZINE HCL 25 MG PO TABS
25.0000 mg | ORAL_TABLET | Freq: Once | ORAL | Status: AC
  Administered 2019-12-17: 25 mg via ORAL
  Filled 2019-12-17: qty 1

## 2019-12-17 MED ORDER — PANTOPRAZOLE SODIUM 40 MG PO TBEC
40.0000 mg | DELAYED_RELEASE_TABLET | Freq: Every day | ORAL | Status: DC
  Administered 2019-12-17: 40 mg via ORAL
  Filled 2019-12-17: qty 1

## 2019-12-17 MED ORDER — MIDAZOLAM HCL 5 MG/5ML IJ SOLN
INTRAMUSCULAR | Status: AC
  Filled 2019-12-17: qty 5

## 2019-12-17 MED ORDER — FENTANYL CITRATE (PF) 100 MCG/2ML IJ SOLN
INTRAMUSCULAR | Status: AC
  Filled 2019-12-17: qty 2

## 2019-12-17 MED ORDER — CEFAZOLIN SODIUM-DEXTROSE 2-4 GM/100ML-% IV SOLN
2.0000 g | INTRAVENOUS | Status: AC
  Administered 2019-12-17: 2 g via INTRAVENOUS
  Filled 2019-12-17: qty 100

## 2019-12-17 MED ORDER — HYDROCODONE-ACETAMINOPHEN 5-325 MG PO TABS
1.0000 | ORAL_TABLET | Freq: Once | ORAL | Status: AC
  Administered 2019-12-17: 1 via ORAL
  Filled 2019-12-17: qty 1

## 2019-12-17 MED ORDER — DOCUSATE CALCIUM 240 MG PO CAPS: 240.0000 mg | ORAL_CAPSULE | Freq: Two times a day (BID) | ORAL | Status: DC | PRN

## 2019-12-17 MED ORDER — ONDANSETRON HCL 4 MG/2ML IJ SOLN: 4.0000 mg | Freq: Four times a day (QID) | INTRAMUSCULAR | Status: DC | PRN

## 2019-12-17 MED ORDER — FENTANYL CITRATE (PF) 100 MCG/2ML IJ SOLN
INTRAMUSCULAR | Status: DC | PRN
Start: 2019-12-17 — End: 2019-12-17
  Administered 2019-12-17 (×2): 25 ug via INTRAVENOUS

## 2019-12-17 MED ORDER — CYCLOBENZAPRINE HCL 5 MG PO TABS: 5.0000 mg | ORAL_TABLET | Freq: Two times a day (BID) | ORAL | Status: DC | PRN

## 2019-12-17 MED ORDER — SODIUM CHLORIDE 0.9 % IV SOLN
80.0000 mg | INTRAVENOUS | Status: AC
  Administered 2019-12-17: 80 mg
  Filled 2019-12-17: qty 2

## 2019-12-17 MED ORDER — SIMETHICONE 80 MG PO CHEW
80.0000 mg | CHEWABLE_TABLET | Freq: Once | ORAL | Status: AC
  Administered 2019-12-17: 80 mg via ORAL
  Filled 2019-12-17: qty 1

## 2019-12-17 MED ORDER — HYDROCODONE-ACETAMINOPHEN 5-325 MG PO TABS
1.0000 | ORAL_TABLET | Freq: Four times a day (QID) | ORAL | Status: DC | PRN
  Administered 2019-12-18: 1 via ORAL
  Filled 2019-12-17: qty 1

## 2019-12-17 MED ORDER — DOCUSATE SODIUM 100 MG PO CAPS: 100.0000 mg | ORAL_CAPSULE | Freq: Two times a day (BID) | ORAL | Status: DC | PRN

## 2019-12-17 SURGICAL SUPPLY — 9 items
CABLE SURGICAL S-101-97-12 (CABLE) ×3 IMPLANT
LEAD INGEVITY 7841 52 (Lead) ×1 IMPLANT
LEAD INGEVITY 7842 59 (Lead) ×1 IMPLANT
PACEMAKER ACCOLADE DR-EL (Pacemaker) ×1 IMPLANT
PAD PRO RADIOLUCENT 2001M-C (PAD) ×2 IMPLANT
SHEATH 7FR PRELUDE SNAP 13 (SHEATH) ×2 IMPLANT
SHEATH PROBE COVER 6X72 (BAG) ×1 IMPLANT
TRAY PACEMAKER INSERTION (PACKS) ×2 IMPLANT
WIRE HI TORQ VERSACORE-J 145CM (WIRE) ×1 IMPLANT

## 2019-12-17 NOTE — Progress Notes (Signed)
Client states "I have had two heart attacks and my chest never hurt this bad"

## 2019-12-17 NOTE — Progress Notes (Signed)
Received sign out from fellow that he received lab call that potassium was 6.5. Per his information the patient had declined to go to ED. He was due to come to short stay at 11:30 this AM. I discussed early this AM with EP MD Dr. Quentin Ore given potential implications with bradycardia who recommended the patient come in early for repeat labs. Tommye Standard PA-C also messaged procedure coordinator Rennis Harding about need for echocardiogram as well upon arrival. Anderson Malta spoke with patient and relayed need to come in early and the patient verbalized understanding. Araminta Zorn PA-C

## 2019-12-17 NOTE — Progress Notes (Addendum)
Pt arrived on the unit at 3E29 form short stay. Pacemaker dressing on arrival has scant amount of blood on the lower left area, per shortstay RN, MD is aware. Informed pt not to turn on affected side overnight, minimize moving left arm. Pt verbalized understanding. VS taken. Placed on cardiac monitoring. Pt resting comfortably in bed. Call bell within reach. Will continue to monitor.  Pt felt short of breath when he stood up at the side of the bed,  SpO2 88% on room air.  Pt placed on 2L Nasal cannula SpO2 96%.

## 2019-12-17 NOTE — Progress Notes (Signed)
Client tearful and c/o chest pressure states 10/10;  12 lead EKG done, asked client if he is short of breath and he states "no more that usual" O2 is on at 2l/min via nasal cannula because O2 sat was 88-90 on room air; orders noted from Dr Quentin Ore

## 2019-12-17 NOTE — Progress Notes (Signed)
Dr Quentin Ore in and ECHO done by Dr Quentin Ore; CXR done and Dr Quentin Ore saw CXR; order noted

## 2019-12-17 NOTE — H&P (Signed)
Cardiology Admission History and Physical:   Patient ID: Kristopher [name] MRN: [id number]; DOB: [birth year]   Admission date: 12/17/2019  Primary Care Provider: [name], MD [facility] Cardiologist: [name], MD    Chief Complaint:  PPM, post op GI upset and pain   History of Present Illness:   Kristopher [name] is a 86 y.o. male with complete heart block, large hiatal hernia, HTN, HLD, COPD who underwent PPM this afternoon. Procedure was uncomplicated but afterwards, patient with epigastric/low chest pain and nearly incessant belching. Pain is severe and only temporarily relieved by belching. There is no positional component to his pain and it is not related to deep breathing (pleuritic). Post operative chest x ray shows stable lead position and no pneumothorax. It does show large hiatal hernia that covers most of the cardiac silhouette.   Past Medical History:  Diagnosis Date  . AAA (abdominal aortic aneurysm) ([place])    ultrasound 07/2011:  2.7 x 2.8 cm  . Acute myocardial infarction, unspecified site, episode of care unspecified 1998  . Aortic stenosis    echo 4/12: normal LVF, mild LAE, no significant AS  . Arthritis    "all over" (05/21/2017)  . CAD (coronary artery disease)    s/p PCI of LAD 1998;  s/p DES to RCA 05/2009;  Myoview 4/13: low risk, no ischemia, EF 56%  . CAP (community acquired pneumonia) 05/21/2017  . COPD (chronic obstructive pulmonary disease) ([place])   . DJD (degenerative joint disease), lumbar 07/06/2011  . Esophageal reflux   . HLD (hyperlipidemia)   . HTN (hypertension)   . PAD (peripheral artery disease) ([place])    s/p L CIA stent 09/2011 ([place])  . Pneumonia    "I've always had pneumonia; since I was 41 months old" (05/21/2017)  . Renal artery stenosis ([facility])    s/p bilat RA stents  . Urine incontinence     Past Surgical History:  Procedure Laterality Date  . CATARACT EXTRACTION W/ INTRAOCULAR LENS  IMPLANT, BILATERAL Bilateral YRS AGO  .  CORONARY ANGIOPLASTY WITH STENT PLACEMENT  05/1996   PCI of LAD; "I've got a total of 4 stents" (05/21/2017)  . [place]  . LOWER EXTREMITY ANGIOGRAM Bilateral 09/20/2011   Procedure: LOWER EXTREMITY ANGIOGRAM;  Surgeon: Wellington Hampshire, MD;  Location: [facility];  Service: Cardiovascular;  Laterality: Bilateral;  . TRANSURETHRAL RESECTION OF PROSTATE  08/11/2011   Procedure: TRANSURETHRAL RESECTION OF THE PROSTATE WITH GYRUS INSTRUMENTS;  Surgeon: Fredricka Bonine, MD;  Location: [facility];  Service: Urology;  Laterality: N/A;     . VASECTOMY  1968     Medications Prior to Admission: Prior to Admission medications   Medication Sig Start Date End Date Taking? Authorizing Provider  Ascorbic Acid (VITAMIN C) 500 MG tablet Take 500 mg by mouth 2 (two) times daily.    Yes [provider]  aspirin 81 MG tablet Take 1 tablet (81 mg total) by mouth daily. 06/25/18  Yes Lelon Perla, MD  BEE POLLEN PO Take 1 capsule by mouth 2 (two) times daily.    Yes [provider]  Co-Enzyme Q-10 100 MG CAPS Take 100 mg by mouth every evening.    Yes [provider]  diclofenac sodium (VOLTAREN) 1 % GEL APPLY 2 GRAMS TOPICALLY FOUR TIMES A DAY AS NEEDED. Patient taking differently: Apply 2 g topically 4 (four) times daily as needed (joint pain).  02/13/18  Yes Bayard Hugger, NP  Docusate  Calcium (STOOL SOFTENER PO) Take 2 capsules by mouth daily.   Yes [provider]  HYDROcodone-acetaminophen (NORCO/VICODIN) 5-325 MG tablet Take 1 tablet by mouth 3 (three) times daily before meals. Do Not Fill Before 12/03/2018 11/19/18  Yes Bayard Hugger, NP  Multiple Vitamin (MULTIVITAMIN) capsule Take 1 capsule by mouth daily.    Yes [provider]  Multiple Vitamins-Minerals (PRESERVISION AREDS PO) Take 2 tablets by mouth daily.   Yes [provider]  Omega-3 Fatty Acids (OMEGA-3 PLUS PO) Take 1 tablet by mouth daily.    Yes [provider]  omeprazole (PRILOSEC) 20 MG capsule TAKE 2 CAPSULES DAILY Patient taking differently: Take 20 mg by mouth 2 (two) times daily before a meal.  03/24/19  Yes [name], MD  OVER THE COUNTER MEDICATION Take 2 capsules by mouth every morning. NITRIC OXIDE   Yes [provider]  rosuvastatin (CRESTOR) 40 MG tablet TAKE 1 TABLET DAILY Patient taking differently: Take 40 mg by mouth every evening.  03/21/19  Yes Lelon Perla, MD  cyclobenzaprine (FLEXERIL) 5 MG tablet TAKE 1 TABLET(5 MG) BY MOUTH AT BEDTIME AS NEEDED FOR MUSCLE SPASMS Patient taking differently: Take 5 mg by mouth at bedtime as needed for muscle spasms.  07/29/18   Bayard Hugger, NP  methylPREDNISolone (MEDROL DOSEPAK) 4 MG TBPK tablet As directed Patient not taking: Reported on 12/16/2019 12/04/19   Plotnikov, Evie Lacks, MD  nitroGLYCERIN (NITROSTAT) 0.4 MG SL tablet Place 1 tablet (0.4 mg total) under the tongue every 5 (five) minutes as needed for chest pain. 07/14/14   Lelon Perla, MD     Allergies:    Allergies  Allergen Reactions  . Flu Virus Vaccine Other (See Comments)    Several reaction.  Took it once woke up two days later in the hospital.   . Influenza Vaccines Other (See Comments)    unknown  . Sulfa Antibiotics Rash    Social History:   Social History   Socioeconomic History  . Marital status: Widowed    Spouse name: Not on file  . Number of children: Not on file  . Years of education: GED  . Highest education level: Not on file  Occupational History  . Occupation: retired  Tobacco Use  . Smoking status: Former Smoker    Packs/day: 3.00    Years: 38.00    Pack years: 114.00    Quit date: 04/10/1988    Years since quitting: 31.7  . Smokeless tobacco: Never Used  Vaping Use  . Vaping Use: Never used  Substance and Sexual Activity  . Alcohol use: Yes    Comment: 05/21/2017 "might drink 6 beers/year"  . Drug use: No  . Sexual activity: Not on file  Other Topics  Concern  . Not on file  Social History Narrative  . Not on file   Social Determinants of Health   Financial Resource Strain:   . Difficulty of Paying Living Expenses: Not on file  Food Insecurity:   . Worried About Charity fundraiser in the Last Year: Not on file  . Ran Out of Food in the Last Year: Not on file  Transportation Needs:   . Lack of Transportation (Medical): Not on file  . Lack of Transportation (Non-Medical): Not on file  Physical Activity:   . Days of Exercise per Week: Not on file  . Minutes of Exercise per Session: Not on file  Stress:   . Feeling of Stress :  Not on file  Social Connections:   . Frequency of Communication with Friends and Family: Not on file  . Frequency of Social Gatherings with Friends and Family: Not on file  . Attends Religious Services: Not on file  . Active Member of Clubs or Organizations: Not on file  . Attends Archivist Meetings: Not on file  . Marital Status: Not on file  Intimate Partner Violence:   . Fear of Current or Ex-Partner: Not on file  . Emotionally Abused: Not on file  . Physically Abused: Not on file  . Sexually Abused: Not on file    Family History:   The patient's family history includes Heart disease in his father and mother.    ROS:  Please see the history of present illness.  All other ROS reviewed and negative.     Physical Exam/Data:   Vitals:   12/17/19 1715 12/17/19 1730 12/17/19 1800 12/17/19 1830  BP: (!) 201/73 (!) 195/66 (!) 176/68 (!) 176/87  Pulse: 68 69 70 92  Resp: 20  17 (!) 25  Temp:      TempSrc:      SpO2: 94% 95% 92% 91%  Weight:      Height:       No intake or output data in the 24 hours ending 12/17/19 1856 Last 3 Weights 12/17/2019 12/16/2019 12/12/2019  Weight (lbs) 140 lb 140 lb 6.4 oz 141 lb  Weight (kg) 63.504 kg 63.685 kg 63.957 kg     Body mass index is 21.29 kg/m.  General:  Uncomfortable appearing, frequent belching HEENT: normal Lymph: no adenopathy Neck: no  JVD Endocrine:  No thryomegaly Vascular: No carotid bruits; FA pulses 2+ bilaterally without bruits  Cardiac:  normal S1, S2; RRR; no murmur  Lungs:  clear to auscultation bilaterally, no wheezing, rhonchi or rales  Abd: soft, nontender, no hepatomegaly  Ext: no edema Musculoskeletal:  No deformities, BUE and BLE strength normal and equal Skin: warm and dry . Left shoulder incision c/d/i. No hematoma. Neuro:  CNs 2-12 intact, no focal abnormalities noted Psych:  Normal affect    EKG:  The ECG that was done was personally reviewed and demonstrates paced rhythm.  Relevant CV Studies: Echocardiogram performed at the bedside shows no pericardial effusion. There is stable LV function from prior echo. Echo exam of the lungs shows apposition of the lung to pleura at bilateral apices.   Laboratory Data:  High Sensitivity Troponin:  No results for input(s): TROPONINIHS in the last 720 hours.    Chemistry Recent Labs  Lab 12/17/19 0955 12/17/19 1125  NA 142 142  K 5.1 4.3  CL 110 110  CO2 22 22  GLUCOSE 111* 104*  BUN 33* 32*  CREATININE 1.72* 1.64*  CALCIUM 9.1 9.0  GFRNONAA 35* 37*  GFRAA 41* 43*  ANIONGAP 10 10    No results for input(s): PROT, ALBUMIN, AST, ALT, ALKPHOS, BILITOT in the last 168 hours. Hematology Recent Labs  Lab 12/16/19 1508  WBC 9.7  RBC 3.96*  HGB 12.1*  HCT 37.7  MCV 95  MCH 30.6  MCHC 32.1  RDW 14.3  PLT 203   BNPNo results for input(s): BNP, PROBNP in the last 168 hours.  DDimer No results for input(s): DDIMER in the last 168 hours.   Radiology/Studies:  DG Chest Port 1 View  Result Date: 12/17/2019 CLINICAL DATA:  Pacemaker.  Shortness of breath. EXAM: PORTABLE CHEST 1 VIEW COMPARISON:  Radiograph 12/03/2019 FINDINGS: Dual lead left-sided pacemaker  in place. Leads project over the right atrium and ventricle. There is no pneumothorax. Emphysema with chronic hyperinflation. Stable heart size and mediastinal contours with large retrocardiac  hiatal hernia. There are increasing interstitial opacities in the mid lower lung zones. Blunting of the costophrenic angles likely due to chronic hyperinflation, difficult to exclude small pleural effusions. No acute osseous abnormalities are seen. IMPRESSION: 1. Dual lead left-sided pacemaker with leads overlying the right atrium and ventricle. No pneumothorax. 2. Increasing interstitial opacities in the mid lower lung zones, may represent pulmonary edema or atypical infection. 3. Emphysema with chronic hyperinflation. Blunting of the costophrenic angles likely due to hyperinflation, difficult to exclude small pleural effusions. 4. Large retrocardiac hiatal hernia. Electronically Signed   By: Keith Rake M.D.   On: 12/17/2019 18:51   ECHOCARDIOGRAM LIMITED  Result Date: 12/17/2019    ECHOCARDIOGRAM LIMITED REPORT   Patient Name:   Kristopher [name] Date of Exam: 12/17/2019 Medical Rec #:  [id number]      Height:       68.0 in Accession #:    [id number]     Weight:       140.0 lb Date of Birth:  Feb 17, 1931      BSA:          1.756 m Patient Age:    89 years       BP:           185/60 mmHg Patient Gender: M              HR:           37 bpm. Exam Location:  Inpatient Procedure: Limited Echo, Limited Color Doppler and Cardiac Doppler Indications:    Abnormal ECG 794.31 / R94.31  History:        Patient has prior history of Echocardiogram examinations, most                 recent 08/06/2019. CAD and Previous Myocardial Infarction,                 COPD, Signs/Symptoms:Shortness of Breath and Chest Pain; Risk                 Factors:Hypertension, Dyslipidemia, Former Smoker and Family                 History of Coronary Artery Disease. Abdominal Aortic Aneurysm.  Sonographer:    Darlina Sicilian RDCS Referring Phys: [place]  1. Left ventricular ejection fraction, by estimation, is 55 to 60%. The left ventricle has normal function. The left ventricle has no regional wall motion abnormalities. Left  ventricular diastolic parameters were normal.  2. Right ventricular systolic function is normal. The right ventricular size is normal.  3. Left atrial size was moderately dilated.  4. The mitral valve is degenerative. Mild mitral valve regurgitation. No evidence of mitral stenosis. Moderate mitral annular calcification.  5. The aortic valve is tricuspid. There is moderate calcification of the aortic valve. There is moderate thickening of the aortic valve. Aortic valve regurgitation is trivial. Mild aortic valve stenosis.  6. The inferior vena cava is normal in size with greater than 50% respiratory variability, suggesting right atrial pressure of 3 mmHg. FINDINGS  Left Ventricle: Left ventricular ejection fraction, by estimation, is 55 to 60%. The left ventricle has normal function. The left ventricle has no regional wall motion abnormalities. The left ventricular internal cavity size was normal in size. There is  no left ventricular hypertrophy.  Left ventricular diastolic parameters were normal. Right Ventricle: The right ventricular size is normal. No increase in right ventricular wall thickness. Right ventricular systolic function is normal. Left Atrium: Left atrial size was moderately dilated. Right Atrium: Right atrial size was normal in size. Pericardium: There is no evidence of pericardial effusion. Mitral Valve: The mitral valve is degenerative in appearance. There is moderate thickening of the mitral valve leaflet(s). There is moderate calcification of the mitral valve leaflet(s). Moderate mitral annular calcification. Mild mitral valve regurgitation. No evidence of mitral valve stenosis. Tricuspid Valve: The tricuspid valve is normal in structure. Tricuspid valve regurgitation is trivial. No evidence of tricuspid stenosis. Aortic Valve: The aortic valve is tricuspid. There is moderate calcification of the aortic valve. There is moderate thickening of the aortic valve. There is moderate aortic valve annular  calcification. Aortic valve regurgitation is trivial. Mild aortic stenosis is present. Aortic valve mean gradient measures 7.5 mmHg. Aortic valve peak gradient measures 17.9 mmHg. Aortic valve area, by VTI measures 1.97 cm. Pulmonic Valve: The pulmonic valve was normal in structure. Pulmonic valve regurgitation is not visualized. No evidence of pulmonic stenosis. Aorta: The aortic root is normal in size and structure. Venous: The inferior vena cava is normal in size with greater than 50% respiratory variability, suggesting right atrial pressure of 3 mmHg. IAS/Shunts: The interatrial septum was not well visualized. LEFT VENTRICLE PLAX 2D LVIDd:         4.90 cm LVIDs:         3.40 cm LV PW:         0.80 cm LV IVS:        0.80 cm LVOT diam:     2.30 cm LV SV:         105 LV SV Index:   60 LVOT Area:     4.15 cm  LEFT ATRIUM         Index LA diam:    4.60 cm 2.62 cm/m  AORTIC VALVE AV Area (Vmax):    1.65 cm AV Area (Vmean):   2.03 cm AV Area (VTI):     1.97 cm AV Vmax:           211.75 cm/s AV Vmean:          123.500 cm/s AV VTI:            0.533 m AV Peak Grad:      17.9 mmHg AV Mean Grad:      7.5 mmHg LVOT Vmax:         83.90 cm/s LVOT Vmean:        60.400 cm/s LVOT VTI:          0.253 m LVOT/AV VTI ratio: 0.47  AORTA Ao Root diam: 3.50 cm MITRAL VALVE MV Area (PHT): 6.17 cm     SHUNTS MV Decel Time: 123 msec     Systemic VTI:  0.25 m MV E velocity: 137.00 cm/s  Systemic Diam: 2.30 cm MV A velocity: 58.10 cm/s MV E/A ratio:  2.36 Jenkins Rouge MD Electronically signed by Jenkins Rouge MD Signature Date/Time: 12/17/2019/11:08:23 AM    Final    { Assessment and Plan:   1. Epigastric/Chest Pain Post PPM Likely GI related given relationship to incessant belching, presence of a hiatal hernia and temporary improvement when he does belch. I do not think this is related to the pacemaker leads as they are in stable position and his symptoms are not pleuritic. I want to observe him overnight  and treat his GI  symptoms. There may also be a component of his chronic pain contributing given he has been NPO and not taken his pain medications today.  - simethicone - maalox - vicodin - ppi  2. PPM in situ - cxr post op looks good without ptx and stable lead position - device check this evening     For questions or updates, please contact [place] Please consult [email or web address] for contact info under     Signed, Vickie Epley, MD  12/17/2019 6:56 PM

## 2019-12-17 NOTE — Interval H&P Note (Signed)
History and Physical Interval Note:  12/17/2019 3:18 PM  Kristopher [name]  has presented today for surgery, with the diagnosis of heart block.  The various methods of treatment have been discussed with the patient and family. After consideration of risks, benefits and other options for treatment, the patient has consented to  Procedure(s): PACEMAKER IMPLANT (N/A) as a surgical intervention.  The patient's history has been reviewed, patient examined, no change in status, stable for surgery.  I have reviewed the patient's chart and labs.  Questions were answered to the patient's satisfaction.     Allisa Einspahr T Mairead Schwarzkopf

## 2019-12-18 ENCOUNTER — Encounter (HOSPITAL_COMMUNITY): Payer: Self-pay | Admitting: Cardiology

## 2019-12-18 DIAGNOSIS — I252 Old myocardial infarction: Secondary | ICD-10-CM | POA: Diagnosis not present

## 2019-12-18 DIAGNOSIS — Z20822 Contact with and (suspected) exposure to covid-19: Secondary | ICD-10-CM | POA: Diagnosis not present

## 2019-12-18 DIAGNOSIS — I442 Atrioventricular block, complete: Principal | ICD-10-CM

## 2019-12-18 DIAGNOSIS — I2510 Atherosclerotic heart disease of native coronary artery without angina pectoris: Secondary | ICD-10-CM | POA: Diagnosis not present

## 2019-12-18 MED ORDER — SIMETHICONE 40 MG/0.6ML PO SUSP: 40.0000 mg | Freq: Four times a day (QID) | ORAL | Status: DC | PRN

## 2019-12-18 MED ORDER — ALUM & MAG HYDROXIDE-SIMETH 200-200-20 MG/5ML PO SUSP: 30.0000 mL | ORAL | 3 refills | Status: DC | PRN

## 2019-12-18 MED ORDER — SIMETHICONE 40 MG/0.6ML PO SUSP: 80.0000 mg | Freq: Four times a day (QID) | ORAL | 3 refills | Status: DC | PRN

## 2019-12-18 MED ORDER — SIMETHICONE 40 MG/0.6ML PO SUSP
80.0000 mg | Freq: Four times a day (QID) | ORAL | Status: DC | PRN
  Administered 2019-12-18: 80 mg via ORAL
  Filled 2019-12-18 (×2): qty 1.2

## 2019-12-18 MED ORDER — PANTOPRAZOLE SODIUM 40 MG PO TBEC: 40.0000 mg | DELAYED_RELEASE_TABLET | Freq: Every day | ORAL | 6 refills | Status: DC

## 2019-12-18 NOTE — Progress Notes (Signed)
D/C instructions given and reviewed. No questions voiced but encouraged to call with any concerns. Tele and IV removed. Tolerated well.

## 2019-12-18 NOTE — Discharge Summary (Addendum)
ELECTROPHYSIOLOGY PROCEDURE DISCHARGE SUMMARY    Patient ID: Kristopher [name],  MRN: [id number], DOB/AGE: [id number] 84 y.o.  Admit date: 12/17/2019 Discharge date: 12/18/2019  Primary Care Physician: [name], MD  Primary Cardiologist: [name], MD  Electrophysiologist: New to DR. Quentin Ore  Primary Discharge Diagnosis:  Symptomatic bradycardia due to CHB status post pacemaker implantation this admission  Secondary Discharge Diagnosis:  Large hiatal hernia HTN HLD COPD with new O2 requirement  Allergies  Allergen Reactions   Flu Virus Vaccine Other (See Comments)    Several reaction.  Took it once woke up two days later in the hospital.    Influenza Vaccines Other (See Comments)    unknown   Sulfa Antibiotics Rash     Procedures This Admission:  1.  Implantation of a [employer] dual chamber PPM on 12/17/2019 by Dr. Quentin Ore. The patient received a Sports coach number [id number] PPM with model number [id number] right atrial lead and [id number] right ventricular lead. There were no immediate post procedure complications. 2.  CXR on 12/17/2019 demonstrated no pneumothorax status post device implantation.   Brief HPI: Kristopher [name] is a 86 y.o. male was referred to electrophysiology in the outpatient setting for consideration of PPM implantation.  Past medical history includes above.  The patient has had symptomatic bradycardia without reversible causes identified.  Risks, benefits, and alternatives to PPM implantation were reviewed with the patient who wished to proceed.   Hospital Course:  The patient was admitted and underwent implantation of a [employer] dual chamber PPM with details as outlined above.  He was monitored on telemetry overnight which demonstrated appropriate pacing.  Left chest was without hematoma or ecchymosis.  The device was interrogated and found to be functioning normally.  CXR was obtained and demonstrated no pneumothorax status post device  implantation. Large hiatal hernia (known) noted. Wound care, arm mobility, and restrictions were reviewed with the patient.  The patient was examined and considered stable for discharge to home.    Pt is not on [place].   Prompt PCP follow up recommended for continued follow up for his hiatal hernia. Simethicone recommended for his gas pains.   Pt noted to have O2 sats as low as 86 with ambulation. O2 arranged for home. Suspect that this is another issue complicated by his large hiatal hernia, and management may allow for more lung expansion and eliminate his O2 requirement.   Physical Exam: Vitals:   12/18/19 0731 12/18/19 1010 12/18/19 1015 12/18/19 1018  BP: (!) 153/83     Pulse: 79     Resp: 18     Temp: 98.6 F (37 C)     TempSrc: Oral     SpO2: 92% 92% (!) 86% 92%  Weight:      Height:        GEN- The patient is well appearing, alert and oriented x 3 today.   HEENT: normocephalic, atraumatic; sclera clear, conjunctiva pink; hearing intact; oropharynx clear; neck supple, no JVP Lymph- no cervical lymphadenopathy Lungs- Clear to ausculation bilaterally, normal work of breathing.  No wheezes, rales, rhonchi Heart- Regular rate and rhythm, no murmurs, rubs or gallops, PMI not laterally displaced GI- soft, non-tender, non-distended, bowel sounds present, no hepatosplenomegaly Extremities- no clubbing, cyanosis, or edema; DP/PT/radial pulses 2+ bilaterally MS- no significant deformity or atrophy Skin- warm and dry, no rash or lesion, left chest without hematoma/ecchymosis Psych- euthymic mood, full affect Neuro- strength and sensation are intact   Labs:  Lab Results  Component Value Date   WBC 9.7 12/16/2019   HGB 12.1 (L) 12/16/2019   HCT 37.7 12/16/2019   MCV 95 12/16/2019   PLT 203 12/16/2019    Recent Labs  Lab 12/17/19 1125  NA 142  K 4.3  CL 110  CO2 22  BUN 32*  CREATININE 1.64*  CALCIUM 9.0  GLUCOSE 104*    Discharge Medications:  Allergies as of  12/18/2019       Reactions   Flu Virus Vaccine Other (See Comments)   Several reaction.  Took it once woke up two days later in the hospital.    Influenza Vaccines Other (See Comments)   unknown   Sulfa Antibiotics Rash        Medication List     STOP taking these medications    omeprazole 20 MG capsule Commonly known as: PRILOSEC Replaced by: pantoprazole 40 MG tablet       TAKE these medications    alum & mag hydroxide-simeth 341-937-90 MG/5ML suspension Commonly known as: MAALOX/MYLANTA Take 30 mLs by mouth every 4 (four) hours as needed for indigestion, heartburn or flatulence (belching).   aspirin EC 81 MG tablet Take 1 tablet (81 mg total) by mouth daily.   BEE POLLEN PO Take 1 capsule by mouth 2 (two) times daily.   Co-Enzyme Q-10 100 MG Caps Take 100 mg by mouth every evening.   cyclobenzaprine 5 MG tablet Commonly known as: FLEXERIL TAKE 1 TABLET(5 MG) BY MOUTH AT BEDTIME AS NEEDED FOR MUSCLE SPASMS What changed: See the new instructions.   diclofenac sodium 1 % Gel Commonly known as: VOLTAREN APPLY 2 GRAMS TOPICALLY FOUR TIMES A DAY AS NEEDED. What changed: See the new instructions.   HYDROcodone-acetaminophen 5-325 MG tablet Commonly known as: NORCO/VICODIN Take 1 tablet by mouth 3 (three) times daily before meals. Do Not Fill Before 12/03/2018   methylPREDNISolone 4 MG Tbpk tablet Commonly known as: MEDROL DOSEPAK As directed   multivitamin capsule Take 1 capsule by mouth daily.   nitroGLYCERIN 0.4 MG SL tablet Commonly known as: NITROSTAT Place 1 tablet (0.4 mg total) under the tongue every 5 (five) minutes as needed for chest pain.   OMEGA-3 PLUS PO Take 1 tablet by mouth daily.   OVER THE COUNTER MEDICATION Take 2 capsules by mouth every morning. NITRIC OXIDE   pantoprazole 40 MG tablet Commonly known as: PROTONIX Take 1 tablet (40 mg total) by mouth daily. Replaces: omeprazole 20 MG capsule   PRESERVISION AREDS PO Take 2  tablets by mouth daily.   rosuvastatin 40 MG tablet Commonly known as: CRESTOR TAKE 1 TABLET DAILY What changed: when to take this   simethicone 40 MG/0.6ML drops Commonly known as: MYLICON Take 1.2 mLs (80 mg total) by mouth every 6 (six) hours as needed for flatulence.   STOOL SOFTENER PO Take 2 capsules by mouth daily.   vitamin C 500 MG tablet Commonly known as: ASCORBIC ACID Take 500 mg by mouth 2 (two) times daily.               Durable Medical Equipment  (From admission, onward)           Start     Ordered   12/18/19 1027  For home use only DME oxygen  Once       Question Answer Comment  Length of Need 12 Months   Mode or (Route) Nasal cannula   Frequency Continuous (stationary and portable oxygen unit needed)  Oxygen conserving device Yes   Oxygen delivery system Gas      12/18/19 1027            Disposition:     Follow-up Information     Vickie Epley, MD Follow up on 03/30/2020.   Specialty: Cardiology Why: at 330 for 3 month pacer check Contact information: [place] [address] [phone]         [place] On 12/30/2019.   Why: at 230 for post pacemaker wound check Contact information: [place] [address] [phone]        [name], MD.   Specialties: Internal Medicine, Radiology Why: Please follow up in a week Contact information: [place] [address] [phone]                 Duration of Discharge Encounter: Greater than 30 minutes including physician time.  Signed, Shirley Friar, PA-C  12/18/2019 10:40 AM      -------------------------------------------------------------------------- I have seen, examined the patient, and reviewed the above assessment and plan.    Mr. Steelman is doing well this morning.  He had a restful night without chest  discomfort.  Device check shows stable lead parameters.  Chest x-ray yesterday evening shows no pneumothorax, stable lead position.  There is a large hiatal hernia occupying the majority of the mediastinal space which is the likely culprit for his GI upset yesterday evening.  He tells me that after he was able to belch yesterday evening, his discomfort improved.  With simethicone and Maalox his symptoms resolved overnight.  After speaking with him more this morning, he tells me that he has had multiple episodes/flareups of this sort of discomfort since being first diagnosed with a large hiatal hernia.  Was first diagnosed after he was constipated and while having a bowel movement had a sharp discomfort in the epigastrium very similar to the pain he experienced yesterday evening.  His other providers have not discussed hernia repair.  We will plan for discharge later this afternoon with continued GI regimen (PPI and Maalox/simethicone).  Will also send home with oxygen given ambulatory O2 sats. Suspect element of poor expansion in presence of large hernia. Recommend he see his primary care physician to discuss other treatment options for his large hiatal hernia given it seems to be causing frequent flareups and abdominal pain landing him in the emergency department. Also would like primary care to follow up o2 requirement and to confirm no worsening lung function that would require additional evaluation/treatment.   Lysbeth Galas T. Quentin Ore, MD, [facility] Cardiac Electrophysiology

## 2019-12-18 NOTE — Discharge Instructions (Signed)
After Your Pacemaker   . You have a Chiropractor  ACTIVITY . Do not lift your arm above shoulder height for 1 week after your procedure. After 7 days, you may progress as below.     Thursday December 25, 2019  Friday December 26, 2019 Saturday December 27, 2019 Sunday December 28, 2019   . Do not lift, push, pull, or carry anything over 10 pounds with the affected arm until 6 weeks (Thursday January 29, 2020 ) after your procedure.   . Do NOT DRIVE until you have been seen for your wound check, or as long as instructed by your healthcare provider.   . Ask your healthcare provider when you can go back to work   INCISION/Dressing . If you are on a blood thinner such as Coumadin, Xarelto, Eliquis, Plavix, or Pradaxa please confirm with your provider when this should be resumed.   . Monitor your Pacemaker site for redness, swelling, and drainage. Call the device clinic at [phone] if you experience these symptoms or fever/chills.  . If your incision is sealed with Steri-strips or staples, you may shower 10 days after your procedure or when told by your provider. Do not remove the steri-strips or let the shower hit directly on your site. You may wash around your site with soap and water.    Marland Kitchen Avoid lotions, ointments, or perfumes over your incision until it is well-healed.  . You may use a hot tub or a pool AFTER your wound check appointment if the incision is completely closed.  Marland Kitchen PAcemaker Alerts:  Some alerts are vibratory and others beep. These are NOT emergencies. Please call our office to let us know. If this occurs at night or on weekends, it can wait until the next business day. Send a remote transmission.  . If your device is capable of reading fluid status (for heart failure), you will be offered monthly monitoring to review this with you.   DEVICE MANAGEMENT . Remote monitoring is used to monitor your pacemaker from home. This monitoring is scheduled  every 91 days by our office. It allows [place] to keep an eye on the functioning of your device to ensure it is working properly. You will routinely see your Electrophysiologist annually (more often if necessary).   . You should receive your ID card for your new device in 4-8 weeks. Keep this card with you at all times once received. Consider wearing a medical alert bracelet or necklace.  . Your Pacemaker may be MRI compatible. This will be discussed at your next office visit/wound check.  You should avoid contact with strong electric or magnetic fields.    Do not use amateur (ham) radio equipment or electric (arc) welding torches. MP3 player headphones with magnets should not be used. Some devices are safe to use if held at least 12 inches (30 cm) from your Pacemaker. These include power tools, lawn mowers, and speakers. If you are unsure if something is safe to use, ask your health care provider.   When using your cell phone, hold it to the ear that is on the opposite side from the Pacemaker. Do not leave your cell phone in a pocket over the Pacemaker.   You may safely use electric blankets, heating pads, computers, and microwave ovens.  Call the office right away if:  You have chest pain.  You feel more short of breath than you have felt before.  You feel more light-headed than you have felt before.  Your incision starts to open up.  This information is not intended to replace advice given to you by your health care provider. Make sure you discuss any questions you have with your health care provider.

## 2019-12-18 NOTE — Plan of Care (Signed)

## 2019-12-18 NOTE — TOC Transition Note (Signed)
Transition of Care [facility]) - CM/SW Discharge Note   Patient Details  Name: Kristopher [name] MRN: [id number] Date of Birth: Jul 03, 1930  [facility] CM/SW Contact:  Zenon Mayo, RN Phone Number: 12/18/2019, 1:03 PM   Clinical Narrative:    NCM spoke with patient , he is ok to use [employer] for home oxygen, NCM made referral to [facility].  NCM informed MD need to put liter flow on oxygen order.   Final next level of care: Home/Self Care Barriers to Discharge: No Barriers Identified   Patient Goals and CMS Choice        Discharge Placement                       Discharge Plan and Services                DME Arranged: Oxygen DME Agency: [employer] Date DME Agency Contacted: 12/18/19 Time DME Agency Contacted: 1303 Representative spoke with at DME Agency: [place]: NA          Social Determinants of Health ([place]) Interventions     Readmission Risk Interventions No flowsheet data found.

## 2019-12-18 NOTE — Progress Notes (Signed)
SATURATION QUALIFICATIONS: (This note is used to comply with regulatory documentation for home oxygen)  Patient Saturations on Room Air at Rest =90%  Patient Saturations on Room Air while Ambulating = 86%  Patient Saturations on 2 Liters of oxygen while Ambulating = 94%  Please briefly explain why patient needs home oxygen: 

## 2019-12-24 ENCOUNTER — Telehealth: Payer: Self-pay | Admitting: Internal Medicine

## 2019-12-24 NOTE — Telephone Encounter (Signed)
    On 8/25 patient was advised to hold Ramipril . When should he start taking medication again? Pacemaker implant 9/8

## 2019-12-25 NOTE — Telephone Encounter (Signed)
He needs to make an appointment to see Dr. Jenny Reichmann.  His blood pressure needs to be rechecked.  Then the decision about restarting ramipril can be made. Thanks

## 2019-12-25 NOTE — Telephone Encounter (Signed)
Ok to see oct 1, thanks

## 2019-12-25 NOTE — Telephone Encounter (Signed)
Yes that is ok, thanks!

## 2019-12-26 NOTE — Telephone Encounter (Signed)
Patient informed. 

## 2019-12-30 ENCOUNTER — Other Ambulatory Visit: Payer: Self-pay

## 2019-12-30 ENCOUNTER — Ambulatory Visit (INDEPENDENT_AMBULATORY_CARE_PROVIDER_SITE_OTHER): Payer: Medicare PPO | Admitting: Emergency Medicine

## 2019-12-30 DIAGNOSIS — I442 Atrioventricular block, complete: Secondary | ICD-10-CM

## 2020-01-01 LAB — CUP PACEART INCLINIC DEVICE CHECK
Brady Statistic RA Percent Paced: 1 %
Brady Statistic RV Percent Paced: 100 %
Date Time Interrogation Session: 20210921143000
Implantable Lead Implant Date: 20210908
Implantable Lead Implant Date: 20210908
Implantable Lead Location: 753859
Implantable Lead Location: 753860
Implantable Lead Model: 7841
Implantable Lead Model: 7842
Implantable Lead Serial Number: 1054557
Implantable Lead Serial Number: 1096552
Implantable Pulse Generator Implant Date: 20210908
Lead Channel Impedance Value: 695 Ohm
Lead Channel Impedance Value: 739 Ohm
Lead Channel Pacing Threshold Amplitude: 0.8 V
Lead Channel Pacing Threshold Amplitude: 0.9 V
Lead Channel Pacing Threshold Pulse Width: 0.4 ms
Lead Channel Pacing Threshold Pulse Width: 0.4 ms
Lead Channel Sensing Intrinsic Amplitude: 1.8 mV
Lead Channel Setting Pacing Amplitude: 1.4 V
Lead Channel Setting Pacing Amplitude: 3.5 V
Lead Channel Setting Pacing Pulse Width: 0.4 ms
Lead Channel Setting Sensing Sensitivity: 3 mV
Pulse Gen Serial Number: 948694

## 2020-01-01 NOTE — Progress Notes (Signed)
Wound check appointment. Steri-strips removed. Wound without redness or edema. Incision edges approximated, wound well healed. Normal device function. Thresholds, sensing, and impedances consistent with implant measurements. Device programmed at 3.5V/auto capture programmed on for extra safety margin until 3 month visit. Histogram distribution appropriate for patient and level of activity. No mode switches or high ventricular rates noted. Patient educated about wound care, arm mobility, lifting restrictions. ROV with Dr Quentin Ore 03/30/20. Enrolled in remote follow-up and next remote 03/19/20.

## 2020-01-09 ENCOUNTER — Ambulatory Visit: Payer: Medicare PPO | Admitting: Internal Medicine

## 2020-01-17 DIAGNOSIS — Z950 Presence of cardiac pacemaker: Secondary | ICD-10-CM | POA: Diagnosis not present

## 2020-01-17 DIAGNOSIS — I442 Atrioventricular block, complete: Secondary | ICD-10-CM | POA: Diagnosis not present

## 2020-01-19 ENCOUNTER — Encounter: Payer: Self-pay | Admitting: Internal Medicine

## 2020-01-19 ENCOUNTER — Ambulatory Visit (INDEPENDENT_AMBULATORY_CARE_PROVIDER_SITE_OTHER): Payer: Medicare PPO | Admitting: Internal Medicine

## 2020-01-19 ENCOUNTER — Other Ambulatory Visit: Payer: Self-pay

## 2020-01-19 VITALS — BP 150/72 | HR 78 | Temp 98.0°F | Ht 68.0 in | Wt 133.0 lb

## 2020-01-19 DIAGNOSIS — R739 Hyperglycemia, unspecified: Secondary | ICD-10-CM

## 2020-01-19 DIAGNOSIS — I10 Essential (primary) hypertension: Secondary | ICD-10-CM | POA: Diagnosis not present

## 2020-01-19 DIAGNOSIS — J439 Emphysema, unspecified: Secondary | ICD-10-CM

## 2020-01-19 DIAGNOSIS — Z0001 Encounter for general adult medical examination with abnormal findings: Secondary | ICD-10-CM

## 2020-01-19 DIAGNOSIS — Z0000 Encounter for general adult medical examination without abnormal findings: Secondary | ICD-10-CM

## 2020-01-19 DIAGNOSIS — N1831 Chronic kidney disease, stage 3a: Secondary | ICD-10-CM

## 2020-01-19 DIAGNOSIS — K219 Gastro-esophageal reflux disease without esophagitis: Secondary | ICD-10-CM

## 2020-01-19 MED ORDER — PANTOPRAZOLE SODIUM 40 MG PO TBEC
40.0000 mg | DELAYED_RELEASE_TABLET | Freq: Every day | ORAL | 3 refills | Status: DC
Start: 2020-01-19 — End: 2020-01-23

## 2020-01-19 MED ORDER — AMLODIPINE BESYLATE 5 MG PO TABS: 5.0000 mg | ORAL_TABLET | Freq: Every day | ORAL | 3 refills | Status: DC

## 2020-01-19 NOTE — Progress Notes (Signed)
Subjective:    Patient ID: Kristopher [name], male    DOB: [birth year], 86 y.o.   MRN: [id number]  HPI  Here for wellness and f/u;  Overall doing ok;  Pt denies Chest pain, worsening SOB, DOE, wheezing, orthopnea, PND, worsening LE edema, palpitations, dizziness or syncope.  Pt denies neurological change such as new headache, facial or extremity weakness.  Pt denies polydipsia, polyuria, or low sugar symptoms. Pt states overall good compliance with treatment and medications, good tolerability, and has been trying to follow appropriate diet.  Pt denies worsening depressive symptoms, suicidal ideation or panic. No fever, night sweats, wt loss, loss of appetite, or other constitutional symptoms.  Pt states good ability with ADL's, has low fall risk, home safety reviewed and adequate, no other significant changes in hearing or vision, and only occasionally active with exercise. C/o mild worsening reflux, but no other abd pain, dysphagia, n/v, bowel change or blood. Past Medical History:  Diagnosis Date  . AAA (abdominal aortic aneurysm) ([place])    ultrasound 07/2011:  2.7 x 2.8 cm  . Acute myocardial infarction, unspecified site, episode of care unspecified 1998  . Aortic stenosis    echo 4/12: normal LVF, mild LAE, no significant AS  . Arthritis    "all over" (05/21/2017)  . CAD (coronary artery disease)    s/p PCI of LAD 1998;  s/p DES to RCA 05/2009;  Myoview 4/13: low risk, no ischemia, EF 56%  . CAP (community acquired pneumonia) 05/21/2017  . COPD (chronic obstructive pulmonary disease) ([place])   . DJD (degenerative joint disease), lumbar 07/06/2011  . Esophageal reflux   . HLD (hyperlipidemia)   . HTN (hypertension)   . PAD (peripheral artery disease) ([place])    s/p L CIA stent 09/2011 ([place])  . Pneumonia    "I've always had pneumonia; since I was 49 months old" (05/21/2017)  . Renal artery stenosis ([facility])    s/p bilat RA stents  . Urine incontinence    Past Surgical History:  Procedure  Laterality Date  . CATARACT EXTRACTION W/ INTRAOCULAR LENS  IMPLANT, BILATERAL Bilateral YRS AGO  . CORONARY ANGIOPLASTY WITH STENT PLACEMENT  05/1996   PCI of LAD; "I've got a total of 4 stents" (05/21/2017)  . [place]  . LOWER EXTREMITY ANGIOGRAM Bilateral 09/20/2011   Procedure: LOWER EXTREMITY ANGIOGRAM;  Surgeon: Wellington Hampshire, MD;  Location: [facility] CATH LAB;  Service: Cardiovascular;  Laterality: Bilateral;  . PACEMAKER IMPLANT N/A 12/17/2019   Procedure: PACEMAKER IMPLANT;  Surgeon: Vickie Epley, MD;  Location: [facility];  Service: Cardiovascular;  Laterality: N/A;  . TRANSURETHRAL RESECTION OF PROSTATE  08/11/2011   Procedure: TRANSURETHRAL RESECTION OF THE PROSTATE WITH GYRUS INSTRUMENTS;  Surgeon: Fredricka Bonine, MD;  Location: [facility] ORS;  Service: Urology;  Laterality: N/A;     . VASECTOMY  1968    reports that he quit smoking about 31 years ago. He has a 114.00 pack-year smoking history. He has never used smokeless tobacco. He reports current alcohol use. He reports that he does not use drugs. family history includes Heart disease in his father and mother. Allergies  Allergen Reactions  . Flu Virus Vaccine Other (See Comments)    Several reaction.  Took it once woke up two days later in the hospital.   . Influenza Vaccines Other (See Comments)    unknown  . Sulfa Antibiotics Rash   Current Outpatient Medications on File Prior to Visit  Medication Sig  Dispense Refill  . alum & mag hydroxide-simeth (MAALOX/MYLANTA) 200-200-20 MG/5ML suspension Take 30 mLs by mouth every 4 (four) hours as needed for indigestion, heartburn or flatulence (belching). 355 mL 3  . Ascorbic Acid (VITAMIN C) 500 MG tablet Take 500 mg by mouth 2 (two) times daily.     Marland Kitchen aspirin 81 MG tablet Take 1 tablet (81 mg total) by mouth daily.    Marland Kitchen BEE POLLEN PO Take 1 capsule by mouth 2 (two) times daily.     . cyclobenzaprine (FLEXERIL) 5 MG tablet TAKE 1 TABLET(5 MG) BY MOUTH AT  BEDTIME AS NEEDED FOR MUSCLE SPASMS (Patient taking differently: Take 5 mg by mouth at bedtime as needed for muscle spasms. ) 30 tablet 2  . diclofenac sodium (VOLTAREN) 1 % GEL APPLY 2 GRAMS TOPICALLY FOUR TIMES A DAY AS NEEDED. (Patient taking differently: Apply 2 g topically 4 (four) times daily as needed (joint pain). ) 700 g 4  . Docusate Calcium (STOOL SOFTENER PO) Take 2 capsules by mouth daily.    Marland Kitchen HYDROcodone-acetaminophen (NORCO/VICODIN) 5-325 MG tablet Take 1 tablet by mouth 3 (three) times daily before meals. Do Not Fill Before 12/03/2018 80 tablet 0  . Multiple Vitamin (MULTIVITAMIN) capsule Take 1 capsule by mouth daily.     . Multiple Vitamins-Minerals (PRESERVISION AREDS PO) Take 2 tablets by mouth daily.    . nitroGLYCERIN (NITROSTAT) 0.4 MG SL tablet Place 1 tablet (0.4 mg total) under the tongue every 5 (five) minutes as needed for chest pain. 90 tablet 3  . Omega-3 Fatty Acids (OMEGA-3 PLUS PO) Take 1 tablet by mouth daily.     Marland Kitchen OVER THE COUNTER MEDICATION Take 2 capsules by mouth every morning. NITRIC OXIDE    . rosuvastatin (CRESTOR) 40 MG tablet TAKE 1 TABLET DAILY (Patient taking differently: Take 40 mg by mouth every evening. ) 90 tablet 3  . simethicone (MYLICON) 40 KG/4.0NU drops Take 1.2 mLs (80 mg total) by mouth every 6 (six) hours as needed for flatulence. 30 mL 3   No current facility-administered medications on file prior to visit.   Review of Systems All otherwise neg per pt    Objective:   Physical Exam BP (!) 150/72 (BP Location: Left Arm, Patient Position: Sitting, Cuff Size: Large)   Pulse 78   Temp 98 F (36.7 C) (Oral)   Ht 5\' 8"  (1.727 m)   Wt 133 lb (60.3 kg)   SpO2 93%   BMI 20.22 kg/m  VS noted,  Constitutional: Pt appears in NAD HENT: Head: NCAT.  Right Ear: External ear normal.  Left Ear: External ear normal.  Eyes: . Pupils are equal, round, and reactive to light. Conjunctivae and EOM are normal Nose: without d/c or deformity Neck:  Neck supple. Gross normal ROM Cardiovascular: Normal rate and regular rhythm.   Pulmonary/Chest: Effort normal and breath sounds without rales or wheezing.  Abd:  Soft, NT, ND, + BS, no organomegaly Neurological: Pt is alert. At baseline orientation, motor grossly intact Skin: Skin is warm. No rashes, other new lesions, no LE edema Psychiatric: Pt behavior is normal without agitation  All otherwise neg per pt Lab Results  Component Value Date   WBC 9.7 12/16/2019   HGB 12.1 (L) 12/16/2019   HCT 37.7 12/16/2019   PLT 203 12/16/2019   GLUCOSE 104 (H) 12/17/2019   CHOL 143 09/20/2018   TRIG 94.0 09/20/2018   HDL 50.40 09/20/2018   LDLCALC 74 09/20/2018   ALT 20 11/06/2019  AST 62 (H) 11/06/2019   NA 142 12/17/2019   K 4.3 12/17/2019   CL 110 12/17/2019   CREATININE 1.64 (H) 12/17/2019   BUN 32 (H) 12/17/2019   CO2 22 12/17/2019   TSH 4.83 (H) 09/20/2018   INR 1.01 08/31/2013   HGBA1C 6.6 (H) 09/20/2018      Assessment & Plan:

## 2020-01-19 NOTE — Patient Instructions (Addendum)
Ok to increase the protonix to 40 mg twice per day  Please take all new medication as prescribed - the amlodipine 5 mg per day  Please continue all other medications as before, and refills have been done if requested.  Please have the pharmacy call with any other refills you may need.  Please continue your efforts at being more active, low cholesterol diet, and weight control.  Please keep your appointments with your specialists as you may have planned  Please make an Appointment to return in 6 months, or sooner if needed, also with Lab Appointment for testing done 3-5 days before at the [facility] (so this is for TWO appointments - please see the scheduling desk as you leave)

## 2020-01-23 ENCOUNTER — Telehealth: Payer: Self-pay | Admitting: Internal Medicine

## 2020-01-23 ENCOUNTER — Other Ambulatory Visit: Payer: Self-pay

## 2020-01-23 DIAGNOSIS — I442 Atrioventricular block, complete: Secondary | ICD-10-CM | POA: Diagnosis not present

## 2020-01-23 DIAGNOSIS — Z950 Presence of cardiac pacemaker: Secondary | ICD-10-CM | POA: Diagnosis not present

## 2020-01-23 MED ORDER — PANTOPRAZOLE SODIUM 40 MG PO TBEC
40.0000 mg | DELAYED_RELEASE_TABLET | Freq: Two times a day (BID) | ORAL | 3 refills | Status: DC
Start: 2020-01-23 — End: 2020-03-10

## 2020-01-23 NOTE — Telephone Encounter (Signed)
  Patient states pharmacy order for pantoprazole (PROTONIX) 40 MG tablet was not increased to twice a day as discussed. He was  Please verify increase and resend to pharmacy

## 2020-02-01 ENCOUNTER — Encounter: Payer: Self-pay | Admitting: Internal Medicine

## 2020-02-01 DIAGNOSIS — N183 Chronic kidney disease, stage 3 unspecified: Secondary | ICD-10-CM | POA: Insufficient documentation

## 2020-02-01 NOTE — Assessment & Plan Note (Addendum)
Uncontrolled, for increased protonix 40 bid  I spent 31 minutes in addition to time for CPX wellness examination in preparing to see the patient by review of recent labs, imaging and procedures, obtaining and reviewing separately obtained history, communicating with the patient and family or caregiver, ordering medications, tests or procedures, and documenting clinical information in the EHR including the differential Dx, treatment, and any further evaluation and other management of gerd, htn, ckd, hld, copd

## 2020-02-01 NOTE — Assessment & Plan Note (Signed)

## 2020-02-01 NOTE — Assessment & Plan Note (Signed)
stable overall by history and exam, recent data reviewed with pt, and pt to continue medical treatment as before,  to f/u any worsening symptoms or concerns  

## 2020-02-01 NOTE — Assessment & Plan Note (Signed)
Mild uncontrolled, for amlodipine 5 qd

## 2020-02-17 DIAGNOSIS — I442 Atrioventricular block, complete: Secondary | ICD-10-CM | POA: Diagnosis not present

## 2020-02-17 DIAGNOSIS — Z950 Presence of cardiac pacemaker: Secondary | ICD-10-CM | POA: Diagnosis not present

## 2020-02-23 DIAGNOSIS — I442 Atrioventricular block, complete: Secondary | ICD-10-CM | POA: Diagnosis not present

## 2020-02-23 DIAGNOSIS — Z950 Presence of cardiac pacemaker: Secondary | ICD-10-CM | POA: Diagnosis not present

## 2020-02-26 ENCOUNTER — Encounter: Payer: Self-pay | Admitting: Registered Nurse

## 2020-02-26 ENCOUNTER — Other Ambulatory Visit: Payer: Self-pay

## 2020-02-26 ENCOUNTER — Encounter: Payer: Medicare PPO | Attending: Registered Nurse | Admitting: Registered Nurse

## 2020-02-26 VITALS — BP 144/90 | HR 76 | Temp 98.0°F | Ht 68.0 in | Wt 135.0 lb

## 2020-02-26 DIAGNOSIS — G894 Chronic pain syndrome: Secondary | ICD-10-CM | POA: Diagnosis not present

## 2020-02-26 DIAGNOSIS — M25561 Pain in right knee: Secondary | ICD-10-CM | POA: Diagnosis not present

## 2020-02-26 DIAGNOSIS — M48062 Spinal stenosis, lumbar region with neurogenic claudication: Secondary | ICD-10-CM | POA: Diagnosis not present

## 2020-02-26 DIAGNOSIS — Z79891 Long term (current) use of opiate analgesic: Secondary | ICD-10-CM

## 2020-02-26 DIAGNOSIS — M62838 Other muscle spasm: Secondary | ICD-10-CM | POA: Insufficient documentation

## 2020-02-26 DIAGNOSIS — Z5181 Encounter for therapeutic drug level monitoring: Secondary | ICD-10-CM | POA: Insufficient documentation

## 2020-02-26 NOTE — Progress Notes (Signed)
Subjective:    Patient ID: Kristopher [name], male    DOB: Mar 30, 1931, 86 y.o.   MRN: [id number]  HPI: Kristopher [name] is a 86 y.o. male who returns for follow up appointment for chronic pain and medication refill. He states his pain is located in his lower back with periods of long standing and right knee pain. He rates his pain 2. His current exercise regime is walking and performing stretching exercises.  Mr. Siedschlag was admitted to [facility] on 12/17/2019 and discharged home on 12/18/2019 for pacemaker implantation for CHB.  PACEMAKER IMPLANT on 12/17/2019 by Dr Quentin Ore.  Mr. Chuba Morphine equivalent is 10.00 MME. Last Fill date 12/05/2018.   Pain Inventory Average Pain 2 Pain Right Now 2 My pain is intermittent and sharp  In the last 24 hours, has pain interfered with the following? General activity 5 Relation with others 0 Enjoyment of life 2 What TIME of day is your pain at its worst? varies Sleep (in general) Poor  Pain is worse with: bending Pain improves with: medication Relief from Meds: 10  Family History  Problem Relation Age of Onset  . Heart disease Mother   . Heart disease Father    Social History   Socioeconomic History  . Marital status: Widowed    Spouse name: Not on file  . Number of children: Not on file  . Years of education: GED  . Highest education level: Not on file  Occupational History  . Occupation: retired  Tobacco Use  . Smoking status: Former Smoker    Packs/day: 3.00    Years: 38.00    Pack years: 114.00    Quit date: 04/10/1988    Years since quitting: 31.9  . Smokeless tobacco: Never Used  Vaping Use  . Vaping Use: Never used  Substance and Sexual Activity  . Alcohol use: Yes    Comment: 05/21/2017 "might drink 6 beers/year"  . Drug use: No  . Sexual activity: Not on file  Other Topics Concern  . Not on file  Social History Narrative  . Not on file   Social Determinants of Health   Financial Resource Strain:   . Difficulty  of Paying Living Expenses: Not on file  Food Insecurity:   . Worried About Charity fundraiser in the Last Year: Not on file  . Ran Out of Food in the Last Year: Not on file  Transportation Needs:   . Lack of Transportation (Medical): Not on file  . Lack of Transportation (Non-Medical): Not on file  Physical Activity:   . Days of Exercise per Week: Not on file  . Minutes of Exercise per Session: Not on file  Stress:   . Feeling of Stress : Not on file  Social Connections:   . Frequency of Communication with Friends and Family: Not on file  . Frequency of Social Gatherings with Friends and Family: Not on file  . Attends Religious Services: Not on file  . Active Member of Clubs or Organizations: Not on file  . Attends Archivist Meetings: Not on file  . Marital Status: Not on file   Past Surgical History:  Procedure Laterality Date  . CATARACT EXTRACTION W/ INTRAOCULAR LENS  IMPLANT, BILATERAL Bilateral YRS AGO  . CORONARY ANGIOPLASTY WITH STENT PLACEMENT  05/1996   PCI of LAD; "I've got a total of 4 stents" (05/21/2017)  . [place]  . LOWER EXTREMITY ANGIOGRAM Bilateral 09/20/2011   Procedure: LOWER EXTREMITY  ANGIOGRAM;  Surgeon: Wellington Hampshire, MD;  Location: [facility] CATH LAB;  Service: Cardiovascular;  Laterality: Bilateral;  . PACEMAKER IMPLANT N/A 12/17/2019   Procedure: PACEMAKER IMPLANT;  Surgeon: Vickie Epley, MD;  Location: [place] [facility] CV LAB;  Service: Cardiovascular;  Laterality: N/A;  . TRANSURETHRAL RESECTION OF PROSTATE  08/11/2011   Procedure: TRANSURETHRAL RESECTION OF THE PROSTATE WITH GYRUS INSTRUMENTS;  Surgeon: Fredricka Bonine, MD;  Location: [facility] ORS;  Service: Urology;  Laterality: N/A;     . [place]   Past Surgical History:  Procedure Laterality Date  . CATARACT EXTRACTION W/ INTRAOCULAR LENS  IMPLANT, BILATERAL Bilateral YRS AGO  . CORONARY ANGIOPLASTY WITH STENT PLACEMENT  05/1996   PCI of LAD; "I've got a total of 4  stents" (05/21/2017)  . [place]  . LOWER EXTREMITY ANGIOGRAM Bilateral 09/20/2011   Procedure: LOWER EXTREMITY ANGIOGRAM;  Surgeon: Wellington Hampshire, MD;  Location: [facility] CATH LAB;  Service: Cardiovascular;  Laterality: Bilateral;  . PACEMAKER IMPLANT N/A 12/17/2019   Procedure: PACEMAKER IMPLANT;  Surgeon: Vickie Epley, MD;  Location: [facility] CV LAB;  Service: Cardiovascular;  Laterality: N/A;  . TRANSURETHRAL RESECTION OF PROSTATE  08/11/2011   Procedure: TRANSURETHRAL RESECTION OF THE PROSTATE WITH GYRUS INSTRUMENTS;  Surgeon: Fredricka Bonine, MD;  Location: [facility] ORS;  Service: Urology;  Laterality: N/A;     . VASECTOMY  1968   Past Medical History:  Diagnosis Date  . AAA (abdominal aortic aneurysm) ([place])    ultrasound 07/2011:  2.7 x 2.8 cm  . Acute myocardial infarction, unspecified site, episode of care unspecified 1998  . Aortic stenosis    echo 4/12: normal LVF, mild LAE, no significant AS  . Arthritis    "all over" (05/21/2017)  . CAD (coronary artery disease)    s/p PCI of LAD 1998;  s/p DES to RCA 05/2009;  Myoview 4/13: low risk, no ischemia, EF 56%  . CAP (community acquired pneumonia) 05/21/2017  . COPD (chronic obstructive pulmonary disease) ([place])   . DJD (degenerative joint disease), lumbar 07/06/2011  . Esophageal reflux   . HLD (hyperlipidemia)   . HTN (hypertension)   . PAD (peripheral artery disease) ([place])    s/p L CIA stent 09/2011 ([place])  . Pneumonia    "I've always had pneumonia; since I was 56 months old" (05/21/2017)  . Renal artery stenosis ([facility])    s/p bilat RA stents  . Urine incontinence    BP (!) 144/90   Pulse 76   Temp 98 F (36.7 C)   Ht 5\' 8"  (1.727 m)   Wt 135 lb (61.2 kg)   SpO2 96%   BMI 20.53 kg/m   Opioid Risk Score:   Fall Risk Score:  `1  Depression screen PHQ 2/9  Depression screen [facility] 2/9 01/19/2020 12/12/2019 09/20/2018 09/12/2018 04/11/2018 02/12/2018 01/09/2018  Decreased Interest 0 0 0 0 0 0 0  Down,  Depressed, Hopeless 0 0 0 0 0 0 0  PHQ - 2 Score 0 0 0 0 0 0 0  Altered sleeping - - - - - - -  Tired, decreased energy - - - - - - -  Change in appetite - - - - - - -  Feeling bad or failure about yourself  - - - - - - -  Trouble concentrating - - - - - - -  Moving slowly or fidgety/restless - - - - - - -  Suicidal thoughts - - - - - - -  PHQ-9 Score - - - - - - -  Some recent data might be hidden   Review of Systems  Constitutional: Negative.   HENT: Negative.   Respiratory: Negative.   Cardiovascular: Negative.   Gastrointestinal: Negative.   Endocrine: Negative.   Genitourinary: Negative.   Musculoskeletal: Positive for arthralgias and gait problem.  Skin: Negative.   Allergic/Immunologic: Negative.   Hematological: Negative.   Psychiatric/Behavioral: Negative.   All other systems reviewed and are negative.      Objective:   Physical Exam Vitals and nursing note reviewed.  Constitutional:      Appearance: Normal appearance.  Cardiovascular:     Rate and Rhythm: Normal rate and regular rhythm.     Pulses: Normal pulses.     Heart sounds: Normal heart sounds.  Pulmonary:     Effort: Pulmonary effort is normal.     Breath sounds: Normal breath sounds.  Musculoskeletal:     Cervical back: Normal range of motion and neck supple.     Comments: Normal Muscle Bulk and Muscle Testing Reveals:  Upper Extremities: Full ROM and Muscle Strength 5/5 Lower Extremities: Full ROM and Muscle Strength 5/5 Arises from chair with ease Narrow Based  Gait   Skin:    General: Skin is warm and dry.  Neurological:     Mental Status: He is alert and oriented to person, place, and time.  Psychiatric:        Mood and Affect: Mood normal.        Behavior: Behavior normal.           Assessment & Plan:  1.Patellofemoral arthritis. Continue Voltaren gel, and continue with exercise regimen.02/26/2020 2 Chronic Low Back Pain/ Lumbar Stenosis:Continue HEP as Tolerated and Continue to  Monitor.Continue current medication regimen.02/26/2020. 3. Chronic Pain:Slow Weaning:Continueto Monitor No script given today.ContinueHydrocodone 5/325 mg one tablet three times a dayas needed for pain#80.02/26/2020. We will continue the opioid monitoring program, this consists of regular clinic visits, examinations, urine drug screen, pill counts as well as use of [place] Controlled Substance Reporting system. A 12 month History has been reviewed on the [place] on 02/26/2020. 4. Muscle Spasm: Continuecurrent medication regimen withFlexeril.02/26/2020 5. Primary Osteoarthritis of Right Hip: Continue to Alternate with Ice and Heat Therapy. Continue to Monitor.02/26/2020 6. Idiopathic Peripheral Neuropathy:No Complaints today.Refuses Gabapentin or Amitriptylline at this time. Continue to Monitor.02/26/2020  F/U in1 year

## 2020-03-09 ENCOUNTER — Telehealth: Payer: Self-pay | Admitting: Internal Medicine

## 2020-03-09 NOTE — Telephone Encounter (Signed)
   1.Medication Requested: pantoprazole (PROTONIX) 40 MG tablet  2. Pharmacy (Name, Street, City):[facility] [place] 3. On Med List: yes  4. Last Visit with PCP: 01/23/20  5. Next visit date with PCP: 07/23/20   Agent: Please be advised that RX refills may take up to 3 business days. We ask that you follow-up with your pharmacy.

## 2020-03-10 ENCOUNTER — Other Ambulatory Visit: Payer: Self-pay

## 2020-03-10 MED ORDER — PANTOPRAZOLE SODIUM 40 MG PO TBEC
40.0000 mg | DELAYED_RELEASE_TABLET | Freq: Two times a day (BID) | ORAL | 3 refills | Status: DC
Start: 2020-03-10 — End: 2021-01-12

## 2020-03-15 ENCOUNTER — Other Ambulatory Visit: Payer: Self-pay | Admitting: Cardiology

## 2020-03-18 DIAGNOSIS — I442 Atrioventricular block, complete: Secondary | ICD-10-CM | POA: Diagnosis not present

## 2020-03-18 DIAGNOSIS — Z950 Presence of cardiac pacemaker: Secondary | ICD-10-CM | POA: Diagnosis not present

## 2020-03-19 ENCOUNTER — Ambulatory Visit (INDEPENDENT_AMBULATORY_CARE_PROVIDER_SITE_OTHER): Payer: Medicare PPO

## 2020-03-19 DIAGNOSIS — I442 Atrioventricular block, complete: Secondary | ICD-10-CM | POA: Diagnosis not present

## 2020-03-21 LAB — CUP PACEART REMOTE DEVICE CHECK
Battery Remaining Longevity: 162 mo
Battery Remaining Percentage: 100 %
Brady Statistic RA Percent Paced: 4 %
Brady Statistic RV Percent Paced: 49 %
Date Time Interrogation Session: 20211210010200
Implantable Lead Implant Date: 20210908
Implantable Lead Implant Date: 20210908
Implantable Lead Location: 753859
Implantable Lead Location: 753860
Implantable Lead Model: 7841
Implantable Lead Model: 7842
Implantable Lead Serial Number: 1054557
Implantable Lead Serial Number: 1096552
Implantable Pulse Generator Implant Date: 20210908
Lead Channel Impedance Value: 671 Ohm
Lead Channel Impedance Value: 919 Ohm
Lead Channel Pacing Threshold Amplitude: 0.6 V
Lead Channel Pacing Threshold Amplitude: 0.6 V
Lead Channel Pacing Threshold Pulse Width: 0.4 ms
Lead Channel Pacing Threshold Pulse Width: 0.4 ms
Lead Channel Setting Pacing Amplitude: 1.1 V
Lead Channel Setting Pacing Amplitude: 3.5 V
Lead Channel Setting Pacing Pulse Width: 0.4 ms
Lead Channel Setting Sensing Sensitivity: 3 mV
Pulse Gen Serial Number: 948694

## 2020-03-24 DIAGNOSIS — Z950 Presence of cardiac pacemaker: Secondary | ICD-10-CM | POA: Diagnosis not present

## 2020-03-24 DIAGNOSIS — I442 Atrioventricular block, complete: Secondary | ICD-10-CM | POA: Diagnosis not present

## 2020-03-30 ENCOUNTER — Ambulatory Visit (INDEPENDENT_AMBULATORY_CARE_PROVIDER_SITE_OTHER): Payer: Medicare PPO | Admitting: Cardiology

## 2020-03-30 ENCOUNTER — Encounter: Payer: Self-pay | Admitting: Cardiology

## 2020-03-30 ENCOUNTER — Other Ambulatory Visit: Payer: Self-pay

## 2020-03-30 VITALS — BP 142/70 | HR 78 | Ht 68.0 in | Wt 135.0 lb

## 2020-03-30 DIAGNOSIS — Z950 Presence of cardiac pacemaker: Secondary | ICD-10-CM | POA: Diagnosis not present

## 2020-03-30 DIAGNOSIS — I442 Atrioventricular block, complete: Secondary | ICD-10-CM

## 2020-03-30 NOTE — Patient Instructions (Signed)
Medication Instructions:  Your physician recommends that you continue on your current medications as directed. Please refer to the Current Medication list given to you today.  Labwork: None ordered.  Testing/Procedures: None ordered.  Follow-Up: Your physician wants you to follow-up in:  9 months with Dr. Quentin Ore.  You will receive a reminder letter in the mail two months in advance. If you don't receive a letter, please call our office to schedule the follow-up appointment.  Remote monitoring is used to monitor your Pacemaker from home. This monitoring reduces the number of office visits required to check your device to one time per year. It allows [place] to keep an eye on the functioning of your device to ensure it is working properly. You are scheduled for a device check from home on 06/18/2020. You may send your transmission at any time that day. If you have a wireless device, the transmission will be sent automatically. After your physician reviews your transmission, you will receive a postcard with your next transmission date.  Any Other Special Instructions Will Be Listed Below (If Applicable).  If you need a refill on your cardiac medications before your next appointment, please call your pharmacy.

## 2020-03-30 NOTE — Progress Notes (Signed)
Electrophysiology Office Follow up Visit Note:    Date:  03/30/2020   ID:  Kristopher [name], DOB 1930-08-22, MRN [id number]  PCP:  [name], MD  [facility] Cardiologist:  [name], MD  [facility] Electrophysiologist:  Vickie Epley, MD    Interval History:    Kristopher [name] is a 86 y.o. male who presents for a follow up visit after permanent pacemaker implant on December 17, 2019 for complete heart block.  Since that time, he has done very well.  His device is well-healed.  No syncope or presyncope.    Past Medical History:  Diagnosis Date  . AAA (abdominal aortic aneurysm) ([place])    ultrasound 07/2011:  2.7 x 2.8 cm  . Acute myocardial infarction, unspecified site, episode of care unspecified 1998  . Aortic stenosis    echo 4/12: normal LVF, mild LAE, no significant AS  . Arthritis    "all over" (05/21/2017)  . CAD (coronary artery disease)    s/p PCI of LAD 1998;  s/p DES to RCA 05/2009;  Myoview 4/13: low risk, no ischemia, EF 56%  . CAP (community acquired pneumonia) 05/21/2017  . COPD (chronic obstructive pulmonary disease) ([place])   . DJD (degenerative joint disease), lumbar 07/06/2011  . Esophageal reflux   . HLD (hyperlipidemia)   . HTN (hypertension)   . PAD (peripheral artery disease) ([place])    s/p L CIA stent 09/2011 ([place])  . Pneumonia    "I've always had pneumonia; since I was 8 months old" (05/21/2017)  . Renal artery stenosis ([facility])    s/p bilat RA stents  . Urine incontinence     Past Surgical History:  Procedure Laterality Date  . CATARACT EXTRACTION W/ INTRAOCULAR LENS  IMPLANT, BILATERAL Bilateral YRS AGO  . CORONARY ANGIOPLASTY WITH STENT PLACEMENT  05/1996   PCI of LAD; "I've got a total of 4 stents" (05/21/2017)  . [place]  . LOWER EXTREMITY ANGIOGRAM Bilateral 09/20/2011   Procedure: LOWER EXTREMITY ANGIOGRAM;  Surgeon: Wellington Hampshire, MD;  Location: [facility] CATH LAB;  Service: Cardiovascular;  Laterality: Bilateral;  .  PACEMAKER IMPLANT N/A 12/17/2019   Procedure: PACEMAKER IMPLANT;  Surgeon: Vickie Epley, MD;  Location: [place] CV LAB;  Service: Cardiovascular;  Laterality: N/A;  . TRANSURETHRAL RESECTION OF PROSTATE  08/11/2011   Procedure: TRANSURETHRAL RESECTION OF THE PROSTATE WITH GYRUS INSTRUMENTS;  Surgeon: Fredricka Bonine, MD;  Location: [facility] ORS;  Service: Urology;  Laterality: N/A;     . VASECTOMY  1968    Current Medications: Current Meds  Medication Sig  . alum & mag hydroxide-simeth (MAALOX/MYLANTA) 200-200-20 MG/5ML suspension Take 30 mLs by mouth every 4 (four) hours as needed for indigestion, heartburn or flatulence (belching).  Marland Kitchen amLODipine (NORVASC) 5 MG tablet Take 1 tablet (5 mg total) by mouth daily.  . Ascorbic Acid (VITAMIN C) 500 MG tablet Take 500 mg by mouth 2 (two) times daily.  Marland Kitchen aspirin 81 MG tablet Take 1 tablet (81 mg total) by mouth daily.  Marland Kitchen BEE POLLEN PO Take 1 capsule by mouth 2 (two) times daily.   . cyclobenzaprine (FLEXERIL) 5 MG tablet TAKE 1 TABLET(5 MG) BY MOUTH AT BEDTIME AS NEEDED FOR MUSCLE SPASMS (Patient taking differently: Take 5 mg by mouth at bedtime as needed for muscle spasms.)  . diclofenac sodium (VOLTAREN) 1 % GEL APPLY 2 GRAMS TOPICALLY FOUR TIMES A DAY AS NEEDED. (Patient taking differently: Apply 2 g topically 4 (four)  times daily as needed (joint pain).)  . Docusate Calcium (STOOL SOFTENER PO) Take 2 capsules by mouth daily.  Marland Kitchen HYDROcodone-acetaminophen (NORCO/VICODIN) 5-325 MG tablet Take 1 tablet by mouth 3 (three) times daily before meals. Do Not Fill Before 12/03/2018  . Multiple Vitamin (MULTIVITAMIN) capsule Take 1 capsule by mouth daily.  . Multiple Vitamins-Minerals (PRESERVISION AREDS PO) Take 2 tablets by mouth daily.  . nitroGLYCERIN (NITROSTAT) 0.4 MG SL tablet Place 1 tablet (0.4 mg total) under the tongue every 5 (five) minutes as needed for chest pain.  Marland Kitchen OVER THE COUNTER MEDICATION Take 2 capsules by mouth every morning.  NITRIC OXIDE  . pantoprazole (PROTONIX) 40 MG tablet Take 1 tablet (40 mg total) by mouth 2 (two) times daily.  . rosuvastatin (CRESTOR) 40 MG tablet TAKE 1 TABLET DAILY     Allergies:   Influenza vaccines, Influenza virus vaccine, and Sulfa antibiotics   Social History   Socioeconomic History  . Marital status: Widowed    Spouse name: Not on file  . Number of children: Not on file  . Years of education: GED  . Highest education level: Not on file  Occupational History  . Occupation: retired  Tobacco Use  . Smoking status: Former Smoker    Packs/day: 3.00    Years: 38.00    Pack years: 114.00    Quit date: 04/10/1988    Years since quitting: 31.9  . Smokeless tobacco: Never Used  Vaping Use  . Vaping Use: Never used  Substance and Sexual Activity  . Alcohol use: Yes    Comment: 05/21/2017 "might drink 6 beers/year"  . Drug use: No  . Sexual activity: Not on file  Other Topics Concern  . Not on file  Social History Narrative  . Not on file   Social Determinants of Health   Financial Resource Strain: Not on file  Food Insecurity: Not on file  Transportation Needs: Not on file  Physical Activity: Not on file  Stress: Not on file  Social Connections: Not on file     Family History: The patient's family history includes Heart disease in his father and mother.  ROS:   Please see the history of present illness.    All other systems reviewed and are negative.  EKGs/Labs/Other Studies Reviewed:    The following studies were reviewed today: EKG, device interrogation, pacemaker implant note    EKG:  The ekg ordered today demonstrates sinus rhythm with a ventricular paced QRS  March 30, 2020 device interrogation personally reviewed Longevity 14 years Lead parameters stable Atrial lead impedance 673 ohms, sensing 3.5 mV, pacing threshold 0.9 at 0.4 Ventricular lead impedance 914 ohms, no sensed ventricular intrinsic, pacing threshold 0.5 at 0.4 ms DDD 60-1  30  Recent Labs: 11/06/2019: ALT 20 12/16/2019: Hemoglobin 12.1; Platelets 203 12/17/2019: BUN 32; Creatinine, Ser 1.64; Potassium 4.3; Sodium 142  Recent Lipid Panel    Component Value Date/Time   CHOL 143 09/20/2018 1103   CHOL 151 06/29/2017 0000   TRIG 94.0 09/20/2018 1103   HDL 50.40 09/20/2018 1103   HDL 53 06/29/2017 0000   CHOLHDL 3 09/20/2018 1103   VLDL 18.8 09/20/2018 1103   LDLCALC 74 09/20/2018 1103   LDLCALC 82 06/29/2017 0000    Physical Exam:    VS:  BP (!) 142/70   Pulse 78   Ht 5\' 8"  (1.727 m)   Wt 135 lb (61.2 kg)   HC 68" (172.7 cm)   SpO2 94%   BMI 20.53  kg/m     Wt Readings from Last 3 Encounters:  03/30/20 135 lb (61.2 kg)  02/26/20 135 lb (61.2 kg)  01/19/20 133 lb (60.3 kg)     GEN:  Well nourished, well developed in no acute distress HEENT: Normal NECK: No JVD; No carotid bruits LYMPHATICS: No lymphadenopathy CARDIAC: RRR, no murmurs, rubs, gallops.  Device pocket well-healed without pain in the pocket. RESPIRATORY:  Clear to auscultation without rales, wheezing or rhonchi  ABDOMEN: Soft, non-tender, non-distended MUSCULOSKELETAL:  No edema; No deformity  SKIN: Warm and dry NEUROLOGIC:  Alert and oriented x 3 PSYCHIATRIC:  Normal affect   ASSESSMENT:    1. Complete heart block ([place])   2. Pacemaker    PLAN:    In order of problems listed above:  1. Complete heart block Doing well after permanent pacemaker implant.  Device interrogation shows stable lead parameters with good battery longevity.  Continue remote device checks with in person device interrogation in 9 months.   Medication Adjustments/Labs and Tests Ordered: Current medicines are reviewed at length with the patient today.  Concerns regarding medicines are outlined above.  Orders Placed This Encounter  Procedures  . EKG 12-Lead   No orders of the defined types were placed in this encounter.    Signed, Lars Mage, MD, [facility]  03/30/2020 10:24 PM     Electrophysiology [place]

## 2020-04-01 NOTE — Progress Notes (Signed)
Remote pacemaker transmission.   

## 2020-04-18 DIAGNOSIS — Z950 Presence of cardiac pacemaker: Secondary | ICD-10-CM | POA: Diagnosis not present

## 2020-04-18 DIAGNOSIS — I442 Atrioventricular block, complete: Secondary | ICD-10-CM | POA: Diagnosis not present

## 2020-04-24 DIAGNOSIS — Z950 Presence of cardiac pacemaker: Secondary | ICD-10-CM | POA: Diagnosis not present

## 2020-04-24 DIAGNOSIS — I442 Atrioventricular block, complete: Secondary | ICD-10-CM | POA: Diagnosis not present

## 2020-05-19 DIAGNOSIS — Z950 Presence of cardiac pacemaker: Secondary | ICD-10-CM | POA: Diagnosis not present

## 2020-05-19 DIAGNOSIS — I442 Atrioventricular block, complete: Secondary | ICD-10-CM | POA: Diagnosis not present

## 2020-05-25 DIAGNOSIS — Z950 Presence of cardiac pacemaker: Secondary | ICD-10-CM | POA: Diagnosis not present

## 2020-05-25 DIAGNOSIS — I442 Atrioventricular block, complete: Secondary | ICD-10-CM | POA: Diagnosis not present

## 2020-06-16 DIAGNOSIS — I442 Atrioventricular block, complete: Secondary | ICD-10-CM | POA: Diagnosis not present

## 2020-06-16 DIAGNOSIS — Z950 Presence of cardiac pacemaker: Secondary | ICD-10-CM | POA: Diagnosis not present

## 2020-06-18 ENCOUNTER — Ambulatory Visit (INDEPENDENT_AMBULATORY_CARE_PROVIDER_SITE_OTHER): Payer: Medicare PPO

## 2020-06-18 DIAGNOSIS — I442 Atrioventricular block, complete: Secondary | ICD-10-CM

## 2020-06-22 DIAGNOSIS — Z950 Presence of cardiac pacemaker: Secondary | ICD-10-CM | POA: Diagnosis not present

## 2020-06-22 DIAGNOSIS — I442 Atrioventricular block, complete: Secondary | ICD-10-CM | POA: Diagnosis not present

## 2020-06-25 NOTE — Progress Notes (Signed)
Remote pacemaker transmission.   

## 2020-07-17 DIAGNOSIS — I442 Atrioventricular block, complete: Secondary | ICD-10-CM | POA: Diagnosis not present

## 2020-07-17 DIAGNOSIS — Z950 Presence of cardiac pacemaker: Secondary | ICD-10-CM | POA: Diagnosis not present

## 2020-07-19 ENCOUNTER — Other Ambulatory Visit (INDEPENDENT_AMBULATORY_CARE_PROVIDER_SITE_OTHER): Payer: Medicare PPO

## 2020-07-19 DIAGNOSIS — N1831 Chronic kidney disease, stage 3a: Secondary | ICD-10-CM | POA: Diagnosis not present

## 2020-07-19 LAB — URINALYSIS, ROUTINE W REFLEX MICROSCOPIC
Bilirubin Urine: NEGATIVE
Hgb urine dipstick: NEGATIVE
Ketones, ur: NEGATIVE
Leukocytes,Ua: NEGATIVE
Nitrite: NEGATIVE
Specific Gravity, Urine: 1.025 (ref 1.000–1.030)
Urine Glucose: NEGATIVE
Urobilinogen, UA: 0.2 (ref 0.0–1.0)
pH: 5.5 (ref 5.0–8.0)

## 2020-07-19 LAB — CBC WITH DIFFERENTIAL/PLATELET
Basophils Absolute: 0.1 10*3/uL (ref 0.0–0.1)
Basophils Relative: 0.9 % (ref 0.0–3.0)
Eosinophils Absolute: 0.6 10*3/uL (ref 0.0–0.7)
Eosinophils Relative: 6.3 % — ABNORMAL HIGH (ref 0.0–5.0)
HCT: 41 % (ref 39.0–52.0)
Hemoglobin: 13.6 g/dL (ref 13.0–17.0)
Lymphocytes Relative: 36.2 % (ref 12.0–46.0)
Lymphs Abs: 3.4 10*3/uL (ref 0.7–4.0)
MCHC: 33.1 g/dL (ref 30.0–36.0)
MCV: 91.5 fl (ref 78.0–100.0)
Monocytes Absolute: 0.7 10*3/uL (ref 0.1–1.0)
Monocytes Relative: 7 % (ref 3.0–12.0)
Neutro Abs: 4.7 10*3/uL (ref 1.4–7.7)
Neutrophils Relative %: 49.6 % (ref 43.0–77.0)
Platelets: 221 10*3/uL (ref 150.0–400.0)
RBC: 4.48 Mil/uL (ref 4.22–5.81)
RDW: 15.7 % — ABNORMAL HIGH (ref 11.5–15.5)
WBC: 9.4 10*3/uL (ref 4.0–10.5)

## 2020-07-19 LAB — BASIC METABOLIC PANEL
BUN: 33 mg/dL — ABNORMAL HIGH (ref 6–23)
CO2: 26 mEq/L (ref 19–32)
Calcium: 9.6 mg/dL (ref 8.4–10.5)
Chloride: 109 mEq/L (ref 96–112)
Creatinine, Ser: 1.36 mg/dL (ref 0.40–1.50)
GFR: 46.69 mL/min — ABNORMAL LOW (ref >60.00)
Glucose, Bld: 108 mg/dL — ABNORMAL HIGH (ref 70–99)
Potassium: 4.9 mEq/L (ref 3.5–5.1)
Sodium: 144 mEq/L (ref 135–145)

## 2020-07-19 LAB — HEPATIC FUNCTION PANEL
ALT: 17 U/L (ref 0–53)
AST: 27 U/L (ref 0–37)
Albumin: 4.1 g/dL (ref 3.5–5.2)
Alkaline Phosphatase: 83 U/L (ref 39–117)
Bilirubin, Direct: 0.1 mg/dL (ref 0.0–0.3)
Total Bilirubin: 0.4 mg/dL (ref 0.2–1.2)
Total Protein: 6.9 g/dL (ref 6.0–8.3)

## 2020-07-19 LAB — LIPID PANEL
Cholesterol: 137 mg/dL (ref 0–200)
HDL: 51.6 mg/dL (ref >39.00)
LDL Cholesterol: 71 mg/dL (ref 0–99)
NonHDL: 85.85
Total CHOL/HDL Ratio: 3
Triglycerides: 72 mg/dL (ref 0.0–149.0)
VLDL: 14.4 mg/dL (ref 0.0–40.0)

## 2020-07-19 LAB — TSH: TSH: 5.77 u[IU]/mL — ABNORMAL HIGH (ref 0.35–4.50)

## 2020-07-19 LAB — PHOSPHORUS: Phosphorus: 4.1 mg/dL (ref 2.3–4.6)

## 2020-07-19 LAB — VITAMIN D 25 HYDROXY (VIT D DEFICIENCY, FRACTURES): VITD: 33.19 ng/mL (ref 30.00–100.00)

## 2020-07-21 LAB — PTH, INTACT AND CALCIUM
Calcium: 9.8 mg/dL (ref 8.6–10.3)
PTH: 41 pg/mL (ref 16–77)

## 2020-07-23 ENCOUNTER — Ambulatory Visit: Payer: Medicare PPO | Admitting: Internal Medicine

## 2020-07-23 DIAGNOSIS — Z950 Presence of cardiac pacemaker: Secondary | ICD-10-CM | POA: Diagnosis not present

## 2020-07-23 DIAGNOSIS — I442 Atrioventricular block, complete: Secondary | ICD-10-CM | POA: Diagnosis not present

## 2020-07-26 ENCOUNTER — Ambulatory Visit: Payer: Medicare PPO | Admitting: Internal Medicine

## 2020-07-28 ENCOUNTER — Ambulatory Visit (INDEPENDENT_AMBULATORY_CARE_PROVIDER_SITE_OTHER): Payer: Medicare PPO | Admitting: Internal Medicine

## 2020-07-28 ENCOUNTER — Other Ambulatory Visit: Payer: Self-pay

## 2020-07-28 VITALS — BP 100/66 | HR 59 | Temp 98.1°F | Ht 68.0 in | Wt 132.0 lb

## 2020-07-28 DIAGNOSIS — E039 Hypothyroidism, unspecified: Secondary | ICD-10-CM

## 2020-07-28 DIAGNOSIS — Z0001 Encounter for general adult medical examination with abnormal findings: Secondary | ICD-10-CM

## 2020-07-28 DIAGNOSIS — Z0000 Encounter for general adult medical examination without abnormal findings: Secondary | ICD-10-CM

## 2020-07-28 DIAGNOSIS — R739 Hyperglycemia, unspecified: Secondary | ICD-10-CM | POA: Diagnosis not present

## 2020-07-28 DIAGNOSIS — E7800 Pure hypercholesterolemia, unspecified: Secondary | ICD-10-CM | POA: Diagnosis not present

## 2020-07-28 DIAGNOSIS — I959 Hypotension, unspecified: Secondary | ICD-10-CM | POA: Diagnosis not present

## 2020-07-28 NOTE — Progress Notes (Signed)
Patient ID: Kristopher [name], male   DOB: 01-19-1931, 87 y.o.   MRN: [id number]         Chief Complaint:: wellness exam and Follow-up  low blood pressure, and low thyroid       HPI:  Kristopher [name] is a 87 y.o. male here for wellness exam; up to date with preventive referrals and immunizations                        Also c/o general weakness and fatigue, but Pt denies chest pain, increased sob or doe, wheezing, orthopnea, PND, increased LE swelling, palpitations, dizziness or syncope.   Pt denies polydipsia, polyuria, denies new neuro focal s/s.   Pt denies fever, wt loss, night sweats, loss of appetite, or other constitutional symptoms  Has no new complaints.  Does not want to make additions to tx today   Wt Readings from Last 3 Encounters:  07/28/20 132 lb (59.9 kg)  03/30/20 135 lb (61.2 kg)  02/26/20 135 lb (61.2 kg)   BP Readings from Last 3 Encounters:  07/28/20 100/66  03/30/20 (!) 142/70  02/26/20 (!) 144/90   Immunization History  Administered Date(s) Administered  . PFIZER(Purple Top)SARS-COV-2 Vaccination 08/06/2019, 08/26/2019, 07/07/2020  . Pneumococcal Conjugate-13 01/15/2014  . Pneumococcal Polysaccharide-23 01/26/2016  . Tdap 11/30/2017  . Zoster 09/16/2012  There are no preventive care reminders to display for this patient.    Past Medical History:  Diagnosis Date  . AAA (abdominal aortic aneurysm) ([place])    ultrasound 07/2011:  2.7 x 2.8 cm  . Acute myocardial infarction, unspecified site, episode of care unspecified 1998  . Aortic stenosis    echo 4/12: normal LVF, mild LAE, no significant AS  . Arthritis    "all over" (05/21/2017)  . CAD (coronary artery disease)    s/p PCI of LAD 1998;  s/p DES to RCA 05/2009;  Myoview 4/13: low risk, no ischemia, EF 56%  . CAP (community acquired pneumonia) 05/21/2017  . COPD (chronic obstructive pulmonary disease) ([place])   . DJD (degenerative joint disease), lumbar 07/06/2011  . Esophageal reflux   . HLD (hyperlipidemia)    . HTN (hypertension)   . PAD (peripheral artery disease) ([place])    s/p L CIA stent 09/2011 ([place])  . Pneumonia    "I've always had pneumonia; since I was 37 months old" (05/21/2017)  . Renal artery stenosis ([facility])    s/p bilat RA stents  . Urine incontinence    Past Surgical History:  Procedure Laterality Date  . CATARACT EXTRACTION W/ INTRAOCULAR LENS  IMPLANT, BILATERAL Bilateral YRS AGO  . CORONARY ANGIOPLASTY WITH STENT PLACEMENT  05/1996   PCI of LAD; "I've got a total of 4 stents" (05/21/2017)  . [place]  . LOWER EXTREMITY ANGIOGRAM Bilateral 09/20/2011   Procedure: LOWER EXTREMITY ANGIOGRAM;  Surgeon: Wellington Hampshire, MD;  Location: [facility];  Service: Cardiovascular;  Laterality: Bilateral;  . PACEMAKER IMPLANT N/A 12/17/2019   Procedure: PACEMAKER IMPLANT;  Surgeon: Vickie Epley, MD;  Location: [facility];  Service: Cardiovascular;  Laterality: N/A;  . TRANSURETHRAL RESECTION OF PROSTATE  08/11/2011   Procedure: TRANSURETHRAL RESECTION OF THE PROSTATE WITH GYRUS INSTRUMENTS;  Surgeon: Fredricka Bonine, MD;  Location: [facility];  Service: Urology;  Laterality: N/A;     . VASECTOMY  1968    reports that he quit smoking about 32 years ago. He has a 114.00 pack-year smoking history.  He has never used smokeless tobacco. He reports current alcohol use. He reports that he does not use drugs. family history includes Heart disease in his father and mother. Allergies  Allergen Reactions  . Influenza Vaccines Other (See Comments)    unknown  . Influenza Virus Vaccine Other (See Comments)    Several reaction.  Took it once woke up two days later in the hospital.   . Sulfa Antibiotics Rash   Current Outpatient Medications on File Prior to Visit  Medication Sig Dispense Refill  . amLODipine (NORVASC) 5 MG tablet Take 1 tablet (5 mg total) by mouth daily. 90 tablet 3  . Ascorbic Acid (VITAMIN C) 500 MG tablet Take 500 mg by mouth 2 (two) times daily.     Marland Kitchen aspirin 81 MG tablet Take 1 tablet (81 mg total) by mouth daily.    Marland Kitchen BEE POLLEN PO Take 1 capsule by mouth 2 (two) times daily.     Marland Kitchen COVID-19 mRNA vaccine, Pfizer, 30 MCG/0.3ML injection     . cyclobenzaprine (FLEXERIL) 5 MG tablet TAKE 1 TABLET(5 MG) BY MOUTH AT BEDTIME AS NEEDED FOR MUSCLE SPASMS (Patient taking differently: Take 5 mg by mouth at bedtime as needed for muscle spasms.) 30 tablet 2  . diclofenac sodium (VOLTAREN) 1 % GEL APPLY 2 GRAMS TOPICALLY FOUR TIMES A DAY AS NEEDED. (Patient taking differently: Apply 2 g topically 4 (four) times daily as needed (joint pain).) 700 g 4  . Docusate Calcium (STOOL SOFTENER PO) Take 2 capsules by mouth daily.    . Multiple Vitamin (MULTIVITAMIN) capsule Take 1 capsule by mouth daily.    . Multiple Vitamins-Minerals (PRESERVISION AREDS PO) Take 2 tablets by mouth daily.    . nitroGLYCERIN (NITROSTAT) 0.4 MG SL tablet Place 1 tablet (0.4 mg total) under the tongue every 5 (five) minutes as needed for chest pain. 90 tablet 3  . OVER THE COUNTER MEDICATION Take 2 capsules by mouth every morning. NITRIC OXIDE    . pantoprazole (PROTONIX) 40 MG tablet Take 1 tablet (40 mg total) by mouth 2 (two) times daily. 180 tablet 3  . rosuvastatin (CRESTOR) 40 MG tablet TAKE 1 TABLET DAILY 90 tablet 3   No current facility-administered medications on file prior to visit.        ROS:  All others reviewed and negative.  Objective        PE:  BP 100/66 (BP Location: Left Arm, Patient Position: Sitting, Cuff Size: Normal)   Pulse (!) 59   Temp 98.1 F (36.7 C) (Oral)   Ht 5\' 8"  (1.727 m)   Wt 132 lb (59.9 kg)   SpO2 98%   BMI 20.07 kg/m                 Constitutional: Pt appears in NAD               HENT: Head: NCAT.                Right Ear: External ear normal.                 Left Ear: External ear normal.                Eyes: . Pupils are equal, round, and reactive to light. Conjunctivae and EOM are normal               Nose: without d/c or  deformity  Neck: Neck supple. Gross normal ROM               Cardiovascular: Normal rate and regular rhythm.                 Pulmonary/Chest: Effort normal and breath sounds without rales or wheezing.                Abd:  Soft, NT, ND, + BS, no organomegaly               Neurological: Pt is alert. At baseline orientation, motor grossly intact               Skin: Skin is warm. No rashes, no other new lesions, LE edema - none               Psychiatric: Pt behavior is normal without agitation   Micro: none  Cardiac tracings I have personally interpreted today:  none  Pertinent Radiological findings (summarize): none   Lab Results  Component Value Date   WBC 9.4 07/19/2020   HGB 13.6 07/19/2020   HCT 41.0 07/19/2020   PLT 221.0 07/19/2020   GLUCOSE 108 (H) 07/19/2020   CHOL 137 07/19/2020   TRIG 72.0 07/19/2020   HDL 51.60 07/19/2020   LDLCALC 71 07/19/2020   ALT 17 07/19/2020   AST 27 07/19/2020   NA 144 07/19/2020   K 4.9 07/19/2020   CL 109 07/19/2020   CREATININE 1.36 07/19/2020   BUN 33 (H) 07/19/2020   CO2 26 07/19/2020   TSH 5.77 (H) 07/19/2020   INR 1.01 08/31/2013   HGBA1C 6.6 (H) 09/20/2018   Assessment/Plan:  Kristopher [name] is a 87 y.o. White or Caucasian [1] male with  has a past medical history of AAA (abdominal aortic aneurysm) ([place]), Acute myocardial infarction, unspecified site, episode of care unspecified (1998), Aortic stenosis, Arthritis, CAD (coronary artery disease), CAP (community acquired pneumonia) (05/21/2017), COPD (chronic obstructive pulmonary disease) ([place]), DJD (degenerative joint disease), lumbar (07/06/2011), Esophageal reflux, HLD (hyperlipidemia), HTN (hypertension), PAD (peripheral artery disease) ([place]), Pneumonia, Renal artery stenosis ([place]), and Urine incontinence.  Encounter for well adult exam with abnormal findings Age and sex appropriate education and counseling updated with regular exercise and diet Referrals for  preventative services - none needed Immunizations addressed - none needed Smoking counseling  - none needed Evidence for depression or other mood disorder - none significant Most recent labs reviewed. I have personally reviewed and have noted: 1) the patient's medical and social history 2) The patient's current medications and supplements 3) The patient's height, weight, and BMI have been recorded in the chart   Hypotension Suspect BP overcontrolled - to d/c amlodipine and fu BP at home and next visit  Hypothyroidism Mild, pt asked to start levothryoxine 25 but declines  Hyperglycemia Lab Results  Component Value Date   HGBA1C 6.6 (H) 09/20/2018   Stable, pt to continue current medical treatment - diet   Hyperlipidemia Lab Results  Component Value Date   [place] 71 07/19/2020   Stable, pt to continue current statin crestor  Followup: Return in about 6 months (around 01/27/2021).  [name], MD 08/01/2020 6:44 PM [facility]

## 2020-07-28 NOTE — Patient Instructions (Addendum)
Ok to stop the amlodipine  Please call if you change your mind about taking the very small dose of thyroid medicaiton  Please continue all other medications as before, and refills have been done if requested.  Please have the pharmacy call with any other refills you may need.  Please continue your efforts at being more active, low cholesterol diet, and weight control.  You are otherwise up to date with prevention measures today.  Please keep your appointments with your specialists as you may have planned  Please make an Appointment to return in 6 months, or sooner if needed

## 2020-08-01 ENCOUNTER — Encounter: Payer: Self-pay | Admitting: Internal Medicine

## 2020-08-01 DIAGNOSIS — E039 Hypothyroidism, unspecified: Secondary | ICD-10-CM | POA: Insufficient documentation

## 2020-08-01 DIAGNOSIS — I959 Hypotension, unspecified: Secondary | ICD-10-CM | POA: Insufficient documentation

## 2020-08-01 NOTE — Assessment & Plan Note (Signed)
Lab Results  Component Value Date   HGBA1C 6.6 (H) 09/20/2018   Stable, pt to continue current medical treatment - diet

## 2020-08-01 NOTE — Assessment & Plan Note (Signed)

## 2020-08-01 NOTE — Assessment & Plan Note (Signed)
Mild, pt asked to start levothryoxine 25 but declines

## 2020-08-01 NOTE — Assessment & Plan Note (Signed)
Suspect BP overcontrolled - to d/c amlodipine and fu BP at home and next visit

## 2020-08-01 NOTE — Assessment & Plan Note (Signed)
Lab Results  Component Value Date   LDLCALC 71 07/19/2020   Stable, pt to continue current statin crestor

## 2020-08-11 ENCOUNTER — Ambulatory Visit: Payer: Medicare PPO

## 2020-08-16 DIAGNOSIS — I442 Atrioventricular block, complete: Secondary | ICD-10-CM | POA: Diagnosis not present

## 2020-08-16 DIAGNOSIS — Z950 Presence of cardiac pacemaker: Secondary | ICD-10-CM | POA: Diagnosis not present

## 2020-08-19 ENCOUNTER — Ambulatory Visit: Payer: Medicare PPO

## 2020-08-22 DIAGNOSIS — I442 Atrioventricular block, complete: Secondary | ICD-10-CM | POA: Diagnosis not present

## 2020-08-22 DIAGNOSIS — Z950 Presence of cardiac pacemaker: Secondary | ICD-10-CM | POA: Diagnosis not present

## 2020-08-31 ENCOUNTER — Other Ambulatory Visit: Payer: Self-pay

## 2020-08-31 ENCOUNTER — Ambulatory Visit (INDEPENDENT_AMBULATORY_CARE_PROVIDER_SITE_OTHER): Payer: Medicare PPO

## 2020-08-31 VITALS — BP 122/70 | HR 91 | Temp 97.7°F | Ht 68.0 in | Wt 130.0 lb

## 2020-08-31 DIAGNOSIS — Z0000 Encounter for general adult medical examination without abnormal findings: Secondary | ICD-10-CM | POA: Diagnosis not present

## 2020-08-31 NOTE — Patient Instructions (Signed)
Kristopher [name] , Thank you for taking time to come for your Medicare Wellness Visit. I appreciate your ongoing commitment to your health goals. Please review the following plan we discussed and let me know if I can assist you in the future.   Screening recommendations/referrals: Colonoscopy: no repeat due to age Recommended yearly ophthalmology/optometry visit for glaucoma screening and checkup Recommended yearly dental visit for hygiene and checkup  Vaccinations: Influenza vaccine: allergy Pneumococcal vaccine: 01/15/2014, 01/26/2016 Tdap vaccine: 11/30/2017 due every 10 years Shingles vaccine:Please call your insurance company to determine your out of pocket expense for the Shingrix vaccine. You may receive this vaccine at your local pharmacy. Can check with [facility] Covid-19: 4/28/20221, 08/26/2019, 07/07/2020  Advanced directives: Please bring a copy of your health care power of attorney and living will to the office at your convenience.  Conditions/risks identified: Yes; Reviewed health maintenance screenings with patient today and relevant education, vaccines, and/or referrals were provided. Please continue to do your personal lifestyle choices by: daily care of teeth and gums, regular physical activity (goal should be 5 days a week for 30 minutes), eat a healthy diet, avoid tobacco and drug use, limiting any alcohol intake, taking a low-dose aspirin (if not allergic or have been advised by your provider otherwise) and taking vitamins and minerals as recommended by your provider. Continue doing brain stimulating activities (puzzles, reading, adult coloring books, staying active) to keep memory sharp. Continue to eat heart healthy diet (full of fruits, vegetables, whole grains, lean protein, water--limit salt, fat, and sugar intake) and increase physical activity as tolerated.  Next appointment: Please schedule your next Medicare Wellness Visit with your Nurse Health Advisor in 1 year  by calling [phone].  Preventive Care 85 Years and Older, Male Preventive care refers to lifestyle choices and visits with your health care provider that can promote health and wellness. What does preventive care include?  A yearly physical exam. This is also called an annual well check.  Dental exams once or twice a year.  Routine eye exams. Ask your health care provider how often you should have your eyes checked.  Personal lifestyle choices, including:  Daily care of your teeth and gums.  Regular physical activity.  Eating a healthy diet.  Avoiding tobacco and drug use.  Limiting alcohol use.  Practicing safe sex.  Taking low doses of aspirin every day.  Taking vitamin and mineral supplements as recommended by your health care provider. What happens during an annual well check? The services and screenings done by your health care provider during your annual well check will depend on your age, overall health, lifestyle risk factors, and family history of disease. Counseling  Your health care provider may ask you questions about your:  Alcohol use.  Tobacco use.  Drug use.  Emotional well-being.  Home and relationship well-being.  Sexual activity.  Eating habits.  History of falls.  Memory and ability to understand (cognition).  Work and work Statistician. Screening  You may have the following tests or measurements:  Height, weight, and BMI.  Blood pressure.  Lipid and cholesterol levels. These may be checked every 5 years, or more frequently if you are over 85 years old.  Skin check.  Lung cancer screening. You may have this screening every year starting at age 85 if you have a 30-pack-year history of smoking and currently smoke or have quit within the past 15 years.  Fecal occult blood test (FOBT) of the stool. You may have this test every  year starting at age 85.  Flexible sigmoidoscopy or colonoscopy. You may have a sigmoidoscopy every 5  years or a colonoscopy every 10 years starting at age 85.  Prostate cancer screening. Recommendations will vary depending on your family history and other risks.  Hepatitis C blood test.  Hepatitis B blood test.  Sexually transmitted disease (STD) testing.  Diabetes screening. This is done by checking your blood sugar (glucose) after you have not eaten for a while (fasting). You may have this done every 1-3 years.  Abdominal aortic aneurysm (AAA) screening. You may need this if you are a current or former smoker.  Osteoporosis. You may be screened starting at age 85 if you are at high risk. Talk with your health care provider about your test results, treatment options, and if necessary, the need for more tests. Vaccines  Your health care provider may recommend certain vaccines, such as:  Influenza vaccine. This is recommended every year.  Tetanus, diphtheria, and acellular pertussis (Tdap, Td) vaccine. You may need a Td booster every 10 years.  Zoster vaccine. You may need this after age 85.  Pneumococcal 13-valent conjugate (PCV13) vaccine. One dose is recommended after age 85.  Pneumococcal polysaccharide (PPSV23) vaccine. One dose is recommended after age 85. Talk to your health care provider about which screenings and vaccines you need and how often you need them. This information is not intended to replace advice given to you by your health care provider. Make sure you discuss any questions you have with your health care provider. Document Released: 04/23/2015 Document Revised: 12/15/2015 Document Reviewed: 01/26/2015 Elsevier Interactive Patient Education  2017 [place] Prevention in the Home Falls can cause injuries. They can happen to people of all ages. There are many things you can do to make your home safe and to help prevent falls. What can I do on the outside of my home?  Regularly fix the edges of walkways and driveways and fix any cracks.  Remove  anything that might make you trip as you walk through a door, such as a raised step or threshold.  Trim any bushes or trees on the path to your home.  Use bright outdoor lighting.  Clear any walking paths of anything that might make someone trip, such as rocks or tools.  Regularly check to see if handrails are loose or broken. Make sure that both sides of any steps have handrails.  Any raised decks and porches should have guardrails on the edges.  Have any leaves, snow, or ice cleared regularly.  Use sand or salt on walking paths during winter.  Clean up any spills in your garage right away. This includes oil or grease spills. What can I do in the bathroom?  Use night lights.  Install grab bars by the toilet and in the tub and shower. Do not use towel bars as grab bars.  Use non-skid mats or decals in the tub or shower.  If you need to sit down in the shower, use a plastic, non-slip stool.  Keep the floor dry. Clean up any water that spills on the floor as soon as it happens.  Remove soap buildup in the tub or shower regularly.  Attach bath mats securely with double-sided non-slip rug tape.  Do not have throw rugs and other things on the floor that can make you trip. What can I do in the bedroom?  Use night lights.  Make sure that you have a light by your bed that  is easy to reach.  Do not use any sheets or blankets that are too big for your bed. They should not hang down onto the floor.  Have a firm chair that has side arms. You can use this for support while you get dressed.  Do not have throw rugs and other things on the floor that can make you trip. What can I do in the kitchen?  Clean up any spills right away.  Avoid walking on wet floors.  Keep items that you use a lot in easy-to-reach places.  If you need to reach something above you, use a strong step stool that has a grab bar.  Keep electrical cords out of the way.  Do not use floor polish or wax that  makes floors slippery. If you must use wax, use non-skid floor wax.  Do not have throw rugs and other things on the floor that can make you trip. What can I do with my stairs?  Do not leave any items on the stairs.  Make sure that there are handrails on both sides of the stairs and use them. Fix handrails that are broken or loose. Make sure that handrails are as long as the stairways.  Check any carpeting to make sure that it is firmly attached to the stairs. Fix any carpet that is loose or worn.  Avoid having throw rugs at the top or bottom of the stairs. If you do have throw rugs, attach them to the floor with carpet tape.  Make sure that you have a light switch at the top of the stairs and the bottom of the stairs. If you do not have them, ask someone to add them for you. What else can I do to help prevent falls?  Wear shoes that:  Do not have high heels.  Have rubber bottoms.  Are comfortable and fit you well.  Are closed at the toe. Do not wear sandals.  If you use a stepladder:  Make sure that it is fully opened. Do not climb a closed stepladder.  Make sure that both sides of the stepladder are locked into place.  Ask someone to hold it for you, if possible.  Clearly mark and make sure that you can see:  Any grab bars or handrails.  First and last steps.  Where the edge of each step is.  Use tools that help you move around (mobility aids) if they are needed. These include:  Canes.  Walkers.  Scooters.  Crutches.  Turn on the lights when you go into a dark area. Replace any light bulbs as soon as they burn out.  Set up your furniture so you have a clear path. Avoid moving your furniture around.  If any of your floors are uneven, fix them.  If there are any pets around you, be aware of where they are.  Review your medicines with your doctor. Some medicines can make you feel dizzy. This can increase your chance of falling. Ask your doctor what other  things that you can do to help prevent falls. This information is not intended to replace advice given to you by your health care provider. Make sure you discuss any questions you have with your health care provider. Document Released: 01/21/2009 Document Revised: 09/02/2015 Document Reviewed: 05/01/2014 Elsevier Interactive Patient Education  2017 [employer].

## 2020-08-31 NOTE — Progress Notes (Signed)
Subjective:   Kristopher [name] is a 87 y.o. male who presents for Medicare Annual/Subsequent preventive examination.  Review of Systems    No ROS. Medicare Wellness Visit. Additional risk factors are reflected in social history. Cardiac Risk Factors include: advanced age (>7men, >61 women);dyslipidemia;family history of premature cardiovascular disease;hypertension;male gender     Objective:    Today's Vitals   08/31/20 1258  BP: 122/70  Pulse: 91  Temp: 97.7 F (36.5 C)  SpO2: 94%  Weight: 130 lb (59 kg)  Height: 5\' 8"  (1.727 m)  PainSc: 0-No pain   Body mass index is 19.77 kg/m.  Advanced Directives 08/31/2020 12/17/2019 12/17/2019 05/21/2017 05/21/2017 01/25/2017 12/26/2016  Does Patient Have a Medical Advance Directive? Yes No No No No No No  Type of Advance Directive Living will - - - - - -  Does patient want to make changes to medical advance directive? No - Patient declined - - - - - -  Would patient like information on creating a medical advance directive? - No - Patient declined No - Patient declined No - Patient declined - - No - Patient declined  Pre-existing out of facility DNR order (yellow form or pink MOST form) - - - - - - -    Current Medications (verified) Outpatient Encounter Medications as of 08/31/2020  Medication Sig  . amLODipine (NORVASC) 5 MG tablet Take 1 tablet (5 mg total) by mouth daily.  . Ascorbic Acid (VITAMIN C) 500 MG tablet Take 500 mg by mouth 2 (two) times daily.  Marland Kitchen aspirin 81 MG tablet Take 1 tablet (81 mg total) by mouth daily.  Marland Kitchen BEE POLLEN PO Take 1 capsule by mouth 2 (two) times daily.   Marland Kitchen COVID-19 mRNA vaccine, Pfizer, 30 MCG/0.3ML injection   . cyclobenzaprine (FLEXERIL) 5 MG tablet TAKE 1 TABLET(5 MG) BY MOUTH AT BEDTIME AS NEEDED FOR MUSCLE SPASMS (Patient taking differently: Take 5 mg by mouth at bedtime as needed for muscle spasms.)  . diclofenac sodium (VOLTAREN) 1 % GEL APPLY 2 GRAMS TOPICALLY FOUR TIMES A DAY AS NEEDED. (Patient  taking differently: Apply 2 g topically 4 (four) times daily as needed (joint pain).)  . Docusate Calcium (STOOL SOFTENER PO) Take 2 capsules by mouth daily.  . Multiple Vitamin (MULTIVITAMIN) capsule Take 1 capsule by mouth daily.  . Multiple Vitamins-Minerals (PRESERVISION AREDS PO) Take 2 tablets by mouth daily.  . nitroGLYCERIN (NITROSTAT) 0.4 MG SL tablet Place 1 tablet (0.4 mg total) under the tongue every 5 (five) minutes as needed for chest pain.  Marland Kitchen OVER THE COUNTER MEDICATION Take 2 capsules by mouth every morning. NITRIC OXIDE  . pantoprazole (PROTONIX) 40 MG tablet Take 1 tablet (40 mg total) by mouth 2 (two) times daily.  . rosuvastatin (CRESTOR) 40 MG tablet TAKE 1 TABLET DAILY   No facility-administered encounter medications on file as of 08/31/2020.    Allergies (verified) Influenza vaccines, Influenza virus vaccine, and Sulfa antibiotics   History: Past Medical History:  Diagnosis Date  . AAA (abdominal aortic aneurysm) ([place])    ultrasound 07/2011:  2.7 x 2.8 cm  . Acute myocardial infarction, unspecified site, episode of care unspecified 1998  . Aortic stenosis    echo 4/12: normal LVF, mild LAE, no significant AS  . Arthritis    "all over" (05/21/2017)  . CAD (coronary artery disease)    s/p PCI of LAD 1998;  s/p DES to RCA 05/2009;  Myoview 4/13: low risk, no ischemia, EF 56%  .  CAP (community acquired pneumonia) 05/21/2017  . COPD (chronic obstructive pulmonary disease) ([place])   . DJD (degenerative joint disease), lumbar 07/06/2011  . Esophageal reflux   . HLD (hyperlipidemia)   . HTN (hypertension)   . PAD (peripheral artery disease) ([place])    s/p L CIA stent 09/2011 ([place])  . Pneumonia    "I've always had pneumonia; since I was 55 months old" (05/21/2017)  . Renal artery stenosis ([facility])    s/p bilat RA stents  . Urine incontinence    Past Surgical History:  Procedure Laterality Date  . CATARACT EXTRACTION W/ INTRAOCULAR LENS  IMPLANT, BILATERAL Bilateral YRS AGO   . CORONARY ANGIOPLASTY WITH STENT PLACEMENT  05/1996   PCI of LAD; "I've got a total of 4 stents" (05/21/2017)  . [place]  . LOWER EXTREMITY ANGIOGRAM Bilateral 09/20/2011   Procedure: LOWER EXTREMITY ANGIOGRAM;  Surgeon: Wellington Hampshire, MD;  Location: [facility];  Service: Cardiovascular;  Laterality: Bilateral;  . PACEMAKER IMPLANT N/A 12/17/2019   Procedure: PACEMAKER IMPLANT;  Surgeon: Vickie Epley, MD;  Location: [facility];  Service: Cardiovascular;  Laterality: N/A;  . TRANSURETHRAL RESECTION OF PROSTATE  08/11/2011   Procedure: TRANSURETHRAL RESECTION OF THE PROSTATE WITH GYRUS INSTRUMENTS;  Surgeon: Fredricka Bonine, MD;  Location: [facility];  Service: Urology;  Laterality: N/A;     . VASECTOMY  1968   Family History  Problem Relation Age of Onset  . Heart disease Mother   . Heart disease Father    Social History   Socioeconomic History  . Marital status: Widowed    Spouse name: Not on file  . Number of children: Not on file  . Years of education: GED  . Highest education level: Not on file  Occupational History  . Occupation: retired  Tobacco Use  . Smoking status: Former Smoker    Packs/day: 3.00    Years: 38.00    Pack years: 114.00    Quit date: 04/10/1988    Years since quitting: 32.4  . Smokeless tobacco: Never Used  Vaping Use  . Vaping Use: Never used  Substance and Sexual Activity  . Alcohol use: Yes    Comment: 05/21/2017 "might drink 6 beers/year"  . Drug use: No  . Sexual activity: Not on file  Other Topics Concern  . Not on file  Social History Narrative  . Not on file   Social Determinants of Health   Financial Resource Strain: Low Risk   . Difficulty of Paying Living Expenses: Not hard at all  Food Insecurity: No Food Insecurity  . Worried About Charity fundraiser in the Last Year: Never true  . Ran Out of Food in the Last Year: Never true  Transportation Needs: No Transportation Needs  . Lack of  Transportation (Medical): No  . Lack of Transportation (Non-Medical): No  Physical Activity: Sufficiently Active  . Days of Exercise per Week: 5 days  . Minutes of Exercise per Session: 30 min  Stress: No Stress Concern Present  . Feeling of Stress : Not at all  Social Connections: Moderately Integrated  . Frequency of Communication with Friends and Family: More than three times a week  . Frequency of Social Gatherings with Friends and Family: More than three times a week  . Attends Religious Services: 1 to 4 times per year  . Active Member of Clubs or Organizations: No  . Attends Archivist Meetings: 1 to 4 times per year  .  Marital Status: Widowed    Tobacco Counseling Counseling given: Not Answered   Clinical Intake:  Pre-visit preparation completed: Yes  Pain : No/denies pain Pain Score: 0-No pain     BMI - recorded: 19.77 Nutritional Status: BMI of 19-24  Normal Nutritional Risks: None Diabetes: No  How often do you need to have someone help you when you read instructions, pamphlets, or other written materials from your doctor or pharmacy?: 1 - Never What is the last grade level you completed in school?: GED  Diabetic? no  Interpreter Needed?: No  Information entered by :: Lisette Abu, LPN   Activities of Daily Living In your present state of health, do you have any difficulty performing the following activities: 08/31/2020 12/17/2019  Hearing? N N  Vision? N N  Difficulty concentrating or making decisions? N N  Walking or climbing stairs? N N  Dressing or bathing? N N  Doing errands, shopping? N N  Preparing Food and eating ? N -  Using the Toilet? N -  In the past six months, have you accidently leaked urine? N -  Do you have problems with loss of bowel control? N -  Managing your Medications? N -  Managing your Finances? N -  Housekeeping or managing your Housekeeping? N -  Some recent data might be hidden    Patient Care Team: [name], MD as PCP - General (Internal Medicine) Stanford Breed Denice Bors, MD as PCP - Cardiology (Cardiology) Vickie Epley, MD as PCP - Electrophysiology (Cardiology) Stanford Breed Denice Bors, MD as Consulting Physician (Cardiology) Calvert Cantor, MD as Consulting Physician (Ophthalmology) Letta Pate Luanna Salk, MD as Consulting Physician (Pain Medicine)  Indicate any recent Medical Services you may have received from other than [facility] providers in the past year (date may be approximate).     Assessment:   This is a routine wellness examination for Prateek.  Hearing/Vision screen No exam data present  Dietary issues and exercise activities discussed: Current Exercise Habits: Home exercise routine, Type of exercise: walking;Other - see comments (yard & house work), Time (Minutes): 30, Frequency (Times/Week): 5, Weekly Exercise (Minutes/Week): 150, Intensity: Mild, Exercise limited by: cardiac condition(s);respiratory conditions(s);orthopedic condition(s)  Goals Addressed   None    Depression Screen PHQ 2/9 Scores 08/31/2020 07/28/2020 07/28/2020 01/19/2020 12/12/2019 09/20/2018 09/12/2018  PHQ - 2 Score 0 0 0 0 0 0 0  PHQ- 9 Score - - - - - - -    Fall Risk Fall Risk  08/31/2020 07/28/2020 07/28/2020 02/26/2020 01/19/2020  Falls in the past year? 0 0 0 0 0  Comment - - - - -  Number falls in past yr: 0 - 0 - -  Comment - - - - -  Injury with Fall? 0 - 0 - -  Comment - - - - -  Risk for fall due to : No Fall Risks - No Fall Risks - -  Follow up Falls evaluation completed - - - -  Comment - - - - -    FALL RISK PREVENTION PERTAINING TO THE HOME:  Any stairs in or around the home? Yes  If so, are there any without handrails? No  Home free of loose throw rugs in walkways, pet beds, electrical cords, etc? Yes  Adequate lighting in your home to reduce risk of falls? Yes   ASSISTIVE DEVICES UTILIZED TO PREVENT FALLS:  Life alert? No  Use of a cane, walker or w/c? Yes  Grab bars in the bathroom?  No  Shower chair or bench in shower? Yes  Elevated toilet seat or a handicapped toilet? No   TIMED UP AND GO:  Was the test performed? No .  Length of time to ambulate 10 feet: 0 sec.   Gait steady and fast with assistive device  Cognitive Function: Normal cognitive status assessed by direct observation by this Nurse Health Advisor. No abnormalities found.   MMSE - Mini Mental State Exam 05/05/2016  Orientation to time 5  Orientation to Place 5  Registration 3  Attention/ Calculation 5  Recall 2  Language- name 2 objects 2  Language- repeat 1  Language- follow 3 step command 3  Language- read & follow direction 1  Write a sentence 1  Copy design 1  Total score 29        Immunizations Immunization History  Administered Date(s) Administered  . PFIZER(Purple Top)SARS-COV-2 Vaccination 08/06/2019, 08/26/2019, 07/07/2020  . Pneumococcal Conjugate-13 01/15/2014  . Pneumococcal Polysaccharide-23 01/26/2016  . Tdap 11/30/2017  . Zoster 09/16/2012    TDAP status: Up to date  Flu Vaccine status: Declined, Education has been provided regarding the importance of this vaccine but patient still declined. Advised may receive this vaccine at local pharmacy or Health Dept. Aware to provide a copy of the vaccination record if obtained from local pharmacy or Health Dept. Verbalized acceptance and understanding.  Pneumococcal vaccine status: Up to date  Covid-19 vaccine status: Completed vaccines  Qualifies for Shingles Vaccine? Yes   Zostavax completed Yes   Shingrix Completed?: No.    Education has been provided regarding the importance of this vaccine. Patient has been advised to call insurance company to determine out of pocket expense if they have not yet received this vaccine. Advised may also receive vaccine at local pharmacy or Health Dept. Verbalized acceptance and understanding.  Screening Tests Health Maintenance  Topic Date Due  . COVID-19 Vaccine (4 - Booster for Pfizer  series) 10/07/2020  . TETANUS/TDAP  12/01/2027  . PNA vac Low Risk Adult  Completed  . HPV VACCINES  Aged Out    Health Maintenance  There are no preventive care reminders to display for this patient.  Colorectal cancer screening: No longer required.   Lung Cancer Screening: (Low Dose CT Chest recommended if Age 19-80 years, 30 pack-year currently smoking OR have quit w/in 15years.) does not qualify.   Lung Cancer Screening Referral: no  Additional Screening:  Hepatitis C Screening: does not qualify; Completed no  Vision Screening: Recommended annual ophthalmology exams for early detection of glaucoma and other disorders of the eye. Is the patient up to date with their annual eye exam?  Yes  Who is the provider or what is the name of the office in which the patient attends annual eye exams? [facility] If pt is not established with a provider, would they like to be referred to a provider to establish care? No .   Dental Screening: Recommended annual dental exams for proper oral hygiene  Community Resource Referral / Chronic Care Management: CRR required this visit?  No   CCM required this visit?  No      Plan:     I have personally reviewed and noted the following in the patient's chart:   . Medical and social history . Use of alcohol, tobacco or illicit drugs  . Current medications and supplements including opioid prescriptions. Patient is not currently taking opioid prescriptions. . Functional ability and status . Nutritional status . Physical activity . Advanced  directives . List of other physicians . Hospitalizations, surgeries, and ER visits in previous 12 months . Vitals . Screenings to include cognitive, depression, and falls . Referrals and appointments  In addition, I have reviewed and discussed with patient certain preventive protocols, quality metrics, and best practice recommendations. A written personalized care plan for preventive services as  well as general preventive health recommendations were provided to patient.     Sheral Flow, LPN   [id number]   Nurse Notes: n/a

## 2020-09-01 ENCOUNTER — Telehealth: Payer: Self-pay | Admitting: Internal Medicine

## 2020-09-01 ENCOUNTER — Other Ambulatory Visit: Payer: Self-pay

## 2020-09-01 NOTE — Telephone Encounter (Signed)
Verified last AVS. Dr Jenny Reichmann stated its ok to stop. Per patient it has been removed from the med list. "Amplodipine"

## 2020-09-01 NOTE — Telephone Encounter (Signed)
Patient called and was wondering if amLODipine (NORVASC) 5 MG tablet could be taken off his med list. He said when he seen Dr. Jenny Reichmann on 07-28-20 he said that Dr. Jenny Reichmann took him off the medication. Please advise

## 2020-09-16 DIAGNOSIS — I442 Atrioventricular block, complete: Secondary | ICD-10-CM | POA: Diagnosis not present

## 2020-09-16 DIAGNOSIS — Z950 Presence of cardiac pacemaker: Secondary | ICD-10-CM | POA: Diagnosis not present

## 2020-09-17 ENCOUNTER — Ambulatory Visit (INDEPENDENT_AMBULATORY_CARE_PROVIDER_SITE_OTHER): Payer: Medicare PPO

## 2020-09-17 DIAGNOSIS — I442 Atrioventricular block, complete: Secondary | ICD-10-CM | POA: Diagnosis not present

## 2020-09-17 LAB — CUP PACEART REMOTE DEVICE CHECK
Battery Remaining Longevity: 156 mo
Battery Remaining Percentage: 100 %
Brady Statistic RA Percent Paced: 4 %
Brady Statistic RV Percent Paced: 100 %
Date Time Interrogation Session: 20220610010100
Implantable Lead Implant Date: 20210908
Implantable Lead Implant Date: 20210908
Implantable Lead Location: 753859
Implantable Lead Location: 753860
Implantable Lead Model: 7841
Implantable Lead Model: 7842
Implantable Lead Serial Number: 1054557
Implantable Lead Serial Number: 1096552
Implantable Pulse Generator Implant Date: 20210908
Lead Channel Impedance Value: 714 Ohm
Lead Channel Impedance Value: 936 Ohm
Lead Channel Pacing Threshold Amplitude: 0.6 V
Lead Channel Pacing Threshold Amplitude: 0.9 V
Lead Channel Pacing Threshold Pulse Width: 0.4 ms
Lead Channel Pacing Threshold Pulse Width: 0.4 ms
Lead Channel Setting Pacing Amplitude: 1.4 V
Lead Channel Setting Pacing Amplitude: 2.5 V
Lead Channel Setting Pacing Pulse Width: 0.4 ms
Lead Channel Setting Sensing Sensitivity: 3 mV
Pulse Gen Serial Number: 948694

## 2020-09-22 DIAGNOSIS — Z950 Presence of cardiac pacemaker: Secondary | ICD-10-CM | POA: Diagnosis not present

## 2020-09-22 DIAGNOSIS — I442 Atrioventricular block, complete: Secondary | ICD-10-CM | POA: Diagnosis not present

## 2020-10-08 NOTE — Progress Notes (Signed)
Remote pacemaker transmission.   

## 2020-10-16 DIAGNOSIS — I442 Atrioventricular block, complete: Secondary | ICD-10-CM | POA: Diagnosis not present

## 2020-10-16 DIAGNOSIS — Z950 Presence of cardiac pacemaker: Secondary | ICD-10-CM | POA: Diagnosis not present

## 2020-10-21 ENCOUNTER — Ambulatory Visit (HOSPITAL_COMMUNITY)
Admission: RE | Admit: 2020-10-21 | Discharge: 2020-10-21 | Disposition: A | Discharge status: Home / Self Care | Payer: Medicare PPO | Source: Ambulatory Visit | Attending: Cardiovascular Disease | Admitting: Cardiovascular Disease

## 2020-10-21 ENCOUNTER — Other Ambulatory Visit (HOSPITAL_COMMUNITY): Payer: Self-pay | Admitting: Cardiology

## 2020-10-21 ENCOUNTER — Other Ambulatory Visit: Payer: Self-pay

## 2020-10-21 DIAGNOSIS — I714 Abdominal aortic aneurysm, without rupture, unspecified: Secondary | ICD-10-CM

## 2020-10-22 ENCOUNTER — Encounter: Payer: Self-pay | Admitting: *Deleted

## 2020-10-22 DIAGNOSIS — I442 Atrioventricular block, complete: Secondary | ICD-10-CM | POA: Diagnosis not present

## 2020-10-22 DIAGNOSIS — Z950 Presence of cardiac pacemaker: Secondary | ICD-10-CM | POA: Diagnosis not present

## 2020-10-27 DIAGNOSIS — Z961 Presence of intraocular lens: Secondary | ICD-10-CM | POA: Diagnosis not present

## 2020-10-27 DIAGNOSIS — H353131 Nonexudative age-related macular degeneration, bilateral, early dry stage: Secondary | ICD-10-CM | POA: Diagnosis not present

## 2020-10-27 DIAGNOSIS — H04123 Dry eye syndrome of bilateral lacrimal glands: Secondary | ICD-10-CM | POA: Diagnosis not present

## 2020-11-16 DIAGNOSIS — Z950 Presence of cardiac pacemaker: Secondary | ICD-10-CM | POA: Diagnosis not present

## 2020-11-16 DIAGNOSIS — I442 Atrioventricular block, complete: Secondary | ICD-10-CM | POA: Diagnosis not present

## 2020-11-22 DIAGNOSIS — Z950 Presence of cardiac pacemaker: Secondary | ICD-10-CM | POA: Diagnosis not present

## 2020-11-22 DIAGNOSIS — I442 Atrioventricular block, complete: Secondary | ICD-10-CM | POA: Diagnosis not present

## 2020-11-29 ENCOUNTER — Encounter: Payer: Self-pay | Admitting: Internal Medicine

## 2020-11-29 ENCOUNTER — Other Ambulatory Visit: Payer: Self-pay

## 2020-11-29 ENCOUNTER — Ambulatory Visit (INDEPENDENT_AMBULATORY_CARE_PROVIDER_SITE_OTHER): Payer: Medicare PPO | Admitting: Internal Medicine

## 2020-11-29 VITALS — BP 150/82 | HR 83 | Temp 97.8°F | Ht 66.5 in | Wt 132.8 lb

## 2020-11-29 DIAGNOSIS — N1831 Chronic kidney disease, stage 3a: Secondary | ICD-10-CM | POA: Diagnosis not present

## 2020-11-29 DIAGNOSIS — J9611 Chronic respiratory failure with hypoxia: Secondary | ICD-10-CM

## 2020-11-29 DIAGNOSIS — J439 Emphysema, unspecified: Secondary | ICD-10-CM

## 2020-11-29 DIAGNOSIS — I959 Hypotension, unspecified: Secondary | ICD-10-CM

## 2020-11-29 NOTE — Assessment & Plan Note (Signed)
Resolved, BP uncontrolled today, declines add new antihtn for now, cont f/u bp at home and next visit

## 2020-11-29 NOTE — Assessment & Plan Note (Signed)
O/w stable, declines add inhaler prn

## 2020-11-29 NOTE — Progress Notes (Signed)
Patient ID: Kristopher [name], male   DOB: [birth year], 87 y.o.   MRN: [id number]        Chief Complaint: follow up chronic hypoxic respiratory failure, copd       HPI:  Kristopher [name] is a 87 y.o. male here overall doing ok, but remains requiring home o2 2L begun sept 2021, as any exertion off o2 leads to low sats at home in low 80s with sob/doe.  Pt getting o2 from [facility], pt states tank is too heavy, asking for small portable battery operated unit.  Pt denies chest pain, wheezing, orthopnea, PND, increased LE swelling, palpitations, dizziness or syncope.  Amb o2 sat today is 85% on RA at 100 ft.  BP at home has been < 140/90.  Denies worsening reflux, abd pain, dysphagia, n/v, bowel change or blood.   Pt denies polydipsia, polyuria, or new focal neuro s/s,   Pt denies fever, wt loss, night sweats, loss of appetite, or other constitutional symptoms  Not currenlty seeing pulmonary     Pt continues to require home o2 continuous.   Wt Readings from Last 3 Encounters:  11/29/20 132 lb 12.8 oz (60.2 kg)  08/31/20 130 lb (59 kg)  07/28/20 132 lb (59.9 kg)   BP Readings from Last 3 Encounters:  11/29/20 (!) 150/82  08/31/20 122/70  07/28/20 100/66         Past Medical History:  Diagnosis Date   AAA (abdominal aortic aneurysm) ([place])    ultrasound 07/2011:  2.7 x 2.8 cm   Acute myocardial infarction, unspecified site, episode of care unspecified 1998   Aortic stenosis    echo 4/12: normal LVF, mild LAE, no significant AS   Arthritis    "all over" (05/21/2017)   CAD (coronary artery disease)    s/p PCI of LAD 1998;  s/p DES to RCA 05/2009;  Myoview 4/13: low risk, no ischemia, EF 56%   CAP (community acquired pneumonia) 05/21/2017   COPD (chronic obstructive pulmonary disease) ([facility])    DJD (degenerative joint disease), lumbar 07/06/2011   Esophageal reflux    HLD (hyperlipidemia)    HTN (hypertension)    PAD (peripheral artery disease) ([facility])    s/p L CIA stent 09/2011 ([place])   Pneumonia     "I've always had pneumonia; since I was 78 months old" (05/21/2017)   Renal artery stenosis ([facility]CC)    s/p bilat RA stents   Urine incontinence    Past Surgical History:  Procedure Laterality Date   CATARACT EXTRACTION W/ INTRAOCULAR LENS  IMPLANT, BILATERAL Bilateral YRS AGO   CORONARY ANGIOPLASTY WITH STENT PLACEMENT  05/1996   PCI of LAD; "I've got a total of 4 stents" (05/21/2017)   [place] Bilateral 09/20/2011   Procedure: LOWER EXTREMITY ANGIOGRAM;  Surgeon: Wellington Hampshire, MD;  Location: [facility];  Service: Cardiovascular;  Laterality: Bilateral;   PACEMAKER IMPLANT N/A 12/17/2019   Procedure: PACEMAKER IMPLANT;  Surgeon: Vickie Epley, MD;  Location: [facility] CV [facility];  Service: Cardiovascular;  Laterality: N/A;   TRANSURETHRAL RESECTION OF PROSTATE  08/11/2011   Procedure: TRANSURETHRAL RESECTION OF THE PROSTATE WITH GYRUS INSTRUMENTS;  Surgeon: Fredricka Bonine, MD;  Location: [facility];  Service: Urology;  Laterality: N/A;      VASECTOMY  1968    reports that he quit smoking about 32 years ago. He has a 114.00 pack-year smoking history. He has never used smokeless tobacco. He reports  current alcohol use. He reports that he does not use drugs. family history includes Heart disease in his father and mother. Allergies  Allergen Reactions   Influenza Vaccines Other (See Comments)    unknown   Influenza Virus Vaccine Other (See Comments)    Several reaction.  Took it once woke up two days later in the hospital.    Sulfa Antibiotics Rash   Current Outpatient Medications on File Prior to Visit  Medication Sig Dispense Refill   Ascorbic Acid (VITAMIN C) 500 MG tablet Take 500 mg by mouth 2 (two) times daily.     aspirin 81 MG tablet Take 1 tablet (81 mg total) by mouth daily.     BEE POLLEN PO Take 1 capsule by mouth 2 (two) times daily.      cyclobenzaprine (FLEXERIL) 5 MG tablet TAKE 1 TABLET(5 MG) BY MOUTH AT BEDTIME AS NEEDED  FOR MUSCLE SPASMS (Patient taking differently: Take 5 mg by mouth at bedtime as needed for muscle spasms.) 30 tablet 2   diclofenac sodium (VOLTAREN) 1 % GEL APPLY 2 GRAMS TOPICALLY FOUR TIMES A DAY AS NEEDED. (Patient taking differently: Apply 2 g topically 4 (four) times daily as needed (joint pain).) 700 g 4   Docusate Calcium (STOOL SOFTENER PO) Take 2 capsules by mouth daily.     Multiple Vitamin (MULTIVITAMIN) capsule Take 1 capsule by mouth daily.     Multiple Vitamins-Minerals (PRESERVISION AREDS PO) Take 2 tablets by mouth daily.     nitroGLYCERIN (NITROSTAT) 0.4 MG SL tablet Place 1 tablet (0.4 mg total) under the tongue every 5 (five) minutes as needed for chest pain. 90 tablet 3   OVER THE COUNTER MEDICATION Take 2 capsules by mouth every morning. NITRIC OXIDE     pantoprazole (PROTONIX) 40 MG tablet Take 1 tablet (40 mg total) by mouth 2 (two) times daily. 180 tablet 3   rosuvastatin (CRESTOR) 40 MG tablet TAKE 1 TABLET DAILY 90 tablet 3   No current facility-administered medications on file prior to visit.        ROS:  All others reviewed and negative.  Objective        PE:  BP (!) 150/82 (BP Location: Left Arm, Patient Position: Sitting, Cuff Size: Normal)   Pulse 83   Temp 97.8 F (36.6 C) (Oral)   Ht 5' 6.5" (1.689 m)   Wt 132 lb 12.8 oz (60.2 kg)   SpO2 96%   BMI 21.11 kg/m                 Constitutional: Pt appears in NAD               HENT: Head: NCAT.                Right Ear: External ear normal.                 Left Ear: External ear normal.                Eyes: . Pupils are equal, round, and reactive to light. Conjunctivae and EOM are normal               Nose: without d/c or deformity               Neck: Neck supple. Gross normal ROM               Cardiovascular: Normal rate and regular rhythm.  Pulmonary/Chest: Effort normal and breath sounds decreased without rales or wheezing.                Abd:  Soft, NT, ND, + BS, no organomegaly                Neurological: Pt is alert. At baseline orientation, motor grossly intact               Skin: Skin is warm. No rashes, no other new lesions, LE edema - none               Psychiatric: Pt behavior is normal without agitation   Micro: none  Cardiac tracings I have personally interpreted today:  none  Pertinent Radiological findings (summarize): none   Lab Results  Component Value Date   WBC 9.4 07/19/2020   HGB 13.6 07/19/2020   HCT 41.0 07/19/2020   PLT 221.0 07/19/2020   GLUCOSE 108 (H) 07/19/2020   CHOL 137 07/19/2020   TRIG 72.0 07/19/2020   HDL 51.60 07/19/2020   LDLCALC 71 07/19/2020   ALT 17 07/19/2020   AST 27 07/19/2020   NA 144 07/19/2020   K 4.9 07/19/2020   CL 109 07/19/2020   CREATININE 1.36 07/19/2020   BUN 33 (H) 07/19/2020   CO2 26 07/19/2020   TSH 5.77 (H) 07/19/2020   INR 1.01 08/31/2013   HGBA1C 6.6 (H) 09/20/2018   Assessment/Plan:  Dvaughn Gwyn Schoof is a 87 y.o. White or Caucasian [1] male with  has a past medical history of AAA (abdominal aortic aneurysm) ([place]), Acute myocardial infarction, unspecified site, episode of care unspecified (1998), Aortic stenosis, Arthritis, CAD (coronary artery disease), CAP (community acquired pneumonia) (05/21/2017), COPD (chronic obstructive pulmonary disease) ([place]), DJD (degenerative joint disease), lumbar (07/06/2011), Esophageal reflux, HLD (hyperlipidemia), HTN (hypertension), PAD (peripheral artery disease) ([place]), Pneumonia, Renal artery stenosis ([place]), and Urine incontinence.  Chronic hypoxemic respiratory failure (HCC) Chronic persistent, will need new rx for home o2 with small port unit to [facility] with chart notes,, also for pulmonary referral  COPD (chronic obstructive pulmonary disease) (HCC) O/w stable, declines add inhaler prn  Hypotension Resolved, BP uncontrolled today, declines add new antihtn for now, cont f/u bp at home and next visit  CKD (chronic kidney disease) stage 3, GFR 30-59 ml/min  (HCC) Lab Results  Component Value Date   CREATININE 1.36 07/19/2020   Stable overall, cont to avoid nephrotoxins  Followup: Return if symptoms worsen or fail to improve.  [name], MD 11/29/2020 9:09 PM [facility]

## 2020-11-29 NOTE — Assessment & Plan Note (Addendum)
Chronic persistent, will need new rx for home o2 with small port unit to [facility] with chart notes,, also for pulmonary referral

## 2020-11-29 NOTE — Assessment & Plan Note (Signed)
Lab Results  Component Value Date   CREATININE 1.36 07/19/2020   Stable overall, cont to avoid nephrotoxins

## 2020-11-29 NOTE — Patient Instructions (Signed)
Please continue all other medications as before, and refills have been done if requested.  Please have the pharmacy call with any other refills you may need.  Please continue your efforts at being more active, low cholesterol diet, and weight control.  You are otherwise up to date with prevention measures today.  Please keep your appointments with your specialists as you may have planned  Your notes and order to be faxed to [facility]  You will be contacted regarding the referral for: pulmonary

## 2020-12-17 ENCOUNTER — Ambulatory Visit (INDEPENDENT_AMBULATORY_CARE_PROVIDER_SITE_OTHER): Payer: Medicare PPO

## 2020-12-17 DIAGNOSIS — Z950 Presence of cardiac pacemaker: Secondary | ICD-10-CM | POA: Diagnosis not present

## 2020-12-17 DIAGNOSIS — I442 Atrioventricular block, complete: Secondary | ICD-10-CM

## 2020-12-17 LAB — CUP PACEART REMOTE DEVICE CHECK
Battery Remaining Longevity: 156 mo
Battery Remaining Percentage: 100 %
Brady Statistic RA Percent Paced: 4 %
Brady Statistic RV Percent Paced: 100 %
Date Time Interrogation Session: 20220909032400
Implantable Lead Implant Date: 20210908
Implantable Lead Implant Date: 20210908
Implantable Lead Location: 753859
Implantable Lead Location: 753860
Implantable Lead Model: 7841
Implantable Lead Model: 7842
Implantable Lead Serial Number: 1054557
Implantable Lead Serial Number: 1096552
Implantable Pulse Generator Implant Date: 20210908
Lead Channel Impedance Value: 718 Ohm
Lead Channel Impedance Value: 822 Ohm
Lead Channel Pacing Threshold Amplitude: 0.6 V
Lead Channel Pacing Threshold Amplitude: 0.9 V
Lead Channel Pacing Threshold Pulse Width: 0.4 ms
Lead Channel Pacing Threshold Pulse Width: 0.4 ms
Lead Channel Setting Pacing Amplitude: 1.5 V
Lead Channel Setting Pacing Amplitude: 2.5 V
Lead Channel Setting Pacing Pulse Width: 0.4 ms
Lead Channel Setting Sensing Sensitivity: 3 mV
Pulse Gen Serial Number: 948694

## 2020-12-22 NOTE — Progress Notes (Signed)
Remote pacemaker transmission.   

## 2020-12-23 DIAGNOSIS — Z950 Presence of cardiac pacemaker: Secondary | ICD-10-CM | POA: Diagnosis not present

## 2020-12-23 DIAGNOSIS — I442 Atrioventricular block, complete: Secondary | ICD-10-CM | POA: Diagnosis not present

## 2021-01-11 ENCOUNTER — Other Ambulatory Visit: Payer: Self-pay | Admitting: Internal Medicine

## 2021-01-11 NOTE — Telephone Encounter (Signed)
Please refill as per office routine med refill policy (all routine meds to be refilled for 3 mo or monthly (per pt preference) up to one year from last visit, then month to month grace period for 3 mo, then further med refills will have to be denied) ? ?

## 2021-01-16 DIAGNOSIS — I442 Atrioventricular block, complete: Secondary | ICD-10-CM | POA: Diagnosis not present

## 2021-01-16 DIAGNOSIS — Z950 Presence of cardiac pacemaker: Secondary | ICD-10-CM | POA: Diagnosis not present

## 2021-01-22 DIAGNOSIS — Z950 Presence of cardiac pacemaker: Secondary | ICD-10-CM | POA: Diagnosis not present

## 2021-01-22 DIAGNOSIS — I442 Atrioventricular block, complete: Secondary | ICD-10-CM | POA: Diagnosis not present

## 2021-01-27 ENCOUNTER — Encounter: Payer: Self-pay | Admitting: Internal Medicine

## 2021-01-27 ENCOUNTER — Ambulatory Visit (INDEPENDENT_AMBULATORY_CARE_PROVIDER_SITE_OTHER): Payer: Medicare PPO | Admitting: Internal Medicine

## 2021-01-27 ENCOUNTER — Other Ambulatory Visit: Payer: Self-pay

## 2021-01-27 VITALS — BP 136/78 | HR 78 | Ht 66.5 in | Wt 130.0 lb

## 2021-01-27 DIAGNOSIS — R739 Hyperglycemia, unspecified: Secondary | ICD-10-CM | POA: Diagnosis not present

## 2021-01-27 DIAGNOSIS — I10 Essential (primary) hypertension: Secondary | ICD-10-CM

## 2021-01-27 DIAGNOSIS — E039 Hypothyroidism, unspecified: Secondary | ICD-10-CM

## 2021-01-27 DIAGNOSIS — K219 Gastro-esophageal reflux disease without esophagitis: Secondary | ICD-10-CM

## 2021-01-27 DIAGNOSIS — N1831 Chronic kidney disease, stage 3a: Secondary | ICD-10-CM

## 2021-01-27 DIAGNOSIS — E559 Vitamin D deficiency, unspecified: Secondary | ICD-10-CM

## 2021-01-27 DIAGNOSIS — E7800 Pure hypercholesterolemia, unspecified: Secondary | ICD-10-CM | POA: Diagnosis not present

## 2021-01-27 MED ORDER — OMEPRAZOLE 40 MG PO CPDR: 40.0000 mg | DELAYED_RELEASE_CAPSULE | Freq: Two times a day (BID) | ORAL | 3 refills | Status: DC

## 2021-01-27 NOTE — Patient Instructions (Addendum)
Please keep in mind the Novavax covid vaccine  You had the flu shot today  Please call if we need to send an order somehow for the portable oxygen  Please take OTC Vitamin D3 at 2000 units per day, indefinitely  Your protonix was changed back to the omeprazole 40 mg twice per day  Please continue all other medications as before, and refills have been done if requested.  Please have the pharmacy call with any other refills you may need.  Please continue your efforts at being more active, low cholesterol diet, and weight control.  Please keep your appointments with your specialists as you may have planned  Please go to the LAB at the blood drawing area for the tests to be done  You will be contacted by phone if any changes need to be made immediately.  Otherwise, you will receive a letter about your results with an explanation, but please check with MyChart first.  Please remember to sign up for MyChart if you have not done so, as this will be important to you in the future with finding out test results, communicating by private email, and scheduling acute appointments online when needed.  Please make an Appointment to return in 6 months, or sooner if needed

## 2021-01-27 NOTE — Progress Notes (Signed)
Patient ID: Kristopher [name], male   DOB: 1930-06-28, 87 y.o.   MRN: [id number]        Chief Complaint: follow up hld, low vit d, gerd, hyperglycemia, ckd       HPI:  Kristopher [name] is a 87 y.o. male here overall doing ok, Pt denies chest pain, increased sob or doe, wheezing, orthopnea, PND, increased LE swelling, palpitations, dizziness or syncope.   Pt denies polydipsia, polyuria, or new focal neuro s/s  good med compliance including statin.  Protonix not working as well anymore for reflux, asks for change back to omeprazole 40 bid.  Denies worsening other abd pain, dysphagia, n/v, bowel change or blood.  Not taking Vit D       Wt Readings from Last 3 Encounters:  01/27/21 130 lb (59 kg)  11/29/20 132 lb 12.8 oz (60.2 kg)  08/31/20 130 lb (59 kg)   BP Readings from Last 3 Encounters:  01/27/21 136/78  11/29/20 (!) 150/82  08/31/20 122/70         Past Medical History:  Diagnosis Date   AAA (abdominal aortic aneurysm)    ultrasound 07/2011:  2.7 x 2.8 cm   Acute myocardial infarction, unspecified site, episode of care unspecified 1998   Aortic stenosis    echo 4/12: normal LVF, mild LAE, no significant AS   Arthritis    "all over" (05/21/2017)   CAD (coronary artery disease)    s/p PCI of LAD 1998;  s/p DES to RCA 05/2009;  Myoview 4/13: low risk, no ischemia, EF 56%   CAP (community acquired pneumonia) 05/21/2017   COPD (chronic obstructive pulmonary disease) (HCC)    DJD (degenerative joint disease), lumbar 07/06/2011   Esophageal reflux    HLD (hyperlipidemia)    HTN (hypertension)    PAD (peripheral artery disease) (HCC)    s/p L CIA stent 09/2011 ([facility])   Pneumonia    "I've always had pneumonia; since I was 25 months old" (05/21/2017)   Renal artery stenosis (HCC)    s/p bilat RA stents   Urine incontinence    Past Surgical History:  Procedure Laterality Date   CATARACT EXTRACTION W/ INTRAOCULAR LENS  IMPLANT, BILATERAL Bilateral YRS AGO   CORONARY ANGIOPLASTY WITH STENT  PLACEMENT  05/1996   PCI of LAD; "I've got a total of 4 stents" (05/21/2017)   [place] Bilateral 09/20/2011   Procedure: LOWER EXTREMITY ANGIOGRAM;  Surgeon: Wellington Hampshire, MD;  Location: [facility];  Service: Cardiovascular;  Laterality: Bilateral;   PACEMAKER IMPLANT N/A 12/17/2019   Procedure: PACEMAKER IMPLANT;  Surgeon: Vickie Epley, MD;  Location: [facility];  Service: Cardiovascular;  Laterality: N/A;   TRANSURETHRAL RESECTION OF PROSTATE  08/11/2011   Procedure: TRANSURETHRAL RESECTION OF THE PROSTATE WITH GYRUS INSTRUMENTS;  Surgeon: Fredricka Bonine, MD;  Location: [facility];  Service: Urology;  Laterality: N/A;      VASECTOMY  1968    reports that he quit smoking about 32 years ago. His smoking use included cigarettes. He has a 114.00 pack-year smoking history. He has never used smokeless tobacco. He reports current alcohol use. He reports that he does not use drugs. family history includes Heart disease in his father and mother. Allergies  Allergen Reactions   Influenza Vaccines Other (See Comments)    unknown   Influenza Virus Vaccine Other (See Comments)    Several reaction.  Took it once woke up two  days later in the hospital.    Sulfa Antibiotics Rash   Current Outpatient Medications on File Prior to Visit  Medication Sig Dispense Refill   Ascorbic Acid (VITAMIN C) 500 MG tablet Take 500 mg by mouth 2 (two) times daily.     aspirin 81 MG tablet Take 1 tablet (81 mg total) by mouth daily.     BEE POLLEN PO Take 1 capsule by mouth 2 (two) times daily.      cyclobenzaprine (FLEXERIL) 5 MG tablet TAKE 1 TABLET(5 MG) BY MOUTH AT BEDTIME AS NEEDED FOR MUSCLE SPASMS (Patient taking differently: Take 5 mg by mouth at bedtime as needed for muscle spasms.) 30 tablet 2   diclofenac sodium (VOLTAREN) 1 % GEL APPLY 2 GRAMS TOPICALLY FOUR TIMES A DAY AS NEEDED. (Patient taking differently: Apply 2 g topically 4 (four) times  daily as needed (joint pain).) 700 g 4   Docusate Calcium (STOOL SOFTENER PO) Take 2 capsules by mouth daily.     Multiple Vitamin (MULTIVITAMIN) capsule Take 1 capsule by mouth daily.     Multiple Vitamins-Minerals (PRESERVISION AREDS PO) Take 2 tablets by mouth daily.     nitroGLYCERIN (NITROSTAT) 0.4 MG SL tablet Place 1 tablet (0.4 mg total) under the tongue every 5 (five) minutes as needed for chest pain. 90 tablet 3   OVER THE COUNTER MEDICATION Take 2 capsules by mouth every morning. NITRIC OXIDE     rosuvastatin (CRESTOR) 40 MG tablet TAKE 1 TABLET DAILY 90 tablet 3   No current facility-administered medications on file prior to visit.        ROS:  All others reviewed and negative.  Objective        PE:  BP 136/78 (BP Location: Right Arm, Patient Position: Sitting, Cuff Size: Normal)   Pulse 78   Ht 5' 6.5" (1.689 m)   Wt 130 lb (59 kg)   SpO2 91%   BMI 20.67 kg/m                 Constitutional: Pt appears in NAD               HENT: Head: NCAT.                Right Ear: External ear normal.                 Left Ear: External ear normal.                Eyes: . Pupils are equal, round, and reactive to light. Conjunctivae and EOM are normal               Nose: without d/c or deformity               Neck: Neck supple. Gross normal ROM               Cardiovascular: Normal rate and regular rhythm.                 Pulmonary/Chest: Effort normal and breath sounds without rales or wheezing.                Abd:  Soft, NT, ND, + BS, no organomegaly               Neurological: Pt is alert. At baseline orientation, motor grossly intact               Skin: Skin is warm. No rashes, no other new lesions,  LE edema - none               Psychiatric: Pt behavior is normal without agitation   Micro: none  Cardiac tracings I have personally interpreted today:  none  Pertinent Radiological findings (summarize): none   Lab Results  Component Value Date   WBC 9.4 07/19/2020   HGB 13.6  07/19/2020   HCT 41.0 07/19/2020   PLT 221.0 07/19/2020   GLUCOSE 108 (H) 07/19/2020   CHOL 137 07/19/2020   TRIG 72.0 07/19/2020   HDL 51.60 07/19/2020   LDLCALC 71 07/19/2020   ALT 17 07/19/2020   AST 27 07/19/2020   NA 144 07/19/2020   K 4.9 07/19/2020   CL 109 07/19/2020   CREATININE 1.36 07/19/2020   BUN 33 (H) 07/19/2020   CO2 26 07/19/2020   TSH 5.77 (H) 07/19/2020   INR 1.01 08/31/2013   HGBA1C 6.6 (H) 09/20/2018   Assessment/Plan:  Brek Reece Mcaleer is a 87 y.o. White or Caucasian [1] male with  has a past medical history of AAA (abdominal aortic aneurysm), Acute myocardial infarction, unspecified site, episode of care unspecified (1998), Aortic stenosis, Arthritis, CAD (coronary artery disease), CAP (community acquired pneumonia) (05/21/2017), COPD (chronic obstructive pulmonary disease) ([place]), DJD (degenerative joint disease), lumbar (07/06/2011), Esophageal reflux, HLD (hyperlipidemia), HTN (hypertension), PAD (peripheral artery disease) ([place]), Pneumonia, Renal artery stenosis ([place]), and Urine incontinence.  Hyperlipidemia Lab Results  Component Value Date   LDLCALC 71 07/19/2020   Mild uncontrolled, goal ldl <70 pt to continue current crestor 47, declines add zetia, to work on lower chol diet   Vitamin D deficiency Last vitamin D Lab Results  Component Value Date   VD25OH 33.19 07/19/2020   Low, to start oral replacement   Essential hypertension BP Readings from Last 3 Encounters:  01/27/21 136/78  11/29/20 (!) 150/82  08/31/20 122/70   Stable, pt to continue medical treatment  - low salt det   CKD (chronic kidney disease) stage 3, GFR 30-59 ml/min (HCC) Lab Results  Component Value Date   CREATININE 1.36 07/19/2020   Stable overall, cont to avoid nephrotoxins   Hyperglycemia Lab Results  Component Value Date   HGBA1C 6.6 (H) 09/20/2018   Mild uncontrolled, pt to continue current medical treatment  - diet  GERD Uncontrolled, Ok for change back  to prilosec 40 bid  Followup: No follow-ups on file.  [name], MD 01/30/2021 2:49 PM [facility]

## 2021-01-30 NOTE — Assessment & Plan Note (Signed)
Uncontrolled, Ok for change back to prilosec 40 bid

## 2021-01-30 NOTE — Assessment & Plan Note (Signed)
Lab Results  Component Value Date   LDLCALC 71 07/19/2020   Mild uncontrolled, goal ldl <70 pt to continue current crestor 40, declines add zetia, to work on lower chol diet

## 2021-01-30 NOTE — Assessment & Plan Note (Signed)
Lab Results  Component Value Date   HGBA1C 6.6 (H) 09/20/2018   Mild uncontrolled, pt to continue current medical treatment  - diet

## 2021-01-30 NOTE — Assessment & Plan Note (Signed)
BP Readings from Last 3 Encounters:  01/27/21 136/78  11/29/20 (!) 150/82  08/31/20 122/70   Stable, pt to continue medical treatment  - low salt det

## 2021-01-30 NOTE — Assessment & Plan Note (Signed)
Last vitamin D Lab Results  Component Value Date   VD25OH 33.19 07/19/2020   Low, to start oral replacement

## 2021-01-30 NOTE — Assessment & Plan Note (Signed)
Lab Results  Component Value Date   CREATININE 1.36 07/19/2020   Stable overall, cont to avoid nephrotoxins

## 2021-02-16 DIAGNOSIS — Z950 Presence of cardiac pacemaker: Secondary | ICD-10-CM | POA: Diagnosis not present

## 2021-02-16 DIAGNOSIS — I442 Atrioventricular block, complete: Secondary | ICD-10-CM | POA: Diagnosis not present

## 2021-02-22 ENCOUNTER — Encounter: Payer: Self-pay | Admitting: Internal Medicine

## 2021-02-22 ENCOUNTER — Encounter: Payer: Medicare PPO | Attending: Registered Nurse | Admitting: Registered Nurse

## 2021-02-22 ENCOUNTER — Other Ambulatory Visit (INDEPENDENT_AMBULATORY_CARE_PROVIDER_SITE_OTHER): Payer: Medicare PPO

## 2021-02-22 ENCOUNTER — Other Ambulatory Visit: Payer: Self-pay

## 2021-02-22 ENCOUNTER — Encounter: Payer: Self-pay | Admitting: Registered Nurse

## 2021-02-22 VITALS — BP 111/78 | HR 89 | Temp 97.8°F | Ht 66.5 in | Wt 129.2 lb

## 2021-02-22 DIAGNOSIS — M25561 Pain in right knee: Secondary | ICD-10-CM | POA: Insufficient documentation

## 2021-02-22 DIAGNOSIS — M48062 Spinal stenosis, lumbar region with neurogenic claudication: Secondary | ICD-10-CM | POA: Insufficient documentation

## 2021-02-22 DIAGNOSIS — Z79891 Long term (current) use of opiate analgesic: Secondary | ICD-10-CM | POA: Diagnosis not present

## 2021-02-22 DIAGNOSIS — M62838 Other muscle spasm: Secondary | ICD-10-CM | POA: Diagnosis not present

## 2021-02-22 DIAGNOSIS — G894 Chronic pain syndrome: Secondary | ICD-10-CM | POA: Diagnosis not present

## 2021-02-22 DIAGNOSIS — R739 Hyperglycemia, unspecified: Secondary | ICD-10-CM | POA: Diagnosis not present

## 2021-02-22 DIAGNOSIS — E559 Vitamin D deficiency, unspecified: Secondary | ICD-10-CM

## 2021-02-22 DIAGNOSIS — Z5181 Encounter for therapeutic drug level monitoring: Secondary | ICD-10-CM | POA: Diagnosis not present

## 2021-02-22 DIAGNOSIS — I10 Essential (primary) hypertension: Secondary | ICD-10-CM

## 2021-02-22 LAB — LIPID PANEL
Cholesterol: 134 mg/dL (ref 0–200)
HDL: 52.4 mg/dL (ref >39.00)
LDL Cholesterol: 64 mg/dL (ref 0–99)
NonHDL: 81.38
Total CHOL/HDL Ratio: 3
Triglycerides: 89 mg/dL (ref 0.0–149.0)
VLDL: 17.8 mg/dL (ref 0.0–40.0)

## 2021-02-22 LAB — BASIC METABOLIC PANEL
BUN: 33 mg/dL — ABNORMAL HIGH (ref 6–23)
CO2: 27 mEq/L (ref 19–32)
Calcium: 9.3 mg/dL (ref 8.4–10.5)
Chloride: 105 mEq/L (ref 96–112)
Creatinine, Ser: 1.32 mg/dL (ref 0.40–1.50)
GFR: 48.19 mL/min — ABNORMAL LOW (ref >60.00)
Glucose, Bld: 81 mg/dL (ref 70–99)
Potassium: 4.1 mEq/L (ref 3.5–5.1)
Sodium: 141 mEq/L (ref 135–145)

## 2021-02-22 LAB — HEPATIC FUNCTION PANEL
ALT: 19 U/L (ref 0–53)
AST: 30 U/L (ref 0–37)
Albumin: 4.1 g/dL (ref 3.5–5.2)
Alkaline Phosphatase: 90 U/L (ref 39–117)
Bilirubin, Direct: 0 mg/dL (ref 0.0–0.3)
Total Bilirubin: 0.3 mg/dL (ref 0.2–1.2)
Total Protein: 7.3 g/dL (ref 6.0–8.3)

## 2021-02-22 LAB — VITAMIN D 25 HYDROXY (VIT D DEFICIENCY, FRACTURES): VITD: 40.94 ng/mL (ref 30.00–100.00)

## 2021-02-22 LAB — HEMOGLOBIN A1C: Hgb A1c MFr Bld: 6.4 % (ref 4.6–6.5)

## 2021-02-22 NOTE — Progress Notes (Signed)
Subjective:    Patient ID: Kristopher [name], male    DOB: 09/07/1930, 87 y.o.   MRN: [id number]  HPI: Kristopher [name] is a 87 y.o. male who returns for follow up appointment for chronic pain and medication refill. He states his pain is located in his lower back with activity such as washing dishes and standing for long periods and right knee pain. He rates his pain 4. His current exercise regime is walking, riding stationary bicycle 2- 3 minutes for 2-3 days a week and using his glider for 2- 3 days a week, he states he doing 20 count.    Pain Inventory Average Pain 2 Pain Right Now 4 My pain is dull, aching, and pressure  In the last 24 hours, has pain interfered with the following? General activity 7 Relation with others 7 Enjoyment of life 7 What TIME of day is your pain at its worst? varies Sleep (in general) Fair  Pain is worse with: walking and unsure Pain improves with: medication Relief from Meds: 9  Family History  Problem Relation Age of Onset   Heart disease Mother    Heart disease Father    Social History   Socioeconomic History   Marital status: Widowed    Spouse name: Not on file   Number of children: Not on file   Years of education: GED   Highest education level: Not on file  Occupational History   Occupation: retired  Tobacco Use   Smoking status: Former    Packs/day: 3.00    Years: 38.00    Pack years: 114.00    Types: Cigarettes    Quit date: 04/10/1988    Years since quitting: 32.8   Smokeless tobacco: Never  Vaping Use   Vaping Use: Never used  Substance and Sexual Activity   Alcohol use: Yes    Comment: 05/21/2017 "might drink 6 beers/year"   Drug use: No   Sexual activity: Not on file  Other Topics Concern   Not on file  Social History Narrative   Not on file   Social Determinants of Health   Financial Resource Strain: Low Risk    Difficulty of Paying Living Expenses: Not hard at all  Food Insecurity: No Food Insecurity   Worried About  Charity fundraiser in the Last Year: Never true   [place] in the Last Year: Never true  Transportation Needs: No Transportation Needs   Lack of Transportation (Medical): No   Lack of Transportation (Non-Medical): No  Physical Activity: Sufficiently Active   Days of Exercise per Week: 5 days   Minutes of Exercise per Session: 30 min  Stress: No Stress Concern Present   Feeling of Stress : Not at all  Social Connections: Moderately Integrated   Frequency of Communication with Friends and Family: More than three times a week   Frequency of Social Gatherings with Friends and Family: More than three times a week   Attends Religious Services: 1 to 4 times per year   Active Member of [employer] or Organizations: No   Attends Archivist Meetings: 1 to 4 times per year   Marital Status: Widowed   Past Surgical History:  Procedure Laterality Date   CATARACT EXTRACTION W/ INTRAOCULAR LENS  IMPLANT, BILATERAL Bilateral YRS AGO   CORONARY ANGIOPLASTY WITH STENT PLACEMENT  05/1996   PCI of LAD; "I've got a total of 4 stents" (05/21/2017)   [place]  ANGIOGRAM Bilateral 09/20/2011   Procedure: LOWER EXTREMITY ANGIOGRAM;  Surgeon: Wellington Hampshire, MD;  Location: [facility];  Service: Cardiovascular;  Laterality: Bilateral;   PACEMAKER IMPLANT N/A 12/17/2019   Procedure: PACEMAKER IMPLANT;  Surgeon: Vickie Epley, MD;  Location: [facility];  Service: Cardiovascular;  Laterality: N/A;   TRANSURETHRAL RESECTION OF PROSTATE  08/11/2011   Procedure: TRANSURETHRAL RESECTION OF THE PROSTATE WITH GYRUS INSTRUMENTS;  Surgeon: Fredricka Bonine, MD;  Location: [facility];  Service: Urology;  Laterality: N/A;      [place]   Past Surgical History:  Procedure Laterality Date   CATARACT EXTRACTION W/ INTRAOCULAR LENS  IMPLANT, BILATERAL Bilateral YRS AGO   CORONARY ANGIOPLASTY WITH STENT PLACEMENT  05/1996   PCI of LAD; "I've got a total of 4  stents" (05/21/2017)   [place]   LOWER EXTREMITY ANGIOGRAM Bilateral 09/20/2011   Procedure: LOWER EXTREMITY ANGIOGRAM;  Surgeon: Wellington Hampshire, MD;  Location: [facility];  Service: Cardiovascular;  Laterality: Bilateral;   PACEMAKER IMPLANT N/A 12/17/2019   Procedure: PACEMAKER IMPLANT;  Surgeon: Vickie Epley, MD;  Location: [facility];  Service: Cardiovascular;  Laterality: N/A;   TRANSURETHRAL RESECTION OF PROSTATE  08/11/2011   Procedure: TRANSURETHRAL RESECTION OF THE PROSTATE WITH GYRUS INSTRUMENTS;  Surgeon: Fredricka Bonine, MD;  [facility];  Service: Urology;  Laterality: N/A;      VASECTOMY  1968   Past Medical History:  Diagnosis Date   AAA (abdominal aortic aneurysm)    ultrasound 07/2011:  2.7 x 2.8 cm   Acute myocardial infarction, unspecified site, episode of care unspecified 1998   Aortic stenosis    echo 4/12: normal LVF, mild LAE, no significant AS   Arthritis    "all over" (05/21/2017)   CAD (coronary artery disease)    s/p PCI of LAD 1998;  s/p DES to RCA 05/2009;  Myoview 4/13: low risk, no ischemia, EF 56%   CAP (community acquired pneumonia) 05/21/2017   COPD (chronic obstructive pulmonary disease) (HCC)    DJD (degenerative joint disease), lumbar 07/06/2011   Esophageal reflux    HLD (hyperlipidemia)    HTN (hypertension)    PAD (peripheral artery disease) (HCC)    s/p L CIA stent 09/2011 ([facility])   Pneumonia    "I've always had pneumonia; since I was 23 months old" (05/21/2017)   Renal artery stenosis (HCC)    s/p bilat RA stents   Urine incontinence    Ht 5' 6.5" (1.689 m)   Wt 129 lb 3.2 oz (58.6 kg)   BMI 20.54 kg/m   Opioid Risk Score:   Fall Risk Score:  `1  Depression screen PHQ 2/9  Depression screen [facility] 2/9 02/22/2021 01/27/2021 08/31/2020 07/28/2020 07/28/2020 01/19/2020 12/12/2019  Decreased Interest 0 0 0 0 0 0 0  Down, Depressed, Hopeless 0 0 0 0 0 0 0  PHQ - 2 Score 0 0 0 0 0 0 0  Altered sleeping - -  - - - - -  Tired, decreased energy - - - - - - -  Change in appetite - - - - - - -  Feeling bad or failure about yourself  - - - - - - -  Trouble concentrating - - - - - - -  Moving slowly or fidgety/restless - - - - - - -  Suicidal thoughts - - - - - - -  PHQ-9 Score - - - - - - -  Some recent data might be hidden     Review of Systems  Constitutional: Negative.   HENT: Negative.    Eyes: Negative.   Respiratory: Negative.    Cardiovascular: Negative.   Gastrointestinal: Negative.   Endocrine: Negative.   Genitourinary: Negative.   Musculoskeletal:  Positive for gait problem.  Skin: Negative.   Allergic/Immunologic: Negative.   Hematological: Negative.   Psychiatric/Behavioral: Negative.        Objective:   Physical Exam Vitals and nursing note reviewed.  Constitutional:      Appearance: Normal appearance.  Cardiovascular:     Rate and Rhythm: Normal rate.     Pulses: Normal pulses.     Heart sounds: Normal heart sounds.  Pulmonary:     Effort: Pulmonary effort is normal.     Breath sounds: Normal breath sounds.  Musculoskeletal:     Cervical back: Normal range of motion and neck supple.     Comments: Normal Muscle Bulk and Muscle Testing Reveals:  Upper Extremities: Full ROM and Muscle Strength 5/5  Lower Extremities: Right Lower Extremity: Decreased ROM and Muscle Strength 5/5 Right Lower Extremity Flexion Produces Pain into his Right Patella Left Lower Extremity: Full ROM and Muscle Strength 5/5 Arises from chair slowly using cane for support Antalgic  Gait     Skin:    General: Skin is warm and dry.  Neurological:     Mental Status: He is alert and oriented to person, place, and time.  Psychiatric:        Mood and Affect: Mood normal.        Behavior: Behavior normal.         Assessment & Plan:  1. Patellofemoral arthritis. Continue Voltaren gel, and continue with exercise regimen. 02/22/2021 2 Chronic Low Back Pain/ Lumbar Stenosis:Continue HEP as  Tolerated and Continue to Monitor. Continue current medication regimen. 02/22/2021 3. Muscle Spasm: Continue current medication regimen with  Flexeril. 02/22/2021 4. Primary Osteoarthritis of Right Hip: Continue to Alternate with Ice and Heat Therapy. Continue to Monitor. 02/22/2021    F/U in 1 year

## 2021-03-10 ENCOUNTER — Other Ambulatory Visit: Payer: Self-pay | Admitting: Cardiology

## 2021-03-17 ENCOUNTER — Telehealth: Payer: Self-pay | Admitting: Internal Medicine

## 2021-03-17 NOTE — Telephone Encounter (Signed)
Yes, for severe copd on home oxygen for chronic hypoxemi resp failure not followed per pulmonary

## 2021-03-17 NOTE — Telephone Encounter (Signed)
Mel Almond from [facility] called to see if the patient still needed to have a referal. When she reached out to the patient he was unsure.Mel Almond can be reached at [phone]

## 2021-03-18 ENCOUNTER — Ambulatory Visit (INDEPENDENT_AMBULATORY_CARE_PROVIDER_SITE_OTHER): Payer: Medicare PPO

## 2021-03-18 DIAGNOSIS — I442 Atrioventricular block, complete: Secondary | ICD-10-CM

## 2021-03-18 LAB — CUP PACEART REMOTE DEVICE CHECK
Battery Remaining Longevity: 150 mo
Battery Remaining Percentage: 100 %
Brady Statistic RA Percent Paced: 5 %
Brady Statistic RV Percent Paced: 100 %
Date Time Interrogation Session: 20221209010100
Implantable Lead Implant Date: 20210908
Implantable Lead Implant Date: 20210908
Implantable Lead Location: 753859
Implantable Lead Location: 753860
Implantable Lead Model: 7841
Implantable Lead Model: 7842
Implantable Lead Serial Number: 1054557
Implantable Lead Serial Number: 1096552
Implantable Pulse Generator Implant Date: 20210908
Lead Channel Impedance Value: 699 Ohm
Lead Channel Impedance Value: 768 Ohm
Lead Channel Pacing Threshold Amplitude: 0.6 V
Lead Channel Pacing Threshold Amplitude: 1 V
Lead Channel Pacing Threshold Pulse Width: 0.4 ms
Lead Channel Pacing Threshold Pulse Width: 0.4 ms
Lead Channel Setting Pacing Amplitude: 1.4 V
Lead Channel Setting Pacing Amplitude: 2.5 V
Lead Channel Setting Pacing Pulse Width: 0.4 ms
Lead Channel Setting Sensing Sensitivity: 3 mV
Pulse Gen Serial Number: 948694

## 2021-03-18 NOTE — Telephone Encounter (Signed)
Notified Kristopher [name] with Pulmonary

## 2021-03-28 NOTE — Progress Notes (Signed)
Remote pacemaker transmission.   

## 2021-06-17 ENCOUNTER — Ambulatory Visit (INDEPENDENT_AMBULATORY_CARE_PROVIDER_SITE_OTHER): Payer: Medicare PPO

## 2021-06-17 DIAGNOSIS — I442 Atrioventricular block, complete: Secondary | ICD-10-CM | POA: Diagnosis not present

## 2021-06-21 LAB — CUP PACEART REMOTE DEVICE CHECK
Battery Remaining Longevity: 150 mo
Battery Remaining Percentage: 100 %
Brady Statistic RA Percent Paced: 5 %
Brady Statistic RV Percent Paced: 100 %
Date Time Interrogation Session: 20230310010100
Implantable Lead Implant Date: 20210908
Implantable Lead Implant Date: 20210908
Implantable Lead Location: 753859
Implantable Lead Location: 753860
Implantable Lead Model: 7841
Implantable Lead Model: 7842
Implantable Lead Serial Number: 1054557
Implantable Lead Serial Number: 1096552
Implantable Pulse Generator Implant Date: 20210908
Lead Channel Impedance Value: 725 Ohm
Lead Channel Impedance Value: 738 Ohm
Lead Channel Pacing Threshold Amplitude: 0.6 V
Lead Channel Pacing Threshold Amplitude: 0.9 V
Lead Channel Pacing Threshold Pulse Width: 0.4 ms
Lead Channel Pacing Threshold Pulse Width: 0.4 ms
Lead Channel Setting Pacing Amplitude: 1.4 V
Lead Channel Setting Pacing Amplitude: 2.5 V
Lead Channel Setting Pacing Pulse Width: 0.4 ms
Lead Channel Setting Sensing Sensitivity: 3 mV
Pulse Gen Serial Number: 948694

## 2021-07-01 NOTE — Progress Notes (Signed)
Remote pacemaker transmission.   

## 2021-08-02 ENCOUNTER — Encounter: Payer: Self-pay | Admitting: Internal Medicine

## 2021-08-02 ENCOUNTER — Ambulatory Visit (INDEPENDENT_AMBULATORY_CARE_PROVIDER_SITE_OTHER): Payer: Medicare PPO | Admitting: Internal Medicine

## 2021-08-02 ENCOUNTER — Ambulatory Visit (INDEPENDENT_AMBULATORY_CARE_PROVIDER_SITE_OTHER): Payer: Medicare PPO

## 2021-08-02 VITALS — BP 118/78 | HR 77 | Temp 97.8°F | Ht 66.5 in | Wt 119.0 lb

## 2021-08-02 DIAGNOSIS — N1831 Chronic kidney disease, stage 3a: Secondary | ICD-10-CM | POA: Diagnosis not present

## 2021-08-02 DIAGNOSIS — J439 Emphysema, unspecified: Secondary | ICD-10-CM

## 2021-08-02 DIAGNOSIS — E559 Vitamin D deficiency, unspecified: Secondary | ICD-10-CM

## 2021-08-02 DIAGNOSIS — E538 Deficiency of other specified B group vitamins: Secondary | ICD-10-CM | POA: Diagnosis not present

## 2021-08-02 DIAGNOSIS — I10 Essential (primary) hypertension: Secondary | ICD-10-CM

## 2021-08-02 DIAGNOSIS — K449 Diaphragmatic hernia without obstruction or gangrene: Secondary | ICD-10-CM | POA: Diagnosis not present

## 2021-08-02 DIAGNOSIS — Z0001 Encounter for general adult medical examination with abnormal findings: Secondary | ICD-10-CM | POA: Diagnosis not present

## 2021-08-02 DIAGNOSIS — E7800 Pure hypercholesterolemia, unspecified: Secondary | ICD-10-CM | POA: Diagnosis not present

## 2021-08-02 DIAGNOSIS — R058 Other specified cough: Secondary | ICD-10-CM | POA: Insufficient documentation

## 2021-08-02 DIAGNOSIS — R739 Hyperglycemia, unspecified: Secondary | ICD-10-CM | POA: Diagnosis not present

## 2021-08-02 DIAGNOSIS — R059 Cough, unspecified: Secondary | ICD-10-CM | POA: Diagnosis not present

## 2021-08-02 DIAGNOSIS — R634 Abnormal weight loss: Secondary | ICD-10-CM | POA: Diagnosis not present

## 2021-08-02 LAB — URINALYSIS, ROUTINE W REFLEX MICROSCOPIC
Bilirubin Urine: NEGATIVE
Hgb urine dipstick: NEGATIVE
Ketones, ur: NEGATIVE
Leukocytes,Ua: NEGATIVE
Nitrite: NEGATIVE
RBC / HPF: NONE SEEN (ref 0–?)
Specific Gravity, Urine: 1.025 (ref 1.000–1.030)
Total Protein, Urine: 30 — AB
Urine Glucose: NEGATIVE
Urobilinogen, UA: 0.2 (ref 0.0–1.0)
pH: 6 (ref 5.0–8.0)

## 2021-08-02 LAB — CBC WITH DIFFERENTIAL/PLATELET
Basophils Absolute: 0.1 10*3/uL (ref 0.0–0.1)
Basophils Relative: 0.9 % (ref 0.0–3.0)
Eosinophils Absolute: 0.6 10*3/uL (ref 0.0–0.7)
Eosinophils Relative: 5.9 % — ABNORMAL HIGH (ref 0.0–5.0)
HCT: 37.3 % — ABNORMAL LOW (ref 39.0–52.0)
Hemoglobin: 11.9 g/dL — ABNORMAL LOW (ref 13.0–17.0)
Lymphocytes Relative: 22.2 % (ref 12.0–46.0)
Lymphs Abs: 2.3 10*3/uL (ref 0.7–4.0)
MCHC: 31.8 g/dL (ref 30.0–36.0)
MCV: 89.7 fl (ref 78.0–100.0)
Monocytes Absolute: 0.9 10*3/uL (ref 0.1–1.0)
Monocytes Relative: 8.8 % (ref 3.0–12.0)
Neutro Abs: 6.5 10*3/uL (ref 1.4–7.7)
Neutrophils Relative %: 62.2 % (ref 43.0–77.0)
Platelets: 249 10*3/uL (ref 150.0–400.0)
RBC: 4.16 Mil/uL — ABNORMAL LOW (ref 4.22–5.81)
RDW: 15.6 % — ABNORMAL HIGH (ref 11.5–15.5)
WBC: 10.4 10*3/uL (ref 4.0–10.5)

## 2021-08-02 LAB — BASIC METABOLIC PANEL
BUN: 29 mg/dL — ABNORMAL HIGH (ref 6–23)
CO2: 25 mEq/L (ref 19–32)
Calcium: 9.4 mg/dL (ref 8.4–10.5)
Chloride: 106 mEq/L (ref 96–112)
Creatinine, Ser: 1.22 mg/dL (ref 0.40–1.50)
GFR: 52.8 mL/min — ABNORMAL LOW (ref >60.00)
Glucose, Bld: 130 mg/dL — ABNORMAL HIGH (ref 70–99)
Potassium: 4.6 mEq/L (ref 3.5–5.1)
Sodium: 142 mEq/L (ref 135–145)

## 2021-08-02 LAB — HEPATIC FUNCTION PANEL
ALT: 17 U/L (ref 0–53)
AST: 26 U/L (ref 0–37)
Albumin: 4.1 g/dL (ref 3.5–5.2)
Alkaline Phosphatase: 97 U/L (ref 39–117)
Bilirubin, Direct: 0.1 mg/dL (ref 0.0–0.3)
Total Bilirubin: 0.4 mg/dL (ref 0.2–1.2)
Total Protein: 7.1 g/dL (ref 6.0–8.3)

## 2021-08-02 LAB — LIPID PANEL
Cholesterol: 142 mg/dL (ref 0–200)
HDL: 56.8 mg/dL (ref >39.00)
LDL Cholesterol: 63 mg/dL (ref 0–99)
NonHDL: 85
Total CHOL/HDL Ratio: 2
Triglycerides: 108 mg/dL (ref 0.0–149.0)
VLDL: 21.6 mg/dL (ref 0.0–40.0)

## 2021-08-02 LAB — HEMOGLOBIN A1C: Hgb A1c MFr Bld: 6.9 % — ABNORMAL HIGH (ref 4.6–6.5)

## 2021-08-02 LAB — VITAMIN B12: Vitamin B-12: 1467 pg/mL — ABNORMAL HIGH (ref 211–911)

## 2021-08-02 LAB — VITAMIN D 25 HYDROXY (VIT D DEFICIENCY, FRACTURES): VITD: 50.52 ng/mL (ref 30.00–100.00)

## 2021-08-02 LAB — SEDIMENTATION RATE: Sed Rate: 34 mm/hr — ABNORMAL HIGH (ref 0–20)

## 2021-08-02 LAB — TSH: TSH: 4.56 u[IU]/mL (ref 0.35–5.50)

## 2021-08-02 NOTE — Assessment & Plan Note (Signed)
With less stamina, fatigue - for esr with labs ?

## 2021-08-02 NOTE — Progress Notes (Signed)
Patient ID: Kristopher [name], male   DOB: 08-10-1930, 88 y.o.   MRN: [id number] ? ? ? ?     Chief Complaint:: wellness exam and Follow-up ? LIH, low vit d, ckd, cough and copd, fatigue ? ?     HPI:  Kristopher [name] is a 88 y.o. male here for wellness exam; declines covid booster ad shingrix, o/w up to date ?         ?              Also taking Vit d  Has persistent LIH for 2 yrs with mild intermittent symptoms, decliens further eval or referral for now.  Has ongoing maybe slightly worsening prod cough in the AM, lower stamina overall in last few months and Pt denies chest pain, increased sob or doe, wheezing, orthopnea, PND, increased LE swelling, palpitations, dizziness or syncope.    Lost several lbs recently, less stamina recently, low appetite and eating less.  Hard to walk with current home oxygen battery pack. Does c/o ongoing fatigue, but denies signficant daytime hypersomnolence. ? ?Wt Readings from Last 3 Encounters:  ?08/02/21 119 lb (54 kg)  ?02/22/21 129 lb 3.2 oz (58.6 kg)  ?01/27/21 130 lb (59 kg)  ? ?BP Readings from Last 3 Encounters:  ?08/02/21 118/78  ?02/22/21 111/78  ?01/27/21 136/78  ? ?Immunization History  ?Administered Date(s) Administered  ? PFIZER(Purple Top)SARS-COV-2 Vaccination 08/06/2019, 08/26/2019, 07/07/2020  ? Pneumococcal Conjugate-13 01/15/2014  ? Pneumococcal Polysaccharide-23 01/26/2016  ? Tdap 11/30/2017  ? Zoster, Live 09/16/2012  ? ?There are no preventive care reminders to display for this patient. ? ?  ? ?Past Medical History:  ?Diagnosis Date  ? AAA (abdominal aortic aneurysm) ([place])   ? ultrasound 07/2011:  2.7 x 2.8 cm  ? Acute myocardial infarction, unspecified site, episode of care unspecified 1998  ? Aortic stenosis   ? echo 4/12: normal LVF, mild LAE, no significant AS  ? Arthritis   ? "all over" (05/21/2017)  ? CAD (coronary artery disease)   ? s/p PCI of LAD 1998;  s/p DES to RCA 05/2009;  Myoview 4/13: low risk, no ischemia, EF 56%  ? CAP (community acquired pneumonia)  05/21/2017  ? COPD (chronic obstructive pulmonary disease) ([place])   ? DJD (degenerative joint disease), lumbar 07/06/2011  ? Esophageal reflux   ? HLD (hyperlipidemia)   ? HTN (hypertension)   ? PAD (peripheral artery disease) ([place])   ? s/p L CIA stent 09/2011 [name]  ? Pneumonia   ? "I've always had pneumonia; since I was 60 months old" (05/21/2017)  ? Renal artery stenosis ([facility])   ? s/p bilat RA stents  ? Urine incontinence   ? ?Past Surgical History:  ?Procedure Laterality Date  ? CATARACT EXTRACTION W/ INTRAOCULAR LENS  IMPLANT, BILATERAL Bilateral YRS AGO  ? CORONARY ANGIOPLASTY WITH STENT PLACEMENT  05/1996  ? PCI of LAD; "I've got a total of 4 stents" (05/21/2017)  ? [place]  ? LOWER EXTREMITY ANGIOGRAM Bilateral 09/20/2011  ? Procedure: LOWER EXTREMITY ANGIOGRAM;  Surgeon: Wellington Hampshire, MD;  Location: [facility];  Service: Cardiovascular;  Laterality: Bilateral;  ? PACEMAKER IMPLANT N/A 12/17/2019  ? Procedure: PACEMAKER IMPLANT;  Surgeon: Vickie Epley, MD;  Location: [facility];  Service: Cardiovascular;  Laterality: N/A;  ? TRANSURETHRAL RESECTION OF PROSTATE  08/11/2011  ? Procedure: TRANSURETHRAL RESECTION OF THE PROSTATE WITH GYRUS INSTRUMENTS;  Surgeon: Fredricka Bonine, MD;  Location: [facility];  Service: Urology;  Laterality: N/A;   ?  ? [place]  ? ? reports that he quit smoking about 33 years ago. His smoking use included cigarettes. He has a 114.00 pack-year smoking history. He has never used smokeless tobacco. He reports current alcohol use. He reports that he does not use drugs. ?family history includes Heart disease in his father and mother. ?Allergies  ?Allergen Reactions  ? Influenza Vaccines Other (See Comments)  ?  unknown  ? Influenza Virus Vaccine Other (See Comments)  ?  Several reaction.  Took it once woke up two days later in the hospital.   ? Sulfa Antibiotics Rash  ? ?Current Outpatient Medications on File Prior to Visit  ?Medication Sig Dispense  Refill  ? Ascorbic Acid (VITAMIN C) 500 MG tablet Take 500 mg by mouth 2 (two) times daily.    ? aspirin 81 MG tablet Take 1 tablet (81 mg total) by mouth daily.    ? BEE POLLEN PO Take 1 capsule by mouth 2 (two) times daily.     ? Cholecalciferol (VITAMIN D3) 50 MCG (2000 UT) TABS Take by mouth.    ? cyclobenzaprine (FLEXERIL) 5 MG tablet TAKE 1 TABLET(5 MG) BY MOUTH AT BEDTIME AS NEEDED FOR MUSCLE SPASMS (Patient taking differently: Take 5 mg by mouth at bedtime as needed for muscle spasms.) 30 tablet 2  ? diclofenac sodium (VOLTAREN) 1 % GEL APPLY 2 GRAMS TOPICALLY FOUR TIMES A DAY AS NEEDED. (Patient taking differently: Apply 2 g topically 4 (four) times daily as needed (joint pain).) 700 g 4  ? Docusate Calcium (STOOL SOFTENER PO) Take 2 capsules by mouth daily.    ? Multiple Vitamin (MULTIVITAMIN) capsule Take 1 capsule by mouth daily.    ? Multiple Vitamins-Minerals (PRESERVISION AREDS PO) Take 2 tablets by mouth daily.    ? nitroGLYCERIN (NITROSTAT) 0.4 MG SL tablet Place 1 tablet (0.4 mg total) under the tongue every 5 (five) minutes as needed for chest pain. 90 tablet 3  ? NON FORMULARY Superfood daily    ? omeprazole (PRILOSEC) 40 MG capsule Take 1 capsule (40 mg total) by mouth in the morning and at bedtime. 180 capsule 3  ? OVER THE COUNTER MEDICATION Take 2 capsules by mouth every morning. NITRIC OXIDE    ? rosuvastatin (CRESTOR) 40 MG tablet TAKE 1 TABLET DAILY 90 tablet 3  ? ?No current facility-administered medications on file prior to visit.  ? ?     ROS:  All others reviewed and negative. ? ?Objective  ? ?     PE:  BP 118/78 (BP Location: Right Arm, Patient Position: Sitting, Cuff Size: Normal)   Pulse 77   Temp 97.8 ?F (36.6 ?C) (Oral)   Ht 5' 6.5" (1.689 m)   Wt 119 lb (54 kg)   SpO2 95%   BMI 18.92 kg/m?  ? ?              Constitutional: Pt appears in NAD ?              HENT: Head: NCAT.  ?              Right Ear: External ear normal.   ?              Left Ear: External ear normal.  ?               Eyes: . Pupils are equal, round, and reactive to light. Conjunctivae and EOM are normal ?  Nose: without d/c or deformity ?              Neck: Neck supple. Gross normal ROM ?              Cardiovascular: Normal rate and regular rhythm.   ?              Pulmonary/Chest: Effort normal and breath sounds without rales or wheezing.  ?              Abd:  Soft, NT, ND, + BS, no organomegaly ?              Neurological: Pt is alert. At baseline orientation, motor grossly intact ?              Skin: Skin is warm. No rashes, no other new lesions, LE edema - none ?              Psychiatric: Pt behavior is normal without agitation  ? ?Micro: none ? ?Cardiac tracings I have personally interpreted today:  none ? ?Pertinent Radiological findings (summarize): none  ? ?Lab Results  ?Component Value Date  ? WBC 10.4 08/02/2021  ? HGB 11.9 (L) 08/02/2021  ? HCT 37.3 (L) 08/02/2021  ? PLT 249.0 08/02/2021  ? GLUCOSE 130 (H) 08/02/2021  ? CHOL 142 08/02/2021  ? TRIG 108.0 08/02/2021  ? HDL 56.80 08/02/2021  ? [place] 63 08/02/2021  ? ALT 17 08/02/2021  ? AST 26 08/02/2021  ? NA 142 08/02/2021  ? K 4.6 08/02/2021  ? CL 106 08/02/2021  ? CREATININE 1.22 08/02/2021  ? BUN 29 (H) 08/02/2021  ? CO2 25 08/02/2021  ? TSH 4.56 08/02/2021  ? INR 1.01 08/31/2013  ? HGBA1C 6.9 (H) 08/02/2021  ? ?Assessment/Plan:  ?AADITH RAUDENBUSH is a 88 y.o. White or Caucasian [1] male with  has a past medical history of AAA (abdominal aortic aneurysm) ([place]), Acute myocardial infarction, unspecified site, episode of care unspecified (1998), Aortic stenosis, Arthritis, CAD (coronary artery disease), CAP (community acquired pneumonia) (05/21/2017), COPD (chronic obstructive pulmonary disease) ([place]), DJD (degenerative joint disease), lumbar (07/06/2011), Esophageal reflux, HLD (hyperlipidemia), HTN (hypertension), PAD (peripheral artery disease) ([place]), Pneumonia, Renal artery stenosis ([place]), and Urine incontinence. ? ?Vitamin D deficiency ?Last  vitamin D ?Lab Results  ?Component Value Date  ? VD25OH 40.94 02/22/2021  ? ?Stable, cont oral replacement ? ? ?Weight loss ?With less stamina, fatigue - for esr with labs ? ?Encounter for well adult exam with a

## 2021-08-02 NOTE — Patient Instructions (Signed)
Please continue all other medications as before, and refills have been done if requested.  Please have the pharmacy call with any other refills you may need.  Please continue your efforts at being more active, low cholesterol diet, and weight control.  You are otherwise up to date with prevention measures today.  Please keep your appointments with your specialists as you may have planned  Please go to the XRAY Department in the first floor for the x-ray testing  Please go to the LAB at the blood drawing area for the tests to be done  You will be contacted by phone if any changes need to be made immediately.  Otherwise, you will receive a letter about your results with an explanation, but please check with MyChart first.  Please remember to sign up for MyChart if you have not done so, as this will be important to you in the future with finding out test results, communicating by private email, and scheduling acute appointments online when needed.  Please make an Appointment to return in 6 months, or sooner if needed 

## 2021-08-02 NOTE — Assessment & Plan Note (Signed)
Last vitamin D ?Lab Results  ?Component Value Date  ? VD25OH 40.94 02/22/2021  ? ?Stable, cont oral replacement ? ?

## 2021-08-03 ENCOUNTER — Other Ambulatory Visit: Payer: Self-pay | Admitting: Internal Medicine

## 2021-08-03 ENCOUNTER — Telehealth: Payer: Self-pay

## 2021-08-03 ENCOUNTER — Encounter: Payer: Self-pay | Admitting: Internal Medicine

## 2021-08-03 DIAGNOSIS — R911 Solitary pulmonary nodule: Secondary | ICD-10-CM

## 2021-08-03 MED ORDER — LEVOFLOXACIN 500 MG PO TABS: 500.0000 mg | ORAL_TABLET | Freq: Every day | ORAL | 0 refills | Status: AC

## 2021-08-03 NOTE — Telephone Encounter (Signed)
Cheryl with radiology called to report that chest x-ray shows subtle opacity periphery of right upper lung could represent infiltrate subtle nodule not excluded recommend short term follow-up. Full report in EPIC ?

## 2021-08-06 NOTE — Assessment & Plan Note (Signed)
Lab Results  ?Component Value Date  ? HGBA1C 6.9 (H) 08/02/2021  ? ?Mild recent worsening, goal a1c < 7.7,  pt to continue current medical treatment  - diet ? ?

## 2021-08-06 NOTE — Assessment & Plan Note (Signed)
Lab Results  ?Component Value Date  ? [place] 63 08/02/2021  ? ?Stable, pt to continue current statin crestor ? ?

## 2021-08-06 NOTE — Assessment & Plan Note (Signed)
Lab Results  ?Component Value Date  ? CREATININE 1.22 08/02/2021  ? ?Stable overall, cont to avoid nephrotoxins ? ?

## 2021-08-06 NOTE — Assessment & Plan Note (Signed)
Age and sex appropriate education and counseling updated with regular exercise and diet Referrals for preventative services - none needed Immunizations addressed - declines covid booster, and shingrix Smoking counseling  - none needed Evidence for depression or other mood disorder - none significant Most recent labs reviewed. I have personally reviewed and have noted: 1) the patient's medical and social history 2) The patient's current medications and supplements 3) The patient's height, weight, and BMI have been recorded in the chart  

## 2021-08-06 NOTE — Assessment & Plan Note (Signed)
/ ?  BP Readings from Last 3 Encounters:  ?08/02/21 118/78  ?02/22/21 111/78  ?01/27/21 136/78  ? ?Stable, pt to continue medical treatment  - diet, low salt, wt control ? ?

## 2021-08-06 NOTE — Assessment & Plan Note (Signed)
?   Clinical significance, for cxr ?

## 2021-08-06 NOTE — Assessment & Plan Note (Signed)
Stable, cont inhaler prn 

## 2021-08-26 ENCOUNTER — Ambulatory Visit
Admission: RE | Admit: 2021-08-26 | Discharge: 2021-08-26 | Disposition: A | Discharge status: Home / Self Care | Payer: Medicare PPO | Source: Ambulatory Visit | Attending: Internal Medicine | Admitting: Internal Medicine

## 2021-08-26 DIAGNOSIS — R911 Solitary pulmonary nodule: Secondary | ICD-10-CM

## 2021-08-26 DIAGNOSIS — J439 Emphysema, unspecified: Secondary | ICD-10-CM | POA: Diagnosis not present

## 2021-08-26 DIAGNOSIS — I700 Atherosclerosis of aorta: Secondary | ICD-10-CM | POA: Diagnosis not present

## 2021-08-29 ENCOUNTER — Telehealth: Payer: Self-pay | Admitting: Internal Medicine

## 2021-08-29 ENCOUNTER — Other Ambulatory Visit: Payer: Self-pay | Admitting: Internal Medicine

## 2021-08-29 ENCOUNTER — Encounter: Payer: Self-pay | Admitting: Internal Medicine

## 2021-08-29 DIAGNOSIS — N2889 Other specified disorders of kidney and ureter: Secondary | ICD-10-CM

## 2021-08-29 DIAGNOSIS — R918 Other nonspecific abnormal finding of lung field: Secondary | ICD-10-CM

## 2021-08-29 NOTE — Telephone Encounter (Signed)
Ray calls today with [facility]iology with a report for PT. Ray can be contacted for that report with  CB: [phone]

## 2021-08-30 NOTE — Telephone Encounter (Signed)
MD received CT results and mail pt letter. Closing encounter.Marland KitchenJohny [name]

## 2021-09-01 ENCOUNTER — Ambulatory Visit (INDEPENDENT_AMBULATORY_CARE_PROVIDER_SITE_OTHER): Payer: Medicare PPO

## 2021-09-01 VITALS — BP 116/60 | HR 74 | Temp 97.6°F | Ht 66.5 in | Wt 122.6 lb

## 2021-09-01 DIAGNOSIS — Z0000 Encounter for general adult medical examination without abnormal findings: Secondary | ICD-10-CM

## 2021-09-01 NOTE — Progress Notes (Signed)
Subjective:   Kristopher [name] is a 88 y.o. male who presents for Medicare Annual/Subsequent preventive examination.  Review of Systems     Cardiac Risk Factors include: advanced age (>30mn, >>8women);dyslipidemia;family history of premature cardiovascular disease;male gender     Objective:    Today's Vitals   09/01/21 1145  BP: 116/60  Pulse: 74  Temp: 97.6 F (36.4 C)  SpO2: 99%  Weight: 122 lb 9.6 oz (55.6 kg)  Height: 5' 6.5" (1.689 m)  PainSc: 0-No pain   Body mass index is 19.49 kg/m.     09/01/2021   12:07 PM 08/31/2020    1:39 PM 12/17/2019   10:00 PM 12/17/2019   10:20 AM 05/21/2017    8:39 PM 05/21/2017   12:36 PM 01/25/2017    9:00 AM  Advanced Directives  Does Patient Have a Medical Advance Directive? Yes Yes No No No No No  Type of Advance Directive Living will;Healthcare Power of Attorney Living will       Does patient want to make changes to medical advance directive? No - Patient declined No - Patient declined       Copy of [place] Chart? No - copy requested        Would patient like information on creating a medical advance directive?   No - Patient declined No - Patient declined No - Patient declined      Current Medications (verified) Outpatient Encounter Medications as of 09/01/2021  Medication Sig   Ascorbic Acid (VITAMIN C) 500 MG tablet Take 500 mg by mouth 2 (two) times daily.   aspirin 81 MG tablet Take 1 tablet (81 mg total) by mouth daily.   BEE POLLEN PO Take 1 capsule by mouth 2 (two) times daily.    Cholecalciferol (VITAMIN D3) 50 MCG (2000 UT) TABS Take by mouth.   cyclobenzaprine (FLEXERIL) 5 MG tablet TAKE 1 TABLET(5 MG) BY MOUTH AT BEDTIME AS NEEDED FOR MUSCLE SPASMS (Patient taking differently: Take 5 mg by mouth at bedtime as needed for muscle spasms.)   diclofenac sodium (VOLTAREN) 1 % GEL APPLY 2 GRAMS TOPICALLY FOUR TIMES A DAY AS NEEDED. (Patient taking differently: Apply 2 g topically 4 (four) times daily as  needed (joint pain).)   Docusate Calcium (STOOL SOFTENER PO) Take 2 capsules by mouth daily.   Multiple Vitamin (MULTIVITAMIN) capsule Take 1 capsule by mouth daily.   Multiple Vitamins-Minerals (PRESERVISION AREDS PO) Take 2 tablets by mouth daily.   nitroGLYCERIN (NITROSTAT) 0.4 MG SL tablet Place 1 tablet (0.4 mg total) under the tongue every 5 (five) minutes as needed for chest pain.   NON FORMULARY Superfood daily   omeprazole (PRILOSEC) 40 MG capsule Take 1 capsule (40 mg total) by mouth in the morning and at bedtime.   OVER THE COUNTER MEDICATION Take 2 capsules by mouth every morning. NITRIC OXIDE   rosuvastatin (CRESTOR) 40 MG tablet TAKE 1 TABLET DAILY   No facility-administered encounter medications on file as of 09/01/2021.    Allergies (verified) Influenza vaccines, Influenza virus vaccine, and Sulfa antibiotics   History: Past Medical History:  Diagnosis Date   AAA (abdominal aortic aneurysm) ([facility]    ultrasound 07/2011:  2.7 x 2.8 cm   Acute myocardial infarction, unspecified site, episode of care unspecified 1998   Aortic stenosis    echo 4/12: normal LVF, mild LAE, no significant AS   Arthritis    "all over" (05/21/2017)   CAD (coronary artery disease)  s/p PCI of LAD 1998;  s/p DES to RCA 05/2009;  Myoview 4/13: low risk, no ischemia, EF 56%   CAP (community acquired pneumonia) 05/21/2017   COPD (chronic obstructive pulmonary disease) (HCC)    DJD (degenerative joint disease), lumbar 07/06/2011   Esophageal reflux    HLD (hyperlipidemia)    HTN (hypertension)    PAD (peripheral artery disease) (HCC)    s/p L CIA stent 09/2011 ([place])   Pneumonia    "I've always had pneumonia; since I was 88 months old" (05/21/2017)   Renal artery stenosis (HCC)    s/p bilat RA stents   Urine incontinence    Past Surgical History:  Procedure Laterality Date   CATARACT EXTRACTION W/ INTRAOCULAR LENS  IMPLANT, BILATERAL Bilateral YRS AGO   CORONARY ANGIOPLASTY WITH STENT  PLACEMENT  05/1996   PCI of LAD; "I've got a total of 4 stents" (05/21/2017)   [place]   LOWER EXTREMITY ANGIOGRAM Bilateral 09/20/2011   Procedure: LOWER EXTREMITY ANGIOGRAM;  Surgeon: Wellington Hampshire, MD;  Location: [facility];  Service: Cardiovascular;  Laterality: Bilateral;   PACEMAKER IMPLANT N/A 12/17/2019   Procedure: PACEMAKER IMPLANT;  Surgeon: Vickie Epley, MD;  Location: [facility];  Service: Cardiovascular;  Laterality: N/A;   TRANSURETHRAL RESECTION OF PROSTATE  08/11/2011   Procedure: TRANSURETHRAL RESECTION OF THE PROSTATE WITH GYRUS INSTRUMENTS;  Surgeon: Fredricka Bonine, MD;  Location: [facility];  Service: Urology;  Laterality: N/A;      VASECTOMY  1968   Family History  Problem Relation Age of Onset   Heart disease Mother    Heart disease Father    Social History   Socioeconomic History   Marital status: Widowed    Spouse name: Not on file   Number of children: Not on file   Years of education: GED   Highest education level: Not on file  Occupational History   Occupation: retired  Tobacco Use   Smoking status: Former    Packs/day: 3.00    Years: 38.00    Pack years: 114.00    Types: Cigarettes    Quit date: 04/10/1988    Years since quitting: 33.4   Smokeless tobacco: Never  Vaping Use   Vaping Use: Never used  Substance and Sexual Activity   Alcohol use: Yes    Comment: 05/21/2017 "might drink 6 beers/year"   Drug use: No   Sexual activity: Not on file  Other Topics Concern   Not on file  Social History Narrative   Not on file   Social Determinants of Health   Financial Resource Strain: Low Risk    Difficulty of Paying Living Expenses: Not hard at all  Food Insecurity: No Food Insecurity   Worried About Charity fundraiser in the Last Year: Never true   [place] in the Last Year: Never true  Transportation Needs: No Transportation Needs   Lack of Transportation (Medical): No   Lack of Transportation  (Non-Medical): No  Physical Activity: Sufficiently Active   Days of Exercise per Week: 5 days   Minutes of Exercise per Session: 30 min  Stress: No Stress Concern Present   Feeling of Stress : Not at all  Social Connections: Moderately Integrated   Frequency of Communication with Friends and Family: More than three times a week   Frequency of Social Gatherings with Friends and Family: More than three times a week   Attends Religious Services: 1 to 4  times per year   Active Member of Clubs or Organizations: No   Attends Archivist Meetings: 1 to 4 times per year   Marital Status: Widowed    Tobacco Counseling Counseling given: Not Answered   Clinical Intake:  Pre-visit preparation completed: Yes  Pain : No/denies pain Pain Score: 0-No pain     BMI - recorded: 19.49 Nutritional Status: BMI of 19-24  Normal Nutritional Risks: None Diabetes: No  How often do you need to have someone help you when you read instructions, pamphlets, or other written materials from your doctor or pharmacy?: 1 - Never What is the last grade level you completed in school?: HSG; Retired from [employer]  Diabetic? no  Interpreter Needed?: No  Information entered by :: Lisette Abu, LPN   Activities of Daily Living    09/01/2021   12:08 PM  In your present state of health, do you have any difficulty performing the following activities:  Hearing? 0  Vision? 0  Difficulty concentrating or making decisions? 0  Walking or climbing stairs? 0  Dressing or bathing? 0  Doing errands, shopping? 0  Preparing Food and eating ? N  Using the Toilet? N  In the past six months, have you accidently leaked urine? N  Do you have problems with loss of bowel control? N  Managing your Medications? N  Managing your Finances? N  Housekeeping or managing your Housekeeping? N    Patient Care Team: [name], MD as PCP - General (Internal Medicine) Stanford Breed Denice Bors, MD as PCP - Cardiology  (Cardiology) Vickie Epley, MD as PCP - Electrophysiology (Cardiology) Stanford Breed Denice Bors, MD as Consulting Physician (Cardiology) Calvert Cantor, MD as Consulting Physician (Ophthalmology) Letta Pate Luanna Salk, MD as Consulting Physician (Pain Medicine)  Indicate any recent Medical Services you may have received from other than [facility] providers in the past year (date may be approximate).     Assessment:   This is a routine wellness examination for [place].  Hearing/Vision screen No results found.  Dietary issues and exercise activities discussed: Current Exercise Habits: Home exercise routine, Type of exercise: walking, Time (Minutes): 30, Frequency (Times/Week): 5, Weekly Exercise (Minutes/Week): 150, Intensity: Mild, Exercise limited by: respiratory conditions(s);orthopedic condition(s)   Goals Addressed             This Visit's Progress    Stay independent and active.        Depression Screen    09/01/2021   12:00 PM 08/02/2021   11:30 AM 08/02/2021   11:14 AM 02/22/2021    9:52 AM 01/27/2021   11:41 AM 08/31/2020    1:38 PM 07/28/2020   11:21 AM  PHQ 2/9 Scores  PHQ - 2 Score 0 0 0 0 0 0 0  PHQ- 9 Score   3        Fall Risk    09/01/2021   12:08 PM 08/02/2021   11:30 AM 08/02/2021   11:14 AM 02/22/2021    9:51 AM 01/27/2021   11:41 AM  [place] in the past year? 0 0 0 0 0  Number falls in past yr: 0 0 0 0 0  Injury with Fall? 0 0 0  0  Risk for fall due to : No Fall Risks      Follow up Falls evaluation completed        FALL RISK PREVENTION PERTAINING TO THE HOME:  Any stairs in or around the home? Yes  If  so, are there any without handrails? No  Home free of loose throw rugs in walkways, pet beds, electrical cords, etc? Yes  Adequate lighting in your home to reduce risk of falls? Yes   ASSISTIVE DEVICES UTILIZED TO PREVENT FALLS:  Life alert? No  Use of a cane, walker or w/c? Yes  Grab bars in the bathroom? No  Shower chair or bench in  shower? Yes  Elevated toilet seat or a handicapped toilet? Yes   TIMED UP AND GO:  Was the test performed? Yes .  Length of time to ambulate 10 feet: 10 sec.   Gait steady and fast with assistive device  Cognitive Function:    05/05/2016    8:26 AM  MMSE - Mini Mental State Exam  Orientation to time 5  Orientation to Place 5  Registration 3  Attention/ Calculation 5  Recall 2  Language- name 2 objects 2  Language- repeat 1  Language- follow 3 step command 3  Language- read & follow direction 1  Write a sentence 1  Copy design 1  Total score 29        09/01/2021   12:11 PM  6CIT Screen  What Year? 0 points  What month? 0 points  What time? 0 points  Count back from 20 0 points  Months in reverse 0 points  Repeat phrase 0 points  Total Score 0 points    Immunizations Immunization History  Administered Date(s) Administered   PFIZER(Purple Top)SARS-COV-2 Vaccination 08/06/2019, 08/26/2019, 07/07/2020   Pneumococcal Conjugate-13 01/15/2014   Pneumococcal Polysaccharide-23 01/26/2016   Tdap 11/30/2017   Zoster, Live 09/16/2012    TDAP status: Up to date  Flu Vaccine status: Declined, Education has been provided regarding the importance of this vaccine but patient still declined. Advised may receive this vaccine at local pharmacy or Health Dept. Aware to provide a copy of the vaccination record if obtained from local pharmacy or Health Dept. Verbalized acceptance and understanding.  Pneumococcal vaccine status: Up to date  Covid-19 vaccine status: Completed vaccines  Qualifies for Shingles Vaccine? Yes   Zostavax completed Yes   Shingrix Completed?: No.    Education has been provided regarding the importance of this vaccine. Patient has been advised to call insurance company to determine out of pocket expense if they have not yet received this vaccine. Advised may also receive vaccine at local pharmacy or Health Dept. Verbalized acceptance and  understanding.  Screening Tests Health Maintenance  Topic Date Due   COVID-19 Vaccine (4 - Booster for Pfizer series) 09/01/2020   Zoster Vaccines- Shingrix (1 of 2) 11/01/2021 (Originally 09/24/1949)   TETANUS/TDAP  12/01/2027   Pneumonia Vaccine 75+ Years old  Completed   HPV VACCINES  Aged Out    Health Maintenance  Health Maintenance Due  Topic Date Due   COVID-19 Vaccine (4 - Booster for Pfizer series) 09/01/2020    Colorectal cancer screening: No longer required.   Lung Cancer Screening: (Low Dose CT Chest recommended if Age 90-80 years, 30 pack-year currently smoking OR have quit w/in 15years.) does not qualify.   Lung Cancer Screening Referral: no  Additional Screening:  Hepatitis C Screening: does not qualify; Completed no  Vision Screening: Recommended annual ophthalmology exams for early detection of glaucoma and other disorders of the eye. Is the patient up to date with their annual eye exam?  Yes  Who is the provider or what is the name of the office in which the patient attends annual eye exams? [facility] If pt is not established with a provider, would they like to be referred to a provider to establish care? No .   Dental Screening: Recommended annual dental exams for proper oral hygiene  Community Resource Referral / Chronic Care Management: CRR required this visit?  No   CCM required this visit?  No      Plan:     I have personally reviewed and noted the following in the patient's chart:   Medical and social history Use of alcohol, tobacco or illicit drugs  Current medications and supplements including opioid prescriptions. Patient is not currently taking opioid prescriptions. Functional ability and status Nutritional status Physical activity Advanced directives List of other physicians Hospitalizations, surgeries, and ER visits in previous 12 months Vitals Screenings to include cognitive, depression, and falls Referrals and  appointments  In addition, I have reviewed and discussed with patient certain preventive protocols, quality metrics, and best practice recommendations. A written personalized care plan for preventive services as well as general preventive health recommendations were provided to patient.     Sheral Flow, LPN   [id number]   Nurse Notes:  Normal cognitive status assessed by direct observation by this Nurse Health Advisor. No abnormalities found.

## 2021-09-01 NOTE — Patient Instructions (Signed)
Kristopher [name] , Thank you for taking time to come for your Medicare Wellness Visit. I appreciate your ongoing commitment to your health goals. Please review the following plan we discussed and let me know if I can assist you in the future.   Screening recommendations/referrals: Colonoscopy: Discontinued due to age Recommended yearly ophthalmology/optometry visit for glaucoma screening and checkup Recommended yearly dental visit for hygiene and checkup  Vaccinations: Influenza vaccine: declined Pneumococcal vaccine: 01/15/2014, 01/26/2016 Tdap vaccine: 11/30/2017; due every 10 years Shingles vaccine: never done   Covid-19: 08/06/2019, 08/26/2019, 07/07/2020  Advanced directives: Yes; needs updating  Conditions/risks identified: Yes  Next appointment: Please schedule your next Medicare Wellness Visit with your Nurse Health Advisor in 1 year by calling [phone].  Preventive Care 65 Years and Older, Male Preventive care refers to lifestyle choices and visits with your health care provider that can promote health and wellness. What does preventive care include? A yearly physical exam. This is also called an annual well check. Dental exams once or twice a year. Routine eye exams. Ask your health care provider how often you should have your eyes checked. Personal lifestyle choices, including: Daily care of your teeth and gums. Regular physical activity. Eating a healthy diet. Avoiding tobacco and drug use. Limiting alcohol use. Practicing safe sex. Taking low doses of aspirin every day. Taking vitamin and mineral supplements as recommended by your health care provider. What happens during an annual well check? The services and screenings done by your health care provider during your annual well check will depend on your age, overall health, lifestyle risk factors, and family history of disease. Counseling  Your health care provider may ask you questions about your: Alcohol use. Tobacco  use. Drug use. Emotional well-being. Home and relationship well-being. Sexual activity. Eating habits. History of falls. Memory and ability to understand (cognition). Work and work Statistician. Screening  You may have the following tests or measurements: Height, weight, and BMI. Blood pressure. Lipid and cholesterol levels. These may be checked every 5 years, or more frequently if you are over 18 years old. Skin check. Lung cancer screening. You may have this screening every year starting at age 40 if you have a 30-pack-year history of smoking and currently smoke or have quit within the past 15 years. Fecal occult blood test (FOBT) of the stool. You may have this test every year starting at age 79. Flexible sigmoidoscopy or colonoscopy. You may have a sigmoidoscopy every 5 years or a colonoscopy every 10 years starting at age 36. Prostate cancer screening. Recommendations will vary depending on your family history and other risks. Hepatitis C blood test. Hepatitis B blood test. Sexually transmitted disease (STD) testing. Diabetes screening. This is done by checking your blood sugar (glucose) after you have not eaten for a while (fasting). You may have this done every 1-3 years. Abdominal aortic aneurysm (AAA) screening. You may need this if you are a current or former smoker. Osteoporosis. You may be screened starting at age 102 if you are at high risk. Talk with your health care provider about your test results, treatment options, and if necessary, the need for more tests. Vaccines  Your health care provider may recommend certain vaccines, such as: Influenza vaccine. This is recommended every year. Tetanus, diphtheria, and acellular pertussis (Tdap, Td) vaccine. You may need a Td booster every 10 years. Zoster vaccine. You may need this after age 42. Pneumococcal 13-valent conjugate (PCV13) vaccine. One dose is recommended after age 55. Pneumococcal polysaccharide (PPSV23)  vaccine.  One dose is recommended after age 77. Talk to your health care provider about which screenings and vaccines you need and how often you need them. This information is not intended to replace advice given to you by your health care provider. Make sure you discuss any questions you have with your health care provider. Document Released: 04/23/2015 Document Revised: 12/15/2015 Document Reviewed: 01/26/2015 Elsevier Interactive Patient Education  2017 [place] Prevention in the Home Falls can cause injuries. They can happen to people of all ages. There are many things you can do to make your home safe and to help prevent falls. What can I do on the outside of my home? Regularly fix the edges of walkways and driveways and fix any cracks. Remove anything that might make you trip as you walk through a door, such as a raised step or threshold. Trim any bushes or trees on the path to your home. Use bright outdoor lighting. Clear any walking paths of anything that might make someone trip, such as rocks or tools. Regularly check to see if handrails are loose or broken. Make sure that both sides of any steps have handrails. Any raised decks and porches should have guardrails on the edges. Have any leaves, snow, or ice cleared regularly. Use sand or salt on walking paths during winter. Clean up any spills in your garage right away. This includes oil or grease spills. What can I do in the bathroom? Use night lights. Install grab bars by the toilet and in the tub and shower. Do not use towel bars as grab bars. Use non-skid mats or decals in the tub or shower. If you need to sit down in the shower, use a plastic, non-slip stool. Keep the floor dry. Clean up any water that spills on the floor as soon as it happens. Remove soap buildup in the tub or shower regularly. Attach bath mats securely with double-sided non-slip rug tape. Do not have throw rugs and other things on the floor that can make  you trip. What can I do in the bedroom? Use night lights. Make sure that you have a light by your bed that is easy to reach. Do not use any sheets or blankets that are too big for your bed. They should not hang down onto the floor. Have a firm chair that has side arms. You can use this for support while you get dressed. Do not have throw rugs and other things on the floor that can make you trip. What can I do in the kitchen? Clean up any spills right away. Avoid walking on wet floors. Keep items that you use a lot in easy-to-reach places. If you need to reach something above you, use a strong step stool that has a grab bar. Keep electrical cords out of the way. Do not use floor polish or wax that makes floors slippery. If you must use wax, use non-skid floor wax. Do not have throw rugs and other things on the floor that can make you trip. What can I do with my stairs? Do not leave any items on the stairs. Make sure that there are handrails on both sides of the stairs and use them. Fix handrails that are broken or loose. Make sure that handrails are as long as the stairways. Check any carpeting to make sure that it is firmly attached to the stairs. Fix any carpet that is loose or worn. Avoid having throw rugs at the top or bottom  of the stairs. If you do have throw rugs, attach them to the floor with carpet tape. Make sure that you have a light switch at the top of the stairs and the bottom of the stairs. If you do not have them, ask someone to add them for you. What else can I do to help prevent falls? Wear shoes that: Do not have high heels. Have rubber bottoms. Are comfortable and fit you well. Are closed at the toe. Do not wear sandals. If you use a stepladder: Make sure that it is fully opened. Do not climb a closed stepladder. Make sure that both sides of the stepladder are locked into place. Ask someone to hold it for you, if possible. Clearly mark and make sure that you can  see: Any grab bars or handrails. First and last steps. Where the edge of each step is. Use tools that help you move around (mobility aids) if they are needed. These include: Canes. Walkers. Scooters. Crutches. Turn on the lights when you go into a dark area. Replace any light bulbs as soon as they burn out. Set up your furniture so you have a clear path. Avoid moving your furniture around. If any of your floors are uneven, fix them. If there are any pets around you, be aware of where they are. Review your medicines with your doctor. Some medicines can make you feel dizzy. This can increase your chance of falling. Ask your doctor what other things that you can do to help prevent falls. This information is not intended to replace advice given to you by your health care provider. Make sure you discuss any questions you have with your health care provider. Document Released: 01/21/2009 Document Revised: 09/02/2015 Document Reviewed: 05/01/2014 Elsevier Interactive Patient Education  2017 [employer].

## 2021-09-13 ENCOUNTER — Encounter: Payer: Self-pay | Admitting: Emergency Medicine

## 2021-09-13 ENCOUNTER — Ambulatory Visit (INDEPENDENT_AMBULATORY_CARE_PROVIDER_SITE_OTHER): Payer: Medicare PPO | Admitting: Emergency Medicine

## 2021-09-13 DIAGNOSIS — J439 Emphysema, unspecified: Secondary | ICD-10-CM

## 2021-09-13 DIAGNOSIS — R918 Other nonspecific abnormal finding of lung field: Secondary | ICD-10-CM | POA: Insufficient documentation

## 2021-09-13 DIAGNOSIS — J9611 Chronic respiratory failure with hypoxia: Secondary | ICD-10-CM

## 2021-09-13 MED ORDER — SPIRIVA RESPIMAT 1.25 MCG/ACT IN AERS
2.0000 | INHALATION_SPRAY | Freq: Every day | RESPIRATORY_TRACT | 0 refills | Status: DC
Start: 2021-09-13 — End: 2023-01-01

## 2021-09-13 MED ORDER — SPIRIVA RESPIMAT 1.25 MCG/ACT IN AERS: 2.0000 | INHALATION_SPRAY | Freq: Every day | RESPIRATORY_TRACT | 5 refills | Status: DC

## 2021-09-13 NOTE — Addendum Note (Signed)
Addended by: Gavin Potters R on: 09/13/2021 12:01 PM   Modules accepted: Orders

## 2021-09-13 NOTE — Patient Instructions (Signed)
We reviewed your CT scan of the chest today.  There is severe emphysema present.  There are also small pulmonary nodules that will need to be followed. We will repeat your CT scan of the chest in 3 months, then follow-up to review We will try starting a new medication Spiriva, 2 puffs once daily.  Keep track of whether this medicine helps your breathing.  If so please call our office and we will send a prescription to your pharmacy. We will perform walking oximetry today to see if you qualify for a portable oxygen concentrator. Follow Dr. Lamonte Sakai in 3 months after your CT scan so we can review.  Call sooner if you have any problems.

## 2021-09-13 NOTE — Assessment & Plan Note (Signed)
Small pulmonary nodules noted, superimposed on his severe emphysema, on CT scan of the chest 08/26/2021.  Etiology unclear, could be benign, could reflect early malignancy.  We will have to tailor the work-up, any possible treatment options to his age, somewhat debilitated state.  Unclear to me whether he would be a good candidate for bronchoscopy if the nodules involved due to his severe surrounding emphysema.  He may be a candidate for empiric SBRT depending on suspicion as we go forward.  For now he needs a repeat CT scan of the chest to follow for interval change.  We will do this in 3 months.

## 2021-09-13 NOTE — Progress Notes (Signed)
Subjective:    Patient ID: Kristopher [name], male    DOB: [birth year], 88 y.o.   MRN: [id number]  HPI 88 year old gentleman, former smoker (115 pack years) with a history of CAD, COPD, AS, AAA, hypertension, 3rd degree block with a pacer, PVD, RAS with stents, GERD with hiatal hernia. He underwent a chest x-ray 08/02/2021 to evaluate persistent cough.  This showed a subtle right upper lobe opacity that prompted CT chest as below. Today he reports that he has baseline exertional SOB, has to stop to rest after climbing stairs. He has chronic bronchitis sx, produces green sputum in the Fall/Spring.  He has O2 at home, but the tanks are too heavy, told to use 2L/min.   CT scan of the chest 08/26/2021 reviewed by me shows no mediastinal or hilar adenopathy.  There is very severe emphysema throughout with some areas of linear scarring.  Focal patchy airspace opacity at the left base 1.4 x 1.7 cm, ill-defined nodular tree-in-bud opacities clustered inferior right upper lobe up to 5 mm, scattered right upper lobe pulmonary nodules elsewhere, calcified granuloma in the left lower lobe as well as the spleen.   Review of Systems As per HPi  Past Medical History:  Diagnosis Date   AAA (abdominal aortic aneurysm) ([place])    ultrasound 07/2011:  2.7 x 2.8 cm   Acute myocardial infarction, unspecified site, episode of care unspecified 1998   Aortic stenosis    echo 4/12: normal LVF, mild LAE, no significant AS   Arthritis    "all over" (05/21/2017)   CAD (coronary artery disease)    s/p PCI of LAD 1998;  s/p DES to RCA 05/2009;  Myoview 4/13: low risk, no ischemia, EF 56%   CAP (community acquired pneumonia) 05/21/2017   COPD (chronic obstructive pulmonary disease) (HCC)    DJD (degenerative joint disease), lumbar 07/06/2011   Esophageal reflux    HLD (hyperlipidemia)    HTN (hypertension)    PAD (peripheral artery disease) (HCC)    s/p L CIA stent 09/2011 ([facility])   Pneumonia    "I've always had  pneumonia; since I was 55 months old" (05/21/2017)   Renal artery stenosis (HCC)    s/p bilat RA stents   Urine incontinence      Family History  Problem Relation Age of Onset   Heart disease Mother    Heart disease Father      Social History   Socioeconomic History   Marital status: Widowed    Spouse name: Not on file   Number of children: Not on file   Years of education: GED   Highest education level: Not on file  Occupational History   Occupation: retired  Tobacco Use   Smoking status: Former    Packs/day: 3.00    Years: 38.00    Pack years: 114.00    Types: Cigarettes    Quit date: 04/10/1988    Years since quitting: 33.4   Smokeless tobacco: Never  Vaping Use   Vaping Use: Never used  Substance and Sexual Activity   Alcohol use: Yes    Comment: 05/21/2017 "might drink 6 beers/year"   Drug use: No   Sexual activity: Not on file  Other Topics Concern   Not on file  Social History Narrative   Not on file   Social Determinants of Health   Financial Resource Strain: Low Risk    Difficulty of Paying Living Expenses: Not hard at all  Food Insecurity: No Food Insecurity  Worried About Charity fundraiser in the Last Year: Never true   [place] in the Last Year: Never true  Transportation Needs: No Transportation Needs   Lack of Transportation (Medical): No   Lack of Transportation (Non-Medical): No  Physical Activity: Sufficiently Active   Days of Exercise per Week: 5 days   Minutes of Exercise per Session: 30 min  Stress: No Stress Concern Present   Feeling of Stress : Not at all  Social Connections: Moderately Integrated   Frequency of Communication with Friends and Family: More than three times a week   Frequency of Social Gatherings with Friends and Family: More than three times a week   Attends Religious Services: 1 to 4 times per year   Active Member of [employer] or Organizations: No   Attends Archivist Meetings: 1 to 4 times per year    Marital Status: Widowed  Human resources officer Violence: Not At Risk   Fear of Current or Ex-Partner: No   Emotionally Abused: No   Physically Abused: No   Sexually Abused: No    He was lived in [place], [place] 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   Allergies  Allergen Reactions   Influenza Vaccines Other (See Comments)    unknown   Influenza Virus Vaccine Other (See Comments)    Several reaction.  Took it once woke up two days later in the hospital.    Sulfa Antibiotics Rash     Outpatient Medications Prior to Visit  Medication Sig Dispense Refill   Ascorbic Acid (VITAMIN C) 500 MG tablet Take 500 mg by mouth 2 (two) times daily.     aspirin 81 MG tablet Take 1 tablet (81 mg total) by mouth daily.     BEE POLLEN PO Take 1 capsule by mouth 2 (two) times daily.      Cholecalciferol (VITAMIN D3) 50 MCG (2000 UT) TABS Take by mouth.     cyclobenzaprine (FLEXERIL) 5 MG tablet TAKE 1 TABLET(5 MG) BY MOUTH AT BEDTIME AS NEEDED FOR MUSCLE SPASMS (Patient taking differently: Take 5 mg by mouth at bedtime as needed for muscle spasms.) 30 tablet 2   diclofenac sodium (VOLTAREN) 1 % GEL APPLY 2 GRAMS TOPICALLY FOUR TIMES A DAY AS NEEDED. (Patient taking differently: Apply 2 g topically 4 (four) times daily as needed (joint pain).) 700 g 4   Docusate Calcium (STOOL SOFTENER PO) Take 2 capsules by mouth daily.     Multiple Vitamins-Minerals (PRESERVISION AREDS PO) Take 2 tablets by mouth daily.     nitroGLYCERIN (NITROSTAT) 0.4 MG SL tablet Place 1 tablet (0.4 mg total) under the tongue every 5 (five) minutes as needed for chest pain. 90 tablet 3   NON FORMULARY Superfood daily     omeprazole (PRILOSEC) 40 MG capsule Take 1 capsule (40 mg total) by mouth in the morning and at bedtime. 180 capsule 3   OVER THE COUNTER MEDICATION Take 2 capsules by mouth every morning. NITRIC OXIDE     rosuvastatin (CRESTOR) 40 MG tablet TAKE 1 TABLET DAILY 90 tablet 3   Multiple  Vitamin (MULTIVITAMIN) capsule Take 1 capsule by mouth daily. (Patient not taking: Reported on 09/13/2021)     No facility-administered medications prior to visit.         Objective:   Physical Exam Vitals:   09/13/21 1027  BP: 118/68  Pulse: 72  Temp: 98.2 F (36.8 C)  TempSrc: Oral  SpO2: 97%  Weight: 124 lb 3.2 oz (56.3 kg)  Height: '5\' 8"'$  (1.727 m)   Gen: Pleasant, very thin elderly man, in no distress,  normal affect  ENT: No lesions,  mouth clear,  oropharynx clear, no postnasal drip  Neck: No JVD, no stridor  Lungs: No use of accessory muscles, very distant.  No wheezing or crackles  Cardiovascular: RRR, heart sounds normal, no murmur or gallops, no peripheral edema  Musculoskeletal: No deformities, no cyanosis or clubbing  Neuro: alert, awake, non focal  Skin: Warm, no lesions or rash      Assessment & Plan:  COPD (chronic obstructive pulmonary disease) (HCC) Profound emphysema on CT chest with a significant heavy tobacco history.  He is not on any bronchodilator therapy, never has been.  He has chronic bronchitic symptoms especially in the spring and fall.  We will do a trial of Spiriva Respimat to see if he gets benefit.  He will likely also need an albuterol inhaler to use if needed for acute symptoms.  Chronic hypoxemic respiratory failure (HCC) He was started on oxygen following hospitalization.  He has portable tanks but does not use these because they are too heavy.  We need to see if he still desaturates, try to qualify him for POC.  He is not sure who his DME is  Pulmonary nodules Small pulmonary nodules noted, superimposed on his severe emphysema, on CT scan of the chest 08/26/2021.  Etiology unclear, could be benign, could reflect early malignancy.  We will have to tailor the work-up, any possible treatment options to his age, somewhat debilitated state.  Unclear to me whether he would be a good candidate for bronchoscopy if the nodules involved due to  his severe surrounding emphysema.  He may be a candidate for empiric SBRT depending on suspicion as we go forward.  For now he needs a repeat CT scan of the chest to follow for interval change.  We will do this in 3 months.   Baltazar Apo, MD, PhD 09/13/2021, 11:07 AM [facility] [phone] or if no answer before 7:00PM call [phone] For any issues after 7:00PM please call eLink [phone]

## 2021-09-13 NOTE — Assessment & Plan Note (Signed)
Profound emphysema on CT chest with a significant heavy tobacco history.  He is not on any bronchodilator therapy, never has been.  He has chronic bronchitic symptoms especially in the spring and fall.  We will do a trial of Spiriva Respimat to see if he gets benefit.  He will likely also need an albuterol inhaler to use if needed for acute symptoms.

## 2021-09-13 NOTE — Assessment & Plan Note (Signed)
He was started on oxygen following hospitalization.  He has portable tanks but does not use these because they are too heavy.  We need to see if he still desaturates, try to qualify him for POC.  He is not sure who his DME is

## 2021-09-23 ENCOUNTER — Ambulatory Visit (INDEPENDENT_AMBULATORY_CARE_PROVIDER_SITE_OTHER): Payer: Medicare PPO

## 2021-09-23 DIAGNOSIS — I442 Atrioventricular block, complete: Secondary | ICD-10-CM

## 2021-09-26 LAB — CUP PACEART REMOTE DEVICE CHECK
Battery Remaining Longevity: 150 mo
Battery Remaining Percentage: 100 %
Brady Statistic RA Percent Paced: 5 %
Brady Statistic RV Percent Paced: 100 %
Date Time Interrogation Session: 20230616010000
Implantable Lead Implant Date: 20210908
Implantable Lead Implant Date: 20210908
Implantable Lead Location: 753859
Implantable Lead Location: 753860
Implantable Lead Model: 7841
Implantable Lead Model: 7842
Implantable Lead Serial Number: 1054557
Implantable Lead Serial Number: 1096552
Implantable Pulse Generator Implant Date: 20210908
Lead Channel Impedance Value: 747 Ohm
Lead Channel Impedance Value: 763 Ohm
Lead Channel Pacing Threshold Amplitude: 0.6 V
Lead Channel Pacing Threshold Amplitude: 0.8 V
Lead Channel Pacing Threshold Pulse Width: 0.4 ms
Lead Channel Pacing Threshold Pulse Width: 0.4 ms
Lead Channel Setting Pacing Amplitude: 1.4 V
Lead Channel Setting Pacing Amplitude: 2.5 V
Lead Channel Setting Pacing Pulse Width: 0.4 ms
Lead Channel Setting Sensing Sensitivity: 3 mV
Pulse Gen Serial Number: 948694

## 2021-09-29 NOTE — Progress Notes (Signed)
Remote pacemaker transmission.   

## 2021-09-30 ENCOUNTER — Telehealth: Payer: Self-pay | Admitting: Emergency Medicine

## 2021-10-06 ENCOUNTER — Telehealth: Payer: Self-pay | Admitting: Emergency Medicine

## 2021-10-06 MED ORDER — SPIRIVA RESPIMAT 1.25 MCG/ACT IN AERS: 2.0000 | INHALATION_SPRAY | Freq: Every day | RESPIRATORY_TRACT | 5 refills | Status: DC

## 2021-10-06 NOTE — Telephone Encounter (Signed)
Called patient and verified medication that were wanting refills on and what pharmacy to send in refills. Nothing further needed

## 2021-11-10 ENCOUNTER — Ambulatory Visit
Admission: RE | Admit: 2021-11-10 | Discharge: 2021-11-10 | Disposition: A | Discharge status: Home / Self Care | Payer: Medicare PPO | Source: Ambulatory Visit | Attending: Emergency Medicine | Admitting: Emergency Medicine

## 2021-11-10 DIAGNOSIS — J439 Emphysema, unspecified: Secondary | ICD-10-CM | POA: Diagnosis not present

## 2021-11-10 DIAGNOSIS — R918 Other nonspecific abnormal finding of lung field: Secondary | ICD-10-CM | POA: Diagnosis not present

## 2021-11-10 DIAGNOSIS — R911 Solitary pulmonary nodule: Secondary | ICD-10-CM | POA: Diagnosis not present

## 2021-11-23 DIAGNOSIS — R351 Nocturia: Secondary | ICD-10-CM | POA: Diagnosis not present

## 2021-11-23 DIAGNOSIS — N401 Enlarged prostate with lower urinary tract symptoms: Secondary | ICD-10-CM | POA: Diagnosis not present

## 2021-11-23 DIAGNOSIS — N281 Cyst of kidney, acquired: Secondary | ICD-10-CM | POA: Diagnosis not present

## 2021-12-02 ENCOUNTER — Other Ambulatory Visit: Payer: Self-pay | Admitting: Internal Medicine

## 2021-12-02 NOTE — Telephone Encounter (Signed)
Please refill as per office routine med refill policy (all routine meds to be refilled for 3 mo or monthly (per pt preference) up to one year from last visit, then month to month grace period for 3 mo, then further med refills will have to be denied) ? ?

## 2021-12-19 ENCOUNTER — Other Ambulatory Visit: Payer: Self-pay

## 2021-12-19 DIAGNOSIS — R918 Other nonspecific abnormal finding of lung field: Secondary | ICD-10-CM

## 2021-12-19 NOTE — Progress Notes (Signed)
R

## 2021-12-23 ENCOUNTER — Ambulatory Visit (INDEPENDENT_AMBULATORY_CARE_PROVIDER_SITE_OTHER): Payer: Medicare PPO

## 2021-12-23 DIAGNOSIS — I442 Atrioventricular block, complete: Secondary | ICD-10-CM

## 2021-12-26 LAB — CUP PACEART REMOTE DEVICE CHECK
Battery Remaining Longevity: 144 mo
Battery Remaining Percentage: 100 %
Brady Statistic RA Percent Paced: 5 %
Brady Statistic RV Percent Paced: 100 %
Date Time Interrogation Session: 20230915010100
Implantable Lead Implant Date: 20210908
Implantable Lead Implant Date: 20210908
Implantable Lead Location: 753859
Implantable Lead Location: 753860
Implantable Lead Model: 7841
Implantable Lead Model: 7842
Implantable Lead Serial Number: 1054557
Implantable Lead Serial Number: 1096552
Implantable Pulse Generator Implant Date: 20210908
Lead Channel Impedance Value: 707 Ohm
Lead Channel Impedance Value: 805 Ohm
Lead Channel Pacing Threshold Amplitude: 0.5 V
Lead Channel Pacing Threshold Amplitude: 0.9 V
Lead Channel Pacing Threshold Pulse Width: 0.4 ms
Lead Channel Pacing Threshold Pulse Width: 0.4 ms
Lead Channel Setting Pacing Amplitude: 1.4 V
Lead Channel Setting Pacing Amplitude: 2.5 V
Lead Channel Setting Pacing Pulse Width: 0.4 ms
Lead Channel Setting Sensing Sensitivity: 3 mV
Pulse Gen Serial Number: 948694

## 2022-01-03 NOTE — Progress Notes (Signed)
Remote pacemaker transmission.   

## 2022-02-01 ENCOUNTER — Ambulatory Visit (INDEPENDENT_AMBULATORY_CARE_PROVIDER_SITE_OTHER): Payer: Medicare PPO | Admitting: Internal Medicine

## 2022-02-01 ENCOUNTER — Encounter: Payer: Self-pay | Admitting: Internal Medicine

## 2022-02-01 VITALS — BP 126/72 | HR 67 | Temp 97.7°F | Ht 68.0 in | Wt 124.0 lb

## 2022-02-01 DIAGNOSIS — E7800 Pure hypercholesterolemia, unspecified: Secondary | ICD-10-CM | POA: Diagnosis not present

## 2022-02-01 DIAGNOSIS — E559 Vitamin D deficiency, unspecified: Secondary | ICD-10-CM | POA: Diagnosis not present

## 2022-02-01 DIAGNOSIS — Z23 Encounter for immunization: Secondary | ICD-10-CM | POA: Diagnosis not present

## 2022-02-01 DIAGNOSIS — I10 Essential (primary) hypertension: Secondary | ICD-10-CM

## 2022-02-01 DIAGNOSIS — E1165 Type 2 diabetes mellitus with hyperglycemia: Secondary | ICD-10-CM

## 2022-02-01 DIAGNOSIS — E538 Deficiency of other specified B group vitamins: Secondary | ICD-10-CM | POA: Diagnosis not present

## 2022-02-01 LAB — CBC WITH DIFFERENTIAL/PLATELET
Basophils Absolute: 0.1 10*3/uL (ref 0.0–0.1)
Basophils Relative: 1 % (ref 0.0–3.0)
Eosinophils Absolute: 0.3 10*3/uL (ref 0.0–0.7)
Eosinophils Relative: 4.1 % (ref 0.0–5.0)
HCT: 32.8 % — ABNORMAL LOW (ref 39.0–52.0)
Hemoglobin: 10.6 g/dL — ABNORMAL LOW (ref 13.0–17.0)
Lymphocytes Relative: 21.7 % (ref 12.0–46.0)
Lymphs Abs: 1.9 10*3/uL (ref 0.7–4.0)
MCHC: 32.3 g/dL (ref 30.0–36.0)
MCV: 84.8 fl (ref 78.0–100.0)
Monocytes Absolute: 0.9 10*3/uL (ref 0.1–1.0)
Monocytes Relative: 10 % (ref 3.0–12.0)
Neutro Abs: 5.4 10*3/uL (ref 1.4–7.7)
Neutrophils Relative %: 63.2 % (ref 43.0–77.0)
Platelets: 220 10*3/uL (ref 150.0–400.0)
RBC: 3.86 Mil/uL — ABNORMAL LOW (ref 4.22–5.81)
RDW: 19.4 % — ABNORMAL HIGH (ref 11.5–15.5)
WBC: 8.6 10*3/uL (ref 4.0–10.5)

## 2022-02-01 LAB — BASIC METABOLIC PANEL
BUN: 35 mg/dL — ABNORMAL HIGH (ref 6–23)
CO2: 26 mEq/L (ref 19–32)
Calcium: 9.3 mg/dL (ref 8.4–10.5)
Chloride: 106 mEq/L (ref 96–112)
Creatinine, Ser: 1.54 mg/dL — ABNORMAL HIGH (ref 0.40–1.50)
GFR: 39.79 mL/min — ABNORMAL LOW (ref >60.00)
Glucose, Bld: 121 mg/dL — ABNORMAL HIGH (ref 70–99)
Potassium: 4.2 mEq/L (ref 3.5–5.1)
Sodium: 139 mEq/L (ref 135–145)

## 2022-02-01 LAB — HEPATIC FUNCTION PANEL
ALT: 16 U/L (ref 0–53)
AST: 28 U/L (ref 0–37)
Albumin: 3.9 g/dL (ref 3.5–5.2)
Alkaline Phosphatase: 86 U/L (ref 39–117)
Bilirubin, Direct: 0.1 mg/dL (ref 0.0–0.3)
Total Bilirubin: 0.3 mg/dL (ref 0.2–1.2)
Total Protein: 6.8 g/dL (ref 6.0–8.3)

## 2022-02-01 LAB — LIPID PANEL
Cholesterol: 125 mg/dL (ref 0–200)
HDL: 52 mg/dL (ref >39.00)
LDL Cholesterol: 53 mg/dL (ref 0–99)
NonHDL: 73.21
Total CHOL/HDL Ratio: 2
Triglycerides: 100 mg/dL (ref 0.0–149.0)
VLDL: 20 mg/dL (ref 0.0–40.0)

## 2022-02-01 LAB — VITAMIN B12: Vitamin B-12: 1338 pg/mL — ABNORMAL HIGH (ref 211–911)

## 2022-02-01 LAB — URINALYSIS, ROUTINE W REFLEX MICROSCOPIC
Bilirubin Urine: NEGATIVE
Hgb urine dipstick: NEGATIVE
Ketones, ur: NEGATIVE
Leukocytes,Ua: NEGATIVE
Nitrite: NEGATIVE
RBC / HPF: NONE SEEN (ref 0–?)
Specific Gravity, Urine: 1.02 (ref 1.000–1.030)
Urine Glucose: NEGATIVE
Urobilinogen, UA: 0.2 (ref 0.0–1.0)
pH: 5.5 (ref 5.0–8.0)

## 2022-02-01 LAB — VITAMIN D 25 HYDROXY (VIT D DEFICIENCY, FRACTURES): VITD: 53.5 ng/mL (ref 30.00–100.00)

## 2022-02-01 LAB — HEMOGLOBIN A1C: Hgb A1c MFr Bld: 6.8 % — ABNORMAL HIGH (ref 4.6–6.5)

## 2022-02-01 LAB — TSH: TSH: 3.74 u[IU]/mL (ref 0.35–5.50)

## 2022-02-01 MED ORDER — VITAMIN B-12 1000 MCG PO TABS: 1000.0000 ug | ORAL_TABLET | Freq: Every day | ORAL | 3 refills | Status: AC

## 2022-02-01 NOTE — Assessment & Plan Note (Signed)
Last vitamin D Lab Results  Component Value Date   VD25OH 50.52 08/02/2021   Stable, cont oral replacement

## 2022-02-01 NOTE — Progress Notes (Signed)
Patient ID: Kristopher [name], male   DOB: 1930-05-23, 88 y.o.   MRN: [id number]        Chief Complaint: follow up HTN, HLD and hyperglycemia, low b12 and Vit d       HPI:  Kristopher [name] is a 88 y.o. male here overall doing very well, Pt denies chest pain, increased sob or doe, wheezing, orthopnea, PND, increased LE swelling, palpitations, dizziness or syncope.   Pt denies polydipsia, polyuria, or new focal neuro s/s.    Pt denies fever, wt loss, night sweats, loss of appetite, or other constitutional symptoms  Pt has had low b12 and plans to start B12 oral. Taking Vit D  Due for flu shot       Wt Readings from Last 3 Encounters:  02/01/22 124 lb (56.2 kg)  09/13/21 124 lb 3.2 oz (56.3 kg)  09/01/21 122 lb 9.6 oz (55.6 kg)   BP Readings from Last 3 Encounters:  02/01/22 126/72  09/13/21 118/68  09/01/21 116/60         Past Medical History:  Diagnosis Date   AAA (abdominal aortic aneurysm) ([place])    ultrasound 07/2011:  2.7 x 2.8 cm   Acute myocardial infarction, unspecified site, episode of care unspecified 1998   Aortic stenosis    echo 4/12: normal LVF, mild LAE, no significant AS   Arthritis    "all over" (05/21/2017)   CAD (coronary artery disease)    s/p PCI of LAD 1998;  s/p DES to RCA 05/2009;  Myoview 4/13: low risk, no ischemia, EF 56%   CAP (community acquired pneumonia) 05/21/2017   COPD (chronic obstructive pulmonary disease) (HCC)    DJD (degenerative joint disease), lumbar 07/06/2011   Esophageal reflux    HLD (hyperlipidemia)    HTN (hypertension)    PAD (peripheral artery disease) (HCC)    s/p L CIA stent 09/2011 ([facility])   Pneumonia    "I've always had pneumonia; since I was 46 months old" (05/21/2017)   Renal artery stenosis (HCC)    s/p bilat RA stents   Urine incontinence    Past Surgical History:  Procedure Laterality Date   CATARACT EXTRACTION W/ INTRAOCULAR LENS  IMPLANT, BILATERAL Bilateral YRS AGO   CORONARY ANGIOPLASTY WITH STENT PLACEMENT  05/1996   PCI  of LAD; "I've got a total of 4 stents" (05/21/2017)   [place] Bilateral 09/20/2011   Procedure: LOWER EXTREMITY ANGIOGRAM;  Surgeon: Wellington Hampshire, MD;  Location: [facility];  Service: Cardiovascular;  Laterality: Bilateral;   PACEMAKER IMPLANT N/A 12/17/2019   Procedure: PACEMAKER IMPLANT;  Surgeon: Vickie Epley, MD;  Location: [facility];  Service: Cardiovascular;  Laterality: N/A;   TRANSURETHRAL RESECTION OF PROSTATE  08/11/2011   Procedure: TRANSURETHRAL RESECTION OF THE PROSTATE WITH GYRUS INSTRUMENTS;  Surgeon: Fredricka Bonine, MD;  Location: [facility];  Service: Urology;  Laterality: N/A;      VASECTOMY  1968    reports that he quit smoking about 33 years ago. His smoking use included cigarettes. He has a 114.00 pack-year smoking history. He has never used smokeless tobacco. He reports current alcohol use. He reports that he does not use drugs. family history includes Heart disease in his father and mother. Allergies  Allergen Reactions   Influenza Vaccines Other (See Comments)    unknown   Influenza Virus Vaccine Other (See Comments)    Several reaction.  Took it once woke  up two days later in the hospital.    Sulfa Antibiotics Rash   Current Outpatient Medications on File Prior to Visit  Medication Sig Dispense Refill   Ascorbic Acid (VITAMIN C) 500 MG tablet Take 500 mg by mouth 2 (two) times daily.     aspirin 81 MG tablet Take 1 tablet (81 mg total) by mouth daily.     BEE POLLEN PO Take 1 capsule by mouth 2 (two) times daily.      Cholecalciferol (VITAMIN D3) 50 MCG (2000 UT) TABS Take by mouth.     cyclobenzaprine (FLEXERIL) 5 MG tablet TAKE 1 TABLET(5 MG) BY MOUTH AT BEDTIME AS NEEDED FOR MUSCLE SPASMS (Patient taking differently: Take 5 mg by mouth at bedtime as needed for muscle spasms.) 30 tablet 2   diclofenac sodium (VOLTAREN) 1 % GEL APPLY 2 GRAMS TOPICALLY FOUR TIMES A DAY AS NEEDED. (Patient taking  differently: Apply 2 g topically 4 (four) times daily as needed (joint pain).) 700 g 4   Docusate Calcium (STOOL SOFTENER PO) Take 2 capsules by mouth daily.     Multiple Vitamin (MULTIVITAMIN) capsule Take 1 capsule by mouth daily. (Patient not taking: Reported on 09/13/2021)     Multiple Vitamins-Minerals (PRESERVISION AREDS PO) Take 2 tablets by mouth daily.     nitroGLYCERIN (NITROSTAT) 0.4 MG SL tablet Place 1 tablet (0.4 mg total) under the tongue every 5 (five) minutes as needed for chest pain. 90 tablet 3   NON FORMULARY Superfood daily     omeprazole (PRILOSEC) 40 MG capsule TAKE 1 CAPSULE IN THE MORNING AND AT BEDTIME 180 capsule 3   OVER THE COUNTER MEDICATION Take 2 capsules by mouth every morning. NITRIC OXIDE     rosuvastatin (CRESTOR) 40 MG tablet TAKE 1 TABLET DAILY 90 tablet 3   tamsulosin (FLOMAX) 0.4 MG CAPS capsule Take 0.4 mg by mouth daily.     Tiotropium Bromide Monohydrate (SPIRIVA RESPIMAT) 1.25 MCG/ACT AERS Inhale 2 puffs into the lungs daily. 4 g 0   Tiotropium Bromide Monohydrate (SPIRIVA RESPIMAT) 1.25 MCG/ACT AERS Inhale 2 puffs into the lungs daily. 4 g 5   No current facility-administered medications on file prior to visit.        ROS:  All others reviewed and negative.  Objective        PE:  BP 126/72 (BP Location: Left Arm, Patient Position: Sitting, Cuff Size: Large)   Pulse 67   Temp 97.7 F (36.5 C) (Oral)   Ht '5\' 8"'$  (1.727 m)   Wt 124 lb (56.2 kg)   SpO2 95%   BMI 18.85 kg/m                 Constitutional: Pt appears in NAD               HENT: Head: NCAT.                Right Ear: External ear normal.                 Left Ear: External ear normal.                Eyes: . Pupils are equal, round, and reactive to light. Conjunctivae and EOM are normal               Nose: without d/c or deformity               Neck: Neck supple. Gross normal ROM  Cardiovascular: Normal rate and regular rhythm.                 Pulmonary/Chest: Effort  normal and breath sounds without rales or wheezing.                Abd:  Soft, NT, ND, + BS, no organomegaly               Neurological: Pt is alert. At baseline orientation, motor grossly intact               Skin: Skin is warm. No rashes, no other new lesions, LE edema - none               Psychiatric: Pt behavior is normal without agitation   Micro: none  Cardiac tracings I have personally interpreted today:  none  Pertinent Radiological findings (summarize): none   Lab Results  Component Value Date   WBC 10.4 08/02/2021   HGB 11.9 (L) 08/02/2021   HCT 37.3 (L) 08/02/2021   PLT 249.0 08/02/2021   GLUCOSE 130 (H) 08/02/2021   CHOL 142 08/02/2021   TRIG 108.0 08/02/2021   HDL 56.80 08/02/2021   LDLCALC 63 08/02/2021   ALT 17 08/02/2021   AST 26 08/02/2021   NA 142 08/02/2021   K 4.6 08/02/2021   CL 106 08/02/2021   CREATININE 1.22 08/02/2021   BUN 29 (H) 08/02/2021   CO2 25 08/02/2021   TSH 4.56 08/02/2021   INR 1.01 08/31/2013   HGBA1C 6.9 (H) 08/02/2021   Assessment/Plan:  Skippy Marhefka Sigal is a 88 y.o. White or Caucasian [1] male with  has a past medical history of AAA (abdominal aortic aneurysm) ([place]), Acute myocardial infarction, unspecified site, episode of care unspecified (1998), Aortic stenosis, Arthritis, CAD (coronary artery disease), CAP (community acquired pneumonia) (05/21/2017), COPD (chronic obstructive pulmonary disease) ([place]), DJD (degenerative joint disease), lumbar (07/06/2011), Esophageal reflux, HLD (hyperlipidemia), HTN (hypertension), PAD (peripheral artery disease) ([place]), Pneumonia, Renal artery stenosis ([place]), and Urine incontinence.  B12 deficiency Low recent, Pt to start B12 1000 mcg per day  Vitamin D deficiency Last vitamin D Lab Results  Component Value Date   VD25OH 50.52 08/02/2021   Stable, cont oral replacement   Essential hypertension BP Readings from Last 3 Encounters:  02/01/22 126/72  09/13/21 118/68  09/01/21 116/60    Stable, pt to continue medical treatment  - diet, low salt, excercise   Diabetes ([place]) Lab Results  Component Value Date   HGBA1C 6.9 (H) 08/02/2021   Uncontrolled but adequate for age, pt to continue current medical treatment  - diet, wt control, exercise, and f/u a1c today   Hyperlipidemia Lab Results  Component Value Date   LDLCALC 63 08/02/2021   Stable, pt to continue current low chol diet, for f/u lab today  Followup: Return in about 6 months (around 08/03/2022).  [name], MD 02/01/2022 1:06 PM [facility]

## 2022-02-01 NOTE — Assessment & Plan Note (Signed)
Lab Results  Component Value Date   HGBA1C 6.9 (H) 08/02/2021   Uncontrolled but adequate for age, pt to continue current medical treatment  - diet, wt control, exercise, and f/u a1c today

## 2022-02-01 NOTE — Patient Instructions (Signed)
Please have your Shingrix (shingles) shots done at your local pharmacy.  Take B12 1000 mcg per day   You had the flu shot today  Please continue all other medications as before, and refills have been done if requested.  Please have the pharmacy call with any other refills you may need.  Please continue your efforts at being more active, low cholesterol diet, and weight control.  Please keep your appointments with your specialists as you may have planned  Please go to the LAB at the blood drawing area for the tests to be done  You will be contacted by phone if any changes need to be made immediately.  Otherwise, you will receive a letter about your results with an explanation, but please check with MyChart first.  Please remember to sign up for MyChart if you have not done so, as this will be important to you in the future with finding out test results, communicating by private email, and scheduling acute appointments online when needed.  Please make an Appointment to return in 6 months, or sooner if needed

## 2022-02-01 NOTE — Assessment & Plan Note (Signed)
BP Readings from Last 3 Encounters:  02/01/22 126/72  09/13/21 118/68  09/01/21 116/60   Stable, pt to continue medical treatment  - diet, low salt, excercise

## 2022-02-01 NOTE — Assessment & Plan Note (Addendum)
Lab Results  Component Value Date   LDLCALC 63 08/02/2021   Stable, pt to continue current low chol diet, for f/u lab today

## 2022-02-01 NOTE — Assessment & Plan Note (Addendum)
Low recent, Pt to start B12 1000 mcg per day

## 2022-02-02 ENCOUNTER — Encounter: Payer: Self-pay | Admitting: Internal Medicine

## 2022-02-02 LAB — MICROALBUMIN / CREATININE URINE RATIO
Creatinine,U: 87.9 mg/dL
Microalb Creat Ratio: 10.3 mg/g (ref 0.0–30.0)
Microalb, Ur: 9.1 mg/dL — ABNORMAL HIGH (ref 0.0–1.9)

## 2022-02-22 ENCOUNTER — Encounter: Payer: Self-pay | Admitting: Registered Nurse

## 2022-02-22 ENCOUNTER — Encounter: Payer: Medicare PPO | Attending: Registered Nurse | Admitting: Registered Nurse

## 2022-02-22 VITALS — BP 129/83 | HR 89 | Ht 68.0 in | Wt 123.0 lb

## 2022-02-22 DIAGNOSIS — M542 Cervicalgia: Secondary | ICD-10-CM | POA: Diagnosis not present

## 2022-02-22 DIAGNOSIS — M48062 Spinal stenosis, lumbar region with neurogenic claudication: Secondary | ICD-10-CM | POA: Diagnosis not present

## 2022-02-22 DIAGNOSIS — G894 Chronic pain syndrome: Secondary | ICD-10-CM | POA: Diagnosis not present

## 2022-02-22 NOTE — Progress Notes (Signed)
Subjective:    Patient ID: Kristopher [name], male    DOB: July 17, 1930, 88 y.o.   MRN: [id number]  HPI: Kristopher [name] is a 88 y.o. male who returns for follow up appointment for chronic pain. He states his pain is located in his neck and lower back pain. He rates his pain 2.His  current exercise regime is walking, light yard work  and performing stretching exercises.  Kristopher [name] lasr refill Hydrocodone  was 12/05/2018.      Pain Inventory Average Pain 3 Pain Right Now 2 My pain is constant, dull, and stabbing  In the last 24 hours, has pain interfered with the following? General activity 0 Relation with others 0 Enjoyment of life 0 What TIME of day is your pain at its worst? daytime Sleep (in general) Fair  Pain is worse with: some activites Pain improves with: rest and medication Relief from Meds: 8      Family History  Problem Relation Age of Onset   Heart disease Mother    Heart disease Father    Social History   Socioeconomic History   Marital status: Widowed    Spouse name: Not on file   Number of children: Not on file   Years of education: GED   Highest education level: Not on file  Occupational History   Occupation: retired  Tobacco Use   Smoking status: Former    Packs/day: 3.00    Years: 38.00    Total pack years: 114.00    Types: Cigarettes    Quit date: 04/10/1988    Years since quitting: 33.8   Smokeless tobacco: Never  Vaping Use   Vaping Use: Never used  Substance and Sexual Activity   Alcohol use: Yes    Comment: 05/21/2017 "might drink 6 beers/year"   Drug use: No   Sexual activity: Not on file  Other Topics Concern   Not on file  Social History Narrative   Not on file   Social Determinants of Health   Financial Resource Strain: Low Risk  (09/01/2021)   Overall Financial Resource Strain (CARDIA)    Difficulty of Paying Living Expenses: Not hard at all  Food Insecurity: No Food Insecurity (09/01/2021)   Hunger Vital Sign    Worried  About Running Out of Food in the Last Year: Never true    [place] in the Last Year: Never true  Transportation Needs: No Transportation Needs (09/01/2021)   PRAPARE - Hydrologist (Medical): No    Lack of Transportation (Non-Medical): No  Physical Activity: Sufficiently Active (09/01/2021)   Exercise Vital Sign    Days of Exercise per Week: 5 days    Minutes of Exercise per Session: 30 min  Stress: No Stress Concern Present (09/01/2021)   [place]    Feeling of Stress : Not at all  Social Connections: Moderately Integrated (09/01/2021)   Social Connection and Isolation Panel [NHANES]    Frequency of Communication with Friends and Family: More than three times a week    Frequency of Social Gatherings with Friends and Family: More than three times a week    Attends Religious Services: 1 to 4 times per year    Active Member of [employer] or Organizations: No    Attends Archivist Meetings: 1 to 4 times per year    Marital Status: Widowed   Past Surgical History:  Procedure Laterality Date  CATARACT EXTRACTION W/ INTRAOCULAR LENS  IMPLANT, BILATERAL Bilateral YRS AGO   CORONARY ANGIOPLASTY WITH STENT PLACEMENT  05/1996   PCI of LAD; "I've got a total of 4 stents" (05/21/2017)   [place] Bilateral 09/20/2011   Procedure: LOWER EXTREMITY ANGIOGRAM;  Surgeon: Wellington Hampshire, MD;  Location: [facility];  Service: Cardiovascular;  Laterality: Bilateral;   PACEMAKER IMPLANT N/A 12/17/2019   Procedure: PACEMAKER IMPLANT;  Surgeon: Vickie Epley, MD;  Location: [facility];  Service: Cardiovascular;  Laterality: N/A;   TRANSURETHRAL RESECTION OF PROSTATE  08/11/2011   Procedure: TRANSURETHRAL RESECTION OF THE PROSTATE WITH GYRUS INSTRUMENTS;  Surgeon: Fredricka Bonine, MD;  Location: [facility];  Service: Urology;  Laterality: N/A;       VASECTOMY  1968   Past Medical History:  Diagnosis Date   AAA (abdominal aortic aneurysm) ([place])    ultrasound 07/2011:  2.7 x 2.8 cm   Acute myocardial infarction, unspecified site, episode of care unspecified 1998   Aortic stenosis    echo 4/12: normal LVF, mild LAE, no significant AS   Arthritis    "all over" (05/21/2017)   CAD (coronary artery disease)    s/p PCI of LAD 1998;  s/p DES to RCA 05/2009;  Myoview 4/13: low risk, no ischemia, EF 56%   CAP (community acquired pneumonia) 05/21/2017   COPD (chronic obstructive pulmonary disease) (H[facility])    DJD (degenerative joint disease), lumbar 07/06/2011   Esophageal reflux    HLD (hyperlipidemia)    HTN (hypertension)    PAD (peripheral artery disease) ([facility])    s/p L CIA stent 09/2011 ([place])   Pneumonia    "I've always had pneumonia; since I was 1 months old" (05/21/2017)   Renal artery stenosis (HCC)    s/p bilat RA stents   Urine incontinence    BP 129/83   Pulse 89   Ht '5\' 8"'$  (1.727 m)   Wt 123 lb (55.8 kg)   BMI 18.70 kg/m   Opioid Risk Score:   Fall Risk Score:  `1  Depression screen [facility] 2/9     02/22/2022    9:56 AM 02/01/2022   10:58 AM 09/01/2021   12:00 PM 08/02/2021   11:30 AM 08/02/2021   11:14 AM 02/22/2021    9:52 AM 01/27/2021   11:41 AM  Depression screen PHQ 2/9  Decreased Interest 0 0 0 0 0 0 0  Down, Depressed, Hopeless 0 0 0 0 0 0 0  PHQ - 2 Score 0 0 0 0 0 0 0  Altered sleeping  0   1    Tired, decreased energy  0   1    Change in appetite  0   1    Feeling bad or failure about yourself   0   0    Trouble concentrating  0   0    Moving slowly or fidgety/restless  0   0    Suicidal thoughts  0   0    PHQ-9 Score  0   3    Difficult doing work/chores  Not difficult at all         Review of Systems  Musculoskeletal:  Positive for back pain.       Right knee pain  All other systems reviewed and are negative.     Objective:   Physical Exam Vitals and nursing note reviewed.  Constitutional:  Appearance: Normal appearance.  Cardiovascular:     Rate and Rhythm: Normal rate and regular rhythm.     Pulses: Normal pulses.     Heart sounds: Normal heart sounds.  Pulmonary:     Effort: Pulmonary effort is normal.     Breath sounds: Normal breath sounds.  Musculoskeletal:     Cervical back: Normal range of motion and neck supple.     Comments: Normal Muscle Bulk and Muscle Testing Reveals:  Upper Extremities: Full ROM and Muscle Strength 5/5  Lower Extremities: Full ROM and Muscle Strength 5/5 Arises from Chair slowly using cane for support Narrow Based  Gait     Skin:    General: Skin is warm and dry.  Neurological:     Mental Status: He is alert and oriented to person, place, and time.  Psychiatric:        Mood and Affect: Mood normal.        Behavior: Behavior normal.         Assessment & Plan:  1. Patellofemoral arthritis. Continue Voltaren gel, and continue with exercise regimen. 02/22/2022 2 Chronic Low Back Pain/ Lumbar Stenosis:Continue HEP as Tolerated and Continue to Monitor. Continue current medication regimen. 02/22/2022 3. Muscle Spasm: Continue current medication regimen with  Flexeril. 02/22/2022 4. Primary Osteoarthritis of Right Hip: Continue to Alternate with Ice and Heat Therapy. Continue to Monitor. 02/22/2022     F/U PRN

## 2022-03-06 ENCOUNTER — Other Ambulatory Visit: Payer: Self-pay | Admitting: Cardiology

## 2022-03-24 ENCOUNTER — Ambulatory Visit (INDEPENDENT_AMBULATORY_CARE_PROVIDER_SITE_OTHER): Payer: Medicare PPO

## 2022-03-24 DIAGNOSIS — I442 Atrioventricular block, complete: Secondary | ICD-10-CM | POA: Diagnosis not present

## 2022-03-27 LAB — CUP PACEART REMOTE DEVICE CHECK
Battery Remaining Longevity: 144 mo
Battery Remaining Percentage: 100 %
Brady Statistic RA Percent Paced: 6 %
Brady Statistic RV Percent Paced: 100 %
Date Time Interrogation Session: 20231215010000
Implantable Lead Connection Status: 753985
Implantable Lead Connection Status: 753985
Implantable Lead Implant Date: 20210908
Implantable Lead Implant Date: 20210908
Implantable Lead Location: 753859
Implantable Lead Location: 753860
Implantable Lead Model: 7841
Implantable Lead Model: 7842
Implantable Lead Serial Number: 1054557
Implantable Lead Serial Number: 1096552
Implantable Pulse Generator Implant Date: 20210908
Lead Channel Impedance Value: 702 Ohm
Lead Channel Impedance Value: 766 Ohm
Lead Channel Pacing Threshold Amplitude: 0.5 V
Lead Channel Pacing Threshold Amplitude: 0.8 V
Lead Channel Pacing Threshold Pulse Width: 0.4 ms
Lead Channel Pacing Threshold Pulse Width: 0.4 ms
Lead Channel Setting Pacing Amplitude: 1.3 V
Lead Channel Setting Pacing Amplitude: 2.5 V
Lead Channel Setting Pacing Pulse Width: 0.4 ms
Lead Channel Setting Sensing Sensitivity: 3 mV
Pulse Gen Serial Number: 948694
Zone Setting Status: 755011

## 2022-04-13 DIAGNOSIS — N401 Enlarged prostate with lower urinary tract symptoms: Secondary | ICD-10-CM | POA: Diagnosis not present

## 2022-04-13 DIAGNOSIS — N281 Cyst of kidney, acquired: Secondary | ICD-10-CM | POA: Diagnosis not present

## 2022-04-13 DIAGNOSIS — R351 Nocturia: Secondary | ICD-10-CM | POA: Diagnosis not present

## 2022-04-13 NOTE — Progress Notes (Signed)
Remote pacemaker transmission.   

## 2022-04-18 DIAGNOSIS — Z950 Presence of cardiac pacemaker: Secondary | ICD-10-CM | POA: Diagnosis not present

## 2022-04-18 DIAGNOSIS — I442 Atrioventricular block, complete: Secondary | ICD-10-CM | POA: Diagnosis not present

## 2022-04-24 DIAGNOSIS — I442 Atrioventricular block, complete: Secondary | ICD-10-CM | POA: Diagnosis not present

## 2022-04-24 DIAGNOSIS — Z950 Presence of cardiac pacemaker: Secondary | ICD-10-CM | POA: Diagnosis not present

## 2022-05-19 DIAGNOSIS — I442 Atrioventricular block, complete: Secondary | ICD-10-CM | POA: Diagnosis not present

## 2022-05-19 DIAGNOSIS — Z950 Presence of cardiac pacemaker: Secondary | ICD-10-CM | POA: Diagnosis not present

## 2022-05-25 DIAGNOSIS — Z950 Presence of cardiac pacemaker: Secondary | ICD-10-CM | POA: Diagnosis not present

## 2022-05-25 DIAGNOSIS — I442 Atrioventricular block, complete: Secondary | ICD-10-CM | POA: Diagnosis not present

## 2022-06-05 ENCOUNTER — Other Ambulatory Visit: Payer: Self-pay | Admitting: Cardiology

## 2022-06-17 DIAGNOSIS — Z950 Presence of cardiac pacemaker: Secondary | ICD-10-CM | POA: Diagnosis not present

## 2022-06-17 DIAGNOSIS — I442 Atrioventricular block, complete: Secondary | ICD-10-CM | POA: Diagnosis not present

## 2022-06-21 ENCOUNTER — Ambulatory Visit (HOSPITAL_BASED_OUTPATIENT_CLINIC_OR_DEPARTMENT_OTHER)
Admission: RE | Admit: 2022-06-21 | Discharge: 2022-06-21 | Disposition: A | Discharge status: Home / Self Care | Payer: Medicare PPO | Source: Ambulatory Visit | Attending: Emergency Medicine | Admitting: Emergency Medicine

## 2022-06-21 DIAGNOSIS — R918 Other nonspecific abnormal finding of lung field: Secondary | ICD-10-CM

## 2022-06-21 DIAGNOSIS — J432 Centrilobular emphysema: Secondary | ICD-10-CM | POA: Diagnosis not present

## 2022-06-23 ENCOUNTER — Ambulatory Visit: Payer: Medicare PPO | Attending: Internal Medicine

## 2022-06-23 DIAGNOSIS — Z950 Presence of cardiac pacemaker: Secondary | ICD-10-CM | POA: Diagnosis not present

## 2022-06-23 DIAGNOSIS — I442 Atrioventricular block, complete: Secondary | ICD-10-CM | POA: Diagnosis not present

## 2022-07-18 DIAGNOSIS — Z950 Presence of cardiac pacemaker: Secondary | ICD-10-CM | POA: Diagnosis not present

## 2022-07-18 DIAGNOSIS — I442 Atrioventricular block, complete: Secondary | ICD-10-CM | POA: Diagnosis not present

## 2022-07-24 DIAGNOSIS — I442 Atrioventricular block, complete: Secondary | ICD-10-CM | POA: Diagnosis not present

## 2022-07-24 DIAGNOSIS — Z950 Presence of cardiac pacemaker: Secondary | ICD-10-CM | POA: Diagnosis not present

## 2022-07-26 ENCOUNTER — Other Ambulatory Visit: Payer: Self-pay

## 2022-07-26 DIAGNOSIS — R918 Other nonspecific abnormal finding of lung field: Secondary | ICD-10-CM

## 2022-08-04 ENCOUNTER — Other Ambulatory Visit: Payer: Self-pay | Admitting: Cardiology

## 2022-08-17 DIAGNOSIS — Z950 Presence of cardiac pacemaker: Secondary | ICD-10-CM | POA: Diagnosis not present

## 2022-08-17 DIAGNOSIS — I442 Atrioventricular block, complete: Secondary | ICD-10-CM | POA: Diagnosis not present

## 2022-08-23 DIAGNOSIS — I442 Atrioventricular block, complete: Secondary | ICD-10-CM | POA: Diagnosis not present

## 2022-08-23 DIAGNOSIS — Z950 Presence of cardiac pacemaker: Secondary | ICD-10-CM | POA: Diagnosis not present

## 2022-08-28 ENCOUNTER — Telehealth: Payer: Self-pay

## 2022-08-28 NOTE — Telephone Encounter (Signed)
N/A unable to leave a message for patient to call back to schedule Medicare Annual Wellness Visit   Last AWV  09/01/21  Please schedule at anytime with [facility] Advisor on Annual Wellness Schedule if patient calls the office back.    30 Minute appointment

## 2022-09-12 ENCOUNTER — Other Ambulatory Visit: Payer: Self-pay | Admitting: *Deleted

## 2022-09-12 DIAGNOSIS — I7143 Infrarenal abdominal aortic aneurysm, without rupture: Secondary | ICD-10-CM

## 2022-09-12 NOTE — Progress Notes (Signed)
vas 

## 2022-09-17 DIAGNOSIS — Z950 Presence of cardiac pacemaker: Secondary | ICD-10-CM | POA: Diagnosis not present

## 2022-09-17 DIAGNOSIS — I442 Atrioventricular block, complete: Secondary | ICD-10-CM | POA: Diagnosis not present

## 2022-09-22 ENCOUNTER — Ambulatory Visit (INDEPENDENT_AMBULATORY_CARE_PROVIDER_SITE_OTHER): Payer: Medicare PPO

## 2022-09-22 DIAGNOSIS — I442 Atrioventricular block, complete: Secondary | ICD-10-CM

## 2022-09-22 LAB — CUP PACEART REMOTE DEVICE CHECK
Battery Remaining Longevity: 138 mo
Battery Remaining Percentage: 100 %
Brady Statistic RA Percent Paced: 6 %
Brady Statistic RV Percent Paced: 99 %
Date Time Interrogation Session: 20240614010200
Implantable Lead Connection Status: 753985
Implantable Lead Connection Status: 753985
Implantable Lead Implant Date: 20210908
Implantable Lead Implant Date: 20210908
Implantable Lead Location: 753859
Implantable Lead Location: 753860
Implantable Lead Model: 7841
Implantable Lead Model: 7842
Implantable Lead Serial Number: 1054557
Implantable Lead Serial Number: 1096552
Implantable Pulse Generator Implant Date: 20210908
Lead Channel Impedance Value: 741 Ohm
Lead Channel Impedance Value: 773 Ohm
Lead Channel Pacing Threshold Amplitude: 0.5 V
Lead Channel Pacing Threshold Amplitude: 0.8 V
Lead Channel Pacing Threshold Pulse Width: 0.4 ms
Lead Channel Pacing Threshold Pulse Width: 0.4 ms
Lead Channel Setting Pacing Amplitude: 1.3 V
Lead Channel Setting Pacing Amplitude: 2.5 V
Lead Channel Setting Pacing Pulse Width: 0.4 ms
Lead Channel Setting Sensing Sensitivity: 3 mV
Pulse Gen Serial Number: 948694
Zone Setting Status: 755011

## 2022-09-23 DIAGNOSIS — I442 Atrioventricular block, complete: Secondary | ICD-10-CM | POA: Diagnosis not present

## 2022-09-23 DIAGNOSIS — Z950 Presence of cardiac pacemaker: Secondary | ICD-10-CM | POA: Diagnosis not present

## 2022-10-03 ENCOUNTER — Other Ambulatory Visit: Payer: Self-pay | Admitting: Cardiology

## 2022-10-10 NOTE — Progress Notes (Signed)
Remote pacemaker transmission.   

## 2022-10-11 ENCOUNTER — Other Ambulatory Visit: Payer: Self-pay | Admitting: Internal Medicine

## 2022-10-17 ENCOUNTER — Ambulatory Visit (HOSPITAL_COMMUNITY)
Admission: RE | Admit: 2022-10-17 | Discharge: 2022-10-17 | Disposition: A | Discharge status: Home / Self Care | Payer: Medicare PPO | Source: Ambulatory Visit | Attending: Cardiology | Admitting: Cardiology

## 2022-10-17 ENCOUNTER — Encounter: Payer: Self-pay | Admitting: *Deleted

## 2022-10-17 DIAGNOSIS — I7143 Infrarenal abdominal aortic aneurysm, without rupture: Secondary | ICD-10-CM | POA: Diagnosis not present

## 2022-10-17 DIAGNOSIS — I442 Atrioventricular block, complete: Secondary | ICD-10-CM | POA: Diagnosis not present

## 2022-10-17 DIAGNOSIS — Z950 Presence of cardiac pacemaker: Secondary | ICD-10-CM | POA: Diagnosis not present

## 2022-10-23 DIAGNOSIS — Z950 Presence of cardiac pacemaker: Secondary | ICD-10-CM | POA: Diagnosis not present

## 2022-10-23 DIAGNOSIS — I442 Atrioventricular block, complete: Secondary | ICD-10-CM | POA: Diagnosis not present

## 2022-11-09 ENCOUNTER — Ambulatory Visit (INDEPENDENT_AMBULATORY_CARE_PROVIDER_SITE_OTHER): Payer: Medicare PPO

## 2022-11-09 VITALS — BP 139/77 | HR 74 | Temp 96.9°F | Ht 68.0 in | Wt 123.0 lb

## 2022-11-09 DIAGNOSIS — E7800 Pure hypercholesterolemia, unspecified: Secondary | ICD-10-CM

## 2022-11-09 DIAGNOSIS — Z0000 Encounter for general adult medical examination without abnormal findings: Secondary | ICD-10-CM | POA: Diagnosis not present

## 2022-11-09 MED ORDER — ROSUVASTATIN CALCIUM 40 MG PO TABS: 40.0000 mg | ORAL_TABLET | Freq: Every day | ORAL | 0 refills | Status: DC

## 2022-11-09 NOTE — Patient Instructions (Signed)
Kristopher [name] , Thank you for taking time to come for your Medicare Wellness Visit. I appreciate your ongoing commitment to your health goals. Please review the following plan we discussed and let me know if I can assist you in the future.   Referrals/Orders/Follow-Ups/Clinician Recommendations: Keep up the good work.  Each day, aim for 6 glasses of water, plenty of protein in your diet and try to get up and walk/ stretch every hour for 5-10 minutes at a time.    This is a list of the screening recommended for you and due dates:  Health Maintenance  Topic Date Due   Complete foot exam   Never done   Eye exam for diabetics  Never done   Zoster (Shingles) Vaccine (1 of 2) 09/24/1949   COVID-19 Vaccine (4 - 2023-24 season) 12/09/2021   Hemoglobin A1C  08/03/2022   Medicare Annual Wellness Visit  11/09/2023   DTaP/Tdap/Td vaccine (2 - Td or Tdap) 12/01/2027   Pneumonia Vaccine  Completed   HPV Vaccine  Aged Out    Advanced directives: (Copy Requested) Please bring a copy of your health care power of attorney and living will to the office to be added to your chart at your convenience.  Next Medicare Annual Wellness Visit scheduled for next year: Yes  Preventive Care 16 Years and Older, Male  Preventive care refers to lifestyle choices and visits with your health care provider that can promote health and wellness. What does preventive care include? A yearly physical exam. This is also called an annual well check. Dental exams once or twice a year. Routine eye exams. Ask your health care provider how often you should have your eyes checked. Personal lifestyle choices, including: Daily care of your teeth and gums. Regular physical activity. Eating a healthy diet. Avoiding tobacco and drug use. Limiting alcohol use. Practicing safe sex. Taking low doses of aspirin every day. Taking vitamin and mineral supplements as recommended by your health care provider. What happens during an annual  well check? The services and screenings done by your health care provider during your annual well check will depend on your age, overall health, lifestyle risk factors, and family history of disease. Counseling  Your health care provider may ask you questions about your: Alcohol use. Tobacco use. Drug use. Emotional well-being. Home and relationship well-being. Sexual activity. Eating habits. History of falls. Memory and ability to understand (cognition). Work and work Astronomer. Screening  You may have the following tests or measurements: Height, weight, and BMI. Blood pressure. Lipid and cholesterol levels. These may be checked every 5 years, or more frequently if you are over 35 years old. Skin check. Lung cancer screening. You may have this screening every year starting at age 11 if you have a 30-pack-year history of smoking and currently smoke or have quit within the past 15 years. Fecal occult blood test (FOBT) of the stool. You may have this test every year starting at age 39. Flexible sigmoidoscopy or colonoscopy. You may have a sigmoidoscopy every 5 years or a colonoscopy every 10 years starting at age 88. Prostate cancer screening. Recommendations will vary depending on your family history and other risks. Hepatitis C blood test. Hepatitis B blood test. Sexually transmitted disease (STD) testing. Diabetes screening. This is done by checking your blood sugar (glucose) after you have not eaten for a while (fasting). You may have this done every 1-3 years. Abdominal aortic aneurysm (AAA) screening. You may need this if you are a current  or former smoker. Osteoporosis. You may be screened starting at age 8 if you are at high risk. Talk with your health care provider about your test results, treatment options, and if necessary, the need for more tests. Vaccines  Your health care provider may recommend certain vaccines, such as: Influenza vaccine. This is recommended every  year. Tetanus, diphtheria, and acellular pertussis (Tdap, Td) vaccine. You may need a Td booster every 10 years. Zoster vaccine. You may need this after age 20. Pneumococcal 13-valent conjugate (PCV13) vaccine. One dose is recommended after age 29. Pneumococcal polysaccharide (PPSV23) vaccine. One dose is recommended after age 72. Talk to your health care provider about which screenings and vaccines you need and how often you need them. This information is not intended to replace advice given to you by your health care provider. Make sure you discuss any questions you have with your health care provider. Document Released: 04/23/2015 Document Revised: 12/15/2015 Document Reviewed: 01/26/2015 Elsevier Interactive Patient Education  2017 [employer].  Fall Prevention in the Home Falls can cause injuries. They can happen to people of all ages. There are many things you can do to make your home safe and to help prevent falls. What can I do on the outside of my home? Regularly fix the edges of walkways and driveways and fix any cracks. Remove anything that might make you trip as you walk through a door, such as a raised step or threshold. Trim any bushes or trees on the path to your home. Use bright outdoor lighting. Clear any walking paths of anything that might make someone trip, such as rocks or tools. Regularly check to see if handrails are loose or broken. Make sure that both sides of any steps have handrails. Any raised decks and porches should have guardrails on the edges. Have any leaves, snow, or ice cleared regularly. Use sand or salt on walking paths during winter. Clean up any spills in your garage right away. This includes oil or grease spills. What can I do in the bathroom? Use night lights. Install grab bars by the toilet and in the tub and shower. Do not use towel bars as grab bars. Use non-skid mats or decals in the tub or shower. If you need to sit down in the shower, use a  plastic, non-slip stool. Keep the floor dry. Clean up any water that spills on the floor as soon as it happens. Remove soap buildup in the tub or shower regularly. Attach bath mats securely with double-sided non-slip rug tape. Do not have throw rugs and other things on the floor that can make you trip. What can I do in the bedroom? Use night lights. Make sure that you have a light by your bed that is easy to reach. Do not use any sheets or blankets that are too big for your bed. They should not hang down onto the floor. Have a firm chair that has side arms. You can use this for support while you get dressed. Do not have throw rugs and other things on the floor that can make you trip. What can I do in the kitchen? Clean up any spills right away. Avoid walking on wet floors. Keep items that you use a lot in easy-to-reach places. If you need to reach something above you, use a strong step stool that has a grab bar. Keep electrical cords out of the way. Do not use floor polish or wax that makes floors slippery. If you must use wax,  use non-skid floor wax. Do not have throw rugs and other things on the floor that can make you trip. What can I do with my stairs? Do not leave any items on the stairs. Make sure that there are handrails on both sides of the stairs and use them. Fix handrails that are broken or loose. Make sure that handrails are as long as the stairways. Check any carpeting to make sure that it is firmly attached to the stairs. Fix any carpet that is loose or worn. Avoid having throw rugs at the top or bottom of the stairs. If you do have throw rugs, attach them to the floor with carpet tape. Make sure that you have a light switch at the top of the stairs and the bottom of the stairs. If you do not have them, ask someone to add them for you. What else can I do to help prevent falls? Wear shoes that: Do not have high heels. Have rubber bottoms. Are comfortable and fit you  well. Are closed at the toe. Do not wear sandals. If you use a stepladder: Make sure that it is fully opened. Do not climb a closed stepladder. Make sure that both sides of the stepladder are locked into place. Ask someone to hold it for you, if possible. Clearly mark and make sure that you can see: Any grab bars or handrails. First and last steps. Where the edge of each step is. Use tools that help you move around (mobility aids) if they are needed. These include: Canes. Walkers. Scooters. Crutches. Turn on the lights when you go into a dark area. Replace any light bulbs as soon as they burn out. Set up your furniture so you have a clear path. Avoid moving your furniture around. If any of your floors are uneven, fix them. If there are any pets around you, be aware of where they are. Review your medicines with your doctor. Some medicines can make you feel dizzy. This can increase your chance of falling. Ask your doctor what other things that you can do to help prevent falls. This information is not intended to replace advice given to you by your health care provider. Make sure you discuss any questions you have with your health care provider. Document Released: 01/21/2009 Document Revised: 09/02/2015 Document Reviewed: 05/01/2014 Elsevier Interactive Patient Education  2017 [employer].

## 2022-11-09 NOTE — Progress Notes (Signed)
Subjective:   Kristopher [name] is a 89 y.o. male who presents for Medicare Annual/Subsequent preventive examination.  Visit Complete: Virtual  I connected with  Kristopher [name] on 11/09/22 by a audio enabled telemedicine application and verified that I am speaking with the correct person using two identifiers.  Patient Location: Home  Provider Location: Home Office  I discussed the limitations of evaluation and management by telemedicine. The patient expressed understanding and agreed to proceed.  Vital Signs: Per patient no change in vitals since last visit.   Review of Systems    Cardiac Risk Factors include: advanced age (>84men, >40 women);hypertension;dyslipidemia;male gender;Other (see comment), Risk factor comments: COPD, CKD, Atherosclerosis     Objective:    Today's Vitals   11/09/22 1345  Weight: 123 lb (55.8 kg)  Height: 5\' 8"  (1.727 m)   Body mass index is 18.7 kg/m.     11/09/2022    1:55 PM 09/01/2021   12:07 PM 08/31/2020    1:39 PM 12/17/2019   10:00 PM 12/17/2019   10:20 AM 05/21/2017    8:39 PM 05/21/2017   12:36 PM  Advanced Directives  Does Patient Have a Medical Advance Directive? Yes Yes Yes No No No No  Type of Estate agent of [place];Living will Living will;Healthcare Power of Attorney Living will      Does patient want to make changes to medical advance directive?  No - Patient declined No - Patient declined      Copy of Healthcare Power of Attorney in Chart? No - copy requested No - copy requested       Would patient like information on creating a medical advance directive?    No - Patient declined No - Patient declined No - Patient declined     Current Medications (verified) Outpatient Encounter Medications as of 11/09/2022  Medication Sig   Ascorbic Acid (VITAMIN C) 500 MG tablet Take 500 mg by mouth 2 (two) times daily.   aspirin 81 MG tablet Take 1 tablet (81 mg total) by mouth daily.   BEE POLLEN PO Take 1 capsule by mouth  2 (two) times daily.    Cholecalciferol (VITAMIN D3) 50 MCG (2000 UT) TABS Take by mouth.   cyanocobalamin (VITAMIN B12) 1000 MCG tablet Take 1 tablet (1,000 mcg total) by mouth daily.   cyclobenzaprine (FLEXERIL) 5 MG tablet TAKE 1 TABLET(5 MG) BY MOUTH AT BEDTIME AS NEEDED FOR MUSCLE SPASMS (Patient taking differently: Take 5 mg by mouth at bedtime as needed for muscle spasms.)   diclofenac sodium (VOLTAREN) 1 % GEL APPLY 2 GRAMS TOPICALLY FOUR TIMES A DAY AS NEEDED. (Patient taking differently: Apply 2 g topically 4 (four) times daily as needed (joint pain).)   Docusate Calcium (STOOL SOFTENER PO) Take 2 capsules by mouth daily.   Multiple Vitamin (MULTIVITAMIN) capsule Take 1 capsule by mouth daily.   Multiple Vitamins-Minerals (PRESERVISION AREDS PO) Take 2 tablets by mouth daily.   nitroGLYCERIN (NITROSTAT) 0.4 MG SL tablet Place 1 tablet (0.4 mg total) under the tongue every 5 (five) minutes as needed for chest pain.   omeprazole (PRILOSEC) 40 MG capsule TAKE 1 CAPSULE IN THE MORNING AND AT BEDTIME   rosuvastatin (CRESTOR) 40 MG tablet TAKE 1 TABLET DAILY (MUST KEEP SCHEDULED APPOINTMENT FOR FUTURE REFILLS)   tamsulosin (FLOMAX) 0.4 MG CAPS capsule Take 0.4 mg by mouth daily.   Tiotropium Bromide Monohydrate (SPIRIVA RESPIMAT) 1.25 MCG/ACT AERS Inhale 2 puffs into the lungs daily.   NON  FORMULARY Superfood daily (Patient not taking: Reported on 11/09/2022)   OVER THE COUNTER MEDICATION Take 2 capsules by mouth every morning. NITRIC OXIDE   No facility-administered encounter medications on file as of 11/09/2022.    Allergies (verified) Influenza vaccines, Influenza virus vaccine, and Sulfa antibiotics   History: Past Medical History:  Diagnosis Date   AAA (abdominal aortic aneurysm) (HCC)    ultrasound 07/2011:  2.7 x 2.8 cm   Acute myocardial infarction, unspecified site, episode of care unspecified 1998   Aortic stenosis    echo 4/12: normal LVF, mild LAE, no significant AS    Arthritis    "all over" (05/21/2017)   CAD (coronary artery disease)    s/p PCI of LAD 1998;  s/p DES to RCA 05/2009;  Myoview 4/13: low risk, no ischemia, EF 56%   CAP (community acquired pneumonia) 05/21/2017   COPD (chronic obstructive pulmonary disease) (HCC)    DJD (degenerative joint disease), lumbar 07/06/2011   Esophageal reflux    HLD (hyperlipidemia)    HTN (hypertension)    PAD (peripheral artery disease) (HCC)    s/p L CIA stent 09/2011 ([place])   Pneumonia    "I've always had pneumonia; since I was 57 months old" (05/21/2017)   Renal artery stenosis (HCC)    s/p bilat RA stents   Urine incontinence    Past Surgical History:  Procedure Laterality Date   CATARACT EXTRACTION W/ INTRAOCULAR LENS  IMPLANT, BILATERAL Bilateral YRS AGO   CORONARY ANGIOPLASTY WITH STENT PLACEMENT  05/1996   PCI of LAD; "I've got a total of 4 stents" (05/21/2017)   HEMORRHOID SURGERY  1954   LOWER EXTREMITY ANGIOGRAM Bilateral 09/20/2011   Procedure: LOWER EXTREMITY ANGIOGRAM;  Surgeon: Iran Ouch, MD;  Location: [facility] CATH LAB;  Service: Cardiovascular;  Laterality: Bilateral;   PACEMAKER IMPLANT N/A 12/17/2019   Procedure: PACEMAKER IMPLANT;  Surgeon: Lanier Prude, MD;  Location: [facility] INVASIVE CV LAB;  Service: Cardiovascular;  Laterality: N/A;   TRANSURETHRAL RESECTION OF PROSTATE  08/11/2011   Procedure: TRANSURETHRAL RESECTION OF THE PROSTATE WITH GYRUS INSTRUMENTS;  Surgeon: Antony Haste, MD;  Location: [facility] ORS;  Service: Urology;  Laterality: N/A;      VASECTOMY  1968   Family History  Problem Relation Age of Onset   Heart disease Mother    Heart disease Father    Social History   Socioeconomic History   Marital status: Widowed    Spouse name: Not on file   Number of children: Not on file   Years of education: GED   Highest education level: Not on file  Occupational History   Occupation: retired  Tobacco Use   Smoking status: Former    Current packs/day: 0.00     Average packs/day: 3.0 packs/day for 38.0 years (114.0 ttl pk-yrs)    Types: Cigarettes    Start date: 04/10/1950    Quit date: 04/10/1988    Years since quitting: 34.6   Smokeless tobacco: Never  Vaping Use   Vaping status: Never Used  Substance and Sexual Activity   Alcohol use: Yes    Comment: only on holidays   Drug use: No   Sexual activity: Not on file  Other Topics Concern   Not on file  Social History Narrative   Not on file   Social Determinants of Health   Financial Resource Strain: Low Risk  (11/09/2022)   Overall Financial Resource Strain (CARDIA)    Difficulty of Paying Living Expenses: Not hard  at all  Food Insecurity: No Food Insecurity (11/09/2022)   Hunger Vital Sign    Worried About Running Out of Food in the Last Year: Never true    Ran Out of Food in the Last Year: Never true  Transportation Needs: No Transportation Needs (11/09/2022)   PRAPARE - Administrator, Civil Service (Medical): No    Lack of Transportation (Non-Medical): No  Physical Activity: Inactive (11/09/2022)   Exercise Vital Sign    Days of Exercise per Week: 0 days    Minutes of Exercise per Session: 0 min  Stress: No Stress Concern Present (09/01/2021)   [employer] of Occupational Health - Occupational Stress Questionnaire    Feeling of Stress : Not at all  Social Connections: Moderately Isolated (11/09/2022)   Social Connection and Isolation Panel [NHANES]    Frequency of Communication with Friends and Family: More than three times a week    Frequency of Social Gatherings with Friends and Family: Once a week    Attends Religious Services: Never    Database administrator or Organizations: Yes    Attends Banker Meetings: Never    Marital Status: Widowed    Tobacco Counseling Counseling given: Not Answered   Clinical Intake:  Pre-visit preparation completed: Yes  Pain : No/denies pain     BMI - recorded: 18.7 Nutritional Risks: None Diabetes: No  How  often do you need to have someone help you when you read instructions, pamphlets, or other written materials from your doctor or pharmacy?: 1 - Never  Interpreter Needed?: No  Information entered by :: Kristopher [name], Kristopher [name]   Activities of Daily Living    11/09/2022    1:48 PM  In your present state of health, do you have any difficulty performing the following activities:  Hearing? 0  Vision? 0  Difficulty concentrating or making decisions? 0  Walking or climbing stairs? 1  Dressing or bathing? 0  Doing errands, shopping? 0  Preparing Food and eating ? N  Using the Toilet? N  Managing your Medications? N  Managing your Finances? N  Housekeeping or managing your Housekeeping? N    Patient Care Team: Corwin Levins, MD as PCP - General (Internal Medicine) Jens Som Madolyn Frieze, MD as PCP - Cardiology (Cardiology) Lanier Prude, MD as PCP - Electrophysiology (Cardiology) Jens Som Madolyn Frieze, MD as Consulting Physician (Cardiology) Nelson Chimes, MD as Consulting Physician (Ophthalmology) Wynn Banker Victorino Sparrow, MD as Consulting Physician (Pain Medicine)  Indicate any recent Medical Services you may have received from other than [facility] providers in the past year (date may be approximate).     Assessment:   This is a routine wellness examination for Kristopher [name].  Hearing/Vision screen Hearing Screening - Comments:: Denies hearing difficulties   Vision Screening - Comments:: Wears eyeglasses  Dietary issues and exercise activities discussed:     Goals Addressed   None   Depression Screen    11/09/2022    2:04 PM 02/22/2022    9:56 AM 02/01/2022   10:58 AM 09/01/2021   12:00 PM 08/02/2021   11:30 AM 08/02/2021   11:14 AM 02/22/2021    9:52 AM  PHQ 2/9 Scores  PHQ - 2 Score 1 0 0 0 0 0 0  PHQ- 9 Score 1  0   3     Fall Risk    11/09/2022    1:56 PM 02/22/2022    9:56 AM 02/01/2022   10:58 AM 09/01/2021  12:08 PM 08/02/2021   11:30 AM  Fall Risk   Falls in the past year? 0 0 0  0 0  Number falls in past yr: 0 0  0 0  Injury with Fall? 0 0  0 0  Risk for fall due to : No Fall Risks  No Fall Risks No Fall Risks   Follow up Falls prevention discussed  Falls evaluation completed Falls evaluation completed     MEDICARE RISK AT HOME:  Medicare Risk at Home - 11/09/22 1356     Any stairs in or around the home? Yes    If so, are there any without handrails? Yes    Home free of loose throw rugs in walkways, pet beds, electrical cords, etc? Yes    Adequate lighting in your home to reduce risk of falls? Yes    Life alert? No    Use of a cane, walker or w/c? No   cane   Grab bars in the bathroom? No    Shower chair or bench in shower? No    Elevated toilet seat or a handicapped toilet? Yes             TIMED UP AND GO:  Was the test performed?  No    Cognitive Function:    05/05/2016    8:26 AM  MMSE - Mini Mental State Exam  Orientation to time 5  Orientation to Place 5  Registration 3  Attention/ Calculation 5  Recall 2  Language- name 2 objects 2  Language- repeat 1  Language- follow 3 step command 3  Language- read & follow direction 1  Write a sentence 1  Copy design 1  Total score 29        11/09/2022    1:57 PM 09/01/2021   12:11 PM  6CIT Screen  What Year? 0 points 0 points  What month? 0 points 0 points  What time? 0 points 0 points  Count back from 20 0 points 0 points  Months in reverse 0 points 0 points  Repeat phrase 2 points 0 points  Total Score 2 points 0 points    Immunizations Immunization History  Administered Date(s) Administered   Fluad Quad(high Dose 65+) 02/01/2022   PFIZER(Purple Top)SARS-COV-2 Vaccination 08/06/2019, 08/26/2019, 07/07/2020   Pneumococcal Conjugate-13 01/15/2014   Pneumococcal Polysaccharide-23 01/26/2016   Tdap 11/30/2017   Zoster, Live 09/16/2012    TDAP status: Up to date  Flu Vaccine status: Up to date  Pneumococcal vaccine status: Up to date  Covid-19 vaccine status: Completed  vaccines  Qualifies for Shingles Vaccine? Yes   Zostavax completed Yes   Shingrix Completed?: No.    Education has been provided regarding the importance of this vaccine. Patient has been advised to call insurance company to determine out of pocket expense if they have not yet received this vaccine. Advised may also receive vaccine at local pharmacy or Health Dept. Verbalized acceptance and understanding.  Screening Tests Health Maintenance  Topic Date Due   FOOT EXAM  Never done   OPHTHALMOLOGY EXAM  Never done   Zoster Vaccines- Shingrix (1 of 2) 09/24/1949   COVID-19 Vaccine (4 - 2023-24 season) 12/09/2021   HEMOGLOBIN A1C  08/03/2022   Medicare Annual Wellness (AWV)  11/09/2023   DTaP/Tdap/Td (2 - Td or Tdap) 12/01/2027   Pneumonia Vaccine 48+ Years old  Completed   HPV VACCINES  Aged Out    Health Maintenance  Health Maintenance Due  Topic Date Due  FOOT EXAM  Never done   OPHTHALMOLOGY EXAM  Never done   Zoster Vaccines- Shingrix (1 of 2) 09/24/1949   COVID-19 Vaccine (4 - 2023-24 season) 12/09/2021   HEMOGLOBIN A1C  08/03/2022    Colorectal cancer screening: No longer required.   Lung Cancer Screening: (Low Dose CT Chest recommended if Age 81-80 years, 20 pack-year currently smoking OR have quit w/in 15years.) does qualify.   Lung Cancer Screening Referral: 06/23/2022  Additional Screening:  Hepatitis C Screening: does not qualify;   Vision Screening: Recommended annual ophthalmology exams for early detection of glaucoma and other disorders of the eye. Is the patient up to date with their annual eye exam?  No  Who is the provider or what is the name of the office in which the patient attends annual eye exams? Dr. Hazle Quant If pt is not established with a provider, would they like to be referred to a provider to establish care? No .   Dental Screening: Recommended annual dental exams for proper oral hygiene   Community Resource Referral / Chronic Care  Management: CRR required this visit?  No   CCM required this visit?  No     Plan:     I have personally reviewed and noted the following in the patient's chart:   Medical and social history Use of alcohol, tobacco or illicit drugs  Current medications and supplements including opioid prescriptions. Patient is not currently taking opioid prescriptions. Functional ability and status Nutritional status Physical activity Advanced directives List of other physicians Hospitalizations, surgeries, and ER visits in previous 12 months Vitals Screenings to include cognitive, depression, and falls Referrals and appointments  In addition, I have reviewed and discussed with patient certain preventive protocols, quality metrics, and best practice recommendations. A written personalized care plan for preventive services as well as general preventive health recommendations were provided to patient.     Phyliss Hulick L Zaylee Cornia, CMA   11/09/2022   After Visit Summary: (Mail) Due to this being a telephonic visit, the after visit summary with patients personalized plan was offered to patient via mail   Nurse Notes: Patient is requesting a refill today on Rosuvastatin 40 mg.  He has a eye examination coming up soon.

## 2022-11-16 ENCOUNTER — Encounter: Payer: Self-pay | Admitting: Emergency Medicine

## 2022-11-17 DIAGNOSIS — Z950 Presence of cardiac pacemaker: Secondary | ICD-10-CM | POA: Diagnosis not present

## 2022-11-17 DIAGNOSIS — I442 Atrioventricular block, complete: Secondary | ICD-10-CM | POA: Diagnosis not present

## 2022-11-23 DIAGNOSIS — I442 Atrioventricular block, complete: Secondary | ICD-10-CM | POA: Diagnosis not present

## 2022-11-23 DIAGNOSIS — Z950 Presence of cardiac pacemaker: Secondary | ICD-10-CM | POA: Diagnosis not present

## 2022-12-14 ENCOUNTER — Ambulatory Visit
Admission: RE | Admit: 2022-12-14 | Discharge: 2022-12-14 | Disposition: A | Discharge status: Home / Self Care | Payer: Medicare PPO | Source: Ambulatory Visit | Attending: Emergency Medicine | Admitting: Emergency Medicine

## 2022-12-14 DIAGNOSIS — I700 Atherosclerosis of aorta: Secondary | ICD-10-CM | POA: Diagnosis not present

## 2022-12-14 DIAGNOSIS — K449 Diaphragmatic hernia without obstruction or gangrene: Secondary | ICD-10-CM | POA: Diagnosis not present

## 2022-12-14 DIAGNOSIS — R918 Other nonspecific abnormal finding of lung field: Secondary | ICD-10-CM

## 2022-12-14 DIAGNOSIS — J439 Emphysema, unspecified: Secondary | ICD-10-CM | POA: Diagnosis not present

## 2022-12-14 DIAGNOSIS — R911 Solitary pulmonary nodule: Secondary | ICD-10-CM | POA: Diagnosis not present

## 2023-01-01 ENCOUNTER — Other Ambulatory Visit: Payer: Self-pay | Admitting: Emergency Medicine

## 2023-01-24 ENCOUNTER — Ambulatory Visit (INDEPENDENT_AMBULATORY_CARE_PROVIDER_SITE_OTHER): Payer: Medicare PPO | Admitting: Emergency Medicine

## 2023-01-24 ENCOUNTER — Encounter: Payer: Self-pay | Admitting: Emergency Medicine

## 2023-01-24 VITALS — BP 134/68 | HR 98 | Temp 97.6°F | Ht 68.0 in | Wt 127.0 lb

## 2023-01-24 DIAGNOSIS — Z23 Encounter for immunization: Secondary | ICD-10-CM

## 2023-01-24 DIAGNOSIS — J9611 Chronic respiratory failure with hypoxia: Secondary | ICD-10-CM | POA: Diagnosis not present

## 2023-01-24 DIAGNOSIS — K219 Gastro-esophageal reflux disease without esophagitis: Secondary | ICD-10-CM

## 2023-01-24 DIAGNOSIS — R918 Other nonspecific abnormal finding of lung field: Secondary | ICD-10-CM | POA: Diagnosis not present

## 2023-01-24 DIAGNOSIS — J439 Emphysema, unspecified: Secondary | ICD-10-CM

## 2023-01-24 MED ORDER — OMEPRAZOLE 40 MG PO CPDR: 40.0000 mg | DELAYED_RELEASE_CAPSULE | Freq: Two times a day (BID) | ORAL | 3 refills | Status: DC

## 2023-01-24 MED ORDER — AZITHROMYCIN 250 MG PO TABS: 250.0000 mg | ORAL_TABLET | Freq: Every day | ORAL | 3 refills | Status: DC

## 2023-01-24 NOTE — Assessment & Plan Note (Signed)
Oxygen Therapy Difficulty with portable oxygen due to weight of tanks. Low oxygen levels noted in the past. -Consider re-evaluation for portable oxygen concentrator.

## 2023-01-24 NOTE — Assessment & Plan Note (Addendum)
Chronic Obstructive Pulmonary Disease (COPD) Severe emphysema with daily productive cough of light green sputum. Difficulty clearing mucus, especially in the morning. Shortness of breath with exertion. No recent exacerbations requiring ER visit or antibiotics. Currently on Spiriva. -Continue Spiriva 2 puffs once daily. -Add Guaifenesin to help thin mucus. -Consider trial of Azithromycin to reduce mucus production. -Flu shot today

## 2023-01-24 NOTE — Progress Notes (Signed)
Subjective:    Patient ID: Kristopher [name], male    DOB: 1930/04/16, 89 y.o.   MRN: [id number]  HPI  ROV 01/24/23 --  Kristopher [name], a 89 year old with a history of severe emphysema and heavy tobacco use, presents for follow-up of his COPD and small pulmonary nodular disease, first identified in May 2023. Repeat imaging in August 2023 revealed a new solitary right middle lobe pulmonary nodule measuring 8mm. A subsequent scan in March 2024 showed severe emphysema, interval decrease in cluster nodularity in the posterior right upper lobe, resolution of some nodules, and a new solid pulmonary nodule in the left upper lobe measuring 6mm.  The patient reports daily production of light green mucus, which is difficult to expectorate, often not clearing until the afternoon. This mucus production does not contain any blood. Despite this, the patient denies any significant fluctuations in his breathing. He does, however, experience shortness of breath with exertion, such as walking up a slight incline to his mailbox, after which he needs to rest.  The patient has home oxygen, which he has not used for over a month due to the cumbersome nature of the equipment. He is currently on Spiriva, two puffs once daily, and does not require any rescue medication. He also takes omeprazole 40mg  twice daily for reflux, which he has been on for over 20 years. He is due for a refill of this medication.  Despite these symptoms, the patient has not had any flare-ups requiring emergency care or additional medication. He has not seen any changes in his condition that would warrant a change in his inhaler medication.  CT chest 12/14/2022 reviewed by me shows continued waxing and waning characteristics of his pulmonary nodularity, question atypical infectious process some nodules are stable, other resolved.  There is a new 6 mm nodule lateral segment of the right middle lobe with scattered calcified granulomas.  Minimal debris in the  airway.   Review of Systems As per HPi      Objective:   Physical Exam Vitals:   01/24/23 0827  BP: 134/68  Pulse: 98  Temp: 97.6 F (36.4 C)  TempSrc: Temporal  SpO2: 94%  Weight: 127 lb (57.6 kg)  Height: 5\' 8"  (1.727 m)   Gen: Pleasant, very thin elderly man, in no distress,  normal affect  ENT: No lesions,  mouth clear,  oropharynx clear, no postnasal drip  Neck: No JVD, no stridor  Lungs: No use of accessory muscles, very distant.  No wheezing or crackles  Cardiovascular: RRR, heart sounds normal, no murmur or gallops, no peripheral edema  Musculoskeletal: No deformities, no cyanosis or clubbing  Neuro: alert, awake, non focal  Skin: Warm, no lesions or rash      Assessment & Plan:  COPD (chronic obstructive pulmonary disease) (HCC) Chronic Obstructive Pulmonary Disease (COPD) Severe emphysema with daily productive cough of light green sputum. Difficulty clearing mucus, especially in the morning. Shortness of breath with exertion. No recent exacerbations requiring ER visit or antibiotics. Currently on Spiriva. -Continue Spiriva 2 puffs once daily. -Add Guaifenesin to help thin mucus. -Consider trial of Azithromycin to reduce mucus production. -Flu shot today   Chronic hypoxemic respiratory failure (HCC) Oxygen Therapy Difficulty with portable oxygen due to weight of tanks. Low oxygen levels noted in the past. -Consider re-evaluation for portable oxygen concentrator.  Pulmonary nodules Most consistent with an indolent inflammatory process  Pulmonary Nodules Waxing and waning nodularity on serial CT scans, with a new 6mm nodule in the  lateral segment of the right middle lobe. No concerning features for malignancy. -Repeat CT scan in one year.  GERD Gastroesophageal Reflux Disease (GERD) Long-term use of Omeprazole 40mg  twice daily, with no current symptoms of heartburn or reflux. -Refill Omeprazole 40mg  twice daily.    Levy Pupa, MD,  PhD 01/24/2023, 8:56 AM [facility] [phone] or if no answer before 7:00PM call [phone] For any issues after 7:00PM please call eLink [phone]

## 2023-01-24 NOTE — Assessment & Plan Note (Signed)
Gastroesophageal Reflux Disease (GERD) Long-term use of Omeprazole 40mg  twice daily, with no current symptoms of heartburn or reflux. -Refill Omeprazole 40mg  twice daily.

## 2023-01-24 NOTE — Assessment & Plan Note (Signed)
Most consistent with an indolent inflammatory process  Pulmonary Nodules Waxing and waning nodularity on serial CT scans, with a new 6mm nodule in the lateral segment of the right middle lobe. No concerning features for malignancy. -Repeat CT scan in one year.

## 2023-01-24 NOTE — Patient Instructions (Signed)
-  CHRONIC OBSTRUCTIVE PULMONARY DISEASE (COPD): COPD is a lung disease that makes it hard to breathe and is often caused by long-term exposure to lung irritants, like tobacco smoke. We will continue your current medication, Spiriva, and add Guaifenesin 600 mg once daily to help thin your mucus. We will also consider a trial of Azithromycin to reduce mucus production.  -PULMONARY NODULES: Pulmonary nodules are small round or oval-shaped growths in the lung. They are often noncancerous, but we will continue to monitor them. We will repeat a CT scan in one year to check on these nodules.  -GASTROESOPHAGEAL REFLUX DISEASE (GERD): GERD is a chronic condition where stomach acid frequently flows back into the tube connecting your mouth and stomach (esophagus). We will refill your Omeprazole prescription, which helps reduce the production of stomach acid.  -OXYGEN THERAPY: Oxygen therapy is a treatment that provides you with extra oxygen, a gas that your body needs to function. We understand that you have difficulty with your portable oxygen due to the weight of the tanks. We will consider re-evaluating for a portable oxygen concentrator.  INSTRUCTIONS:  Please continue to take your medications as prescribed. Start taking Guaifenesin as directed to help with your mucus production. We will consider starting you on Azithromycin to further reduce mucus production. We will also arrange for a repeat CT scan in one year to monitor your pulmonary nodules. Lastly, we will consider re-evaluating your need for a portable oxygen concentrator. Please contact the office if you have any questions or concerns.

## 2023-02-14 DIAGNOSIS — H04123 Dry eye syndrome of bilateral lacrimal glands: Secondary | ICD-10-CM | POA: Diagnosis not present

## 2023-02-14 DIAGNOSIS — H353131 Nonexudative age-related macular degeneration, bilateral, early dry stage: Secondary | ICD-10-CM | POA: Diagnosis not present

## 2023-03-09 ENCOUNTER — Other Ambulatory Visit: Payer: Self-pay | Admitting: Internal Medicine

## 2023-03-09 DIAGNOSIS — E7800 Pure hypercholesterolemia, unspecified: Secondary | ICD-10-CM

## 2023-03-12 ENCOUNTER — Other Ambulatory Visit: Payer: Self-pay

## 2023-03-20 ENCOUNTER — Ambulatory Visit: Payer: Medicare PPO | Attending: Pulmonary Disease | Admitting: Pulmonary Disease

## 2023-03-20 ENCOUNTER — Encounter: Payer: Self-pay | Admitting: Pulmonary Disease

## 2023-03-20 VITALS — BP 138/60 | HR 94 | Ht 69.0 in | Wt 127.2 lb

## 2023-03-20 DIAGNOSIS — I442 Atrioventricular block, complete: Secondary | ICD-10-CM | POA: Diagnosis not present

## 2023-03-20 DIAGNOSIS — Z950 Presence of cardiac pacemaker: Secondary | ICD-10-CM | POA: Diagnosis not present

## 2023-03-20 NOTE — Progress Notes (Signed)
  Electrophysiology Office Note:   Date:  03/20/2023  ID:  Kristopher [name], DOB [birth year], MRN [id number]  Primary Cardiologist: Olga Millers, MD Electrophysiologist: Lanier Prude, MD       History of Present Illness:   Kristopher [name] is a 89 y.o. male with h/o 2nd degree AVB > CHB s/p PPM, RBBB, CAD s/p LAD PCI / RCA DES, PVD, abd aortic aneurysm, HTN, HLD, COPD, chronic hypoxemic respiratory failure, pulmonary nodules, DM, CKD III & hypothyroidism seen today for routine electrophysiology followup.   Since last being seen in our clinic the patient reports he has been doing well. He lives independently, continues to drive (just had his eyes checked to keep his license).  He has no concerns regarding his device.   He denies chest pain, palpitations, dyspnea, PND, orthopnea, nausea, vomiting, dizziness, syncope, edema, weight gain, or early satiety.   Review of systems complete and found to be negative unless listed in HPI.   EP Information / Studies Reviewed:    EKG is ordered today. Personal review as below.  EKG Interpretation Date/Time:  Tuesday March 20 2023 13:30:00 EST Ventricular Rate:  94 PR Interval:  168 QRS Duration:  164 QT Interval:  420 QTC Calculation: 525 R Axis:   267  Text Interpretation: Atrial-sensed ventricular-paced rhythm Confirmed by Canary Brim ([id number]) on 03/20/2023 2:12:11 PM   PPM Interrogation-  reviewed in detail today,  See PACEART report.  Device History: Field seismologist PPM implanted 12/17/2019 for CHB  Studies:  Limited ECHO 12/2019 > LVEF 55-60%, no RWMA    Arrhythmia / AAD CHB s/p PPM           Physical Exam:   VS:  BP 138/60   Pulse 94   Ht 5\' 9"  (1.753 m)   Wt 127 lb 3.2 oz (57.7 kg)   SpO2 90%   BMI 18.78 kg/m    Wt Readings from Last 3 Encounters:  03/20/23 127 lb 3.2 oz (57.7 kg)  01/24/23 127 lb (57.6 kg)  11/09/22 123 lb (55.8 kg)     GEN: Well nourished, well developed in no acute  distress NECK: No JVD; No carotid bruits CARDIAC: Regular rate and rhythm (VP), no murmurs, rubs, gallops RESPIRATORY:  Clear to auscultation without rales, wheezing or rhonchi  ABDOMEN: Soft, non-tender, non-distended EXTREMITIES:  No edema; No deformity   ASSESSMENT AND PLAN:    CHB s/p Environmental manager PPM  -Normal PPM function -See Pace Art report -episodes of "PMT" reviewed that are not actually PMT, rather a tracking of atrial intrinsic rate -Presents ASVP, battery 11 years, impedance, sensing, threshold wnl  -No changes today  Disposition:   Follow up with EP APP in 12 months  Signed, Canary Brim, MSN, APRN, NP-C, AGACNP-BC [facility] - Electrophysiology  03/20/2023, 2:15 PM

## 2023-03-20 NOTE — Patient Instructions (Signed)
Medication Instructions:  Your physician recommends that you continue on your current medications as directed. Please refer to the Current Medication list given to you today.  *If you need a refill on your cardiac medications before your next appointment, please call your pharmacy*  Lab Work: None ordered If you have labs (blood work) drawn today and your tests are completely normal, you will receive your results only by: MyChart Message (if you have MyChart) OR A paper copy in the mail If you have any lab test that is abnormal or we need to change your treatment, we will call you to review the results.  Follow-Up: At [facility], you and your health needs are our priority.  As part of our continuing mission to provide you with exceptional heart care, we have created designated Provider Care Teams.  These Care Teams include your primary Cardiologist (physician) and Advanced Practice Providers (APPs -  Physician Assistants and Nurse Practitioners) who all work together to provide you with the care you need, when you need it.  We recommend signing up for the patient portal called "MyChart".  Sign up information is provided on this After Visit Summary.  MyChart is used to connect with patients for Virtual Visits (Telemedicine).  Patients are able to view lab/test results, encounter notes, upcoming appointments, etc.  Non-urgent messages can be sent to your provider as well.   To learn more about what you can do with MyChart, go to [email or web address].    Your next appointment:   1 year(s)  Provider:   Canary Brim, NP

## 2023-03-23 ENCOUNTER — Ambulatory Visit: Payer: Medicare PPO | Attending: Cardiology

## 2023-03-23 DIAGNOSIS — I442 Atrioventricular block, complete: Secondary | ICD-10-CM

## 2023-03-23 LAB — CUP PACEART REMOTE DEVICE CHECK
Battery Remaining Longevity: 132 mo
Battery Remaining Percentage: 100 %
Brady Statistic RA Percent Paced: 4 %
Brady Statistic RV Percent Paced: 99 %
Date Time Interrogation Session: 20241213010100
Implantable Lead Connection Status: 753985
Implantable Lead Connection Status: 753985
Implantable Lead Implant Date: 20210908
Implantable Lead Implant Date: 20210908
Implantable Lead Location: 753859
Implantable Lead Location: 753860
Implantable Lead Model: 7841
Implantable Lead Model: 7842
Implantable Lead Serial Number: 1054557
Implantable Lead Serial Number: 1096552
Implantable Pulse Generator Implant Date: 20210908
Lead Channel Impedance Value: 731 Ohm
Lead Channel Impedance Value: 778 Ohm
Lead Channel Pacing Threshold Amplitude: 0.5 V
Lead Channel Pacing Threshold Amplitude: 0.7 V
Lead Channel Pacing Threshold Pulse Width: 0.4 ms
Lead Channel Pacing Threshold Pulse Width: 0.4 ms
Lead Channel Setting Pacing Amplitude: 1.3 V
Lead Channel Setting Pacing Amplitude: 2.5 V
Lead Channel Setting Pacing Pulse Width: 0.4 ms
Lead Channel Setting Sensing Sensitivity: 3 mV
Pulse Gen Serial Number: 948694
Zone Setting Status: 755011

## 2023-03-26 ENCOUNTER — Ambulatory Visit: Payer: Medicare PPO | Admitting: Primary Care

## 2023-03-26 ENCOUNTER — Encounter: Payer: Self-pay | Admitting: Primary Care

## 2023-03-26 VITALS — BP 118/80 | HR 77 | Temp 96.9°F | Ht 68.0 in | Wt 128.2 lb

## 2023-03-26 DIAGNOSIS — R918 Other nonspecific abnormal finding of lung field: Secondary | ICD-10-CM

## 2023-03-26 DIAGNOSIS — J439 Emphysema, unspecified: Secondary | ICD-10-CM

## 2023-03-26 DIAGNOSIS — J9611 Chronic respiratory failure with hypoxia: Secondary | ICD-10-CM

## 2023-03-26 MED ORDER — GUAIFENESIN ER 600 MG PO TB12: 600.0000 mg | ORAL_TABLET | Freq: Every day | ORAL | 3 refills | Status: DC

## 2023-03-26 NOTE — Progress Notes (Signed)
@Patient  ID: Kristopher [name], male    DOB: 1930-08-02, 89 y.o.   MRN: [id number]  Chief Complaint  Patient presents with   Follow-up    Referring provider: Corwin Levins, MD  HPI: 89 year old male, former smoker.  Has medical history significant for COPD, chronic respiratory failure, pulmonary nodules, hypertension, complete heart block, coronary artery disease, myocardial infarction, abdominal aortic aneurysm, hypothyroidism, diabetes, GERD, osteoarthritis, chronic kidney disease, vitamin D deficiency, vitamin B12 deficiency, hyperlipidemia, weight loss.  03/26/2023- Interim hx  Discussed the use of AI scribe software for clinical note transcription with the patient, who gave verbal consent to proceed.  History of Present Illness   Patient of Dr. Delton Coombes, last seen October 2024 due to pulmonary nodules, COPD, chronic respiratory failure and GERD. Maintained on Spiriva. Trial azithromycin daily to reduce mucus production. Repeat CT scan in fall 2025. He has been on azithromycin for the past two months but has not been taking guaifenesin. He reports a decrease in the amount of mucus production and an improvement in its color since starting the medication. The patient reports occasional coughing up of phlegm, which he believes will be managed by the new medication. He also reports a slight runny nose but declines the offer of a nasal. He is  on Spiriva (tiotropium bromide) 1.73mcg daily, which he takes two puffs of daily and reports it to be working well. However, he notes that he experiences difficulty breathing with exertion, requiring him to rest frequently during activities such as doing dishes. Despite this, he has not used his available oxygen for over a month, citing the weight of the tank as a barrier.  The patient also has a pacemaker, implanted in September of the previous year. He recently had an EKG that showed AV paced rhythm, QT interval 420.     Allergies  Allergen Reactions    Influenza Vaccines Other (See Comments)    unknown   Influenza Virus Vaccine Other (See Comments)    Several reaction.  Took it once woke up two days later in the hospital.    Sulfa Antibiotics Rash    Immunization History  Administered Date(s) Administered   Fluad Quad(high Dose 65+) 02/01/2022   Fluad Trivalent(High Dose 65+) 01/24/2023   PFIZER(Purple Top)SARS-COV-2 Vaccination 08/06/2019, 08/26/2019, 07/07/2020   Pneumococcal Conjugate-13 01/15/2014   Pneumococcal Polysaccharide-23 01/26/2016   Tdap 11/30/2017   Zoster, Live 09/16/2012    Past Medical History:  Diagnosis Date   AAA (abdominal aortic aneurysm) (HCC)    ultrasound 07/2011:  2.7 x 2.8 cm   Acute myocardial infarction, unspecified site, episode of care unspecified 1998   Aortic stenosis    echo 4/12: normal LVF, mild LAE, no significant AS   Arthritis    "all over" (05/21/2017)   CAD (coronary artery disease)    s/p PCI of LAD 1998;  s/p DES to RCA 05/2009;  Myoview 4/13: low risk, no ischemia, EF 56%   CAP (community acquired pneumonia) 05/21/2017   COPD (chronic obstructive pulmonary disease) (HCC)    DJD (degenerative joint disease), lumbar 07/06/2011   Esophageal reflux    HLD (hyperlipidemia)    HTN (hypertension)    PAD (peripheral artery disease) (HCC)    s/p L CIA stent 09/2011 ([place])   Pneumonia    "I've always had pneumonia; since I was 30 months old" (05/21/2017)   Renal artery stenosis (HCC)    s/p bilat RA stents   Urine incontinence     Tobacco History: Social History  Tobacco Use  Smoking Status Former   Current packs/day: 0.00   Average packs/day: 3.0 packs/day for 38.0 years (114.0 ttl pk-yrs)   Types: Cigarettes   Start date: 04/10/1950   Quit date: 04/10/1988   Years since quitting: 34.9  Smokeless Tobacco Never   Counseling given: Not Answered   Outpatient Medications Prior to Visit  Medication Sig Dispense Refill   Ascorbic Acid (VITAMIN C) 500 MG tablet Take 500 mg by mouth 2  (two) times daily.     aspirin 81 MG tablet Take 1 tablet (81 mg total) by mouth daily.     azithromycin (ZITHROMAX) 250 MG tablet Take 1 tablet (250 mg total) by mouth daily. 90 tablet 3   BEE POLLEN PO Take 1 capsule by mouth 2 (two) times daily.      Cholecalciferol (VITAMIN D3) 50 MCG (2000 UT) TABS Take by mouth.     cyanocobalamin (VITAMIN B12) 1000 MCG tablet Take 1 tablet (1,000 mcg total) by mouth daily. 90 tablet 3   cyclobenzaprine (FLEXERIL) 5 MG tablet TAKE 1 TABLET(5 MG) BY MOUTH AT BEDTIME AS NEEDED FOR MUSCLE SPASMS 30 tablet 2   diclofenac sodium (VOLTAREN) 1 % GEL APPLY 2 GRAMS TOPICALLY FOUR TIMES A DAY AS NEEDED. 700 g 4   Docusate Calcium (STOOL SOFTENER PO) Take 2 capsules by mouth daily.     Multiple Vitamin (MULTIVITAMIN) capsule Take 1 capsule by mouth daily.     Multiple Vitamins-Minerals (PRESERVISION AREDS PO) Take 2 tablets by mouth daily.     nitroGLYCERIN (NITROSTAT) 0.4 MG SL tablet Place 1 tablet (0.4 mg total) under the tongue every 5 (five) minutes as needed for chest pain. 90 tablet 3   NON FORMULARY Superfood daily     omeprazole (PRILOSEC) 40 MG capsule Take 1 capsule (40 mg total) by mouth in the morning and at bedtime. 180 capsule 3   OVER THE COUNTER MEDICATION Take 2 capsules by mouth every morning. NITRIC OXIDE     rosuvastatin (CRESTOR) 40 MG tablet TAKE 1 TABLET DAILY (KEEP SCHEDULED OFFICE VISIT FOR FUTURE REFILLS) 90 tablet 3   SPIRIVA RESPIMAT 1.25 MCG/ACT AERS USE 2 INHALATIONS DAILY 12 g 3   tamsulosin (FLOMAX) 0.4 MG CAPS capsule Take 0.4 mg by mouth daily.     No facility-administered medications prior to visit.   Review of Systems  Review of Systems  Constitutional:  Positive for fatigue.  HENT:  Positive for postnasal drip.   Respiratory:  Positive for cough. Negative for chest tightness, shortness of breath and wheezing.        DOE/fatigue with exertion, recovers with rest   Cardiovascular: Negative.    Physical Exam  There were  no vitals taken for this visit. Physical Exam Constitutional:      General: He is not in acute distress.    Appearance: Normal appearance. He is not ill-appearing.  HENT:     Mouth/Throat:     Mouth: Mucous membranes are moist.     Pharynx: Oropharynx is clear.  Cardiovascular:     Rate and Rhythm: Normal rate and regular rhythm.     Comments: Paced  Pulmonary:     Effort: Pulmonary effort is normal.     Breath sounds: Normal breath sounds. No wheezing, rhonchi or rales.     Comments: CTA; upper airway congestion which clears with cough  Musculoskeletal:        General: Normal range of motion.  Skin:    General: Skin is warm and  dry.  Neurological:     Mental Status: He is alert.  Psychiatric:        Mood and Affect: Mood normal.        Behavior: Behavior normal.        Thought Content: Thought content normal.        Judgment: Judgment normal.      Lab Results:  CBC    Component Value Date/Time   WBC 8.6 02/01/2022 1142   RBC 3.86 (L) 02/01/2022 1142   HGB 10.6 (L) 02/01/2022 1142   HGB 12.1 (L) 12/16/2019 1508   HCT 32.8 (L) 02/01/2022 1142   HCT 37.7 12/16/2019 1508   PLT 220.0 02/01/2022 1142   PLT 203 12/16/2019 1508   MCV 84.8 02/01/2022 1142   MCV 95 12/16/2019 1508   MCH 30.6 12/16/2019 1508   MCH 30.1 12/03/2019 1231   MCHC 32.3 02/01/2022 1142   RDW 19.4 (H) 02/01/2022 1142   RDW 14.3 12/16/2019 1508   LYMPHSABS 1.9 02/01/2022 1142   LYMPHSABS 2.3 12/16/2019 1508   MONOABS 0.9 02/01/2022 1142   EOSABS 0.3 02/01/2022 1142   EOSABS 0.2 12/16/2019 1508   BASOSABS 0.1 02/01/2022 1142   BASOSABS 0.1 12/16/2019 1508    BMET    Component Value Date/Time   NA 139 02/01/2022 1142   NA 144 12/16/2019 1508   K 4.2 02/01/2022 1142   CL 106 02/01/2022 1142   CO2 26 02/01/2022 1142   GLUCOSE 121 (H) 02/01/2022 1142   BUN 35 (H) 02/01/2022 1142   BUN 31 (H) 12/16/2019 1508   CREATININE 1.54 (H) 02/01/2022 1142   CREATININE 1.79 (H) 12/03/2019 1231    CALCIUM 9.3 02/01/2022 1142   GFRNONAA 37 (L) 12/17/2019 1125   GFRNONAA 33 (L) 12/03/2019 1231   GFRAA 43 (L) 12/17/2019 1125   GFRAA 39 (L) 12/03/2019 1231    BNP No results found for: "BNP"  ProBNP    Component Value Date/Time   PROBNP 56.0 05/19/2009 0000    Imaging: CUP PACEART REMOTE DEVICE CHECK Result Date: 03/23/2023 Scheduled remote reviewed. Normal device function.  Next remote 91 days. LA, CVRS    Assessment & Plan:   1. Pulmonary nodules (Primary)  2. Chronic hypoxemic respiratory failure (HCC)  3. Pulmonary emphysema, unspecified emphysema type (HCC)      Chronic Bronchitis Improvement in mucus production and color since starting Azithromycin 250mg  daily.  Recent EKG in December done by cardiology and reviewed, AV paced. QT interval 420.  -Continue Azithromycin 250mg  daily. -Start Mucinex 1 tablet daily to further aid in mucus clearance. -Encourage deep breathing and coughing exercises 3-4 times a day.  COPD Moderate exertional dyspnea managed with Spiriva (tiotropium bromide) 2 puffs daily. -Continue Spiriva Respimat 1.45mcg two puffs daily. -Ambulatory walk test performed, patient was able to maintain O2 >94% RA, he had to stop due to arthritis and fatigue  -Consider pulmonary rehab; use of available oxygen during exertion if needed.  Pulmonary nodule -Due for follow-up Super D CT scan in September 2025  Pacemaker Regular paced rhythm noted on examination. -Continue regular monitoring as per cardiology.  Follow-up in 3-4 months to monitor response to daily Azithromycin.      Glenford Bayley, NP 03/26/2023

## 2023-03-26 NOTE — Patient Instructions (Addendum)
  YOUR PLAN:  -COPD: Chronic Obstructive Pulmonary Disease (COPD) is a group of lung conditions that cause breathing difficulties. You will continue taking Spiriva 2 puffs daily. If you experience difficulty breathing during activities, consider using your available oxygen.  -CHRONIC BRONCHITIS: Chronic bronchitis is a long-term inflammation of the airways in the lungs, leading to mucus production and coughing. You will continue taking Azithromycin 250mg  daily and start Mucinex 1 tablet daily to help clear mucus. Please practice deep breathing and coughing exercises 3-4 times a day.  -PACEMAKER: A pacemaker is a device implanted to help control abnormal heart rhythms. Your pacemaker is functioning well, and we will continue regular monitoring as advised by your cardiologist.  Consider pulmonary rehab, no oxygen need on simple walk test today  INSTRUCTIONS: Please follow up in 3-4 months with Dr. Delton Coombes

## 2023-06-22 ENCOUNTER — Ambulatory Visit (INDEPENDENT_AMBULATORY_CARE_PROVIDER_SITE_OTHER): Payer: Medicare PPO

## 2023-06-22 DIAGNOSIS — I442 Atrioventricular block, complete: Secondary | ICD-10-CM | POA: Diagnosis not present

## 2023-06-22 LAB — CUP PACEART REMOTE DEVICE CHECK
Battery Remaining Longevity: 132 mo
Battery Remaining Percentage: 100 %
Brady Statistic RA Percent Paced: 2 %
Brady Statistic RV Percent Paced: 75 %
Date Time Interrogation Session: 20250314010100
Implantable Lead Connection Status: 753985
Implantable Lead Connection Status: 753985
Implantable Lead Implant Date: 20210908
Implantable Lead Implant Date: 20210908
Implantable Lead Location: 753859
Implantable Lead Location: 753860
Implantable Lead Model: 7841
Implantable Lead Model: 7842
Implantable Lead Serial Number: 1054557
Implantable Lead Serial Number: 1096552
Implantable Pulse Generator Implant Date: 20210908
Lead Channel Impedance Value: 707 Ohm
Lead Channel Impedance Value: 828 Ohm
Lead Channel Pacing Threshold Amplitude: 0.6 V
Lead Channel Pacing Threshold Amplitude: 0.8 V
Lead Channel Pacing Threshold Pulse Width: 0.4 ms
Lead Channel Pacing Threshold Pulse Width: 0.4 ms
Lead Channel Setting Pacing Amplitude: 1.3 V
Lead Channel Setting Pacing Amplitude: 2.5 V
Lead Channel Setting Pacing Pulse Width: 0.4 ms
Lead Channel Setting Sensing Sensitivity: 3 mV
Pulse Gen Serial Number: 948694
Zone Setting Status: 755011

## 2023-07-25 NOTE — Progress Notes (Signed)
 Remote pacemaker transmission.

## 2023-09-21 ENCOUNTER — Ambulatory Visit (INDEPENDENT_AMBULATORY_CARE_PROVIDER_SITE_OTHER): Payer: Medicare PPO

## 2023-09-21 DIAGNOSIS — I442 Atrioventricular block, complete: Secondary | ICD-10-CM | POA: Diagnosis not present

## 2023-09-21 LAB — CUP PACEART REMOTE DEVICE CHECK
Battery Remaining Longevity: 126 mo
Battery Remaining Percentage: 100 %
Brady Statistic RA Percent Paced: 2 %
Brady Statistic RV Percent Paced: 83 %
Date Time Interrogation Session: 20250613010100
Implantable Lead Connection Status: 753985
Implantable Lead Connection Status: 753985
Implantable Lead Implant Date: 20210908
Implantable Lead Implant Date: 20210908
Implantable Lead Location: 753859
Implantable Lead Location: 753860
Implantable Lead Model: 7841
Implantable Lead Model: 7842
Implantable Lead Serial Number: 1054557
Implantable Lead Serial Number: 1096552
Implantable Pulse Generator Implant Date: 20210908
Lead Channel Impedance Value: 708 Ohm
Lead Channel Impedance Value: 816 Ohm
Lead Channel Pacing Threshold Amplitude: 0.5 V
Lead Channel Pacing Threshold Amplitude: 0.8 V
Lead Channel Pacing Threshold Pulse Width: 0.4 ms
Lead Channel Pacing Threshold Pulse Width: 0.4 ms
Lead Channel Setting Pacing Amplitude: 1.2 V
Lead Channel Setting Pacing Amplitude: 2.5 V
Lead Channel Setting Pacing Pulse Width: 0.4 ms
Lead Channel Setting Sensing Sensitivity: 3 mV
Pulse Gen Serial Number: 948694
Zone Setting Status: 755011

## 2023-09-22 ENCOUNTER — Ambulatory Visit: Payer: Self-pay | Admitting: Cardiology

## 2023-11-08 NOTE — Progress Notes (Signed)
 Remote pacemaker transmission.

## 2023-11-12 ENCOUNTER — Telehealth: Payer: Self-pay | Admitting: Internal Medicine

## 2023-11-12 NOTE — Telephone Encounter (Signed)
 Patient dropped off document Insurance Form and Handicap Placard, to be filled out by provider. Patient requested to send it back via Call Patient to pick up within 7-days. Document is located in providers tray at front office.Please advise at [facility] [phone]

## 2023-11-14 NOTE — Telephone Encounter (Signed)
 Called and informed patient of form completion. Form has been placed up front for pick up

## 2023-11-14 NOTE — Telephone Encounter (Signed)
 Form received, filled out, awaiting signature from provider

## 2023-11-15 ENCOUNTER — Ambulatory Visit: Payer: Medicare PPO

## 2023-11-15 VITALS — Ht 68.0 in | Wt 128.0 lb

## 2023-11-15 DIAGNOSIS — Z0000 Encounter for general adult medical examination without abnormal findings: Secondary | ICD-10-CM | POA: Diagnosis not present

## 2023-11-15 NOTE — Patient Instructions (Addendum)
 Mr. Vickerman , Thank you for taking time out of your busy schedule to complete your Annual Wellness Visit with me. I enjoyed our conversation and look forward to speaking with you again next year. I, as well as your care team,  appreciate your ongoing commitment to your health goals. Please review the following plan we discussed and let me know if I can assist you in the future. Your Game plan/ To Do List    Referrals: If you haven't heard from the office you've been referred to, please reach out to them at the phone provided.   Follow up Visits: We will see or speak with you next year for your Next Medicare AWV with our clinical staff Have you seen your provider in the last 6 months (3 months if uncontrolled diabetes)? No  Clinician Recommendations:  Aim for 30 minutes of exercise or brisk walking, 6-8 glasses of water, and 5 servings of fruits and vegetables each day. Educated and advised on getting the Shingles vaccines.      This is a list of the screenings recommended for you:  Health Maintenance  Topic Date Due   Complete foot exam   Never done   Eye exam for diabetics  Never done   Zoster (Shingles) Vaccine (1 of 2) 09/24/1949   Hemoglobin A1C  08/03/2022   COVID-19 Vaccine (4 - 2024-25 season) 12/10/2022   Medicare Annual Wellness Visit  11/14/2024   DTaP/Tdap/Td vaccine (2 - Td or Tdap) 12/01/2027   Pneumococcal Vaccine for age over 25  Completed   Hepatitis B Vaccine  Aged Out   HPV Vaccine  Aged Out   Meningitis B Vaccine  Aged Out    Advanced directives: (Provided) Advance directive discussed with you today. I have provided a copy for you to complete at home and have notarized. Once this is complete, please bring a copy in to our office so we can scan it into your chart.  Advance Care Planning is important because it:  [x]  Makes sure you receive the medical care that is consistent with your values, goals, and preferences  [x]  It provides guidance to your family and loved  ones and reduces their decisional burden about whether or not they are making the right decisions based on your wishes.  Follow the link provided in your after visit summary or read over the paperwork we have mailed to you to help you started getting your Advance Directives in place. If you need assistance in completing these, please reach out to us  so that we can help you!

## 2023-11-15 NOTE — Progress Notes (Signed)
 Subjective:   Kristopher [name] is a 89 y.o. who presents for a Medicare Wellness preventive visit.  As a reminder, Annual Wellness Visits don't include a physical exam, and some assessments may be limited, especially if this visit is performed virtually. We may recommend an in-person follow-up visit with your provider if needed.  Visit Complete: Virtual I connected with  Kristopher [name] on 11/15/23 by a audio enabled telemedicine application and verified that I am speaking with the correct person using two identifiers.  Patient Location: Home  Provider Location: Office/Clinic  I discussed the limitations of evaluation and management by telemedicine. The patient expressed understanding and agreed to proceed.  Vital Signs: Because this visit was a virtual/telehealth visit, some criteria may be missing or patient reported. Any vitals not documented were not able to be obtained and vitals that have been documented are patient reported.  VideoDeclined- This patient declined Librarian, academic. Therefore the visit was completed with audio only.  Persons Participating in Visit: Patient.  AWV Questionnaire: No: Patient Medicare AWV questionnaire was not completed prior to this visit.  Cardiac Risk Factors include: advanced age (>71men, >31 women);dyslipidemia;diabetes mellitus;hypertension;male gender     Objective:    Today's Vitals   11/15/23 1347  Weight: 128 lb (58.1 kg)  Height: 5' 8 (1.727 m)   Body mass index is 19.46 kg/m.     11/15/2023    2:07 PM 11/09/2022    1:55 PM 09/01/2021   12:07 PM 08/31/2020    1:39 PM 12/17/2019   10:00 PM 12/17/2019   10:20 AM 05/21/2017    8:39 PM  Advanced Directives  Does Patient Have a Medical Advance Directive? No Yes Yes Yes No No No   Type of Special educational needs teacher of [place];Living will Living will;Healthcare Power of Attorney Living will     Does patient want to make changes to medical advance  directive?   No - Patient declined No - Patient declined     Copy of Healthcare Power of Attorney in Chart?  No - copy requested No - copy requested      Would patient like information on creating a medical advance directive? Yes (MAU/Ambulatory/Procedural Areas - Information given)    No - Patient declined No - Patient declined No - Patient declined      Data saved with a previous flowsheet row definition    Current Medications (verified) Outpatient Encounter Medications as of 11/15/2023  Medication Sig   Ascorbic Acid  (VITAMIN C ) 500 MG tablet Take 500 mg by mouth 2 (two) times daily.   aspirin  81 MG tablet Take 1 tablet (81 mg total) by mouth daily.   BEE POLLEN PO Take 1 capsule by mouth 2 (two) times daily.    Cholecalciferol (VITAMIN D3) 50 MCG (2000 UT) TABS Take by mouth.   cyanocobalamin  (VITAMIN B12) 1000 MCG tablet Take 1 tablet (1,000 mcg total) by mouth daily.   cyclobenzaprine  (FLEXERIL ) 5 MG tablet TAKE 1 TABLET(5 MG) BY MOUTH AT BEDTIME AS NEEDED FOR MUSCLE SPASMS   diclofenac  sodium (VOLTAREN ) 1 % GEL APPLY 2 GRAMS TOPICALLY FOUR TIMES A DAY AS NEEDED.   Docusate Calcium  (STOOL SOFTENER PO) Take 2 capsules by mouth daily.   Multiple Vitamin (MULTIVITAMIN) capsule Take 1 capsule by mouth daily.   Multiple Vitamins-Minerals (PRESERVISION AREDS PO) Take 2 tablets by mouth daily.   nitroGLYCERIN  (NITROSTAT ) 0.4 MG SL tablet Place 1 tablet (0.4 mg total) under the tongue every 5 (  five) minutes as needed for chest pain.   NON FORMULARY Superfood daily   omeprazole  (PRILOSEC) 40 MG capsule Take 1 capsule (40 mg total) by mouth in the morning and at bedtime.   OVER THE COUNTER MEDICATION Take 2 capsules by mouth every morning. NITRIC OXIDE   rosuvastatin  (CRESTOR ) 40 MG tablet TAKE 1 TABLET DAILY (KEEP SCHEDULED OFFICE VISIT FOR FUTURE REFILLS)   SPIRIVA  RESPIMAT 1.25 MCG/ACT AERS USE 2 INHALATIONS DAILY   tamsulosin  (FLOMAX ) 0.4 MG CAPS capsule Take 0.4 mg by mouth daily.    azithromycin  (ZITHROMAX ) 250 MG tablet Take 1 tablet (250 mg total) by mouth daily. (Patient not taking: Reported on 11/15/2023)   guaiFENesin  (MUCINEX ) 600 MG 12 hr tablet Take 1 tablet (600 mg total) by mouth daily. (Patient not taking: Reported on 11/15/2023)   No facility-administered encounter medications on file as of 11/15/2023.    Allergies (verified) Influenza vaccines, Influenza virus vaccine, and Sulfa antibiotics   History: Past Medical History:  Diagnosis Date   AAA (abdominal aortic aneurysm) (HCC)    ultrasound 07/2011:  2.7 x 2.8 cm   Acute myocardial infarction, unspecified site, episode of care unspecified 1998   Aortic stenosis    echo 4/12: normal LVF, mild LAE, no significant AS   Arthritis    all over (05/21/2017)   CAD (coronary artery disease)    s/p PCI of LAD 1998;  s/p DES to RCA 05/2009;  Myoview  4/13: low risk, no ischemia, EF 56%   CAP (community acquired pneumonia) 05/21/2017   COPD (chronic obstructive pulmonary disease) (HCC)    DJD (degenerative joint disease), lumbar 07/06/2011   Esophageal reflux    HLD (hyperlipidemia)    HTN (hypertension)    PAD (peripheral artery disease) (HCC)    s/p L CIA stent 09/2011 ([facility])   Pneumonia    I've always had pneumonia; since I was 55 months old (05/21/2017)   Renal artery stenosis (HCC)    s/p bilat RA stents   Urine incontinence    Past Surgical History:  Procedure Laterality Date   CATARACT EXTRACTION W/ INTRAOCULAR LENS  IMPLANT, BILATERAL Bilateral YRS AGO   CORONARY ANGIOPLASTY WITH STENT PLACEMENT  05/1996   PCI of LAD; I've got a total of 4 stents (05/21/2017)   HEMORRHOID SURGERY  1954   LOWER EXTREMITY ANGIOGRAM Bilateral 09/20/2011   Procedure: LOWER EXTREMITY ANGIOGRAM;  Surgeon: Deatrice DELENA Cage, MD;  Location: [facility] CATH LAB;  Service: Cardiovascular;  Laterality: Bilateral;   PACEMAKER IMPLANT N/A 12/17/2019   Procedure: PACEMAKER IMPLANT;  Surgeon: Cindie Ole DASEN, MD;  Location: [facility] INVASIVE CV  LAB;  Service: Cardiovascular;  Laterality: N/A;   TRANSURETHRAL RESECTION OF PROSTATE  08/11/2011   Procedure: TRANSURETHRAL RESECTION OF THE PROSTATE WITH GYRUS INSTRUMENTS;  Surgeon: Donnice Gwenyth Brooks, MD;  Location: [facility] ORS;  Service: Urology;  Laterality: N/A;      VASECTOMY  1968   Family History  Problem Relation Age of Onset   Heart disease Mother    Heart disease Father    Social History   Socioeconomic History   Marital status: Widowed    Spouse name: Not on file   Number of children: Not on file   Years of education: GED   Highest education level: Not on file  Occupational History   Occupation: retired  Tobacco Use   Smoking status: Former    Current packs/day: 0.00    Average packs/day: 3.0 packs/day for 38.0 years (114.0 ttl pk-yrs)  Types: Cigarettes    Start date: 04/10/1950    Quit date: 04/10/1988    Years since quitting: 35.6   Smokeless tobacco: Never  Vaping Use   Vaping status: Never Used  Substance and Sexual Activity   Alcohol  use: Yes    Alcohol /week: 1.0 standard drink of alcohol     Types: 1 Cans of beer per week    Comment: only on holidays   Drug use: No   Sexual activity: Not Currently  Other Topics Concern   Not on file  Social History Narrative   Widowed   Social Drivers of Health   Financial Resource Strain: Low Risk  (11/15/2023)   Overall Financial Resource Strain (CARDIA)    Difficulty of Paying Living Expenses: Not hard at all  Food Insecurity: No Food Insecurity (11/15/2023)   Hunger Vital Sign    Worried About Running Out of Food in the Last Year: Never true    Ran Out of Food in the Last Year: Never true  Transportation Needs: No Transportation Needs (11/15/2023)   PRAPARE - Administrator, Civil Service (Medical): No    Lack of Transportation (Non-Medical): No  Physical Activity: Insufficiently Active (11/15/2023)   Exercise Vital Sign    Days of Exercise per Week: 1 day    Minutes of Exercise per Session: 10 min   Stress: No Stress Concern Present (11/15/2023)   [employer] of Occupational Health - Occupational Stress Questionnaire    Feeling of Stress: Not at all  Social Connections: Moderately Isolated (11/15/2023)   Social Connection and Isolation Panel    Frequency of Communication with Friends and Family: More than three times a week    Frequency of Social Gatherings with Friends and Family: Once a week    Attends Religious Services: Never    Database administrator or Organizations: Yes    Attends Banker Meetings: Never    Marital Status: Widowed    Tobacco Counseling Counseling given: No    Clinical Intake:  Pre-visit preparation completed: Yes  Pain : No/denies pain     BMI - recorded: 19.46 Nutritional Status: BMI of 19-24  Normal Nutritional Risks: None Diabetes: Yes CBG done?: No Did pt. bring in CBG monitor from home?: No  Lab Results  Component Value Date   HGBA1C 6.8 (H) 02/01/2022   HGBA1C 6.9 (H) 08/02/2021   HGBA1C 6.4 02/22/2021     How often do you need to have someone help you when you read instructions, pamphlets, or other written materials from your doctor or pharmacy?: 1 - Never  Interpreter Needed?: No  Information entered by :: Verdie Saba, CMA   Activities of Daily Living     11/15/2023    1:53 PM  In your present state of health, do you have any difficulty performing the following activities:  Hearing? 0  Vision? 1  Comment has an appt w/Opthalmologist - dtr helps sometimes  Difficulty concentrating or making decisions? 0  Walking or climbing stairs? 0  Dressing or bathing? 0  Doing errands, shopping? 0  Preparing Food and eating ? N  Using the Toilet? N  In the past six months, have you accidently leaked urine? Y  Do you have problems with loss of bowel control? N  Managing your Medications? N  Managing your Finances? N  Housekeeping or managing your Housekeeping? N    Patient Care Team: Norleen Lynwood ORN, MD as PCP -  General (Internal Medicine) Pietro Redell RAMAN, MD  as PCP - Cardiology (Cardiology) Cindie Ole DASEN, MD as PCP - Electrophysiology (Cardiology) Pietro Redell RAMAN, MD as Consulting Physician (Cardiology) Camillo Golas, MD as Consulting Physician (Ophthalmology) Carilyn Prentice BRAVO, MD as Consulting Physician (Pain Medicine)  I have updated your Care Teams any recent Medical Services you may have received from other providers in the past year.     Assessment:   This is a routine wellness examination for Kristopher [name].  Hearing/Vision screen Hearing Screening - Comments:: Denies hearing difficulties   Vision Screening - Comments:: Wears rx glasses - up to date with routine eye exams with an Ophthalmologist    Goals Addressed               This Visit's Progress     Patient Stated (pt-stated)        Patient stated he's wanting to exercise more        Depression Screen     11/15/2023    1:55 PM 11/09/2022    2:04 PM 02/22/2022    9:56 AM 02/01/2022   10:58 AM 09/01/2021   12:00 PM 08/02/2021   11:30 AM 08/02/2021   11:14 AM  PHQ 2/9 Scores  PHQ - 2 Score 0 1 0 0 0 0 0  PHQ- 9 Score 1 1  0   3    Fall Risk     11/15/2023    1:54 PM 03/26/2023    9:16 AM 11/09/2022    1:56 PM 02/22/2022    9:56 AM 02/01/2022   10:58 AM  Fall Risk   Falls in the past year? 0 0 0 0 0  Number falls in past yr: 0  0 0   Injury with Fall? 0  0 0   Risk for fall due to : No Fall Risks;Impaired balance/gait  No Fall Risks  No Fall Risks  Follow up Falls evaluation completed;Falls prevention discussed  Falls prevention discussed  Falls evaluation completed      Data saved with a previous flowsheet row definition    MEDICARE RISK AT HOME:  Medicare Risk at Home Any stairs in or around the home?: Yes If so, are there any without handrails?: No Home free of loose throw rugs in walkways, pet beds, electrical cords, etc?: Yes Adequate lighting in your home to reduce risk of falls?: Yes Life alert?:  No Use of a cane, walker or w/c?: Yes (cane) Grab bars in the bathroom?: No Shower chair or bench in shower?: Yes Elevated toilet seat or a handicapped toilet?: Yes  TIMED UP AND GO:  Was the test performed?  No  Cognitive Function: 6CIT completed    05/05/2016    8:26 AM  MMSE - Mini Mental State Exam  Orientation to time 5   Orientation to Place 5   Registration 3   Attention/ Calculation 5   Recall 2   Language- name 2 objects 2   Language- repeat 1  Language- follow 3 step command 3   Language- read & follow direction 1   Write a sentence 1   Copy design 1   Total score 29      Data saved with a previous flowsheet row definition        11/15/2023    2:00 PM 11/09/2022    1:57 PM 09/01/2021   12:11 PM  6CIT Screen  What Year? 0 points 0 points 0 points  What month? 0 points 0 points 0 points  What time? 0 points 0 points 0  points  Count back from 20 0 points 0 points 0 points  Months in reverse 0 points 0 points 0 points  Repeat phrase 2 points 2 points 0 points  Total Score 2 points 2 points 0 points    Immunizations Immunization History  Administered Date(s) Administered   Fluad Quad(high Dose 65+) 02/01/2022   Fluad Trivalent(High Dose 65+) 01/24/2023   PFIZER(Purple Top)SARS-COV-2 Vaccination 08/06/2019, 08/26/2019, 07/07/2020   Pneumococcal Conjugate-13 01/15/2014   Pneumococcal Polysaccharide-23 01/26/2016   Tdap 11/30/2017   Zoster, Live 09/16/2012    Screening Tests Health Maintenance  Topic Date Due   FOOT EXAM  Never done   OPHTHALMOLOGY EXAM  Never done   Zoster Vaccines- Shingrix (1 of 2) 09/24/1949   HEMOGLOBIN A1C  08/03/2022   COVID-19 Vaccine (4 - 2024-25 season) 12/10/2022   Medicare Annual Wellness (AWV)  11/14/2024   DTaP/Tdap/Td (2 - Td or Tdap) 12/01/2027   Pneumococcal Vaccine: 50+ Years  Completed   Hepatitis B Vaccines  Aged Out   HPV VACCINES  Aged Out   Meningococcal B Vaccine  Aged Out    Health Maintenance  Health  Maintenance Due  Topic Date Due   FOOT EXAM  Never done   OPHTHALMOLOGY EXAM  Never done   Zoster Vaccines- Shingrix (1 of 2) 09/24/1949   HEMOGLOBIN A1C  08/03/2022   COVID-19 Vaccine (4 - 2024-25 season) 12/10/2022   Health Maintenance Items Addressed: 11/15/2023   Additional Screening:  Vision Screening: Recommended annual ophthalmology exams for early detection of glaucoma and other disorders of the eye. Would you like a referral to an eye doctor? No    Dental Screening: Recommended annual dental exams for proper oral hygiene  Community Resource Referral / Chronic Care Management: CRR required this visit?  No   CCM required this visit?  No   Plan:    I have personally reviewed and noted the following in the patient's chart:   Medical and social history Use of alcohol , tobacco or illicit drugs  Current medications and supplements including opioid prescriptions. Patient is not currently taking opioid prescriptions. Functional ability and status Nutritional status Physical activity Advanced directives List of other physicians Hospitalizations, surgeries, and ER visits in previous 12 months Vitals Screenings to include cognitive, depression, and falls Referrals and appointments  In addition, I have reviewed and discussed with patient certain preventive protocols, quality metrics, and best practice recommendations. A written personalized care plan for preventive services as well as general preventive health recommendations were provided to patient.   Verdie CHRISTELLA Saba, CMA   11/15/2023   After Visit Summary: (Declined) Due to this being a telephonic visit, with patients personalized plan was offered to patient but patient Declined AVS at this time   Notes: Scheduled an appt w/PCP for a 1-yr f/u Diabetes/HTN (last seen 02/01/2022).

## 2023-11-20 ENCOUNTER — Encounter: Payer: Self-pay | Admitting: Internal Medicine

## 2023-11-20 ENCOUNTER — Ambulatory Visit (INDEPENDENT_AMBULATORY_CARE_PROVIDER_SITE_OTHER): Admitting: Internal Medicine

## 2023-11-20 ENCOUNTER — Ambulatory Visit: Payer: Self-pay | Admitting: Internal Medicine

## 2023-11-20 VITALS — BP 138/82 | HR 85 | Ht 68.0 in | Wt 126.6 lb

## 2023-11-20 DIAGNOSIS — D649 Anemia, unspecified: Secondary | ICD-10-CM | POA: Diagnosis not present

## 2023-11-20 DIAGNOSIS — E559 Vitamin D deficiency, unspecified: Secondary | ICD-10-CM | POA: Diagnosis not present

## 2023-11-20 DIAGNOSIS — I10 Essential (primary) hypertension: Secondary | ICD-10-CM

## 2023-11-20 DIAGNOSIS — E1165 Type 2 diabetes mellitus with hyperglycemia: Secondary | ICD-10-CM | POA: Diagnosis not present

## 2023-11-20 DIAGNOSIS — E7800 Pure hypercholesterolemia, unspecified: Secondary | ICD-10-CM | POA: Diagnosis not present

## 2023-11-20 DIAGNOSIS — Z0001 Encounter for general adult medical examination with abnormal findings: Secondary | ICD-10-CM | POA: Diagnosis not present

## 2023-11-20 DIAGNOSIS — E039 Hypothyroidism, unspecified: Secondary | ICD-10-CM | POA: Diagnosis not present

## 2023-11-20 DIAGNOSIS — E538 Deficiency of other specified B group vitamins: Secondary | ICD-10-CM

## 2023-11-20 DIAGNOSIS — N1831 Chronic kidney disease, stage 3a: Secondary | ICD-10-CM | POA: Diagnosis not present

## 2023-11-20 LAB — HEPATIC FUNCTION PANEL
ALT: 20 U/L (ref 0–53)
AST: 28 U/L (ref 0–37)
Albumin: 4.1 g/dL (ref 3.5–5.2)
Alkaline Phosphatase: 91 U/L (ref 39–117)
Bilirubin, Direct: 0 mg/dL (ref 0.0–0.3)
Total Bilirubin: 0.3 mg/dL (ref 0.2–1.2)
Total Protein: 7 g/dL (ref 6.0–8.3)

## 2023-11-20 LAB — URINALYSIS, ROUTINE W REFLEX MICROSCOPIC
Bilirubin Urine: NEGATIVE
Hgb urine dipstick: NEGATIVE
Ketones, ur: NEGATIVE
Leukocytes,Ua: NEGATIVE
Nitrite: NEGATIVE
RBC / HPF: NONE SEEN (ref 0–?)
Specific Gravity, Urine: 1.02 (ref 1.000–1.030)
Urine Glucose: NEGATIVE
Urobilinogen, UA: 0.2 (ref 0.0–1.0)
pH: 6 (ref 5.0–8.0)

## 2023-11-20 LAB — CBC WITH DIFFERENTIAL/PLATELET
Basophils Absolute: 0.1 K/uL (ref 0.0–0.1)
Basophils Relative: 0.9 % (ref 0.0–3.0)
Eosinophils Absolute: 0.7 K/uL (ref 0.0–0.7)
Eosinophils Relative: 7.7 % — ABNORMAL HIGH (ref 0.0–5.0)
HCT: 36.9 % — ABNORMAL LOW (ref 39.0–52.0)
Hemoglobin: 12.1 g/dL — ABNORMAL LOW (ref 13.0–17.0)
Lymphocytes Relative: 24 % (ref 12.0–46.0)
Lymphs Abs: 2.2 K/uL (ref 0.7–4.0)
MCHC: 32.7 g/dL (ref 30.0–36.0)
MCV: 91.9 fl (ref 78.0–100.0)
Monocytes Absolute: 0.9 K/uL (ref 0.1–1.0)
Monocytes Relative: 9.4 % (ref 3.0–12.0)
Neutro Abs: 5.4 K/uL (ref 1.4–7.7)
Neutrophils Relative %: 58 % (ref 43.0–77.0)
Platelets: 243 K/uL (ref 150.0–400.0)
RBC: 4.01 Mil/uL — ABNORMAL LOW (ref 4.22–5.81)
RDW: 15.8 % — ABNORMAL HIGH (ref 11.5–15.5)
WBC: 9.3 K/uL (ref 4.0–10.5)

## 2023-11-20 LAB — BASIC METABOLIC PANEL WITH GFR
BUN: 36 mg/dL — ABNORMAL HIGH (ref 6–23)
CO2: 27 meq/L (ref 19–32)
Calcium: 9.3 mg/dL (ref 8.4–10.5)
Chloride: 108 meq/L (ref 96–112)
Creatinine, Ser: 1.29 mg/dL (ref 0.40–1.50)
GFR: 48.59 mL/min — ABNORMAL LOW (ref >60.00)
Glucose, Bld: 120 mg/dL — ABNORMAL HIGH (ref 70–99)
Potassium: 5 meq/L (ref 3.5–5.1)
Sodium: 143 meq/L (ref 135–145)

## 2023-11-20 LAB — LIPID PANEL
Cholesterol: 126 mg/dL (ref 0–200)
HDL: 49.4 mg/dL (ref >39.00)
LDL Cholesterol: 58 mg/dL (ref 0–99)
NonHDL: 76.23
Total CHOL/HDL Ratio: 3
Triglycerides: 93 mg/dL (ref 0.0–149.0)
VLDL: 18.6 mg/dL (ref 0.0–40.0)

## 2023-11-20 LAB — MICROALBUMIN / CREATININE URINE RATIO
Creatinine,U: 15.7 mg/dL
Microalb Creat Ratio: 272.9 mg/g — ABNORMAL HIGH (ref 0.0–30.0)
Microalb, Ur: 4.3 mg/dL — ABNORMAL HIGH (ref 0.0–1.9)

## 2023-11-20 LAB — TSH: TSH: 7.49 u[IU]/mL — ABNORMAL HIGH (ref 0.35–5.50)

## 2023-11-20 LAB — FERRITIN: Ferritin: 15.2 ng/mL — ABNORMAL LOW (ref 22.0–322.0)

## 2023-11-20 LAB — IBC PANEL
Iron: 50 ug/dL (ref 42–165)
Saturation Ratios: 13.3 % — ABNORMAL LOW (ref 20.0–50.0)
TIBC: 375.2 ug/dL (ref 250.0–450.0)
Transferrin: 268 mg/dL (ref 212.0–360.0)

## 2023-11-20 LAB — VITAMIN B12: Vitamin B-12: >1500 pg/mL — ABNORMAL HIGH (ref 211–911)

## 2023-11-20 LAB — VITAMIN D 25 HYDROXY (VIT D DEFICIENCY, FRACTURES): VITD: 63.97 ng/mL (ref 30.00–100.00)

## 2023-11-20 LAB — HEMOGLOBIN A1C: Hgb A1c MFr Bld: 6.9 % — ABNORMAL HIGH (ref 4.6–6.5)

## 2023-11-20 MED ORDER — NITROGLYCERIN 0.4 MG SL SUBL: 0.4000 mg | SUBLINGUAL_TABLET | SUBLINGUAL | 3 refills | Status: AC | PRN

## 2023-11-20 MED ORDER — ROSUVASTATIN CALCIUM 40 MG PO TABS
40.0000 mg | ORAL_TABLET | Freq: Every day | ORAL | 3 refills | Status: DC
Start: 2023-11-20 — End: 2024-03-03

## 2023-11-20 NOTE — Assessment & Plan Note (Signed)
 Lab Results  Component Value Date   TSH 3.74 02/01/2022   Stable, pt to continue without med tx for now

## 2023-11-20 NOTE — Assessment & Plan Note (Signed)
 Lab Results  Component Value Date   WBC 8.6 02/01/2022   HGB 10.6 (L) 02/01/2022   HCT 32.8 (L) 02/01/2022   MCV 84.8 02/01/2022   PLT 220.0 02/01/2022   Uncontrolled, etiology unclear, for f/u iron lab today

## 2023-11-20 NOTE — Assessment & Plan Note (Signed)
 BP Readings from Last 3 Encounters:  11/20/23 138/82  03/26/23 118/80  03/20/23 138/60   Stable, pt to continue medical treatment - diet, wt control

## 2023-11-20 NOTE — Assessment & Plan Note (Signed)
 Last vitamin D  Lab Results  Component Value Date   VD25OH 53.50 02/01/2022   Stable, cont oral replacement

## 2023-11-20 NOTE — Progress Notes (Signed)
 Patient ID: Kristopher [name], male   DOB: 03-08-1931, 89 y.o.   MRN: [id number]         Chief Complaint:: wellness exam and dm, ckd3a, anemia, low vit d and b12, htn, hld       HPI:  Kristopher [name] is a 89 y.o. male here for wellness exam; to see optho soon - has appt with new worsening left eye blurry;  for shingrix at pharmacy, o/w up to date                        Also Pt denies chest pain, increased sob or doe, wheezing, orthopnea, PND, increased LE swelling, palpitations, dizziness or syncope.   Pt denies polydipsia, polyuria, or new focal neuro s/s.    Pt denies fever, wt loss, night sweats, loss of appetite, or other constitutional symptoms  No overt bleeding   Wt Readings from Last 3 Encounters:  11/20/23 126 lb 9.6 oz (57.4 kg)  11/15/23 128 lb (58.1 kg)  03/26/23 128 lb 3.2 oz (58.2 kg)   BP Readings from Last 3 Encounters:  11/20/23 138/82  03/26/23 118/80  03/20/23 138/60   Immunization History  Administered Date(s) Administered   Fluad Quad(high Dose 65+) 02/01/2022   Fluad Trivalent(High Dose 65+) 01/24/2023   PFIZER(Purple Top)SARS-COV-2 Vaccination 08/06/2019, 08/26/2019, 07/07/2020   Pneumococcal Conjugate-13 01/15/2014   Pneumococcal Polysaccharide-23 01/26/2016   Tdap 11/30/2017   Zoster, Live 09/16/2012   Health Maintenance Due  Topic Date Due   Zoster Vaccines- Shingrix (1 of 2) 09/24/1949   HEMOGLOBIN A1C  08/03/2022      Past Medical History:  Diagnosis Date   AAA (abdominal aortic aneurysm) (HCC)    ultrasound 07/2011:  2.7 x 2.8 cm   Acute myocardial infarction, unspecified site, episode of care unspecified 1998   Aortic stenosis    echo 4/12: normal LVF, mild LAE, no significant AS   Arthritis    all over (05/21/2017)   CAD (coronary artery disease)    s/p PCI of LAD 1998;  s/p DES to RCA 05/2009;  Myoview  4/13: low risk, no ischemia, EF 56%   CAP (community acquired pneumonia) 05/21/2017   COPD (chronic obstructive pulmonary disease) (HCC)     DJD (degenerative joint disease), lumbar 07/06/2011   Esophageal reflux    HLD (hyperlipidemia)    HTN (hypertension)    PAD (peripheral artery disease) (HCC)    s/p L CIA stent 09/2011 ([facility])   Pneumonia    I've always had pneumonia; since I was 58 months old (05/21/2017)   Renal artery stenosis (HCC)    s/p bilat RA stents   Urine incontinence    Past Surgical History:  Procedure Laterality Date   CATARACT EXTRACTION W/ INTRAOCULAR LENS  IMPLANT, BILATERAL Bilateral YRS AGO   CORONARY ANGIOPLASTY WITH STENT PLACEMENT  05/1996   PCI of LAD; I've got a total of 4 stents (05/21/2017)   HEMORRHOID SURGERY  1954   LOWER EXTREMITY ANGIOGRAM Bilateral 09/20/2011   Procedure: LOWER EXTREMITY ANGIOGRAM;  Surgeon: Deatrice DELENA Cage, MD;  Location: [facility] CATH LAB;  Service: Cardiovascular;  Laterality: Bilateral;   PACEMAKER IMPLANT N/A 12/17/2019   Procedure: PACEMAKER IMPLANT;  Surgeon: Cindie Ole DASEN, MD;  Location: [facility] INVASIVE CV LAB;  Service: Cardiovascular;  Laterality: N/A;   TRANSURETHRAL RESECTION OF PROSTATE  08/11/2011   Procedure: TRANSURETHRAL RESECTION OF THE PROSTATE WITH GYRUS INSTRUMENTS;  Surgeon: Donnice Gwenyth Brooks, MD;  Location: [facility] ORS;  Service:  Urology;  Laterality: N/A;      VASECTOMY  1968    reports that he quit smoking about 35 years ago. His smoking use included cigarettes. He started smoking about 73 years ago. He has a 114 pack-year smoking history. He has never used smokeless tobacco. He reports current alcohol  use of about 1.0 standard drink of alcohol  per week. He reports that he does not use drugs. family history includes Heart disease in his father and mother. Allergies  Allergen Reactions   Influenza Vaccines Other (See Comments)    unknown   Influenza Virus Vaccine Other (See Comments)    Several reaction.  Took it once woke up two days later in the hospital.    Sulfa Antibiotics Rash   Current Outpatient Medications on File Prior to Visit   Medication Sig Dispense Refill   Ascorbic Acid  (VITAMIN C ) 500 MG tablet Take 500 mg by mouth 2 (two) times daily.     aspirin  81 MG tablet Take 1 tablet (81 mg total) by mouth daily.     BEE POLLEN PO Take 1 capsule by mouth 2 (two) times daily.      Cholecalciferol (VITAMIN D3) 50 MCG (2000 UT) TABS Take by mouth.     cyanocobalamin  (VITAMIN B12) 1000 MCG tablet Take 1 tablet (1,000 mcg total) by mouth daily. 90 tablet 3   diclofenac  sodium (VOLTAREN ) 1 % GEL APPLY 2 GRAMS TOPICALLY FOUR TIMES A DAY AS NEEDED. 700 g 4   Docusate Calcium  (STOOL SOFTENER PO) Take 2 capsules by mouth daily.     Multiple Vitamin (MULTIVITAMIN) capsule Take 1 capsule by mouth daily.     Multiple Vitamins-Minerals (PRESERVISION AREDS PO) Take 2 tablets by mouth daily.     NON FORMULARY Superfood daily     omeprazole  (PRILOSEC) 40 MG capsule Take 1 capsule (40 mg total) by mouth in the morning and at bedtime. 180 capsule 3   OVER THE COUNTER MEDICATION Take 2 capsules by mouth every morning. NITRIC OXIDE     SPIRIVA  RESPIMAT 1.25 MCG/ACT AERS USE 2 INHALATIONS DAILY 12 g 3   tamsulosin  (FLOMAX ) 0.4 MG CAPS capsule Take 0.4 mg by mouth daily. (Patient not taking: Reported on 11/20/2023)     No current facility-administered medications on file prior to visit.        ROS:  All others reviewed and negative.  Objective        PE:  BP 138/82   Pulse 85   Ht 5' 8 (1.727 m)   Wt 126 lb 9.6 oz (57.4 kg)   SpO2 96%   BMI 19.25 kg/m                 Constitutional: Pt appears in NAD               HENT: Head: NCAT.                Right Ear: External ear normal.                 Left Ear: External ear normal.                Eyes: . Pupils are equal, round, and reactive to light. Conjunctivae and EOM are normal               Nose: without d/c or deformity               Neck: Neck supple. Gross normal ROM  Cardiovascular: Normal rate and regular rhythm.                 Pulmonary/Chest: Effort normal  and breath sounds without rales or wheezing.                Abd:  Soft, NT, ND, + BS, no organomegaly               Neurological: Pt is alert. At baseline orientation, motor grossly intact               Skin: Skin is warm. No rashes, no other new lesions, LE edema - none               Psychiatric: Pt behavior is normal without agitation   Micro: none  Cardiac tracings I have personally interpreted today:  none  Pertinent Radiological findings (summarize): none   Lab Results  Component Value Date   WBC 8.6 02/01/2022   HGB 10.6 (L) 02/01/2022   HCT 32.8 (L) 02/01/2022   PLT 220.0 02/01/2022   GLUCOSE 121 (H) 02/01/2022   CHOL 125 02/01/2022   TRIG 100.0 02/01/2022   HDL 52.00 02/01/2022   LDLCALC 53 02/01/2022   ALT 16 02/01/2022   AST 28 02/01/2022   NA 139 02/01/2022   K 4.2 02/01/2022   CL 106 02/01/2022   CREATININE 1.54 (H) 02/01/2022   BUN 35 (H) 02/01/2022   CO2 26 02/01/2022   TSH 3.74 02/01/2022   INR 1.01 08/31/2013   HGBA1C 6.8 (H) 02/01/2022   Assessment/Plan:  Bode Pieper Howell is a 89 y.o. White or Caucasian [1] male with  has a past medical history of AAA (abdominal aortic aneurysm) (HCC), Acute myocardial infarction, unspecified site, episode of care unspecified (1998), Aortic stenosis, Arthritis, CAD (coronary artery disease), CAP (community acquired pneumonia) (05/21/2017), COPD (chronic obstructive pulmonary disease) (HCC), DJD (degenerative joint disease), lumbar (07/06/2011), Esophageal reflux, HLD (hyperlipidemia), HTN (hypertension), PAD (peripheral artery disease) (HCC), Pneumonia, Renal artery stenosis (HCC), and Urine incontinence.  Encounter for well adult exam with abnormal findings Age and sex appropriate education and counseling updated with regular exercise and diet Referrals for preventative services - none needed Immunizations addressed - for shingrix at pharmacy Smoking counseling  - none needed Evidence for depression or other mood disorder -  none significant Most recent labs reviewed. I have personally reviewed and have noted: 1) the patient's medical and social history 2) The patient's current medications and supplements 3) The patient's height, weight, and BMI have been recorded in the chart   Vitamin D  deficiency Last vitamin D  Lab Results  Component Value Date   VD25OH 53.50 02/01/2022   Stable, cont oral replacement   Hypothyroidism Lab Results  Component Value Date   TSH 3.74 02/01/2022   Stable, pt to continue without med tx for now   Hyperlipidemia Lab Results  Component Value Date   LDLCALC 53 02/01/2022   Stable, pt to continue current statin crestor  40 mg qd   Essential hypertension BP Readings from Last 3 Encounters:  11/20/23 138/82  03/26/23 118/80  03/20/23 138/60   Stable, pt to continue medical treatment - diet, wt control   Diabetes (HCC) Lab Results  Component Value Date   HGBA1C 6.8 (H) 02/01/2022   Stable, pt to continue current medical treatment  - diet, wt control and f/u lab today   CKD (chronic kidney disease) stage 3, GFR 30-59 ml/min (HCC) Lab Results  Component Value Date   CREATININE 1.54 (  H) 02/01/2022   Stable overall, cont to avoid nephrotoxins   B12 deficiency Lab Results  Component Value Date   VITAMINB12 1,338 (H) 02/01/2022   Stable, cont oral replacement - b12 1000 mcg qd   Anemia Lab Results  Component Value Date   WBC 8.6 02/01/2022   HGB 10.6 (L) 02/01/2022   HCT 32.8 (L) 02/01/2022   MCV 84.8 02/01/2022   PLT 220.0 02/01/2022   Uncontrolled, etiology unclear, for f/u iron lab today  Followup: No follow-ups on file.  Lynwood [name], MD 11/20/2023 10:27 AM [facility]  Primary Care - [facility]

## 2023-11-20 NOTE — Assessment & Plan Note (Signed)
 Lab Results  Component Value Date   CREATININE 1.54 (H) 02/01/2022   Stable overall, cont to avoid nephrotoxins

## 2023-11-20 NOTE — Assessment & Plan Note (Signed)
 Lab Results  Component Value Date   LDLCALC 53 02/01/2022   Stable, pt to continue current statin crestor  40 mg qd

## 2023-11-20 NOTE — Assessment & Plan Note (Signed)

## 2023-11-20 NOTE — Assessment & Plan Note (Signed)
 Lab Results  Component Value Date   VITAMINB12 1,338 (H) 02/01/2022   Stable, cont oral replacement - b12 1000 mcg qd

## 2023-11-20 NOTE — Assessment & Plan Note (Signed)
 Lab Results  Component Value Date   HGBA1C 6.8 (H) 02/01/2022   Stable, pt to continue current medical treatment  - diet, wt control and f/u lab today

## 2023-11-20 NOTE — Patient Instructions (Addendum)
 Please have your Shingrix (shingles) shots done at your local pharmacy.  Please continue all other medications as before, and refills have been done if requested.  Please have the pharmacy call with any other refills you may need.  Please continue your efforts at being more active, low cholesterol diet, and weight control.  You are otherwise up to date with prevention measures today.  Please keep your appointments with your specialists as you may have planned  Please go to the LAB at the blood drawing area for the tests to be done  You will be contacted by phone if any changes need to be made immediately.  Otherwise, you will receive a letter about your results with an explanation, but please check with MyChart first.  Please make an Appointment to return in 6 months, or sooner if needed

## 2023-11-21 DIAGNOSIS — H35412 Lattice degeneration of retina, left eye: Secondary | ICD-10-CM | POA: Diagnosis not present

## 2023-11-21 DIAGNOSIS — H353132 Nonexudative age-related macular degeneration, bilateral, intermediate dry stage: Secondary | ICD-10-CM | POA: Diagnosis not present

## 2023-11-21 DIAGNOSIS — H35373 Puckering of macula, bilateral: Secondary | ICD-10-CM | POA: Diagnosis not present

## 2023-11-21 DIAGNOSIS — T8522XA Displacement of intraocular lens, initial encounter: Secondary | ICD-10-CM | POA: Diagnosis not present

## 2023-11-21 DIAGNOSIS — H43813 Vitreous degeneration, bilateral: Secondary | ICD-10-CM | POA: Diagnosis not present

## 2023-11-29 NOTE — Telephone Encounter (Signed)
 Letter printed and faxed

## 2023-12-12 ENCOUNTER — Other Ambulatory Visit: Payer: Self-pay | Admitting: Emergency Medicine

## 2023-12-12 NOTE — Telephone Encounter (Signed)
 Dr. Shelah please advise if OK to refill Omeprazole 

## 2023-12-21 ENCOUNTER — Ambulatory Visit (INDEPENDENT_AMBULATORY_CARE_PROVIDER_SITE_OTHER)

## 2023-12-21 DIAGNOSIS — I442 Atrioventricular block, complete: Secondary | ICD-10-CM

## 2023-12-23 ENCOUNTER — Emergency Department (HOSPITAL_COMMUNITY)
Admission: EM | Admit: 2023-12-23 | Discharge: 2023-12-23 | Disposition: A | Discharge status: Home / Self Care | Attending: Emergency Medicine | Admitting: Emergency Medicine

## 2023-12-23 ENCOUNTER — Other Ambulatory Visit: Payer: Self-pay

## 2023-12-23 ENCOUNTER — Emergency Department (HOSPITAL_COMMUNITY)

## 2023-12-23 DIAGNOSIS — W06XXXA Fall from bed, initial encounter: Secondary | ICD-10-CM | POA: Diagnosis not present

## 2023-12-23 DIAGNOSIS — N183 Chronic kidney disease, stage 3 unspecified: Secondary | ICD-10-CM | POA: Insufficient documentation

## 2023-12-23 DIAGNOSIS — S42215A Unspecified nondisplaced fracture of surgical neck of left humerus, initial encounter for closed fracture: Secondary | ICD-10-CM | POA: Diagnosis not present

## 2023-12-23 DIAGNOSIS — Z87891 Personal history of nicotine dependence: Secondary | ICD-10-CM | POA: Insufficient documentation

## 2023-12-23 DIAGNOSIS — Z7982 Long term (current) use of aspirin: Secondary | ICD-10-CM | POA: Insufficient documentation

## 2023-12-23 DIAGNOSIS — I129 Hypertensive chronic kidney disease with stage 1 through stage 4 chronic kidney disease, or unspecified chronic kidney disease: Secondary | ICD-10-CM | POA: Insufficient documentation

## 2023-12-23 DIAGNOSIS — J449 Chronic obstructive pulmonary disease, unspecified: Secondary | ICD-10-CM | POA: Diagnosis not present

## 2023-12-23 DIAGNOSIS — I2510 Atherosclerotic heart disease of native coronary artery without angina pectoris: Secondary | ICD-10-CM | POA: Diagnosis not present

## 2023-12-23 DIAGNOSIS — I700 Atherosclerosis of aorta: Secondary | ICD-10-CM | POA: Diagnosis not present

## 2023-12-23 DIAGNOSIS — E1122 Type 2 diabetes mellitus with diabetic chronic kidney disease: Secondary | ICD-10-CM | POA: Insufficient documentation

## 2023-12-23 DIAGNOSIS — S4992XA Unspecified injury of left shoulder and upper arm, initial encounter: Secondary | ICD-10-CM | POA: Diagnosis present

## 2023-12-23 NOTE — ED Triage Notes (Signed)
 Patient to ED by POV with c/o arm pain after falling out of bed last night. He takes an ASA 81mg  daily denies LOC.

## 2023-12-23 NOTE — ED Provider Notes (Signed)
  [facility] AT [facility] Provider Note  CSN: [id number] Arrival date & time: 12/23/23 1227  Chief Complaint(s) Fall and Shoulder Injury  HPI Kristopher [name] is a 89 y.o. male history of CAD, COPD, hypertension, blood anemia presenting with left shoulder pain.  Reports he fell out of bed, landed on left shoulder.  Denies any pain elsewhere.  Had a sling at home and has been wearing this.  Has not taken anything for pain and has not wanted to take anything for pain.  Denies any numbness or tingling.  No other injuries.  Has been ambulatory.  Lives at home but daughter is helping him.  He denies any head injury, headaches, nausea, vomiting, difficulty breathing   Past Medical History Past Medical History:  Diagnosis Date   AAA (abdominal aortic aneurysm) (HCC)    ultrasound 07/2011:  2.7 x 2.8 cm   Acute myocardial infarction, unspecified site, episode of care unspecified 1998   Aortic stenosis    echo 4/12: normal LVF, mild LAE, no significant AS   Arthritis    all over (05/21/2017)   CAD (coronary artery disease)    s/p PCI of LAD 1998;  s/p DES to RCA 05/2009;  Myoview  4/13: low risk, no ischemia, EF 56%   CAP (community acquired pneumonia) 05/21/2017   COPD (chronic obstructive pulmonary disease) (HCC)    DJD (degenerative joint disease), lumbar 07/06/2011   Esophageal reflux    HLD (hyperlipidemia)    HTN (hypertension)    PAD (peripheral artery disease) (HCC)    s/p L CIA stent 09/2011 ([facility])   Pneumonia    I've always had pneumonia; since I was 6 months old (05/21/2017)   Renal artery stenosis (HCC)    s/p bilat RA stents   Urine incontinence    Patient Active Problem List   Diagnosis Date Noted   Anemia 11/20/2023   B12 deficiency 02/01/2022   Pulmonary nodules 09/13/2021   Productive cough 08/02/2021   Weight loss 08/02/2021   Vitamin D  deficiency 01/27/2021   Chronic hypoxemic respiratory failure (HCC) 11/29/2020   Hypotension  08/01/2020   Hypothyroidism 08/01/2020   CKD (chronic kidney disease) stage 3, GFR 30-59 ml/min (HCC) 02/01/2020   Pacemaker 12/17/2019   Complete heart block (HCC) 12/17/2019   CHB (complete heart block) (HCC) 12/16/2019   Bradycardia 12/12/2019   Weakness 12/03/2019   Insect bite 09/20/2018   Basal cell carcinoma (BCC) of left hand 02/22/2018   Diabetes (HCC) 11/30/2017   Skin lesion 11/30/2017   CAP (community acquired pneumonia) 05/21/2017   Acute kidney injury (HCC) 05/21/2017   Acute respiratory failure (HCC) 05/21/2017   Sepsis (HCC) 05/21/2017   Finger swelling 12/18/2016   Paronychia of left middle finger 12/10/2016   Spinal stenosis, lumbar region, with neurogenic claudication 08/31/2014   Right lumbar radiculitis 08/04/2014   Chronic low back pain 07/02/2014   RBBB 06/18/2014   Patellofemoral arthralgia of right knee 03/24/2014   Osteoarthritis of right hip 03/24/2014   Dupuytren's contracture of both hands 03/24/2014   Ankle impingement syndrome 11/06/2013   Liver mass 10/21/2013   Low back pain 08/31/2013   AV block, 2nd degree 08/31/2013   Chronic pain syndrome 04/17/2013   Right hand pain 07/11/2012   Peripheral artery disease (HCC) 10/04/2011   COPD (chronic obstructive pulmonary disease) (HCC) 07/06/2011   DJD (degenerative joint disease), lumbar 07/06/2011   Encounter for well adult exam with abnormal findings 07/06/2011   Abdominal pain 05/25/2011   Pancreatitis  05/24/2011   Abdominal aortic aneurysm (HCC) 06/13/2010   Hyperlipidemia 05/18/2009   Essential hypertension 05/18/2009   Acute myocardial infarction [facility]) 05/18/2009   CAD S/P LAD PCI 1998, RCA DES 2011 05/18/2009   Aortic valve disorder 05/18/2009   RENAL ARTERY STENOSIS 05/18/2009   Peripheral vascular disease (HCC) 05/18/2009   GERD 05/18/2009   DOE (dyspnea on exertion) 05/18/2009   Home Medication(s) Prior to Admission medications   Medication Sig Start Date End Date Taking?  Authorizing Provider  Ascorbic Acid  (VITAMIN C ) 500 MG tablet Take 500 mg by mouth 2 (two) times daily.    [provider]  aspirin  81 MG tablet Take 1 tablet (81 mg total) by mouth daily. 06/25/18   Pietro Redell RAMAN, MD  BEE POLLEN PO Take 1 capsule by mouth 2 (two) times daily.     [provider]  Cholecalciferol (VITAMIN D3) 50 MCG (2000 UT) TABS Take by mouth.    [provider]  cyanocobalamin  (VITAMIN B12) 1000 MCG tablet Take 1 tablet (1,000 mcg total) by mouth daily. 02/01/22   Norleen Lynwood ORN, MD  diclofenac  sodium (VOLTAREN ) 1 % GEL APPLY 2 GRAMS TOPICALLY FOUR TIMES A DAY AS NEEDED. 02/13/18   Debby Fidela CROME, NP  Docusate Calcium  (STOOL SOFTENER PO) Take 2 capsules by mouth daily.    [provider]  Multiple Vitamin (MULTIVITAMIN) capsule Take 1 capsule by mouth daily.    [provider]  Multiple Vitamins-Minerals (PRESERVISION AREDS PO) Take 2 tablets by mouth daily.    [provider]  nitroGLYCERIN  (NITROSTAT ) 0.4 MG SL tablet Place 1 tablet (0.4 mg total) under the tongue every 5 (five) minutes as needed for chest pain. 11/20/23   Norleen Lynwood ORN, MD  NON FORMULARY Superfood daily    [provider]  omeprazole  (PRILOSEC) 40 MG capsule TAKE 1 CAPSULE IN THE MORNING AND AT BEDTIME 12/12/23   Shelah Lamar RAMAN, MD  OVER THE COUNTER MEDICATION Take 2 capsules by mouth every morning. NITRIC OXIDE    [provider]  rosuvastatin  (CRESTOR ) 40 MG tablet Take 1 tablet (40 mg total) by mouth daily. 11/20/23   Norleen Lynwood ORN, MD  SPIRIVA  RESPIMAT 1.25 MCG/ACT AERS USE 2 INHALATIONS DAILY 01/01/23   Byrum, Robert S, MD  tamsulosin  (FLOMAX ) 0.4 MG CAPS capsule Take 0.4 mg by mouth daily. Patient not taking: Reported on 11/20/2023 01/29/22   [provider]                                                                                                                                    Past Surgical History Past Surgical  History:  Procedure Laterality Date   CATARACT EXTRACTION W/ INTRAOCULAR LENS  IMPLANT, BILATERAL Bilateral YRS AGO   CORONARY ANGIOPLASTY WITH STENT PLACEMENT  05/1996   PCI of LAD; I've got a total of 4 stents (05/21/2017)   HEMORRHOID SURGERY  1954  LOWER EXTREMITY ANGIOGRAM Bilateral 09/20/2011   Procedure: LOWER EXTREMITY ANGIOGRAM;  Surgeon: Deatrice DELENA Cage, MD;  Location: [facility] CATH LAB;  Service: Cardiovascular;  Laterality: Bilateral;   PACEMAKER IMPLANT N/A 12/17/2019   Procedure: PACEMAKER IMPLANT;  Surgeon: Cindie Ole DASEN, MD;  Location: [facility] INVASIVE CV LAB;  Service: Cardiovascular;  Laterality: N/A;   TRANSURETHRAL RESECTION OF PROSTATE  08/11/2011   Procedure: TRANSURETHRAL RESECTION OF THE PROSTATE WITH GYRUS INSTRUMENTS;  Surgeon: Donnice Gwenyth Brooks, MD;  Location: [facility] ORS;  Service: Urology;  Laterality: N/A;      VASECTOMY  1968   Family History Family History  Problem Relation Age of Onset   Heart disease Mother    Heart disease Father     Social History Social History   Tobacco Use   Smoking status: Former    Current packs/day: 0.00    Average packs/day: 3.0 packs/day for 38.0 years (114.0 ttl pk-yrs)    Types: Cigarettes    Start date: 04/10/1950    Quit date: 04/10/1988    Years since quitting: 35.7   Smokeless tobacco: Never  Vaping Use   Vaping status: Never Used  Substance Use Topics   Alcohol  use: Yes    Alcohol /week: 1.0 standard drink of alcohol     Types: 1 Cans of beer per week    Comment: only on holidays   Drug use: No   Allergies Influenza vaccines, Influenza virus vaccine, and Sulfa antibiotics  Review of Systems Review of Systems  All other systems reviewed and are negative.   Physical Exam Vital Signs  I have reviewed the triage vital signs BP 135/85 (BP Location: Right Arm)   Pulse (!) 108   Temp 97.7 F (36.5 C) (Oral)   Resp 14   Ht 5' 8 (1.727 m)   Wt 56.7 kg   SpO2 93%   BMI 19.01 kg/m  Physical Exam Vitals and  nursing note reviewed.  Constitutional:      General: He is not in acute distress.    Appearance: Normal appearance.  HENT:     Head: Normocephalic and atraumatic.     Mouth/Throat:     Mouth: Mucous membranes are moist.  Eyes:     Conjunctiva/sclera: Conjunctivae normal.  Cardiovascular:     Rate and Rhythm: Normal rate and regular rhythm.  Pulmonary:     Effort: Pulmonary effort is normal. No respiratory distress.     Breath sounds: Normal breath sounds.  Abdominal:     General: Abdomen is flat.     Palpations: Abdomen is soft.     Tenderness: There is no abdominal tenderness.  Musculoskeletal:     Comments: No midline C, T, L-spine tenderness.  Full active range of motion of the bilateral lower extremities in the right upper extremity without focal tenderness or deformity.  Left upper extremity with tenderness around the left shoulder, no tenderness over the clavicle or distal humerus, elbow, wrist, hand.  No deformity but some swelling around the shoulder.  Skin:    General: Skin is warm and dry.     Capillary Refill: Capillary refill takes less than 2 seconds.  Neurological:     Mental Status: He is alert and oriented to person, place, and time. Mental status is at baseline.  Psychiatric:        Mood and Affect: Mood normal.        Behavior: Behavior normal.     ED Results and Treatments Labs (all labs ordered are listed, but only abnormal  results are displayed) Labs Reviewed - No data to display                                                                                                                        Radiology No results found.  Pertinent labs & imaging results that were available during my care of the patient were reviewed by me and considered in my medical decision making (see MDM for details).  Medications Ordered in ED Medications - No data to display                                                                                                                                    Procedures Procedures  (including critical care time)  Medical Decision Making / ED Course   MDM:  89 year old presenting to the emergency department with fall out of bed.  Patient is overall quite well-appearing especially for 89 year old.  He does have some swelling and tenderness around the left shoulder.  X-ray on my interpretation does show a nondisplaced humerus fracture of the left shoulder through the surgical neck.  Will provide new sling as his sling is old and he request new sling.  He really does not want any medication for pain at this time.  Recommended outpatient follow-up with orthopedic surgery.  No evidence of any other injuries.  He denies head injury.  He denies headache.  Feel patient is stable for discharge at this time.  Plan discussed with patient and daughter. Will discharge patient to home. All questions answered. Patient comfortable with plan of discharge. Return precautions discussed with patient and specified on the after visit summary.       Additional history obtained: -Additional history obtained from family    Medicines ordered and prescription drug management: No orders of the defined types were placed in this encounter.   -I have reviewed the patients home medicines and have made adjustments as needed  Co morbidities that complicate the patient evaluation  Past Medical History:  Diagnosis Date   AAA (abdominal aortic aneurysm) (HCC)    ultrasound 07/2011:  2.7 x 2.8 cm   Acute myocardial infarction, unspecified site, episode of care unspecified 1998   Aortic stenosis    echo 4/12: normal LVF, mild LAE, no significant AS   Arthritis    all over (05/21/2017)   CAD (coronary artery disease)    s/p PCI of LAD 1998;  s/p DES to RCA 05/2009;  Myoview  4/13: low risk, no ischemia, EF 56%   CAP (community acquired pneumonia) 05/21/2017   COPD (chronic obstructive pulmonary disease) (HCC)    DJD (degenerative joint  disease), lumbar 07/06/2011   Esophageal reflux    HLD (hyperlipidemia)    HTN (hypertension)    PAD (peripheral artery disease) (HCC)    s/p L CIA stent 09/2011 ([facility])   Pneumonia    I've always had pneumonia; since I was 78 months old (05/21/2017)   Renal artery stenosis (HCC)    s/p bilat RA stents   Urine incontinence       Dispostion: Disposition decision including need for hospitalization was considered, and patient discharged from emergency department.    Final Clinical Impression(s) / ED Diagnoses Final diagnoses:  Traumatic closed nondisplaced fracture of surgical neck of left humerus, initial encounter     This chart was dictated using voice recognition software.  Despite best efforts to proofread,  errors can occur which can change the documentation meaning.    Francesca Elsie CROME, MD 12/23/23 1536

## 2023-12-23 NOTE — Discharge Instructions (Signed)
 We evaluated you for your fall.  Your x-ray shows that you have a fracture to your left humerus, a bone of your left shoulder.  This type of fracture does not normally need surgery.  We have given you a new sling.  Please take 1000 mg of Tylenol  every 6 hours as needed for pain.  You can also apply heat or ice for pain and swelling.  Please follow-up with Dr. Sherida for orthopedic surgery follow-up.  Please return if you develop any new symptoms such as pain in any new locations, lightheadedness or dizziness, chest pain, difficulty breathing, or any other new symptoms

## 2023-12-25 ENCOUNTER — Ambulatory Visit
Admission: RE | Admit: 2023-12-25 | Discharge: 2023-12-25 | Disposition: A | Discharge status: Home / Self Care | Source: Ambulatory Visit | Attending: Emergency Medicine | Admitting: Emergency Medicine

## 2023-12-25 ENCOUNTER — Other Ambulatory Visit: Payer: Self-pay | Admitting: Emergency Medicine

## 2023-12-25 DIAGNOSIS — J432 Centrilobular emphysema: Secondary | ICD-10-CM | POA: Diagnosis not present

## 2023-12-25 DIAGNOSIS — S42202A Unspecified fracture of upper end of left humerus, initial encounter for closed fracture: Secondary | ICD-10-CM | POA: Diagnosis not present

## 2023-12-25 DIAGNOSIS — R918 Other nonspecific abnormal finding of lung field: Secondary | ICD-10-CM

## 2023-12-26 LAB — CUP PACEART REMOTE DEVICE CHECK
Battery Remaining Longevity: 126 mo
Battery Remaining Percentage: 100 %
Brady Statistic RA Percent Paced: 3 %
Brady Statistic RV Percent Paced: 87 %
Date Time Interrogation Session: 20250912010100
Implantable Lead Connection Status: 753985
Implantable Lead Connection Status: 753985
Implantable Lead Implant Date: 20210908
Implantable Lead Implant Date: 20210908
Implantable Lead Location: 753859
Implantable Lead Location: 753860
Implantable Lead Model: 7841
Implantable Lead Model: 7842
Implantable Lead Serial Number: 1054557
Implantable Lead Serial Number: 1096552
Implantable Pulse Generator Implant Date: 20210908
Lead Channel Impedance Value: 678 Ohm
Lead Channel Impedance Value: 821 Ohm
Lead Channel Pacing Threshold Amplitude: 0.6 V
Lead Channel Pacing Threshold Amplitude: 0.7 V
Lead Channel Pacing Threshold Pulse Width: 0.4 ms
Lead Channel Pacing Threshold Pulse Width: 0.4 ms
Lead Channel Setting Pacing Amplitude: 1.1 V
Lead Channel Setting Pacing Amplitude: 2.5 V
Lead Channel Setting Pacing Pulse Width: 0.4 ms
Lead Channel Setting Sensing Sensitivity: 3 mV
Pulse Gen Serial Number: 948694
Zone Setting Status: 755011

## 2023-12-27 ENCOUNTER — Ambulatory Visit: Payer: Self-pay | Admitting: Cardiology

## 2023-12-28 NOTE — Progress Notes (Signed)
 Remote PPM Transmission

## 2024-01-15 DIAGNOSIS — S42295D Other nondisplaced fracture of upper end of left humerus, subsequent encounter for fracture with routine healing: Secondary | ICD-10-CM | POA: Diagnosis not present

## 2024-01-17 DIAGNOSIS — R030 Elevated blood-pressure reading, without diagnosis of hypertension: Secondary | ICD-10-CM | POA: Diagnosis not present

## 2024-01-17 DIAGNOSIS — Z8249 Family history of ischemic heart disease and other diseases of the circulatory system: Secondary | ICD-10-CM | POA: Diagnosis not present

## 2024-01-17 DIAGNOSIS — R269 Unspecified abnormalities of gait and mobility: Secondary | ICD-10-CM | POA: Diagnosis not present

## 2024-01-17 DIAGNOSIS — J449 Chronic obstructive pulmonary disease, unspecified: Secondary | ICD-10-CM | POA: Diagnosis not present

## 2024-01-17 DIAGNOSIS — E119 Type 2 diabetes mellitus without complications: Secondary | ICD-10-CM | POA: Diagnosis not present

## 2024-01-17 DIAGNOSIS — K219 Gastro-esophageal reflux disease without esophagitis: Secondary | ICD-10-CM | POA: Diagnosis not present

## 2024-01-17 DIAGNOSIS — I2510 Atherosclerotic heart disease of native coronary artery without angina pectoris: Secondary | ICD-10-CM | POA: Diagnosis not present

## 2024-01-17 DIAGNOSIS — I700 Atherosclerosis of aorta: Secondary | ICD-10-CM | POA: Diagnosis not present

## 2024-01-17 DIAGNOSIS — E785 Hyperlipidemia, unspecified: Secondary | ICD-10-CM | POA: Diagnosis not present

## 2024-02-05 DIAGNOSIS — S42202D Unspecified fracture of upper end of left humerus, subsequent encounter for fracture with routine healing: Secondary | ICD-10-CM | POA: Diagnosis not present

## 2024-03-03 ENCOUNTER — Other Ambulatory Visit: Payer: Self-pay

## 2024-03-03 ENCOUNTER — Other Ambulatory Visit: Payer: Self-pay | Admitting: Internal Medicine

## 2024-03-03 DIAGNOSIS — E7800 Pure hypercholesterolemia, unspecified: Secondary | ICD-10-CM

## 2024-03-21 ENCOUNTER — Ambulatory Visit

## 2024-03-21 DIAGNOSIS — I442 Atrioventricular block, complete: Secondary | ICD-10-CM | POA: Diagnosis not present

## 2024-03-22 LAB — CUP PACEART REMOTE DEVICE CHECK
Battery Remaining Longevity: 120 mo
Battery Remaining Percentage: 100 %
Brady Statistic RA Percent Paced: 3 %
Brady Statistic RV Percent Paced: 86 %
Date Time Interrogation Session: 20251212010200
Implantable Lead Connection Status: 753985
Implantable Lead Connection Status: 753985
Implantable Lead Implant Date: 20210908
Implantable Lead Implant Date: 20210908
Implantable Lead Location: 753859
Implantable Lead Location: 753860
Implantable Lead Model: 7841
Implantable Lead Model: 7842
Implantable Lead Serial Number: 1054557
Implantable Lead Serial Number: 1096552
Implantable Pulse Generator Implant Date: 20210908
Lead Channel Impedance Value: 680 Ohm
Lead Channel Impedance Value: 811 Ohm
Lead Channel Pacing Threshold Amplitude: 0.6 V
Lead Channel Pacing Threshold Amplitude: 1 V
Lead Channel Pacing Threshold Pulse Width: 0.4 ms
Lead Channel Pacing Threshold Pulse Width: 0.4 ms
Lead Channel Setting Pacing Amplitude: 1.5 V
Lead Channel Setting Pacing Amplitude: 2.5 V
Lead Channel Setting Pacing Pulse Width: 0.4 ms
Lead Channel Setting Sensing Sensitivity: 3 mV
Pulse Gen Serial Number: 948694
Zone Setting Status: 755011

## 2024-03-27 NOTE — Progress Notes (Signed)
 Remote PPM Transmission

## 2024-04-07 ENCOUNTER — Ambulatory Visit: Payer: Self-pay | Admitting: Cardiovascular Disease

## 2024-05-22 ENCOUNTER — Ambulatory Visit: Admitting: Internal Medicine

## 2024-06-20 ENCOUNTER — Encounter

## 2024-09-19 ENCOUNTER — Encounter

## 2024-11-20 ENCOUNTER — Encounter: Admitting: Internal Medicine

## 2024-11-21 ENCOUNTER — Ambulatory Visit

## 2024-12-19 ENCOUNTER — Encounter
